# Patient Record
Sex: Male | Born: 1950
Health system: Southern US, Community
[De-identification: ages and names within clinical notes are randomized; demographics above are authoritative.]

## PROBLEM LIST (undated history)

## (undated) DIAGNOSIS — I10 Essential (primary) hypertension: Secondary | ICD-10-CM

## (undated) DIAGNOSIS — R0981 Nasal congestion: Secondary | ICD-10-CM

## (undated) DIAGNOSIS — R112 Nausea with vomiting, unspecified: Secondary | ICD-10-CM

## (undated) DIAGNOSIS — C787 Secondary malignant neoplasm of liver and intrahepatic bile duct: Principal | ICD-10-CM

## (undated) DIAGNOSIS — Z9889 Other specified postprocedural states: Secondary | ICD-10-CM

## (undated) DIAGNOSIS — Z7189 Other specified counseling: Secondary | ICD-10-CM

## (undated) DIAGNOSIS — Z9221 Personal history of antineoplastic chemotherapy: Secondary | ICD-10-CM

## (undated) DIAGNOSIS — C189 Malignant neoplasm of colon, unspecified: Principal | ICD-10-CM

## (undated) HISTORY — DX: Other specified counseling: Z71.89

## (undated) HISTORY — PX: OTHER SURGICAL HISTORY: SHX169

## (undated) HISTORY — DX: Secondary malignant neoplasm of liver and intrahepatic bile duct: C78.7

## (undated) HISTORY — DX: Malignant neoplasm of colon, unspecified: C18.9

---

## 1998-02-09 ENCOUNTER — Ambulatory Visit (HOSPITAL_BASED_OUTPATIENT_CLINIC_OR_DEPARTMENT_OTHER): Admission: RE | Admit: 1998-02-09 | Discharge: 1998-02-09 | Payer: Self-pay | Admitting: Surgery

## 2001-06-04 ENCOUNTER — Emergency Department (HOSPITAL_COMMUNITY): Admission: EM | Admit: 2001-06-04 | Discharge: 2001-06-04 | Payer: Self-pay | Admitting: Emergency Medicine

## 2001-06-04 ENCOUNTER — Encounter: Payer: Self-pay | Admitting: Emergency Medicine

## 2013-05-18 ENCOUNTER — Ambulatory Visit: Payer: BC Managed Care – PPO

## 2013-05-18 ENCOUNTER — Other Ambulatory Visit (HOSPITAL_BASED_OUTPATIENT_CLINIC_OR_DEPARTMENT_OTHER): Payer: BC Managed Care – PPO | Admitting: Lab

## 2013-05-18 ENCOUNTER — Ambulatory Visit (HOSPITAL_BASED_OUTPATIENT_CLINIC_OR_DEPARTMENT_OTHER): Payer: BC Managed Care – PPO | Admitting: Hematology & Oncology

## 2013-05-18 VITALS — BP 136/84 | HR 86 | Temp 98.5°F | Resp 18 | Ht 65.0 in | Wt 157.0 lb

## 2013-05-18 DIAGNOSIS — C787 Secondary malignant neoplasm of liver and intrahepatic bile duct: Secondary | ICD-10-CM

## 2013-05-18 DIAGNOSIS — C189 Malignant neoplasm of colon, unspecified: Secondary | ICD-10-CM

## 2013-05-18 DIAGNOSIS — C801 Malignant (primary) neoplasm, unspecified: Secondary | ICD-10-CM

## 2013-05-18 LAB — CBC WITH DIFFERENTIAL (CANCER CENTER ONLY)
Eosinophils Absolute: 0.1 10*3/uL (ref 0.0–0.5)
HCT: 42.8 % (ref 38.7–49.9)
HGB: 14.5 g/dL (ref 13.0–17.1)
LYMPH#: 0.9 10*3/uL (ref 0.9–3.3)
LYMPH%: 9.6 % — ABNORMAL LOW (ref 14.0–48.0)
MCV: 89 fL (ref 82–98)
MONO#: 0.8 10*3/uL (ref 0.1–0.9)
NEUT%: 80.8 % — ABNORMAL HIGH (ref 40.0–80.0)
RBC: 4.82 10*6/uL (ref 4.20–5.70)
RDW: 12.4 % (ref 11.1–15.7)
WBC: 9.6 10*3/uL (ref 4.0–10.0)

## 2013-05-18 LAB — CMP (CANCER CENTER ONLY)
ALT(SGPT): 26 U/L (ref 10–47)
AST: 49 U/L — ABNORMAL HIGH (ref 11–38)
Albumin: 3.5 g/dL (ref 3.3–5.5)
Alkaline Phosphatase: 258 U/L — ABNORMAL HIGH (ref 26–84)
BUN, Bld: 14 mg/dL (ref 7–22)
Calcium: 9.4 mg/dL (ref 8.0–10.3)
Chloride: 98 mEq/L (ref 98–108)
Glucose, Bld: 104 mg/dL (ref 73–118)
Potassium: 4.3 mEq/L (ref 3.3–4.7)
Sodium: 139 mEq/L (ref 128–145)
Total Protein: 8.3 g/dL — ABNORMAL HIGH (ref 6.4–8.1)

## 2013-05-18 MED ORDER — METOCLOPRAMIDE HCL 10 MG PO TABS: 10.0000 mg | ORAL_TABLET | Freq: Three times a day (TID) | ORAL | Status: DC

## 2013-05-18 NOTE — Progress Notes (Signed)
This office note has been dictated.

## 2013-05-19 LAB — CEA: CEA: 3455.2 ng/mL — ABNORMAL HIGH (ref 0.0–5.0)

## 2013-05-19 LAB — LACTATE DEHYDROGENASE: LDH: 690 U/L — ABNORMAL HIGH (ref 94–250)

## 2013-05-19 NOTE — Progress Notes (Signed)
CC:   Ryan Maynard, M.D.  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  HISTORY OF PRESENT ILLNESS:  Ryan Maynard is a very nice 62 year old white gentleman.  He is followed by Dr. Jacky Kindle.  Ryan Maynard has been very healthy in his whole life.  He really has had no medical issues.  He is working.  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  He has been exercising.  By about 3 months ago, he had a bout of shingles.  This was in the right T4-5 dermatome.  It is very painful.  He was put on some Valtrex.  He had a lot of postherpetic neuralgia.  This is slowly getting better on Neurontin now.  He then began to have a slight decrease in appetite.  He became more bloated.  He gets full very easily.  He was losing a little bit of weight.  He was not noticing any change in his bowels or bladder.  Again, he was not going to have bowel movements as he normally had.  He subsequently went to Dr. Jacky Kindle.  Dr. Jacky Kindle obviously knew something was wrong when he examined him as his liver was incredibly enlarged.  Dr. Jacky Kindle then sent Ryan Maynard over for a CT scan.  This was done locally at [facility].  Unfortunately, the CT scan showed extensive hepatic metastasis.  He had hepatomegaly.  He has paracaval lymph nodes. He has some pericardiac lymph nodes.  The pancreas looked okay. Gallbladder looked okay.  There was stool in the colon without any obvious mass.  Dr. Jacky Kindle kindly referred Ryan Maynard to the [facility].  Ryan Maynard still looks pretty good.  He is just shocked as to what is going on with him.  His wife is with him.  She actually has hepatitis C, but this is pretty quiet domain.  Ryan Maynard has had maybe some slight fevers.  He may have had some sweats.  Again, he has had no cough.  He has had no shortness of breath. He has had no leg swelling.  He has not noted any rashes.  Overall, his performance status is ECOG 1.  PAST MEDICAL HISTORY:   Remarkable only for hypertension.  ALLERGIES:  None.  MEDICATIONS: 1. Hydrochlorothiazide 12.5 mg p.o. daily. 2. Lotrel (5/20) 1 p.o. daily.  SOCIAL HISTORY:  Negative for tobacco use.  There is rare alcohol use. Again, he is working without any difficulties.  He has no obvious exposures.  FAMILY HISTORY:  Unremarkable.  He does not have any siblings.  REVIEW OF SYSTEMS:  As stated in history of present illness.  No additional findings are noted on a 12-system review.  PHYSICAL EXAMINATION:  General:  This is a fairly well-developed and well-nourished white gentleman, in no obvious distress.  He is alert and oriented x3.  Vital Signs:  Temperature of 98.5, pulse 86, respiratory rate 18, blood pressure 136/84.  Weight is 157 pounds.  Head and Neck: Normocephalic, atraumatic skull.  There are no ocular or oral lesions. There are no palpable, cervical, or supraclavicular lymph nodes.  Lungs: His lungs are clear bilaterally.  There are no rales, wheezes, or rhonchi.  Cardiac:  Regular rate and rhythm with a normal S1, S2.  There are no murmurs, rubs, or bruits.  Abdomen:  Soft.  Liver is incredibly distended.  His liver extends probably about 7 cm below the right costal margin.  The liver extends all the way across the midline to the  left costal margin.  Liver is firm.  There is no ascites.  There is no guarding or rebound tenderness.  Back:  No tenderness over the spine, ribs, or hips.  Extremities:  No clubbing, cyanosis, or edema.  He has good range motion of his joints.  He has good strength.  Skin:  No rashes, ecchymosis, or petechiae.  Neurological:  No focal neurological deficits.  LABORATORY STUDIES:  White cell count is 9.6, hemoglobin 13.5, hematocrit 43.8, platelet count 350.  His calcium is 9.4 with an albumin of 3.5.  His total protein is 8.3.  Alkaline phosphatase is 258. Bilirubin is 0.8.  IMPRESSION:  Ryan Maynard is a nice 62 year old gentleman.  He has extensive  hepatomegaly with metastasis.  The primary site at this point of time is unknown.  I would feel that metastatic colon cancer would be a high likelihood. Unfortunately, he has never had a colonoscopy.  He definitely needs one now.  One would think that if he had colon cancer, that he would be anemic. He certainly is not anemic.  I have already spoke with  Dr. Molly Maduro Bruschini.  Dr. Charlton Amor will get him in this week and we will plan for colonoscopy and possible upper endoscopy.  If his endoscopic procedures are negative, then we are going to have to do a biopsy of one of the liver mets to see exactly what this might be.  Then, I think it would be unusual if this would be a malignancy for another part of the GI tract.  Lung cancer will be a possibility.  He has not smoked for 40 years. Nothing was seen on the bases of the lungs on his recent CT scan.  He has no pulmonary type symptoms.  A hematologic malignancy would be highly unusual.  Lymphoma and Hodgkin disease, however, is certainly be possible.  Small cell or neuroendocrine tumor, I guess would be considered.  We will see what his CEA level is.  Ultimately, this is going to come down to a biopsy to confirm the histology.  Apparently, I talked to Ryan Maynard and his wife.  He is in good shape. If this is a metastatic colon cancer, he certainly would be able to tolerate chemotherapy.  I think that he would have a good chance of responding to chemotherapy.  If he did respond to chemotherapy, then he certainly would have a much better quality and quantity of life.  I told [name] that if he did not wish to have therapy or if the therapy did not work, then he likely was not looking at more than 3 or 4 months.  Of course, this is predicted on the basis of him having colon cancer.  I think any other cancer, if he did not respond to therapy, would be even worse.  I told Ryan Maynard and his wife that if he did  respond to treatment that he might be able to go a year and half to possibly 2 years.  I think the key with Ryan Maynard is his extensive tumor burden.  His liver is having a tough time.  His liver tests has not looked all that bad get, but Ryan Maynard is symptomatic.  I think a lot of his symptoms are referable to his hepatomegaly.  I am going to put him on some Reglan and we will see if this helps with some of the digestive issues.  I told him to take over-the-counter Pepcid at 2 pills a  day to see if this has not helped with stomach acid build up.  I spent a hour and half with Ryan Maynard and his wife.  I went over the CAT scans with him about the discovery from the [facility].  I did saw how bad his liver was and it will problem that we have with this malignancy.  Again, we need to get a biopsy.  Ryan Maynard, at first, did not want to do anything.  However, I told that because of his good performance status, he would have a good chance of prolong his life if he responded to chemotherapy.  After talking to him about this, he has decided to try a colonoscopy.  Again, if the colonoscopy is negative, then we will need to biopsy one of these liver lesions.  We will see what his CEA level is.  I will plan to get Ryan Maynard back depending on what we find with our endoscopy and biopsies.    ______________________________ Josph Macho, M.D. PRE/MEDQ  D:  05/18/2013  T:  05/19/2013  Job:  [id number]

## 2013-05-21 ENCOUNTER — Other Ambulatory Visit: Payer: Self-pay | Admitting: *Deleted

## 2013-05-21 DIAGNOSIS — C189 Malignant neoplasm of colon, unspecified: Secondary | ICD-10-CM

## 2013-05-21 MED ORDER — METOCLOPRAMIDE HCL 10 MG PO TABS: 10.0000 mg | ORAL_TABLET | Freq: Three times a day (TID) | ORAL | Status: DC

## 2013-05-24 ENCOUNTER — Telehealth: Payer: Self-pay | Admitting: Hematology & Oncology

## 2013-05-24 NOTE — Telephone Encounter (Signed)
I left a message for Ryan Maynard that I have the results back from the colon bx.  He does have colon ca.  The CEA is [id number].    Whatever he decides to do, I will help him out!  If he does NOT want any therapy, we will get Hospice.  If he does want therapy, we can arrange for this.  I told him to give me a call to let me know how I can help him and his girlfriend.  I am praying for him!!  Hewitt Shorts

## 2013-05-31 ENCOUNTER — Encounter (HOSPITAL_COMMUNITY): Payer: Self-pay

## 2013-06-07 ENCOUNTER — Ambulatory Visit (HOSPITAL_BASED_OUTPATIENT_CLINIC_OR_DEPARTMENT_OTHER): Payer: BC Managed Care – PPO | Admitting: Hematology & Oncology

## 2013-06-07 VITALS — BP 125/82 | HR 79 | Temp 98.4°F | Resp 18 | Ht 65.0 in | Wt 157.0 lb

## 2013-06-07 DIAGNOSIS — C787 Secondary malignant neoplasm of liver and intrahepatic bile duct: Secondary | ICD-10-CM

## 2013-06-07 DIAGNOSIS — C187 Malignant neoplasm of sigmoid colon: Secondary | ICD-10-CM

## 2013-06-07 DIAGNOSIS — C189 Malignant neoplasm of colon, unspecified: Secondary | ICD-10-CM

## 2013-06-07 NOTE — Patient Instructions (Signed)
Bevacizumab injection What is this medicine? BEVACIZUMAB (be va SIZ yoo mab) is a chemotherapy drug. It targets a protein found in many cancer cell types, and halts cancer growth. This drug treats many cancers including non-small cell lung cancer, and colon or rectal cancer. It is usually given with other chemotherapy drugs. This medicine may be used for other purposes; ask your health care provider or pharmacist if you have questions. COMMON BRAND NAME(S): Avastin What should I tell my health care provider before I take this medicine? They need to know if you have any of these conditions: -blood clots -heart disease, including heart failure, heart attack, or chest pain (angina) -high blood pressure -infection (especially a virus infection such as chickenpox, cold sores, or herpes) -kidney disease -lung disease -prior chemotherapy with doxorubicin, daunorubicin, epirubicin, or other anthracycline type chemotherapy agents -recent or ongoing radiation therapy -recent surgery -stroke -an unusual or allergic reaction to bevacizumab, hamster proteins, mouse proteins, other medicines, foods, dyes, or preservatives -pregnant or trying to get pregnant -breast-feeding How should I use this medicine? This medicine is for infusion into a vein. It is given by a health care professional in a hospital or clinic setting. Talk to your pediatrician regarding the use of this medicine in children. Special care may be needed. Overdosage: If you think you have taken too much of this medicine contact a poison control center or emergency room at once. NOTE: This medicine is only for you. Do not share this medicine with others. What if I miss a dose? It is important not to miss your dose. Call your doctor or health care professional if you are unable to keep an appointment. What may interact with this medicine? Interactions are not expected. This list may not describe all possible interactions. Give your health  care provider a list of all the medicines, herbs, non-prescription drugs, or dietary supplements you use. Also tell them if you smoke, drink alcohol, or use illegal drugs. Some items may interact with your medicine. What should I watch for while using this medicine? Your condition will be monitored carefully while you are receiving this medicine. You will need important blood work and urine testing done while you are taking this medicine. During your treatment, let your health care professional know if you have any unusual symptoms, such as difficulty breathing. This medicine may rarely cause 'gastrointestinal perforation' (holes in the stomach, intestines or colon), a serious side effect requiring surgery to repair. This medicine should be started at least 28 days following major surgery and the site of the surgery should be totally healed. Check with your doctor before scheduling dental work or surgery while you are receiving this treatment. Talk to your doctor if you have recently had surgery or if you have a wound that has not healed. Do not become pregnant while taking this medicine. Women should inform their doctor if they wish to become pregnant or think they might be pregnant. There is a potential for serious side effects to an unborn child. Talk to your health care professional or pharmacist for more information. Do not breast-feed an infant while taking this medicine. This medicine has caused ovarian failure in some women. This medicine may interfere with the ability to have a child. You should talk to your doctor or health care professional if you are concerned about your fertility. What side effects may I notice from receiving this medicine? Side effects that you should report to your doctor or health care professional as soon as possible: -  allergic reactions like skin rash, itching or hives, swelling of the face, lips, or tongue -signs of infection - fever or chills, cough, sore throat, pain  or trouble passing urine -signs of decreased platelets or bleeding - bruising, pinpoint red spots on the skin, black, tarry stools, nosebleeds, blood in the urine -breathing problems -changes in vision -chest pain -confusion -jaw pain, especially after dental work -mouth sores -seizures -severe abdominal pain -severe headache -sudden numbness or weakness of the face, arm or leg -swelling of legs or ankles -symptoms of a stroke: change in mental awareness, inability to talk or move one side of the body (especially in patients with lung cancer) -trouble passing urine or change in the amount of urine -trouble speaking or understanding -trouble walking, dizziness, loss of balance or coordination Side effects that usually do not require medical attention (report to your doctor or health care professional if they continue or are bothersome): -constipation -diarrhea -dry skin -headache -loss of appetite -nausea, vomiting This list may not describe all possible side effects. Call your doctor for medical advice about side effects. You may report side effects to FDA at 1-800-FDA-1088. Where should I keep my medicine? This drug is given in a hospital or clinic and will not be stored at home. NOTE: This sheet is a summary. It may not cover all possible information. If you have questions about this medicine, talk to your doctor, pharmacist, or health care provider.  2014, Elsevier/Gold Standard. (2010-04-20 16:25:37)   Leucovorin injection What is this medicine? LEUCOVORIN (loo koe VOR in) is used to prevent or treat the harmful effects of some medicines. This medicine is used to treat anemia caused by a low amount of folic acid in the body. It is also used with 5-fluorouracil (5-FU) to treat colon cancer. This medicine may be used for other purposes; ask your health care provider or pharmacist if you have questions. What should I tell my health care provider before I take this medicine? They  need to know if you have any of these conditions: -anemia from low levels of vitamin B-12 in the blood -an unusual or allergic reaction to leucovorin, folic acid, other medicines, foods, dyes, or preservatives -pregnant or trying to get pregnant -breast-feeding How should I use this medicine? This medicine is for injection into a muscle or into a vein. It is given by a health care professional in a hospital or clinic setting. Talk to your pediatrician regarding the use of this medicine in children. Special care may be needed. Overdosage: If you think you have taken too much of this medicine contact a poison control center or emergency room at once. NOTE: This medicine is only for you. Do not share this medicine with others. What if I miss a dose? This does not apply. What may interact with this medicine? -capecitabine -fluorouracil -phenobarbital -phenytoin -primidone -trimethoprim-sulfamethoxazole This list may not describe all possible interactions. Give your health care provider a list of all the medicines, herbs, non-prescription drugs, or dietary supplements you use. Also tell them if you smoke, drink alcohol, or use illegal drugs. Some items may interact with your medicine. What should I watch for while using this medicine? Your condition will be monitored carefully while you are receiving this medicine. This medicine may increase the side effects of 5-fluorouracil, 5-FU. Tell your doctor or health care professional if you have diarrhea or mouth sores that do not get better or that get worse. What side effects may I notice from receiving this medicine?  Side effects that you should report to your doctor or health care professional as soon as possible: -allergic reactions like skin rash, itching or hives, swelling of the face, lips, or tongue -breathing problems -fever, infection -mouth sores -unusual bleeding or bruising -unusually weak or tired Side effects that usually do not  require medical attention (report to your doctor or health care professional if they continue or are bothersome): -constipation or diarrhea -loss of appetite -nausea, vomiting This list may not describe all possible side effects. Call your doctor for medical advice about side effects. You may report side effects to FDA at 1-800-FDA-1088. Where should I keep my medicine? This drug is given in a hospital or clinic and will not be stored at home. NOTE: This sheet is a summary. It may not cover all possible information. If you have questions about this medicine, talk to your doctor, pharmacist, or health care provider.  2014, Elsevier/Gold Standard. (2007-11-24 16:50:29)   Oxaliplatin Injection What is this medicine? OXALIPLATIN (ox AL i PLA tin) is a chemotherapy drug. It targets fast dividing cells, like cancer cells, and causes these cells to die. This medicine is used to treat cancers of the colon and rectum, and many other cancers. This medicine may be used for other purposes; ask your health care provider or pharmacist if you have questions. COMMON BRAND NAME(S): Eloxatin What should I tell my health care provider before I take this medicine? They need to know if you have any of these conditions: -kidney disease -an unusual or allergic reaction to oxaliplatin, other chemotherapy, other medicines, foods, dyes, or preservatives -pregnant or trying to get pregnant -breast-feeding How should I use this medicine? This drug is given as an infusion into a vein. It is administered in a hospital or clinic by a specially trained health care professional. Talk to your pediatrician regarding the use of this medicine in children. Special care may be needed. Overdosage: If you think you have taken too much of this medicine contact a poison control center or emergency room at once. NOTE: This medicine is only for you. Do not share this medicine with others. What if I miss a dose? It is important not  to miss a dose. Call your doctor or health care professional if you are unable to keep an appointment. What may interact with this medicine? -medicines to increase blood counts like filgrastim, pegfilgrastim, sargramostim -probenecid -some antibiotics like amikacin, gentamicin, neomycin, polymyxin B, streptomycin, tobramycin -zalcitabine Talk to your doctor or health care professional before taking any of these medicines: -acetaminophen -aspirin -ibuprofen -ketoprofen -naproxen This list may not describe all possible interactions. Give your health care provider a list of all the medicines, herbs, non-prescription drugs, or dietary supplements you use. Also tell them if you smoke, drink alcohol, or use illegal drugs. Some items may interact with your medicine. What should I watch for while using this medicine? Your condition will be monitored carefully while you are receiving this medicine. You will need important blood work done while you are taking this medicine. This medicine can make you more sensitive to cold. Do not drink cold drinks or use ice. Cover exposed skin before coming in contact with cold temperatures or cold objects. When out in cold weather wear warm clothing and cover your mouth and nose to warm the air that goes into your lungs. Tell your doctor if you get sensitive to the cold. This drug may make you feel generally unwell. This is not uncommon, as chemotherapy can affect  healthy cells as well as cancer cells. Report any side effects. Continue your course of treatment even though you feel ill unless your doctor tells you to stop. In some cases, you may be given additional medicines to help with side effects. Follow all directions for their use. Call your doctor or health care professional for advice if you get a fever, chills or sore throat, or other symptoms of a cold or flu. Do not treat yourself. This drug decreases your body's ability to fight infections. Try to avoid being  around people who are sick. This medicine may increase your risk to bruise or bleed. Call your doctor or health care professional if you notice any unusual bleeding. Be careful brushing and flossing your teeth or using a toothpick because you may get an infection or bleed more easily. If you have any dental work done, tell your dentist you are receiving this medicine. Avoid taking products that contain aspirin, acetaminophen, ibuprofen, naproxen, or ketoprofen unless instructed by your doctor. These medicines may hide a fever. Do not become pregnant while taking this medicine. Women should inform their doctor if they wish to become pregnant or think they might be pregnant. There is a potential for serious side effects to an unborn child. Talk to your health care professional or pharmacist for more information. Do not breast-feed an infant while taking this medicine. Call your doctor or health care professional if you get diarrhea. Do not treat yourself. What side effects may I notice from receiving this medicine? Side effects that you should report to your doctor or health care professional as soon as possible: -allergic reactions like skin rash, itching or hives, swelling of the face, lips, or tongue -low blood counts - This drug may decrease the number of white blood cells, red blood cells and platelets. You may be at increased risk for infections and bleeding. -signs of infection - fever or chills, cough, sore throat, pain or difficulty passing urine -signs of decreased platelets or bleeding - bruising, pinpoint red spots on the skin, black, tarry stools, nosebleeds -signs of decreased red blood cells - unusually weak or tired, fainting spells, lightheadedness -breathing problems -chest pain, pressure -cough -diarrhea -jaw tightness -mouth sores -nausea and vomiting -pain, swelling, redness or irritation at the injection site -pain, tingling, numbness in the hands or feet -problems with  balance, talking, walking -redness, blistering, peeling or loosening of the skin, including inside the mouth -trouble passing urine or change in the amount of urine Side effects that usually do not require medical attention (report to your doctor or health care professional if they continue or are bothersome): -changes in vision -constipation -hair loss -loss of appetite -metallic taste in the mouth or changes in taste -stomach pain This list may not describe all possible side effects. Call your doctor for medical advice about side effects. You may report side effects to FDA at 1-800-FDA-1088. Where should I keep my medicine? This drug is given in a hospital or clinic and will not be stored at home. NOTE: This sheet is a summary. It may not cover all possible information. If you have questions about this medicine, talk to your doctor, pharmacist, or health care provider.  2014, Elsevier/Gold Standard. (2007-12-15 17:22:47)

## 2013-06-07 NOTE — Progress Notes (Signed)
This office note has been dictated.

## 2013-06-08 NOTE — Progress Notes (Signed)
CC:   Ryan Bunting, MD Ryan Maynard, M.D.  DIAGNOSIS:  Metastatic colon cancer-extensive liver metastasis.  CURRENT THERAPY:  Observation.  INTERIM HISTORY:  Ryan Maynard comes in for a second office visit.  I first saw him back on December 16th.  At that point in time, we did not have a diagnosis.  We did some studies on him.  His CEA was 3455.  His LDH was 690.  Pre-albumin was 11.2.  His alkaline phosphatase was 258.  He underwent a colonoscopy by Dr. Cristina Gong.  Dr. Cristina Gong found a mass in the sigmoid colon.  This was biopsied and found to be adenocarcinoma.  We did do a KRAS analysis.  The KRAS was wild type.  He and his girlfriend are now getting married.  They have been dating for 31 years.  They will be married this Saturday.  He still feels okay.  He is still having problems with bloating.  I did give him some Reglan.  He did not take the Reglan because he read about the side effects.  I reassured him about the side effects would have been in less than 5% of patients.  He has had no cough.  He has had no shortness of breath.  There has been no bleeding.  Overall, his performance status is ECOG 1.  PHYSICAL EXAMINATION:  General:  This is a fairly well developed, well- nourished white gentleman in no obvious distress.  Vital Signs:  Show a temperature of 98.4, pulse of 79, respiratory rate 18, blood pressure 125/82.  Weight is 157 pounds.  Head and Neck:  Shows a normocephalic, atraumatic skull.  There are no ocular or oral lesions.  There are no palpable cervical or supraclavicular lymph nodes.  Lungs:  Clear bilaterally.  Cardiac:  Regular rate and rhythm with a normal S1, S2. There are no murmurs, rubs, or bruits.  Abdomen:  Soft.  He has hepatomegaly.  His liver extends down about 5 cm and extends across the midline to the left costal margin.  The liver edge is smooth.  Again, there is no ascites.  I cannot palpate a spleen.  Back:  No tenderness over the  spine, ribs, or hips.  Extremities:  Show no clubbing, cyanosis, or edema.  Neurological:  Shows no focal neurological deficits.  LABORATORY STUDIES:  Were not done in this visit.  IMPRESSION:  Ryan Maynard is a nice 63 year old gentleman.  He has metastatic colorectal cancer.  He never had a colonoscopy before.  I spend a good hour with Ryan Maynard and his bride to be.  I explained to him his situation.  I told that he was certainly strong enough that he could tolerate chemotherapy.  I told him that in his situation, the chance of chemotherapy working should be over 50%.  I did give him information sheets for 5-FU, oxaliplatin, and Avastin.  I explained to him side effects of chemotherapy.  Ryan Maynard clearly is making his quality of life a priority.  I certainly agree with this.  I told him that if chemotherapy worked, then his survival could certainly be an year and a half to 2 years.  I have seen some recent studies that are incorporating intrahepatic therapy with radioisotope beads in addition to systemic chemotherapy. This, I find to be quite interesting.  I told Ryan Maynard that without treatment, I thought his prognosis would be no more than 4 months.  I explained to him and his fiancee how I felt he would progress.  I told him that a liver failure would be his ultimate event.  Again, I talked to Ryan Maynard and his wife for a good hour or so.  I tried to layout what I felt would happen to him in the future if he did not take treatment.  I was very honest with him and his fiancee.  He very much appreciated this.  I think that he needs hospice regardless.  We will go ahead and make that referral.  He agrees.  I did not discuss code status with him.  We certainly will do this at the appropriate time when we see him back.  I will not make the followup appointment for Ryan Maynard as of yet.  He will call me, he says when he and his wife decide what to do.  He gets  married this Saturday.  He certainly does not want to do anything after he gets his wedding and honeymoon taken care of.    ______________________________ Volanda Napoleon, M.D. PRE/MEDQ  D:  06/07/2013  T:  06/08/2013  Job:  [id number]

## 2013-06-08 NOTE — Telephone Encounter (Signed)
Re-issued Reglan rx as it was sent to the wrong pharmacy in [place].

## 2013-06-14 ENCOUNTER — Other Ambulatory Visit: Payer: Self-pay | Admitting: Nurse Practitioner

## 2013-06-14 ENCOUNTER — Other Ambulatory Visit: Payer: Self-pay | Admitting: Hematology & Oncology

## 2013-06-14 DIAGNOSIS — C189 Malignant neoplasm of colon, unspecified: Secondary | ICD-10-CM

## 2013-06-14 DIAGNOSIS — C787 Secondary malignant neoplasm of liver and intrahepatic bile duct: Principal | ICD-10-CM

## 2013-06-14 MED ORDER — PEG 3350-KCL-NABCB-NACL-NASULF 236 G PO SOLR: 4000.0000 mL | Freq: Once | ORAL | Status: DC

## 2013-06-15 ENCOUNTER — Other Ambulatory Visit: Payer: Self-pay | Admitting: Hematology & Oncology

## 2013-06-15 ENCOUNTER — Encounter: Payer: Self-pay | Admitting: Hematology & Oncology

## 2013-06-15 DIAGNOSIS — C189 Malignant neoplasm of colon, unspecified: Secondary | ICD-10-CM | POA: Insufficient documentation

## 2013-06-15 DIAGNOSIS — C787 Secondary malignant neoplasm of liver and intrahepatic bile duct: Principal | ICD-10-CM

## 2013-06-15 HISTORY — DX: Malignant neoplasm of colon, unspecified: C18.9

## 2013-06-16 ENCOUNTER — Telehealth: Payer: Self-pay | Admitting: Hematology & Oncology

## 2013-06-16 NOTE — Telephone Encounter (Signed)
Pt aware of 1-19 MD, 1-20 Port, 1-21 edu. He is suppose to call me to schedule which day he wants to start chemo

## 2013-06-17 ENCOUNTER — Other Ambulatory Visit: Payer: Self-pay | Admitting: Radiology

## 2013-06-18 ENCOUNTER — Other Ambulatory Visit: Payer: Self-pay | Admitting: Radiology

## 2013-06-21 ENCOUNTER — Other Ambulatory Visit (HOSPITAL_BASED_OUTPATIENT_CLINIC_OR_DEPARTMENT_OTHER): Payer: BC Managed Care – PPO | Admitting: Lab

## 2013-06-21 ENCOUNTER — Ambulatory Visit (HOSPITAL_BASED_OUTPATIENT_CLINIC_OR_DEPARTMENT_OTHER): Payer: BC Managed Care – PPO | Admitting: Hematology & Oncology

## 2013-06-21 ENCOUNTER — Encounter: Payer: Self-pay | Admitting: Hematology & Oncology

## 2013-06-21 VITALS — BP 120/69 | HR 89 | Temp 98.7°F | Resp 18 | Ht 65.0 in | Wt 155.0 lb

## 2013-06-21 DIAGNOSIS — C189 Malignant neoplasm of colon, unspecified: Secondary | ICD-10-CM

## 2013-06-21 DIAGNOSIS — C787 Secondary malignant neoplasm of liver and intrahepatic bile duct: Secondary | ICD-10-CM

## 2013-06-21 DIAGNOSIS — K5900 Constipation, unspecified: Secondary | ICD-10-CM

## 2013-06-21 DIAGNOSIS — C187 Malignant neoplasm of sigmoid colon: Secondary | ICD-10-CM

## 2013-06-21 LAB — CBC WITH DIFFERENTIAL (CANCER CENTER ONLY)
BASO#: 0.1 10*3/uL (ref 0.0–0.2)
BASO%: 0.8 % (ref 0.0–2.0)
EOS ABS: 0.1 10*3/uL (ref 0.0–0.5)
EOS%: 0.5 % (ref 0.0–7.0)
HCT: 42.6 % (ref 38.7–49.9)
HGB: 14.1 g/dL (ref 13.0–17.1)
LYMPH#: 1.2 10*3/uL (ref 0.9–3.3)
LYMPH%: 10.4 % — ABNORMAL LOW (ref 14.0–48.0)
MCH: 29.6 pg (ref 28.0–33.4)
MCHC: 33.1 g/dL (ref 32.0–35.9)
MCV: 89 fL (ref 82–98)
MONO#: 1.3 10*3/uL — ABNORMAL HIGH (ref 0.1–0.9)
MONO%: 10.5 % (ref 0.0–13.0)
NEUT#: 9.3 10*3/uL — ABNORMAL HIGH (ref 1.5–6.5)
NEUT%: 77.8 % (ref 40.0–80.0)
PLATELETS: 338 10*3/uL (ref 145–400)
RBC: 4.77 10*6/uL (ref 4.20–5.70)
RDW: 12.8 % (ref 11.1–15.7)
WBC: 11.9 10*3/uL — ABNORMAL HIGH (ref 4.0–10.0)

## 2013-06-21 LAB — CMP (CANCER CENTER ONLY)
ALT(SGPT): 27 U/L (ref 10–47)
AST: 51 U/L — ABNORMAL HIGH (ref 11–38)
Albumin: 3.3 g/dL (ref 3.3–5.5)
Alkaline Phosphatase: 267 U/L — ABNORMAL HIGH (ref 26–84)
BILIRUBIN TOTAL: 0.9 mg/dL (ref 0.20–1.60)
BUN, Bld: 15 mg/dL (ref 7–22)
CO2: 28 mEq/L (ref 18–33)
Calcium: 9.4 mg/dL (ref 8.0–10.3)
Chloride: 100 mEq/L (ref 98–108)
Creat: 0.7 mg/dl (ref 0.6–1.2)
GLUCOSE: 91 mg/dL (ref 73–118)
Potassium: 4 mEq/L (ref 3.3–4.7)
SODIUM: 136 meq/L (ref 128–145)
Total Protein: 8.1 g/dL (ref 6.4–8.1)

## 2013-06-21 MED ORDER — ONDANSETRON HCL 8 MG PO TABS: 8.0000 mg | ORAL_TABLET | Freq: Two times a day (BID) | ORAL | Status: DC

## 2013-06-21 MED ORDER — LACTULOSE 20 GM/30ML PO SOLN: 20.0000 g | Freq: Four times a day (QID) | ORAL | Status: DC | PRN

## 2013-06-21 MED ORDER — LORAZEPAM 0.5 MG PO TABS: 0.5000 mg | ORAL_TABLET | Freq: Four times a day (QID) | ORAL | Status: DC | PRN

## 2013-06-21 MED ORDER — DEXAMETHASONE 4 MG PO TABS: 8.0000 mg | ORAL_TABLET | Freq: Two times a day (BID) | ORAL | Status: DC

## 2013-06-21 MED ORDER — PROCHLORPERAZINE MALEATE 10 MG PO TABS: 10.0000 mg | ORAL_TABLET | Freq: Four times a day (QID) | ORAL | Status: DC | PRN

## 2013-06-21 NOTE — Progress Notes (Signed)
This office note has been dictated.

## 2013-06-22 ENCOUNTER — Other Ambulatory Visit: Payer: Self-pay | Admitting: Hematology & Oncology

## 2013-06-22 ENCOUNTER — Ambulatory Visit (HOSPITAL_COMMUNITY)
Admission: RE | Admit: 2013-06-22 | Discharge: 2013-06-22 | Disposition: A | Discharge status: Home / Self Care | Payer: BC Managed Care – PPO | Source: Ambulatory Visit | Attending: Hematology & Oncology | Admitting: Hematology & Oncology

## 2013-06-22 ENCOUNTER — Encounter (HOSPITAL_COMMUNITY): Payer: Self-pay

## 2013-06-22 DIAGNOSIS — C787 Secondary malignant neoplasm of liver and intrahepatic bile duct: Secondary | ICD-10-CM | POA: Insufficient documentation

## 2013-06-22 DIAGNOSIS — C189 Malignant neoplasm of colon, unspecified: Secondary | ICD-10-CM | POA: Insufficient documentation

## 2013-06-22 LAB — CBC
HEMATOCRIT: 42.2 % (ref 39.0–52.0)
Hemoglobin: 14.1 g/dL (ref 13.0–17.0)
MCH: 29.6 pg (ref 26.0–34.0)
MCHC: 33.4 g/dL (ref 30.0–36.0)
MCV: 88.5 fL (ref 78.0–100.0)
Platelets: 391 10*3/uL (ref 150–400)
RBC: 4.77 MIL/uL (ref 4.22–5.81)
RDW: 12.6 % (ref 11.5–15.5)
WBC: 11.9 10*3/uL — AB (ref 4.0–10.5)

## 2013-06-22 LAB — BASIC METABOLIC PANEL
BUN: 15 mg/dL (ref 6–23)
CHLORIDE: 97 meq/L (ref 96–112)
CO2: 25 mEq/L (ref 19–32)
Calcium: 9.5 mg/dL (ref 8.4–10.5)
Creatinine, Ser: 0.68 mg/dL (ref 0.50–1.35)
GFR calc Af Amer: >90 mL/min (ref >90)
GFR calc non Af Amer: >90 mL/min (ref >90)
GLUCOSE: 89 mg/dL (ref 70–99)
Potassium: 4.4 mEq/L (ref 3.7–5.3)
Sodium: 136 mEq/L — ABNORMAL LOW (ref 137–147)

## 2013-06-22 LAB — PROTIME-INR
INR: 1.08 (ref 0.00–1.49)
Prothrombin Time: 13.8 seconds (ref 11.6–15.2)

## 2013-06-22 LAB — APTT: aPTT: 37 seconds (ref 24–37)

## 2013-06-22 LAB — CEA: CEA: 4602.9 ng/mL — ABNORMAL HIGH (ref 0.0–5.0)

## 2013-06-22 MED ORDER — MIDAZOLAM HCL 2 MG/2ML IJ SOLN
INTRAMUSCULAR | Status: AC
  Filled 2013-06-22: qty 6

## 2013-06-22 MED ORDER — LIDOCAINE HCL 1 % IJ SOLN
INTRAMUSCULAR | Status: AC
  Filled 2013-06-22: qty 20

## 2013-06-22 MED ORDER — HEPARIN SOD (PORK) LOCK FLUSH 100 UNIT/ML IV SOLN
INTRAVENOUS | Status: AC
  Filled 2013-06-22: qty 5

## 2013-06-22 MED ORDER — CEFAZOLIN SODIUM-DEXTROSE 2-3 GM-% IV SOLR
2.0000 g | Freq: Once | INTRAVENOUS | Status: AC
  Administered 2013-06-22: 2 g via INTRAVENOUS
  Filled 2013-06-22: qty 50

## 2013-06-22 MED ORDER — FENTANYL CITRATE 0.05 MG/ML IJ SOLN
INTRAMUSCULAR | Status: AC
  Filled 2013-06-22: qty 6

## 2013-06-22 MED ORDER — SODIUM CHLORIDE 0.9 % IV SOLN
Freq: Once | INTRAVENOUS | Status: AC
  Administered 2013-06-22: 20 mL/h via INTRAVENOUS

## 2013-06-22 MED ORDER — FENTANYL CITRATE 0.05 MG/ML IJ SOLN
INTRAMUSCULAR | Status: AC | PRN
  Administered 2013-06-22 (×2): 50 ug via INTRAVENOUS

## 2013-06-22 MED ORDER — MIDAZOLAM HCL 2 MG/2ML IJ SOLN
INTRAMUSCULAR | Status: AC | PRN
  Administered 2013-06-22: 1 mg via INTRAVENOUS
  Administered 2013-06-22: 0.5 mg via INTRAVENOUS
  Administered 2013-06-22: 1 mg via INTRAVENOUS
  Administered 2013-06-22: 0.5 mg via INTRAVENOUS
  Administered 2013-06-22: 1 mg via INTRAVENOUS

## 2013-06-22 MED ORDER — HEPARIN SOD (PORK) LOCK FLUSH 100 UNIT/ML IV SOLN
INTRAVENOUS | Status: AC | PRN
  Administered 2013-06-22: 500 [IU]

## 2013-06-22 NOTE — Discharge Instructions (Signed)
Implanted Port Home Guide °An implanted port is a type of central line that is placed under the skin. Central lines are used to provide IV access when treatment or nutrition needs to be given through a person's veins. Implanted ports are used for long-term IV access. An implanted port may be placed because:  °· You need IV medicine that would be irritating to the small veins in your hands or arms.   °· You need long-term IV medicines, such as antibiotics.   °· You need IV nutrition for a long period.   °· You need frequent blood draws for lab tests.   °· You need dialysis.   °Implanted ports are usually placed in the chest area, but they can also be placed in the upper arm, the abdomen, or the leg. An implanted port has two main parts:  °· Reservoir. The reservoir is round and will appear as a small, raised area under your skin. The reservoir is the part where a needle is inserted to give medicines or draw blood.   °· Catheter. The catheter is a thin, flexible tube that extends from the reservoir. The catheter is placed into a large vein. Medicine that is inserted into the reservoir goes into the catheter and then into the vein.   °HOW WILL I CARE FOR MY INCISION SITE? °Do not get the incision site wet. Bathe or shower as directed by your health care provider.  °HOW IS MY PORT ACCESSED? °Special steps must be taken to access the port:  °· Before the port is accessed, a numbing cream can be placed on the skin. This helps numb the skin over the port site.   °· Your health care provider uses a sterile technique to access the port. °· Your health care provider must put on a mask and sterile gloves. °· The skin over your port is cleaned carefully with an antiseptic and allowed to dry. °· The port is gently pinched between sterile gloves, and a needle is inserted into the port. °· Only "non-coring" port needles should be used to access the port. Once the port is accessed, a blood return should be checked. This helps  ensure that the port is in the vein and is not clogged.   °· If your port needs to remain accessed for a constant infusion, a clear (transparent) bandage will be placed over the needle site. The bandage and needle will need to be changed every week, or as directed by your health care provider.   °· Keep the bandage covering the needle clean and dry. Do not get it wet. Follow your health care provider's instructions on how to take a shower or bath while the port is accessed.   °· If your port does not need to stay accessed, no bandage is needed over the port.   °WHAT IS FLUSHING? °Flushing helps keep the port from getting clogged. Follow your health care provider's instructions on how and when to flush the port. Ports are usually flushed with saline solution or a medicine called heparin. The need for flushing will depend on how the port is used.  °· If the port is used for intermittent medicines or blood draws, the port will need to be flushed:   °· After medicines have been given.   °· After blood has been drawn.   °· As part of routine maintenance.   °· If a constant infusion is running, the port may not need to be flushed.   °HOW LONG WILL MY PORT STAY IMPLANTED? °The port can stay in for as long as your health care   provider thinks it is needed. When it is time for the port to come out, surgery will be done to remove it. The procedure is similar to the one performed when the port was put in.  °WHEN SHOULD I SEEK IMMEDIATE MEDICAL CARE? °When you have an implanted port, you should seek immediate medical care if:  °· You notice a bad smell coming from the incision site.   °· You have swelling, redness, or drainage at the incision site.   °· You have more swelling or pain at the port site or the surrounding area.   °· You have a fever that is not controlled with medicine. °Document Released: 05/20/2005 Document Revised: 03/10/2013 Document Reviewed: 01/25/2013 °ExitCare® Patient Information ©2014 [employer]. °Moderate Sedation, Adult °Moderate sedation is given to help you relax or even sleep through a procedure. You may remain sleepy, be clumsy, or have poor balance for several hours following this procedure. Arrange for a responsible adult, family member, or friend to take you home. A responsible adult should stay with you for at least 24 hours or until the medicines have worn off. °· Do not participate in any activities where you could become injured for the next 24 hours, or until you feel normal again. Do not: °· Drive. °· Swim. °· Ride a bicycle. °· Operate heavy machinery. °· Cook. °· Use power tools. °· Climb ladders. °· Work at heights. °· Do not make important decisions or sign legal documents until you are improved. °· Vomiting may occur if you eat too soon. When you can drink without vomiting, try water, juice, or soup. Try solid foods if you feel little or no nausea. °· Only take over-the-counter or prescription medications for pain, discomfort, or fever as directed by your caregiver.If pain medications have been prescribed for you, ask your caregiver how soon it is safe to take them. °· Make sure you and your family fully understands everything about the medication given to you. Make sure you understand what side effects may occur. °· You should not drink alcohol, take sleeping pills, or medications that cause drowsiness for at least 24 hours. °· If you smoke, do not smoke alone. °· If you are feeling better, you may resume normal activities 24 hours after receiving sedation. °· Keep all appointments as scheduled. Follow all instructions. °· Ask questions if you do not understand. °SEEK MEDICAL CARE IF:  °· Your skin is pale or bluish in color. °· You continue to feel sick to your stomach (nauseous) or throw up (vomit). °· Your pain is getting worse and not helped by medication. °· You have bleeding or swelling. °· You are still sleepy or feeling clumsy after 24 hours. °SEEK IMMEDIATE MEDICAL CARE IF:   °· You develop a rash. °· You have difficulty breathing. °· You develop any type of allergic problem. °· You have a fever. °Document Released: 02/12/2001 Document Revised: 08/12/2011 Document Reviewed: 01/25/2013 °ExitCare® Patient Information ©2014 [employer]. ° °

## 2013-06-22 NOTE — Procedures (Signed)
RIJV PAC Tip SVC RA No comp 

## 2013-06-22 NOTE — Progress Notes (Signed)
DIAGNOSIS:  Metastatic colon cancer.  CURRENT THERAPY:  The patient to start FOLFOX with Avastin next week.  INTERIM HISTORY:  [name] comes in for followup.  He is still having issues with going to the bathroom.  He is still having constipation issues.  Again, I had to think a lot of this is from his underlying malignancy with his hepatomegaly.  We will go ahead and put him on some lactulose to see if this does not help.  He now is married.  He got married a couple of weeks ago.  He and his wife went down to [facility] for a week and had a nice time.  When we first saw [name], his CEA was 3455.  Hopefully, this would be a good way for [place] to assess his disease.  Again, he will get his Port-A-Cath in tomorrow.  His chemotherapy will start next week.  He has had pain issues.  He has had no leg swelling issues.  He has had no fevers or sweats.  PHYSICAL EXAMINATION:  General:  This is a well-developed, well- nourished white gentleman, in no obvious distress.  Vital Signs: Temperature of 98.7, pulse 89, respiratory rate 18, blood pressure 120/69.  Weight is 155 pounds.  Head and Neck:  No ocular or oral lesions.  There is no scleral icterus.  There is no adenopathy in the neck.  Lungs:  Clear.  Cardiac:  Regular rate and rhythm with no murmurs, rubs, or bruits.  Abdomen:  Soft.  He has a hepatomegaly. There is no __________ ascites.  His liver edge is probably about 7 to 8 cm below the right costal margin.  The liver edge extended across to the midline to the left costal margin.  Back:  No tenderness over the spine, ribs, or hips.  Extremities:  No clubbing, cyanosis, or edema.  LABORATORY DATA:  Labs show white cell count 11.9, hemoglobin 14.1, hematocrit 42.6, platelet count 338.  Alkaline phosphatase is 267.  His prealbumin was 11.2.  IMPRESSION:  [name] is a 63 year old gentleman with metastatic colon cancer.  He knows __________ that treatment will hopefully  improve his symptoms.  He knows that he is not going to be cured by treatment.  I would like to think that with treatment, he will respond and that his bowel issues will improve.  Again, I gave him a prescription for lactulose.  We will plan to get him back to see [place] in probably about 3 weeks.  He will be here next week for his first cycle of chemotherapy.    ______________________________ Volanda Napoleon, M.D. PRE/MEDQ  D:  06/21/2013  T:  06/22/2013  Job:  [id number]

## 2013-06-22 NOTE — H&P (Signed)
Chief Complaint: "I'm here for a portacath" Referring Physician:Ennever HPI: Ryan Maynard is an 63 y.o. male with metastatic colon cancer. He is to start chemotherapy soon and is scheduled today for port placement. PMHx and meds reviewed. Denies fevers, chills, recent illness. No CP, SOB, cough, dysuria.   Past Medical History:  Past Medical History  Diagnosis Date  . Colon cancer metastasized to liver 06/15/2013    Past Surgical History: History reviewed. No pertinent past surgical history.  Family History: History reviewed. No pertinent family history.  Social History:  reports that he has never smoked. He has never used smokeless tobacco. His alcohol and drug histories are not on file.  Allergies: No Known Allergies  Medications:   Medication List    ASK your doctor about these medications       amLODipine-benazepril 5-20 MG per capsule  Commonly known as:  LOTREL  Take 1 capsule by mouth daily.     Coenzyme Q10 10 MG capsule  Take 10 mg by mouth daily.     dexamethasone 4 MG tablet  Commonly known as:  DECADRON  Take 2 tablets (8 mg total) by mouth 2 (two) times daily with a meal. Take daily starting the day after chemotherapy for 2 days. Take with food.     gabapentin 100 MG capsule  Commonly known as:  NEURONTIN  Take 100 mg by mouth 3 (three) times daily.     Lactulose 20 GM/30ML Soln  Take 30 mLs (20 g total) by mouth 4 (four) times daily as needed.     LORazepam 0.5 MG tablet  Commonly known as:  ATIVAN  Take 1 tablet (0.5 mg total) by mouth every 6 (six) hours as needed (Nausea or vomiting).     ondansetron 8 MG tablet  Commonly known as:  ZOFRAN  Take 1 tablet (8 mg total) by mouth 2 (two) times daily. Take two times a day starting the day after chemo for 2 days. Then take two times a day as needed for nausea or vomiting.     prochlorperazine 10 MG tablet  Commonly known as:  COMPAZINE  Take 1 tablet (10 mg total) by mouth every 6 (six) hours as needed  (Nausea or vomiting).        Please HPI for pertinent positives, otherwise complete 10 system ROS negative.  Physical Exam: BP 132/77  Pulse 81  Temp(Src) 98.5 F (36.9 C) (Oral)  Resp 18  SpO2 96% There is no weight on file to calculate BMI.   General Appearance:  Alert, cooperative, no distress, appears stated age  Head:  Normocephalic, without obvious abnormality, atraumatic  ENT: Unremarkable  Neck: Supple, symmetrical, trachea midline  Lungs:   Clear to auscultation bilaterally, no w/r/r.  Chest Wall:  No tenderness or deformity  Heart:  Regular rate and rhythm, S1, S2 normal, no murmur, rub or gallop.  Abdomen:   Soft, non-tender, non distended.  Extremities: Extremities normal, atraumatic, no cyanosis or edema  Pulses: 2+ and symmetric  Neurologic: Normal affect, no gross deficits.   Results for orders placed during the hospital encounter of 06/22/13 (from the past 48 hour(s))  PROTIME-INR     Status: None   Collection Time    06/22/13  8:00 AM      Result Value Range   Prothrombin Time 13.8  11.6 - 15.2 seconds   INR 1.08  0.00 - 1.49  APTT     Status: None   Collection Time    06/22/13  8:00 AM      Result Value Range   aPTT 37  24 - 37 seconds   Comment:            IF BASELINE aPTT IS ELEVATED,     SUGGEST PATIENT RISK ASSESSMENT     BE USED TO DETERMINE APPROPRIATE     ANTICOAGULANT THERAPY.   CBC    Component Value Date/Time   WBC 11.9* 06/21/2013 1155   HGB 14.1 06/21/2013 1155   HCT 42.6 06/21/2013 1155   PLT 338 06/21/2013 1155   MCV 89 06/21/2013 1155   MCH 29.6 06/21/2013 1155   MCHC 33.1 06/21/2013 1155   RDW 12.8 06/21/2013 1155   LYMPHSABS 1.2 06/21/2013 1155   EOSABS 0.1 06/21/2013 1155   BASOSABS 0.1 06/21/2013 1155      Assessment/Plan Metastatic colon cancer For Port placement today Explained procedure, risks, complication, use of sedation. Labs reviewed. WBC slightly elevated, but no obvious signs/sxs of infection. Consent signed in  chart  Ascencion Dike PA-C 06/22/2013, 8:30 AM

## 2013-06-23 ENCOUNTER — Encounter: Payer: Self-pay | Admitting: *Deleted

## 2013-06-23 ENCOUNTER — Other Ambulatory Visit: Payer: Self-pay | Admitting: *Deleted

## 2013-06-23 ENCOUNTER — Other Ambulatory Visit: Payer: BC Managed Care – PPO

## 2013-06-23 MED ORDER — LIDOCAINE-PRILOCAINE 2.5-2.5 % EX CREA
1.0000 | TOPICAL_CREAM | CUTANEOUS | Status: DC | PRN
Start: 2013-06-23 — End: 2018-02-03

## 2013-06-23 MED ORDER — LIDOCAINE-PRILOCAINE 2.5-2.5 % EX CREA: 1.0000 "application " | TOPICAL_CREAM | CUTANEOUS | Status: DC | PRN

## 2013-06-28 ENCOUNTER — Encounter: Payer: Self-pay | Admitting: Hematology & Oncology

## 2013-06-28 ENCOUNTER — Ambulatory Visit (HOSPITAL_BASED_OUTPATIENT_CLINIC_OR_DEPARTMENT_OTHER): Payer: BC Managed Care – PPO

## 2013-06-28 VITALS — BP 121/72 | HR 82 | Temp 98.0°F | Resp 18

## 2013-06-28 DIAGNOSIS — Z5111 Encounter for antineoplastic chemotherapy: Secondary | ICD-10-CM

## 2013-06-28 DIAGNOSIS — C189 Malignant neoplasm of colon, unspecified: Secondary | ICD-10-CM

## 2013-06-28 DIAGNOSIS — C787 Secondary malignant neoplasm of liver and intrahepatic bile duct: Secondary | ICD-10-CM

## 2013-06-28 MED ORDER — SODIUM CHLORIDE 0.9 % IJ SOLN
10.0000 mL | INTRAMUSCULAR | Status: DC | PRN
  Filled 2013-06-28: qty 10

## 2013-06-28 MED ORDER — SODIUM CHLORIDE 0.9 % IJ SOLN
3.0000 mL | INTRAMUSCULAR | Status: DC | PRN
  Filled 2013-06-28: qty 10

## 2013-06-28 MED ORDER — DEXAMETHASONE SODIUM PHOSPHATE 10 MG/ML IJ SOLN
INTRAMUSCULAR | Status: AC
  Filled 2013-06-28: qty 1

## 2013-06-28 MED ORDER — FLUOROURACIL CHEMO INJECTION 2.5 GM/50ML
400.0000 mg/m2 | Freq: Once | INTRAVENOUS | Status: AC
  Administered 2013-06-28: 700 mg via INTRAVENOUS
  Filled 2013-06-28: qty 14

## 2013-06-28 MED ORDER — ONDANSETRON 8 MG/50ML IVPB (CHCC)
8.0000 mg | Freq: Once | INTRAVENOUS | Status: AC
  Administered 2013-06-28: 8 mg via INTRAVENOUS

## 2013-06-28 MED ORDER — HEPARIN SOD (PORK) LOCK FLUSH 100 UNIT/ML IV SOLN
250.0000 [IU] | Freq: Once | INTRAVENOUS | Status: DC | PRN
  Filled 2013-06-28: qty 5

## 2013-06-28 MED ORDER — DEXAMETHASONE SODIUM PHOSPHATE 10 MG/ML IJ SOLN
10.0000 mg | Freq: Once | INTRAMUSCULAR | Status: AC
  Administered 2013-06-28: 10 mg via INTRAVENOUS

## 2013-06-28 MED ORDER — LEUCOVORIN CALCIUM INJECTION 350 MG
398.0000 mg/m2 | Freq: Once | INTRAVENOUS | Status: AC
  Administered 2013-06-28: 720 mg via INTRAVENOUS
  Filled 2013-06-28: qty 36

## 2013-06-28 MED ORDER — SODIUM CHLORIDE 0.9 % IV SOLN
2400.0000 mg/m2 | INTRAVENOUS | Status: DC
  Administered 2013-06-28: 4350 mg via INTRAVENOUS
  Filled 2013-06-28: qty 87

## 2013-06-28 MED ORDER — HEPARIN SOD (PORK) LOCK FLUSH 100 UNIT/ML IV SOLN
500.0000 [IU] | Freq: Once | INTRAVENOUS | Status: DC | PRN
  Filled 2013-06-28: qty 5

## 2013-06-28 MED ORDER — ALTEPLASE 2 MG IJ SOLR
2.0000 mg | Freq: Once | INTRAMUSCULAR | Status: DC | PRN
  Filled 2013-06-28: qty 2

## 2013-06-28 MED ORDER — DEXTROSE 5 % IV SOLN
Freq: Once | INTRAVENOUS | Status: AC
  Administered 2013-06-28: 09:00:00 via INTRAVENOUS

## 2013-06-28 MED ORDER — OXALIPLATIN CHEMO INJECTION 100 MG/20ML
85.0000 mg/m2 | Freq: Once | INTRAVENOUS | Status: AC
  Administered 2013-06-28: 155 mg via INTRAVENOUS
  Filled 2013-06-28: qty 31

## 2013-06-28 NOTE — Patient Instructions (Addendum)
[facility] Discharge Instructions for Patients Receiving Chemotherapy  Today you received the following chemotherapy agents 5FU, and oxaliplatin  To help prevent nausea and vomiting after your treatment, we encourage you to take your nausea medication as prescribed:    1)Zofran 8 mg  Take 1 tablet (8 mg total) by mouth 2 (two) times daily. Take two times a day starting the day after chemo for 2 days. Then take two times a day as needed for nausea or vomiting.  2) Decadron 4 mg  Take 2 tablets (8 mg total) by mouth 2 (two) times daily with a meal. Take daily starting the day after chemotherapy for 2 days. Take with food.   3) Ativan Take 1 tablet (0.5 mg total) by mouth every 6 (six) hours as needed (Nausea or vomiting).  4) Compazine 10 mg.  Take 1 tablet by mouth every 6 hours as needed for nausea and vomiting      If you develop nausea and vomiting that is not controlled by your nausea medication, call the clinic. If it is after clinic hours your family physician or the after hours number for the clinic or go to the Emergency Department.   BELOW ARE SYMPTOMS THAT SHOULD BE REPORTED IMMEDIATELY:  *FEVER GREATER THAN 100.5 F  *CHILLS WITH OR WITHOUT FEVER  NAUSEA AND VOMITING THAT IS NOT CONTROLLED WITH YOUR NAUSEA MEDICATION  *UNUSUAL SHORTNESS OF BREATH  *UNUSUAL BRUISING OR BLEEDING  TENDERNESS IN MOUTH AND THROAT WITH OR WITHOUT PRESENCE OF ULCERS  *URINARY PROBLEMS  *BOWEL PROBLEMS  UNUSUAL RASH Items with * indicate a potential emergency and should be followed up as soon as possible.  One of the nurses will contact you 24 hours after your treatment. Please let the nurse know about any problems that you may have experienced. Feel free to call the clinic you have any questions or concerns. The clinic phone number is [phone].   I have been informed and understand all the instructions given to me. I know to contact the clinic, my physician, or go to  the Emergency Department if any problems should occur. I do not have any questions at this time, but understand that I may call the clinic during office hours   should I have any questions or need assistance in obtaining follow up care.    __________________________________________  _____________  __________ Signature of Patient or Authorized Representative            Date                   Time    __________________________________________ Nurse's Signature

## 2013-06-30 ENCOUNTER — Ambulatory Visit (HOSPITAL_BASED_OUTPATIENT_CLINIC_OR_DEPARTMENT_OTHER): Payer: BC Managed Care – PPO

## 2013-06-30 VITALS — BP 119/68 | Temp 97.7°F | Resp 20

## 2013-06-30 DIAGNOSIS — C187 Malignant neoplasm of sigmoid colon: Secondary | ICD-10-CM

## 2013-06-30 DIAGNOSIS — C189 Malignant neoplasm of colon, unspecified: Secondary | ICD-10-CM

## 2013-06-30 DIAGNOSIS — C787 Secondary malignant neoplasm of liver and intrahepatic bile duct: Secondary | ICD-10-CM

## 2013-06-30 MED ORDER — SODIUM CHLORIDE 0.9 % IJ SOLN
10.0000 mL | INTRAMUSCULAR | Status: DC | PRN
  Administered 2013-06-30: 10 mL
  Filled 2013-06-30: qty 10

## 2013-06-30 MED ORDER — HEPARIN SOD (PORK) LOCK FLUSH 100 UNIT/ML IV SOLN
500.0000 [IU] | Freq: Once | INTRAVENOUS | Status: AC | PRN
  Administered 2013-06-30: 500 [IU]
  Filled 2013-06-30: qty 5

## 2013-06-30 NOTE — Patient Instructions (Signed)

## 2013-07-12 ENCOUNTER — Ambulatory Visit (HOSPITAL_BASED_OUTPATIENT_CLINIC_OR_DEPARTMENT_OTHER): Payer: BC Managed Care – PPO | Admitting: Hematology & Oncology

## 2013-07-12 ENCOUNTER — Ambulatory Visit (HOSPITAL_BASED_OUTPATIENT_CLINIC_OR_DEPARTMENT_OTHER): Payer: BC Managed Care – PPO

## 2013-07-12 ENCOUNTER — Other Ambulatory Visit (HOSPITAL_BASED_OUTPATIENT_CLINIC_OR_DEPARTMENT_OTHER): Payer: BC Managed Care – PPO | Admitting: Lab

## 2013-07-12 ENCOUNTER — Encounter: Payer: Self-pay | Admitting: Hematology & Oncology

## 2013-07-12 VITALS — BP 124/70 | HR 72 | Temp 98.1°F | Resp 18 | Ht 65.0 in | Wt 156.0 lb

## 2013-07-12 DIAGNOSIS — Z5112 Encounter for antineoplastic immunotherapy: Secondary | ICD-10-CM

## 2013-07-12 DIAGNOSIS — C787 Secondary malignant neoplasm of liver and intrahepatic bile duct: Secondary | ICD-10-CM

## 2013-07-12 DIAGNOSIS — C189 Malignant neoplasm of colon, unspecified: Secondary | ICD-10-CM

## 2013-07-12 DIAGNOSIS — Z5111 Encounter for antineoplastic chemotherapy: Secondary | ICD-10-CM

## 2013-07-12 DIAGNOSIS — K5900 Constipation, unspecified: Secondary | ICD-10-CM

## 2013-07-12 LAB — CBC WITH DIFFERENTIAL (CANCER CENTER ONLY)
BASO#: 0.1 10*3/uL (ref 0.0–0.2)
BASO%: 1.8 % (ref 0.0–2.0)
EOS ABS: 0.1 10*3/uL (ref 0.0–0.5)
EOS%: 2.1 % (ref 0.0–7.0)
HEMATOCRIT: 41.6 % (ref 38.7–49.9)
HEMOGLOBIN: 13.6 g/dL (ref 13.0–17.1)
LYMPH#: 1.3 10*3/uL (ref 0.9–3.3)
LYMPH%: 19.2 % (ref 14.0–48.0)
MCH: 29 pg (ref 28.0–33.4)
MCHC: 32.7 g/dL (ref 32.0–35.9)
MCV: 89 fL (ref 82–98)
MONO#: 0.7 10*3/uL (ref 0.1–0.9)
MONO%: 10.3 % (ref 0.0–13.0)
NEUT%: 66.6 % (ref 40.0–80.0)
NEUTROS ABS: 4.5 10*3/uL (ref 1.5–6.5)
Platelets: 266 10*3/uL (ref 145–400)
RBC: 4.69 10*6/uL (ref 4.20–5.70)
RDW: 13 % (ref 11.1–15.7)
WBC: 6.8 10*3/uL (ref 4.0–10.0)

## 2013-07-12 LAB — CMP (CANCER CENTER ONLY)
ALBUMIN: 3 g/dL — AB (ref 3.3–5.5)
ALT(SGPT): 28 U/L (ref 10–47)
AST: 35 U/L (ref 11–38)
Alkaline Phosphatase: 212 U/L — ABNORMAL HIGH (ref 26–84)
BUN, Bld: 13 mg/dL (ref 7–22)
CALCIUM: 9.3 mg/dL (ref 8.0–10.3)
CO2: 31 mEq/L (ref 18–33)
CREATININE: 0.5 mg/dL — AB (ref 0.6–1.2)
Chloride: 104 mEq/L (ref 98–108)
GLUCOSE: 110 mg/dL (ref 73–118)
POTASSIUM: 4.3 meq/L (ref 3.3–4.7)
Sodium: 142 mEq/L (ref 128–145)
Total Bilirubin: 0.5 mg/dl (ref 0.20–1.60)
Total Protein: 7.2 g/dL (ref 6.4–8.1)

## 2013-07-12 LAB — UA PROTEIN, DIPSTICK - CHCC SATELLITE: Protein, Urine: NEGATIVE mg/dL

## 2013-07-12 MED ORDER — ALTEPLASE 2 MG IJ SOLR
2.0000 mg | Freq: Once | INTRAMUSCULAR | Status: DC | PRN
  Filled 2013-07-12: qty 2

## 2013-07-12 MED ORDER — SODIUM CHLORIDE 0.9 % IV SOLN
2400.0000 mg/m2 | INTRAVENOUS | Status: DC
  Administered 2013-07-12: 4350 mg via INTRAVENOUS
  Filled 2013-07-12: qty 87

## 2013-07-12 MED ORDER — HEPARIN SOD (PORK) LOCK FLUSH 100 UNIT/ML IV SOLN
500.0000 [IU] | Freq: Once | INTRAVENOUS | Status: DC | PRN
Start: 2013-07-12 — End: 2013-07-12
  Filled 2013-07-12: qty 5

## 2013-07-12 MED ORDER — SODIUM CHLORIDE 0.9 % IJ SOLN
3.0000 mL | INTRAMUSCULAR | Status: DC | PRN
  Filled 2013-07-12: qty 10

## 2013-07-12 MED ORDER — SODIUM CHLORIDE 0.9 % IV SOLN
5.0000 mg/kg | Freq: Once | INTRAVENOUS | Status: AC
  Administered 2013-07-12: 350 mg via INTRAVENOUS
  Filled 2013-07-12: qty 14

## 2013-07-12 MED ORDER — FLUOROURACIL CHEMO INJECTION 2.5 GM/50ML
400.0000 mg/m2 | Freq: Once | INTRAVENOUS | Status: AC
  Administered 2013-07-12: 700 mg via INTRAVENOUS
  Filled 2013-07-12: qty 14

## 2013-07-12 MED ORDER — HEPARIN SOD (PORK) LOCK FLUSH 100 UNIT/ML IV SOLN
250.0000 [IU] | Freq: Once | INTRAVENOUS | Status: DC | PRN
  Filled 2013-07-12: qty 5

## 2013-07-12 MED ORDER — ONDANSETRON 8 MG/50ML IVPB (CHCC)
8.0000 mg | Freq: Once | INTRAVENOUS | Status: AC
  Administered 2013-07-12: 8 mg via INTRAVENOUS

## 2013-07-12 MED ORDER — OXALIPLATIN CHEMO INJECTION 100 MG/20ML
85.0000 mg/m2 | Freq: Once | INTRAVENOUS | Status: AC
  Administered 2013-07-12: 155 mg via INTRAVENOUS
  Filled 2013-07-12: qty 31

## 2013-07-12 MED ORDER — SODIUM CHLORIDE 0.9 % IJ SOLN
10.0000 mL | INTRAMUSCULAR | Status: DC | PRN
  Filled 2013-07-12: qty 10

## 2013-07-12 MED ORDER — DEXAMETHASONE SODIUM PHOSPHATE 10 MG/ML IJ SOLN
10.0000 mg | Freq: Once | INTRAMUSCULAR | Status: AC
  Administered 2013-07-12: 10 mg via INTRAVENOUS

## 2013-07-12 MED ORDER — DEXAMETHASONE SODIUM PHOSPHATE 10 MG/ML IJ SOLN
INTRAMUSCULAR | Status: AC
  Filled 2013-07-12: qty 1

## 2013-07-12 MED ORDER — LEUCOVORIN CALCIUM INJECTION 350 MG
398.0000 mg/m2 | Freq: Once | INTRAVENOUS | Status: AC
  Administered 2013-07-12: 720 mg via INTRAVENOUS
  Filled 2013-07-12: qty 36

## 2013-07-12 MED ORDER — DEXTROSE 5 % IV SOLN
Freq: Once | INTRAVENOUS | Status: AC
  Administered 2013-07-12: 13:00:00 via INTRAVENOUS

## 2013-07-12 NOTE — Progress Notes (Signed)
Hematology and Oncology Follow Up Visit  Ryan Ryan Maynard [id number] 1950/06/08 63 y.o. 07/12/2013   Principle Diagnosis:   Metastatic colon cancer  Current Therapy:    Status post one cycle of FOLFOX with Avastin     Interim History:  Ryan Ryan Maynard is is back for followup. He actually tolerated his first cycle chemotherapy quite well. He did not have much in the way of nausea vomiting. Constipation continues to be his biggest issue. He is on lactulose. Hopefully, with regression of his tumor, we might be able to improve his constipation.  He is eating quite a bit. He sometimes afraid to eat too much for he does not want to get bound up and then have difficulties go to the bathroom.  He feels that his abdomen is not as full.  He wants to try to exercise more. I told him I do not see problems with this. I does want to be sure that he eats and drinks and off.  He's had no cough or shortness of breath. He's had no bleeding.  Medications: Current outpatient prescriptions:amLODipine-benazepril (LOTREL) 5-20 MG per capsule, Take 1 capsule by mouth daily. , Disp: , Rfl: ;  Coenzyme Q10 10 MG capsule, Take 10 mg by mouth daily., Disp: , Rfl: ;  dexamethasone (DECADRON) 4 MG tablet, Take 8 mg by mouth 2 (two) times daily with a meal. HR DOES NOT TAKE, Disp: , Rfl: ;  gabapentin (NEURONTIN) 100 MG capsule, Take 100 mg by mouth 3 (three) times daily., Disp: , Rfl:  Lactulose 20 GM/30ML SOLN, Take 30 mLs (20 g total) by mouth 4 (four) times daily as needed., Disp: 30 mL, Rfl: 6;  lidocaine-prilocaine (EMLA) cream, Apply 1 application topically as needed. Apply quarter sized amount to portacath site 1-2 hours prior to chemotherapy appt.  Cover with saran wrap., Disp: 30 g, Rfl: 3;  LORazepam (ATIVAN) 0.5 MG tablet, Take 0.5 mg by mouth as needed (Nausea or vomiting)., Disp: , Rfl:  ondansetron (ZOFRAN) 8 MG tablet, Take 8 mg by mouth 2 (two) times daily. Take two times a day starting the day after chemo for 2  days. DOES NOT TAKE, Disp: , Rfl:  No current facility-administered medications for this visit. Facility-Administered Medications Ordered in Other Visits: alteplase (CATHFLO ACTIVASE) injection 2 mg, 2 mg, Intracatheter, Once PRN, Volanda Napoleon, MD;  fluorouracil (ADRUCIL) 4,350 mg in sodium chloride 0.9 % 150 mL chemo infusion, 2,400 mg/m2 (Treatment Plan Actual), Intravenous, 1 day or 1 dose, Volanda Napoleon, MD;  fluorouracil (ADRUCIL) chemo injection 700 mg, 400 mg/m2 (Treatment Plan Actual), Intravenous, Once, Volanda Napoleon, MD heparin lock flush 100 unit/mL, 500 Units, Intracatheter, Once PRN, Volanda Napoleon, MD;  heparin lock flush 100 unit/mL, 250 Units, Intracatheter, Once PRN, Volanda Napoleon, MD;  leucovorin 720 mg in dextrose 5 % 250 mL infusion, 398 mg/m2 (Treatment Plan Actual), Intravenous, Once, Volanda Napoleon, MD;  oxaliplatin (ELOXATIN) 155 mg in dextrose 5 % 500 mL chemo infusion, 85 mg/m2 (Treatment Plan Actual), Intravenous, Once, Volanda Napoleon, MD sodium chloride 0.9 % injection 10 mL, 10 mL, Intracatheter, PRN, Volanda Napoleon, MD;  sodium chloride 0.9 % injection 3 mL, 3 mL, Intravenous, PRN, Volanda Napoleon, MD  Allergies: No Known Allergies  Past Medical History, Surgical history, Social history, and Family History were reviewed and updated.  Review of Systems: As above  Physical Exam:  height is 5\' 5"  (1.651 m) and weight is 156 lb (70.761 kg). His oral  temperature is 98.1 F (36.7 C). His blood pressure is 124/70 and his pulse is 72. His respiration is 18.   Well-developed well-nourished. His head and neck exam shows no ocular or oral lesions. He has no adenopathy in the neck. There is no scleral icterus. Lungs are clear. Cardiac exam regular rate and rhythm with no murmurs rubs or bruits. Abdomen is soft. His liver edge is about 37 is below the right costal margin. Liver does extend across the midline but not as prominent. There is no ascites. There is no  splenomegaly. Extremities shows no clubbing cyanosis or edema. Back exam no tenderness over the spine. Skin exam no rashes.  Lab Results  Component Value Date   WBC 6.8 07/12/2013   HGB 13.6 07/12/2013   HCT 41.6 07/12/2013   MCV 89 07/12/2013   PLT 266 07/12/2013     Chemistry      Component Value Date/Time   NA 142 07/12/2013 1039   NA 136* 06/22/2013 0800   K 4.3 07/12/2013 1039   K 4.4 06/22/2013 0800   CL 104 07/12/2013 1039   CL 97 06/22/2013 0800   CO2 31 07/12/2013 1039   CO2 25 06/22/2013 0800   BUN 13 07/12/2013 1039   BUN 15 06/22/2013 0800   CREATININE 0.5* 07/12/2013 1039   CREATININE 0.68 06/22/2013 0800      Component Value Date/Time   CALCIUM 9.3 07/12/2013 1039   CALCIUM 9.5 06/22/2013 0800   ALKPHOS 212* 07/12/2013 1039   AST 35 07/12/2013 1039   ALT 28 07/12/2013 1039   BILITOT 0.50 07/12/2013 1039         Impression and Plan: Mr.  Ryan Maynard is a 62 liniment with metastatic colon cancer. He's had one cycle chemotherapy. He's on does not look any worse. His liver function tests are a little better. He seems to be doing a little bit better.  We will continue him on chemotherapy therapy.  We will see what his CEA level is after this next cycle. This will give [place] a good idea as to how well we are doing.  Plan to see him back in 2 more weeks.   Volanda Napoleon, MD 2/9/201512:51 PM

## 2013-07-12 NOTE — Patient Instructions (Signed)
[facility] Discharge Instructions for Patients Receiving Chemotherapy  Today you received the following chemotherapy agents 5FU, and oxaliplatin  To help prevent nausea and vomiting after your treatment, we encourage you to take your nausea medication as prescribed:    1)Zofran 8 mg  Take 1 tablet (8 mg total) by mouth 2 (two) times daily. Take two times a day starting the day after chemo for 2 days. Then take two times a day as needed for nausea or vomiting.  2) Decadron 4 mg  Take 2 tablets (8 mg total) by mouth 2 (two) times daily with a meal. Take daily starting the day after chemotherapy for 2 days. Take with food.   3) Ativan Take 1 tablet (0.5 mg total) by mouth every 6 (six) hours as needed (Nausea or vomiting).  4) Compazine 10 mg.  Take 1 tablet by mouth every 6 hours as needed for nausea and vomiting      If you develop nausea and vomiting that is not controlled by your nausea medication, call the clinic. If it is after clinic hours your family physician or the after hours number for the clinic or go to the Emergency Department.   BELOW ARE SYMPTOMS THAT SHOULD BE REPORTED IMMEDIATELY:  *FEVER GREATER THAN 100.5 F  *CHILLS WITH OR WITHOUT FEVER  NAUSEA AND VOMITING THAT IS NOT CONTROLLED WITH YOUR NAUSEA MEDICATION  *UNUSUAL SHORTNESS OF BREATH  *UNUSUAL BRUISING OR BLEEDING  TENDERNESS IN MOUTH AND THROAT WITH OR WITHOUT PRESENCE OF ULCERS  *URINARY PROBLEMS  *BOWEL PROBLEMS  UNUSUAL RASH Items with * indicate a potential emergency and should be followed up as soon as possible.  One of the nurses will contact you 24 hours after your treatment. Please let the nurse know about any problems that you may have experienced. Feel free to call the clinic you have any questions or concerns. The clinic phone number is [phone].   I have been informed and understand all the instructions given to me. I know to contact the clinic, my physician, or go to  the Emergency Department if any problems should occur. I do not have any questions at this time, but understand that I may call the clinic during office hours   should I have any questions or need assistance in obtaining follow up care.    __________________________________________  _____________  __________ Signature of Patient or Authorized Representative            Date                   Time    __________________________________________ Nurse's Signature    

## 2013-07-14 ENCOUNTER — Ambulatory Visit (HOSPITAL_BASED_OUTPATIENT_CLINIC_OR_DEPARTMENT_OTHER): Payer: BC Managed Care – PPO

## 2013-07-14 DIAGNOSIS — Z452 Encounter for adjustment and management of vascular access device: Secondary | ICD-10-CM

## 2013-07-14 DIAGNOSIS — C787 Secondary malignant neoplasm of liver and intrahepatic bile duct: Secondary | ICD-10-CM

## 2013-07-14 DIAGNOSIS — C189 Malignant neoplasm of colon, unspecified: Secondary | ICD-10-CM

## 2013-07-14 MED ORDER — SODIUM CHLORIDE 0.9 % IJ SOLN
10.0000 mL | INTRAMUSCULAR | Status: DC | PRN
  Administered 2013-07-14: 10 mL
  Filled 2013-07-14: qty 10

## 2013-07-14 MED ORDER — HEPARIN SOD (PORK) LOCK FLUSH 100 UNIT/ML IV SOLN
500.0000 [IU] | Freq: Once | INTRAVENOUS | Status: AC | PRN
  Administered 2013-07-14: 500 [IU]
  Filled 2013-07-14: qty 5

## 2013-07-26 ENCOUNTER — Encounter: Payer: Self-pay | Admitting: Hematology & Oncology

## 2013-07-26 ENCOUNTER — Ambulatory Visit (HOSPITAL_BASED_OUTPATIENT_CLINIC_OR_DEPARTMENT_OTHER): Payer: BC Managed Care – PPO | Admitting: Hematology & Oncology

## 2013-07-26 ENCOUNTER — Other Ambulatory Visit (HOSPITAL_BASED_OUTPATIENT_CLINIC_OR_DEPARTMENT_OTHER): Payer: BC Managed Care – PPO | Admitting: Lab

## 2013-07-26 ENCOUNTER — Ambulatory Visit (HOSPITAL_BASED_OUTPATIENT_CLINIC_OR_DEPARTMENT_OTHER): Payer: BC Managed Care – PPO

## 2013-07-26 VITALS — BP 126/78 | HR 80 | Temp 98.1°F | Resp 18 | Ht 65.0 in | Wt 162.0 lb

## 2013-07-26 DIAGNOSIS — C189 Malignant neoplasm of colon, unspecified: Secondary | ICD-10-CM

## 2013-07-26 DIAGNOSIS — C787 Secondary malignant neoplasm of liver and intrahepatic bile duct: Secondary | ICD-10-CM

## 2013-07-26 DIAGNOSIS — Z5111 Encounter for antineoplastic chemotherapy: Secondary | ICD-10-CM

## 2013-07-26 DIAGNOSIS — Z5112 Encounter for antineoplastic immunotherapy: Secondary | ICD-10-CM

## 2013-07-26 LAB — CMP (CANCER CENTER ONLY)
ALT(SGPT): 30 U/L (ref 10–47)
AST: 32 U/L (ref 11–38)
Albumin: 3.2 g/dL — ABNORMAL LOW (ref 3.3–5.5)
Alkaline Phosphatase: 130 U/L — ABNORMAL HIGH (ref 26–84)
BILIRUBIN TOTAL: 0.5 mg/dL (ref 0.20–1.60)
BUN, Bld: 16 mg/dL (ref 7–22)
CALCIUM: 9.1 mg/dL (ref 8.0–10.3)
CHLORIDE: 104 meq/L (ref 98–108)
CO2: 28 mEq/L (ref 18–33)
CREATININE: 0.5 mg/dL — AB (ref 0.6–1.2)
Glucose, Bld: 93 mg/dL (ref 73–118)
Potassium: 4.4 mEq/L (ref 3.3–4.7)
Sodium: 141 mEq/L (ref 128–145)
Total Protein: 7.3 g/dL (ref 6.4–8.1)

## 2013-07-26 LAB — CBC WITH DIFFERENTIAL (CANCER CENTER ONLY)
BASO#: 0.2 10*3/uL (ref 0.0–0.2)
BASO%: 3.3 % — ABNORMAL HIGH (ref 0.0–2.0)
EOS%: 1.6 % (ref 0.0–7.0)
Eosinophils Absolute: 0.1 10*3/uL (ref 0.0–0.5)
HCT: 43.3 % (ref 38.7–49.9)
HGB: 14.2 g/dL (ref 13.0–17.1)
LYMPH#: 1.3 10*3/uL (ref 0.9–3.3)
LYMPH%: 23.4 % (ref 14.0–48.0)
MCH: 29 pg (ref 28.0–33.4)
MCHC: 32.8 g/dL (ref 32.0–35.9)
MCV: 89 fL (ref 82–98)
MONO#: 0.8 10*3/uL (ref 0.1–0.9)
MONO%: 14.4 % — ABNORMAL HIGH (ref 0.0–13.0)
NEUT%: 57.3 % (ref 40.0–80.0)
NEUTROS ABS: 3.1 10*3/uL (ref 1.5–6.5)
PLATELETS: 190 10*3/uL (ref 145–400)
RBC: 4.89 10*6/uL (ref 4.20–5.70)
RDW: 14.4 % (ref 11.1–15.7)
WBC: 5.5 10*3/uL (ref 4.0–10.0)

## 2013-07-26 LAB — CEA: CEA: 1337.8 ng/mL — ABNORMAL HIGH (ref 0.0–5.0)

## 2013-07-26 LAB — LACTATE DEHYDROGENASE: LDH: 168 U/L (ref 94–250)

## 2013-07-26 MED ORDER — SODIUM CHLORIDE 0.9 % IV SOLN
2400.0000 mg/m2 | INTRAVENOUS | Status: DC
  Administered 2013-07-26: 4350 mg via INTRAVENOUS
  Filled 2013-07-26: qty 87

## 2013-07-26 MED ORDER — SODIUM CHLORIDE 0.9 % IV SOLN: Freq: Once | INTRAVENOUS | Status: DC

## 2013-07-26 MED ORDER — DEXTROSE 5 % IV SOLN
85.0000 mg/m2 | Freq: Once | INTRAVENOUS | Status: AC
  Administered 2013-07-26: 155 mg via INTRAVENOUS
  Filled 2013-07-26: qty 31

## 2013-07-26 MED ORDER — SODIUM CHLORIDE 0.9 % IV SOLN
5.0000 mg/kg | Freq: Once | INTRAVENOUS | Status: AC
  Administered 2013-07-26: 350 mg via INTRAVENOUS
  Filled 2013-07-26: qty 14

## 2013-07-26 MED ORDER — DEXTROSE 5 % IV SOLN
Freq: Once | INTRAVENOUS | Status: AC
  Administered 2013-07-26: 09:00:00 via INTRAVENOUS

## 2013-07-26 MED ORDER — ONDANSETRON 8 MG/50ML IVPB (CHCC)
8.0000 mg | Freq: Once | INTRAVENOUS | Status: AC
  Administered 2013-07-26: 8 mg via INTRAVENOUS

## 2013-07-26 MED ORDER — LEUCOVORIN CALCIUM INJECTION 350 MG
400.0000 mg/m2 | Freq: Once | INTRAMUSCULAR | Status: AC
  Administered 2013-07-26: 724 mg via INTRAVENOUS
  Filled 2013-07-26: qty 36.2

## 2013-07-26 MED ORDER — FLUOROURACIL CHEMO INJECTION 2.5 GM/50ML
400.0000 mg/m2 | Freq: Once | INTRAVENOUS | Status: AC
  Administered 2013-07-26: 700 mg via INTRAVENOUS
  Filled 2013-07-26: qty 14

## 2013-07-26 MED ORDER — DEXAMETHASONE SODIUM PHOSPHATE 10 MG/ML IJ SOLN
10.0000 mg | Freq: Once | INTRAMUSCULAR | Status: AC
  Administered 2013-07-26: 10 mg via INTRAVENOUS

## 2013-07-26 MED ORDER — DEXAMETHASONE SODIUM PHOSPHATE 10 MG/ML IJ SOLN
INTRAMUSCULAR | Status: AC
  Filled 2013-07-26: qty 1

## 2013-07-26 NOTE — Patient Instructions (Signed)
[facility] Discharge Instructions for Patients Receiving Chemotherapy  Today you received the following chemotherapy agents 5FU, and oxaliplatin  To help prevent nausea and vomiting after your treatment, we encourage you to take your nausea medication as prescribed:    1)Zofran 8 mg  Take 1 tablet (8 mg total) by mouth 2 (two) times daily. Take two times a day starting the day after chemo for 2 days. Then take two times a day as needed for nausea or vomiting.  2) Decadron 4 mg  Take 2 tablets (8 mg total) by mouth 2 (two) times daily with a meal. Take daily starting the day after chemotherapy for 2 days. Take with food.   3) Ativan Take 1 tablet (0.5 mg total) by mouth every 6 (six) hours as needed (Nausea or vomiting).  4) Compazine 10 mg.  Take 1 tablet by mouth every 6 hours as needed for nausea and vomiting      If you develop nausea and vomiting that is not controlled by your nausea medication, call the clinic. If it is after clinic hours your family physician or the after hours number for the clinic or go to the Emergency Department.   BELOW ARE SYMPTOMS THAT SHOULD BE REPORTED IMMEDIATELY:  *FEVER GREATER THAN 100.5 F  *CHILLS WITH OR WITHOUT FEVER  NAUSEA AND VOMITING THAT IS NOT CONTROLLED WITH YOUR NAUSEA MEDICATION  *UNUSUAL SHORTNESS OF BREATH  *UNUSUAL BRUISING OR BLEEDING  TENDERNESS IN MOUTH AND THROAT WITH OR WITHOUT PRESENCE OF ULCERS  *URINARY PROBLEMS  *BOWEL PROBLEMS  UNUSUAL RASH Items with * indicate a potential emergency and should be followed up as soon as possible.  One of the nurses will contact you 24 hours after your treatment. Please let the nurse know about any problems that you may have experienced. Feel free to call the clinic you have any questions or concerns. The clinic phone number is [phone].   I have been informed and understand all the instructions given to me. I know to contact the clinic, my physician, or go to  the Emergency Department if any problems should occur. I do not have any questions at this time, but understand that I may call the clinic during office hours   should I have any questions or need assistance in obtaining follow up care.    __________________________________________  _____________  __________ Signature of Patient or Authorized Representative            Date                   Time    __________________________________________ Nurse's Signature    

## 2013-07-26 NOTE — Progress Notes (Signed)
Hematology and Oncology Follow Up Visit  Ryan Maynard [id number] 1951/01/10 63 y.o. 07/12/2013   Principle Diagnosis:   Metastatic colon cancer  Current Therapy:    Status post 2 cycle of FOLFOX with Avastin     Interim History:  [name] is is back for followup. He actually tolerated chemotherapy quite well. He did not have much in the way of nausea vomiting. Constipation is much better. Is not having nearly as much difficulty. He is eating better. He is eating quite a bit.  He feels that his abdomen is not as full.  He wants to try to exercise more. I told him I do not see problems with this.    He has had no fever. There is no swallowing difficulties. He's had no cough or shortness of breath. He's had no bleeding.  His overall performance status is ECoG 1.  Medications: Current outpatient prescriptions:amLODipine-benazepril (LOTREL) 5-20 MG per capsule, Take 1 capsule by mouth daily. , Disp: , Rfl: ;  Coenzyme Q10 10 MG capsule, Take 10 mg by mouth daily., Disp: , Rfl: ;  dexamethasone (DECADRON) 4 MG tablet, Take 8 mg by mouth 2 (two) times daily with a meal. HR DOES NOT TAKE, Disp: , Rfl: ;  gabapentin (NEURONTIN) 100 MG capsule, Take 100 mg by mouth 3 (three) times daily., Disp: , Rfl:  Lactulose 20 GM/30ML SOLN, Take 30 mLs (20 g total) by mouth 4 (four) times daily as needed., Disp: 30 mL, Rfl: 6;  lidocaine-prilocaine (EMLA) cream, Apply 1 application topically as needed. Apply quarter sized amount to portacath site 1-2 hours prior to chemotherapy appt.  Cover with saran wrap., Disp: 30 g, Rfl: 3;  LORazepam (ATIVAN) 0.5 MG tablet, Take 0.5 mg by mouth as needed (Nausea or vomiting)., Disp: , Rfl:  ondansetron (ZOFRAN) 8 MG tablet, Take 8 mg by mouth 2 (two) times daily. Take two times a day starting the day after chemo for 2 days. DOES NOT TAKE, Disp: , Rfl:  No current facility-administered medications for this visit. Facility-Administered Medications Ordered in Other Visits:  alteplase (CATHFLO ACTIVASE) injection 2 mg, 2 mg, Intracatheter, Once PRN, Volanda Napoleon, MD;  fluorouracil (ADRUCIL) 4,350 mg in sodium chloride 0.9 % 150 mL chemo infusion, 2,400 mg/m2 (Treatment Plan Actual), Intravenous, 1 day or 1 dose, Volanda Napoleon, MD;  fluorouracil (ADRUCIL) chemo injection 700 mg, 400 mg/m2 (Treatment Plan Actual), Intravenous, Once, Volanda Napoleon, MD heparin lock flush 100 unit/mL, 500 Units, Intracatheter, Once PRN, Volanda Napoleon, MD;  heparin lock flush 100 unit/mL, 250 Units, Intracatheter, Once PRN, Volanda Napoleon, MD;  leucovorin 720 mg in dextrose 5 % 250 mL infusion, 398 mg/m2 (Treatment Plan Actual), Intravenous, Once, Volanda Napoleon, MD;  oxaliplatin (ELOXATIN) 155 mg in dextrose 5 % 500 mL chemo infusion, 85 mg/m2 (Treatment Plan Actual), Intravenous, Once, Volanda Napoleon, MD sodium chloride 0.9 % injection 10 mL, 10 mL, Intracatheter, PRN, Volanda Napoleon, MD;  sodium chloride 0.9 % injection 3 mL, 3 mL, Intravenous, PRN, Volanda Napoleon, MD  Allergies: No Known Allergies  Past Medical History, Surgical history, Social history, and Family History were reviewed and updated.  Review of Systems: As above  Physical Exam:  height is 5\' 5"  (1.651 m) and weight is 162 lb (70.761 kg). His oral temperature is 98.1 F (36.7 C). His blood pressure is 126/78 and his pulse is 80 His respiration is 18.   Well-developed well-nourished. His head and neck exam shows no  ocular or oral lesions. He has no adenopathy in the neck. There is no scleral icterus. Lungs are clear. Cardiac exam regular rate and rhythm with no murmurs rubs or bruits. Abdomen is soft. His liver edge is just belowthe right costal margin. Liver does not extend across the midline There is no ascites. There is no splenomegaly. Extremities shows no clubbing cyanosis or edema. Back exam no tenderness over the spine. Skin exam no rashes.  Lab Results  Component Value Date   WBC 6.8 07/12/2013   HGB  13.6 07/12/2013   HCT 41.6 07/12/2013   MCV 89 07/12/2013   PLT 266 07/12/2013     Chemistry      Component Value Date/Time   NA 142 07/12/2013 1039   NA 136* 06/22/2013 0800   K 4.3 07/12/2013 1039   K 4.4 06/22/2013 0800   CL 104 07/12/2013 1039   CL 97 06/22/2013 0800   CO2 31 07/12/2013 1039   CO2 25 06/22/2013 0800   BUN 13 07/12/2013 1039   BUN 15 06/22/2013 0800   CREATININE 0.5* 07/12/2013 1039   CREATININE 0.68 06/22/2013 0800      Component Value Date/Time   CALCIUM 9.3 07/12/2013 1039   CALCIUM 9.5 06/22/2013 0800   ALKPHOS 212* 07/12/2013 1039   AST 35 07/12/2013 1039   ALT 28 07/12/2013 1039   BILITOT 0.50 07/12/2013 1039         Impression and Plan: Mr.  Ryan Maynard is a 14 liniment with metastatic colon cancer. He's had 2 cycle chemotherapy. He is tolerating chemotherapy very nicely. Chemotherapy is working. His liver is shrinking. He's going to the bathroom better. His liver function tests continue to improve.  We will we will proceed with cycle 3 of chemotherapy.  We will go ahead and plan for another cycle and then rescan him.  So happy that he is doing well. He is responding. His quality of life is better.

## 2013-07-28 ENCOUNTER — Ambulatory Visit (HOSPITAL_BASED_OUTPATIENT_CLINIC_OR_DEPARTMENT_OTHER): Payer: BC Managed Care – PPO

## 2013-07-28 DIAGNOSIS — C189 Malignant neoplasm of colon, unspecified: Secondary | ICD-10-CM

## 2013-07-28 DIAGNOSIS — C787 Secondary malignant neoplasm of liver and intrahepatic bile duct: Secondary | ICD-10-CM

## 2013-07-28 MED ORDER — SODIUM CHLORIDE 0.9 % IJ SOLN
10.0000 mL | INTRAMUSCULAR | Status: DC | PRN
  Administered 2013-07-28: 10 mL
  Filled 2013-07-28: qty 10

## 2013-07-28 MED ORDER — HEPARIN SOD (PORK) LOCK FLUSH 100 UNIT/ML IV SOLN
500.0000 [IU] | Freq: Once | INTRAVENOUS | Status: AC | PRN
  Administered 2013-07-28: 500 [IU]
  Filled 2013-07-28: qty 5

## 2013-07-28 NOTE — Patient Instructions (Signed)
Fluorouracil, 5-FU injection What is this medicine? FLUOROURACIL, 5-FU (flure oh YOOR a sil) is a chemotherapy drug. It slows the growth of cancer cells. This medicine is used to treat many types of cancer like breast cancer, colon or rectal cancer, pancreatic cancer, and stomach cancer. This medicine may be used for other purposes; ask your health care provider or pharmacist if you have questions. COMMON BRAND NAME(S): Adrucil What should I tell my health care provider before I take this medicine? They need to know if you have any of these conditions: -blood disorders -dihydropyrimidine dehydrogenase (DPD) deficiency -infection (especially a virus infection such as chickenpox, cold sores, or herpes) -kidney disease -liver disease -malnourished, poor nutrition -recent or ongoing radiation therapy -an unusual or allergic reaction to fluorouracil, other chemotherapy, other medicines, foods, dyes, or preservatives -pregnant or trying to get pregnant -breast-feeding How should I use this medicine? This drug is given as an infusion or injection into a vein. It is administered in a hospital or clinic by a specially trained health care professional. Talk to your pediatrician regarding the use of this medicine in children. Special care may be needed. Overdosage: If you think you have taken too much of this medicine contact a poison control center or emergency room at once. NOTE: This medicine is only for you. Do not share this medicine with others. What if I miss a dose? It is important not to miss your dose. Call your doctor or health care professional if you are unable to keep an appointment. What may interact with this medicine? -allopurinol -cimetidine -dapsone -digoxin -hydroxyurea -leucovorin -levamisole -medicines for seizures like ethotoin, fosphenytoin, phenytoin -medicines to increase blood counts like filgrastim, pegfilgrastim, sargramostim -medicines that treat or prevent blood  clots like warfarin, enoxaparin, and dalteparin -methotrexate -metronidazole -pyrimethamine -some other chemotherapy drugs like busulfan, cisplatin, estramustine, vinblastine -trimethoprim -trimetrexate -vaccines Talk to your doctor or health care professional before taking any of these medicines: -acetaminophen -aspirin -ibuprofen -ketoprofen -naproxen This list may not describe all possible interactions. Give your health care provider a list of all the medicines, herbs, non-prescription drugs, or dietary supplements you use. Also tell them if you smoke, drink alcohol, or use illegal drugs. Some items may interact with your medicine. What should I watch for while using this medicine? Visit your doctor for checks on your progress. This drug may make you feel generally unwell. This is not uncommon, as chemotherapy can affect healthy cells as well as cancer cells. Report any side effects. Continue your course of treatment even though you feel ill unless your doctor tells you to stop. In some cases, you may be given additional medicines to help with side effects. Follow all directions for their use. Call your doctor or health care professional for advice if you get a fever, chills or sore throat, or other symptoms of a cold or flu. Do not treat yourself. This drug decreases your body's ability to fight infections. Try to avoid being around people who are sick. This medicine may increase your risk to bruise or bleed. Call your doctor or health care professional if you notice any unusual bleeding. Be careful brushing and flossing your teeth or using a toothpick because you may get an infection or bleed more easily. If you have any dental work done, tell your dentist you are receiving this medicine. Avoid taking products that contain aspirin, acetaminophen, ibuprofen, naproxen, or ketoprofen unless instructed by your doctor. These medicines may hide a fever. Do not become pregnant while taking this  medicine. Women should inform their doctor if they wish to become pregnant or think they might be pregnant. There is a potential for serious side effects to an unborn child. Talk to your health care professional or pharmacist for more information. Do not breast-feed an infant while taking this medicine. Men should inform their doctor if they wish to father a child. This medicine may lower sperm counts. Do not treat diarrhea with over the counter products. Contact your doctor if you have diarrhea that lasts more than 2 days or if it is severe and watery. This medicine can make you more sensitive to the sun. Keep out of the sun. If you cannot avoid being in the sun, wear protective clothing and use sunscreen. Do not use sun lamps or tanning beds/booths. What side effects may I notice from receiving this medicine? Side effects that you should report to your doctor or health care professional as soon as possible: -allergic reactions like skin rash, itching or hives, swelling of the face, lips, or tongue -low blood counts - this medicine may decrease the number of white blood cells, red blood cells and platelets. You may be at increased risk for infections and bleeding. -signs of infection - fever or chills, cough, sore throat, pain or difficulty passing urine -signs of decreased platelets or bleeding - bruising, pinpoint red spots on the skin, black, tarry stools, blood in the urine -signs of decreased red blood cells - unusually weak or tired, fainting spells, lightheadedness -breathing problems -changes in vision -chest pain -mouth sores -nausea and vomiting -pain, swelling, redness at site where injected -pain, tingling, numbness in the hands or feet -redness, swelling, or sores on hands or feet -stomach pain -unusual bleeding Side effects that usually do not require medical attention (report to your doctor or health care professional if they continue or are bothersome): -changes in finger or  toe nails -diarrhea -dry or itchy skin -hair loss -headache -loss of appetite -sensitivity of eyes to the light -stomach upset -unusually teary eyes This list may not describe all possible side effects. Call your doctor for medical advice about side effects. You may report side effects to FDA at 1-800-FDA-1088. Where should I keep my medicine? This drug is given in a hospital or clinic and will not be stored at home. NOTE: This sheet is a summary. It may not cover all possible information. If you have questions about this medicine, talk to your doctor, pharmacist, or health care provider.  2014, [employer]. (2007-09-23 13:53:16)  

## 2013-08-02 ENCOUNTER — Ambulatory Visit: Payer: BC Managed Care – PPO

## 2013-08-02 ENCOUNTER — Other Ambulatory Visit: Payer: BC Managed Care – PPO | Admitting: Lab

## 2013-08-02 ENCOUNTER — Ambulatory Visit: Payer: BC Managed Care – PPO | Admitting: Hematology & Oncology

## 2013-08-09 ENCOUNTER — Telehealth: Payer: Self-pay | Admitting: Hematology & Oncology

## 2013-08-09 ENCOUNTER — Ambulatory Visit (HOSPITAL_BASED_OUTPATIENT_CLINIC_OR_DEPARTMENT_OTHER): Payer: BC Managed Care – PPO

## 2013-08-09 ENCOUNTER — Encounter: Payer: Self-pay | Admitting: Hematology & Oncology

## 2013-08-09 ENCOUNTER — Other Ambulatory Visit (HOSPITAL_BASED_OUTPATIENT_CLINIC_OR_DEPARTMENT_OTHER): Payer: BC Managed Care – PPO | Admitting: Lab

## 2013-08-09 ENCOUNTER — Ambulatory Visit (HOSPITAL_BASED_OUTPATIENT_CLINIC_OR_DEPARTMENT_OTHER): Payer: BC Managed Care – PPO | Admitting: Hematology & Oncology

## 2013-08-09 VITALS — BP 140/81 | HR 79 | Temp 97.8°F | Resp 18 | Ht 64.0 in | Wt 170.0 lb

## 2013-08-09 DIAGNOSIS — C187 Malignant neoplasm of sigmoid colon: Secondary | ICD-10-CM

## 2013-08-09 DIAGNOSIS — C189 Malignant neoplasm of colon, unspecified: Secondary | ICD-10-CM

## 2013-08-09 DIAGNOSIS — C787 Secondary malignant neoplasm of liver and intrahepatic bile duct: Secondary | ICD-10-CM

## 2013-08-09 DIAGNOSIS — Z5111 Encounter for antineoplastic chemotherapy: Secondary | ICD-10-CM

## 2013-08-09 DIAGNOSIS — Z5112 Encounter for antineoplastic immunotherapy: Secondary | ICD-10-CM

## 2013-08-09 LAB — CMP (CANCER CENTER ONLY)
ALBUMIN: 3.4 g/dL (ref 3.3–5.5)
ALT(SGPT): 31 U/L (ref 10–47)
AST: 28 U/L (ref 11–38)
Alkaline Phosphatase: 96 U/L — ABNORMAL HIGH (ref 26–84)
BUN, Bld: 14 mg/dL (ref 7–22)
CO2: 30 mEq/L (ref 18–33)
Calcium: 9.2 mg/dL (ref 8.0–10.3)
Chloride: 105 mEq/L (ref 98–108)
Creat: 0.8 mg/dl (ref 0.6–1.2)
GLUCOSE: 94 mg/dL (ref 73–118)
POTASSIUM: 4 meq/L (ref 3.3–4.7)
SODIUM: 139 meq/L (ref 128–145)
TOTAL PROTEIN: 7.1 g/dL (ref 6.4–8.1)
Total Bilirubin: 0.6 mg/dl (ref 0.20–1.60)

## 2013-08-09 LAB — CBC WITH DIFFERENTIAL (CANCER CENTER ONLY)
BASO#: 0.1 10*3/uL (ref 0.0–0.2)
BASO%: 2.3 % — AB (ref 0.0–2.0)
EOS%: 2.1 % (ref 0.0–7.0)
Eosinophils Absolute: 0.1 10*3/uL (ref 0.0–0.5)
HCT: 43.3 % (ref 38.7–49.9)
HGB: 14.4 g/dL (ref 13.0–17.1)
LYMPH#: 1.3 10*3/uL (ref 0.9–3.3)
LYMPH%: 25.3 % (ref 14.0–48.0)
MCH: 29.4 pg (ref 28.0–33.4)
MCHC: 33.3 g/dL (ref 32.0–35.9)
MCV: 88 fL (ref 82–98)
MONO#: 0.8 10*3/uL (ref 0.1–0.9)
MONO%: 16.1 % — ABNORMAL HIGH (ref 0.0–13.0)
NEUT#: 2.8 10*3/uL (ref 1.5–6.5)
NEUT%: 54.2 % (ref 40.0–80.0)
Platelets: 160 10*3/uL (ref 145–400)
RBC: 4.9 10*6/uL (ref 4.20–5.70)
RDW: 15.7 % (ref 11.1–15.7)
WBC: 5.2 10*3/uL (ref 4.0–10.0)

## 2013-08-09 LAB — LACTATE DEHYDROGENASE: LDH: 181 U/L (ref 94–250)

## 2013-08-09 LAB — UA PROTEIN, DIPSTICK - CHCC SATELLITE: Protein, Urine: NEGATIVE mg/dL

## 2013-08-09 LAB — CEA: CEA: 662.9 ng/mL — ABNORMAL HIGH (ref 0.0–5.0)

## 2013-08-09 MED ORDER — LEUCOVORIN CALCIUM INJECTION 350 MG
398.0000 mg/m2 | Freq: Once | INTRAVENOUS | Status: AC
  Administered 2013-08-09: 720 mg via INTRAVENOUS
  Filled 2013-08-09: qty 36

## 2013-08-09 MED ORDER — OXALIPLATIN CHEMO INJECTION 100 MG/20ML
85.0000 mg/m2 | Freq: Once | INTRAVENOUS | Status: AC
  Administered 2013-08-09: 155 mg via INTRAVENOUS
  Filled 2013-08-09: qty 31

## 2013-08-09 MED ORDER — SODIUM CHLORIDE 0.9 % IV SOLN: 5.0000 mg/kg | Freq: Once | INTRAVENOUS | Status: DC

## 2013-08-09 MED ORDER — SODIUM CHLORIDE 0.9 % IV SOLN
2400.0000 mg/m2 | INTRAVENOUS | Status: DC
  Administered 2013-08-09: 4350 mg via INTRAVENOUS
  Filled 2013-08-09: qty 87

## 2013-08-09 MED ORDER — DEXTROSE 5 % IV SOLN
Freq: Once | INTRAVENOUS | Status: AC
  Administered 2013-08-09: 10:00:00 via INTRAVENOUS

## 2013-08-09 MED ORDER — SODIUM CHLORIDE 0.9 % IV SOLN
5.0000 mg/kg | Freq: Once | INTRAVENOUS | Status: AC
  Administered 2013-08-09: 400 mg via INTRAVENOUS
  Filled 2013-08-09: qty 16

## 2013-08-09 MED ORDER — DEXAMETHASONE SODIUM PHOSPHATE 10 MG/ML IJ SOLN
10.0000 mg | Freq: Once | INTRAMUSCULAR | Status: AC
  Administered 2013-08-09: 10 mg via INTRAVENOUS

## 2013-08-09 MED ORDER — SODIUM CHLORIDE 0.9 % IV SOLN
Freq: Once | INTRAVENOUS | Status: AC
  Administered 2013-08-09: 09:00:00 via INTRAVENOUS

## 2013-08-09 MED ORDER — HEPARIN SOD (PORK) LOCK FLUSH 100 UNIT/ML IV SOLN
500.0000 [IU] | Freq: Once | INTRAVENOUS | Status: DC | PRN
  Filled 2013-08-09: qty 5

## 2013-08-09 MED ORDER — FLUOROURACIL CHEMO INJECTION 2.5 GM/50ML
400.0000 mg/m2 | Freq: Once | INTRAVENOUS | Status: AC
  Administered 2013-08-09: 700 mg via INTRAVENOUS
  Filled 2013-08-09: qty 14

## 2013-08-09 MED ORDER — SODIUM CHLORIDE 0.9 % IJ SOLN
10.0000 mL | INTRAMUSCULAR | Status: DC | PRN
  Filled 2013-08-09: qty 10

## 2013-08-09 MED ORDER — ONDANSETRON 8 MG/50ML IVPB (CHCC)
8.0000 mg | Freq: Once | INTRAVENOUS | Status: AC
  Administered 2013-08-09: 8 mg via INTRAVENOUS

## 2013-08-09 MED ORDER — DEXAMETHASONE SODIUM PHOSPHATE 10 MG/ML IJ SOLN
INTRAMUSCULAR | Status: AC
  Filled 2013-08-09: qty 1

## 2013-08-09 NOTE — Progress Notes (Signed)
Hematology and Oncology Follow Up Visit  Ryan Maynard [id number] 07/10/1950 63 y.o. 08/09/2013   Principle Diagnosis:   Metastatic colon cancer  Current Therapy:    Status post 3 cycles of FOLFOX/Avastin     Interim History:  Mr.  Ryan Maynard is back for followup. Is doing fairly well. Chemotherapy clearly is working now. I cannot feel his liver on exam. He's going to the bathroom without any problems now. He's not constipated. He's eating better. He's working.  Most importantly is that his CEA level is down to 1300. We first saw him, he was over 4000.  He's had no cough. There's been no shortness of breath. He's had no mouth sores. He's had no leg swelling.  Medications: Current outpatient prescriptions:amLODipine-benazepril (LOTREL) 5-20 MG per capsule, Take 1 capsule by mouth daily. , Disp: , Rfl: ;  Coenzyme Q10 10 MG capsule, Take 10 mg by mouth daily., Disp: , Rfl: ;  gabapentin (NEURONTIN) 100 MG capsule, Take 100 mg by mouth 3 (three) times daily., Disp: , Rfl: ;  Lactulose 20 GM/30ML SOLN, Take 30 mLs (20 g total) by mouth 4 (four) times daily as needed., Disp: 30 mL, Rfl: 6 lidocaine-prilocaine (EMLA) cream, Apply 1 application topically as needed. Apply quarter sized amount to portacath site 1-2 hours prior to chemotherapy appt.  Cover with saran wrap., Disp: 30 g, Rfl: 3;  LORazepam (ATIVAN) 0.5 MG tablet, Take 0.5 mg by mouth as needed (Nausea or vomiting)., Disp: , Rfl: ;  dexamethasone (DECADRON) 4 MG tablet, Take 8 mg by mouth 2 (two) times daily with a meal. HR DOES NOT TAKE, Disp: , Rfl:  ondansetron (ZOFRAN) 8 MG tablet, Take 8 mg by mouth 2 (two) times daily. Take two times a day starting the day after chemo for 2 days. DOES NOT TAKE, Disp: , Rfl:   Allergies: No Known Allergies  Past Medical History, Surgical history, Social history, and Family History were reviewed and updated.  Review of Systems: As above  Physical Exam:  height is 5\' 4"  (1.626 m) and weight is 170 lb  (77.111 kg). His oral temperature is 97.8 F (36.6 C). His blood pressure is 140/81 and his pulse is 79. His respiration is 18.   1 but will first. There is no adenopathy in his neck. He is no mucus [place]. There is no ocular lesions. Lungs are clear. Cardiac exam regular in rhythm with no murmurs rubs or bruits. Abdomen is soft. Has good bowel sounds. His liver edge is now at the right costal margin. I cannot feel a cross the midline now. Has good bowel sounds. Extremities shows no clubbing cyanosis or edema. Skin exam no rashes. Neurological exam no focal neurological deficits.  Lab Results  Component Value Date   WBC 5.2 08/09/2013   HGB 14.4 08/09/2013   HCT 43.3 08/09/2013   MCV 88 08/09/2013   PLT 160 08/09/2013     Chemistry      Component Value Date/Time   NA 139 08/09/2013 0822   NA 136* 06/22/2013 0800   K 4.0 08/09/2013 0822   K 4.4 06/22/2013 0800   CL 105 08/09/2013 0822   CL 97 06/22/2013 0800   CO2 30 08/09/2013 0822   CO2 25 06/22/2013 0800   BUN 14 08/09/2013 0822   BUN 15 06/22/2013 0800   CREATININE 0.8 08/09/2013 0822   CREATININE 0.68 06/22/2013 0800      Component Value Date/Time   CALCIUM 9.2 08/09/2013 0822   CALCIUM 9.5 06/22/2013  0800   ALKPHOS 96* 08/09/2013 0822   AST 28 08/09/2013 0822   ALT 31 08/09/2013 0822   BILITOT 0.60 08/09/2013 1610         Impression and Plan: [name] is a 63 year old gentleman. He presented with metastatic colon cancer. He's responded nicely. We'll would expect his CEA level now to be less than 1000.  We will go ahead and plan for scans after this for cycle of chemotherapy.  A long talk with him about the long-term progress and plans. I told him that we ultimately would like him on some maintenance therapy. I told him we would need to continue full dose chemotherapy for now. We generally do full dose for about 4-6 months and then go to maintenance therapy. Once we get him to maintenance therapy, we can consider Xeloda with Avastin.  We'll plan for  another followup in 2 weeks.    Volanda Napoleon, MD 3/9/20159:35 AM

## 2013-08-09 NOTE — Patient Instructions (Signed)
[facility] Discharge Instructions for Patients Receiving Chemotherapy  Today you received the following chemotherapy agents Avastin, Oxaliplatin, 5FU   To help prevent nausea and vomiting after your treatment, we encourage you to take your nausea medication Zofran 8mg  daily Days 2,3,4 post chemo   If you develop nausea and vomiting that is not controlled by your nausea medication, call the clinic.   BELOW ARE SYMPTOMS THAT SHOULD BE REPORTED IMMEDIATELY:  *FEVER GREATER THAN 100.5 F  *CHILLS WITH OR WITHOUT FEVER  NAUSEA AND VOMITING THAT IS NOT CONTROLLED WITH YOUR NAUSEA MEDICATION  *UNUSUAL SHORTNESS OF BREATH  *UNUSUAL BRUISING OR BLEEDING  TENDERNESS IN MOUTH AND THROAT WITH OR WITHOUT PRESENCE OF ULCERS  *URINARY PROBLEMS  *BOWEL PROBLEMS  UNUSUAL RASH Items with * indicate a potential emergency and should be followed up as soon as possible.  Feel free to call the clinic you have any questions or concerns. The clinic phone number is [phone].

## 2013-08-09 NOTE — Telephone Encounter (Signed)
Pt aware of 3-19 9am CT at [facility] to be NPO 4 hrs and drink contrast at 7 and 8 am

## 2013-08-11 ENCOUNTER — Ambulatory Visit (HOSPITAL_BASED_OUTPATIENT_CLINIC_OR_DEPARTMENT_OTHER): Payer: BC Managed Care – PPO

## 2013-08-11 VITALS — BP 130/83 | HR 74 | Temp 97.4°F | Resp 20

## 2013-08-11 DIAGNOSIS — C787 Secondary malignant neoplasm of liver and intrahepatic bile duct: Principal | ICD-10-CM

## 2013-08-11 DIAGNOSIS — Z452 Encounter for adjustment and management of vascular access device: Secondary | ICD-10-CM

## 2013-08-11 DIAGNOSIS — C189 Malignant neoplasm of colon, unspecified: Secondary | ICD-10-CM

## 2013-08-11 MED ORDER — HEPARIN SOD (PORK) LOCK FLUSH 100 UNIT/ML IV SOLN
500.0000 [IU] | Freq: Once | INTRAVENOUS | Status: AC | PRN
  Administered 2013-08-11: 500 [IU]
  Filled 2013-08-11: qty 5

## 2013-08-11 MED ORDER — SODIUM CHLORIDE 0.9 % IJ SOLN
10.0000 mL | INTRAMUSCULAR | Status: DC | PRN
  Administered 2013-08-11: 10 mL
  Filled 2013-08-11: qty 10

## 2013-08-11 MED ORDER — COLD PACK MISC ONCOLOGY
1.0000 | Freq: Once | Status: DC | PRN
Start: 2013-08-11 — End: 2013-08-11
  Filled 2013-08-11: qty 1

## 2013-08-11 MED ORDER — SODIUM CHLORIDE 0.9 % IJ SOLN
3.0000 mL | INTRAMUSCULAR | Status: DC | PRN
  Filled 2013-08-11: qty 10

## 2013-08-11 MED ORDER — HEPARIN SOD (PORK) LOCK FLUSH 100 UNIT/ML IV SOLN
250.0000 [IU] | Freq: Once | INTRAVENOUS | Status: DC | PRN
  Filled 2013-08-11: qty 5

## 2013-08-11 MED ORDER — ALTEPLASE 2 MG IJ SOLR
2.0000 mg | Freq: Once | INTRAMUSCULAR | Status: DC | PRN
  Filled 2013-08-11: qty 2

## 2013-08-11 NOTE — Patient Instructions (Signed)

## 2013-08-23 ENCOUNTER — Ambulatory Visit (HOSPITAL_BASED_OUTPATIENT_CLINIC_OR_DEPARTMENT_OTHER): Payer: BC Managed Care – PPO

## 2013-08-23 ENCOUNTER — Other Ambulatory Visit (HOSPITAL_BASED_OUTPATIENT_CLINIC_OR_DEPARTMENT_OTHER): Payer: BC Managed Care – PPO | Admitting: Lab

## 2013-08-23 ENCOUNTER — Encounter: Payer: Self-pay | Admitting: Hematology & Oncology

## 2013-08-23 ENCOUNTER — Ambulatory Visit (HOSPITAL_BASED_OUTPATIENT_CLINIC_OR_DEPARTMENT_OTHER): Payer: BC Managed Care – PPO | Admitting: Hematology & Oncology

## 2013-08-23 VITALS — BP 141/83 | HR 74 | Temp 97.6°F | Resp 18 | Ht 64.0 in | Wt 176.0 lb

## 2013-08-23 DIAGNOSIS — C187 Malignant neoplasm of sigmoid colon: Secondary | ICD-10-CM

## 2013-08-23 DIAGNOSIS — G63 Polyneuropathy in diseases classified elsewhere: Principal | ICD-10-CM

## 2013-08-23 DIAGNOSIS — C189 Malignant neoplasm of colon, unspecified: Secondary | ICD-10-CM

## 2013-08-23 DIAGNOSIS — C801 Malignant (primary) neoplasm, unspecified: Secondary | ICD-10-CM

## 2013-08-23 DIAGNOSIS — Z5111 Encounter for antineoplastic chemotherapy: Secondary | ICD-10-CM

## 2013-08-23 DIAGNOSIS — G130 Paraneoplastic neuromyopathy and neuropathy: Secondary | ICD-10-CM

## 2013-08-23 DIAGNOSIS — D849 Immunodeficiency, unspecified: Secondary | ICD-10-CM

## 2013-08-23 DIAGNOSIS — C787 Secondary malignant neoplasm of liver and intrahepatic bile duct: Secondary | ICD-10-CM

## 2013-08-23 DIAGNOSIS — Z5112 Encounter for antineoplastic immunotherapy: Secondary | ICD-10-CM

## 2013-08-23 LAB — CMP (CANCER CENTER ONLY)
ALBUMIN: 3.5 g/dL (ref 3.3–5.5)
ALT: 32 U/L (ref 10–47)
AST: 33 U/L (ref 11–38)
Alkaline Phosphatase: 84 U/L (ref 26–84)
BUN, Bld: 12 mg/dL (ref 7–22)
CHLORIDE: 104 meq/L (ref 98–108)
CO2: 30 mEq/L (ref 18–33)
Calcium: 9.3 mg/dL (ref 8.0–10.3)
Creat: 0.8 mg/dl (ref 0.6–1.2)
Glucose, Bld: 93 mg/dL (ref 73–118)
POTASSIUM: 4.2 meq/L (ref 3.3–4.7)
SODIUM: 141 meq/L (ref 128–145)
Total Bilirubin: 0.7 mg/dl (ref 0.20–1.60)
Total Protein: 7.2 g/dL (ref 6.4–8.1)

## 2013-08-23 LAB — CBC WITH DIFFERENTIAL (CANCER CENTER ONLY)
BASO#: 0.1 10*3/uL (ref 0.0–0.2)
BASO%: 2.4 % — AB (ref 0.0–2.0)
EOS%: 1.6 % (ref 0.0–7.0)
Eosinophils Absolute: 0.1 10*3/uL (ref 0.0–0.5)
HCT: 44 % (ref 38.7–49.9)
HGB: 14.9 g/dL (ref 13.0–17.1)
LYMPH#: 1.1 10*3/uL (ref 0.9–3.3)
LYMPH%: 21.9 % (ref 14.0–48.0)
MCH: 30.2 pg (ref 28.0–33.4)
MCHC: 33.9 g/dL (ref 32.0–35.9)
MCV: 89 fL (ref 82–98)
MONO#: 0.8 10*3/uL (ref 0.1–0.9)
MONO%: 15.7 % — ABNORMAL HIGH (ref 0.0–13.0)
NEUT#: 3 10*3/uL (ref 1.5–6.5)
NEUT%: 58.4 % (ref 40.0–80.0)
PLATELETS: 159 10*3/uL (ref 145–400)
RBC: 4.93 10*6/uL (ref 4.20–5.70)
RDW: 16.6 % — ABNORMAL HIGH (ref 11.1–15.7)
WBC: 5.1 10*3/uL (ref 4.0–10.0)

## 2013-08-23 MED ORDER — DEXAMETHASONE SODIUM PHOSPHATE 10 MG/ML IJ SOLN
10.0000 mg | Freq: Once | INTRAMUSCULAR | Status: AC
  Administered 2013-08-23: 10 mg via INTRAVENOUS

## 2013-08-23 MED ORDER — DEXTROSE 5 % IV SOLN
Freq: Once | INTRAVENOUS | Status: AC
  Administered 2013-08-23: 11:00:00 via INTRAVENOUS

## 2013-08-23 MED ORDER — LEUCOVORIN CALCIUM INJECTION 350 MG
400.0000 mg/m2 | Freq: Once | INTRAMUSCULAR | Status: AC
  Administered 2013-08-23: 724 mg via INTRAVENOUS
  Filled 2013-08-23: qty 36.2

## 2013-08-23 MED ORDER — FLUOROURACIL CHEMO INJECTION 5 GM/100ML
2400.0000 mg/m2 | INTRAVENOUS | Status: DC
  Administered 2013-08-23: 4350 mg via INTRAVENOUS
  Filled 2013-08-23: qty 87

## 2013-08-23 MED ORDER — SODIUM CHLORIDE 0.9 % IJ SOLN
10.0000 mL | INTRAMUSCULAR | Status: DC | PRN
  Filled 2013-08-23: qty 10

## 2013-08-23 MED ORDER — DEXAMETHASONE SODIUM PHOSPHATE 10 MG/ML IJ SOLN
INTRAMUSCULAR | Status: AC
  Filled 2013-08-23: qty 1

## 2013-08-23 MED ORDER — SODIUM CHLORIDE 0.9 % IV SOLN
400.0000 mg | Freq: Once | INTRAVENOUS | Status: AC
  Administered 2013-08-23: 400 mg via INTRAVENOUS
  Filled 2013-08-23: qty 16

## 2013-08-23 MED ORDER — ONDANSETRON 8 MG/50ML IVPB (CHCC)
8.0000 mg | Freq: Once | INTRAVENOUS | Status: AC
  Administered 2013-08-23: 8 mg via INTRAVENOUS

## 2013-08-23 MED ORDER — OXALIPLATIN CHEMO INJECTION 100 MG/20ML
85.0000 mg/m2 | Freq: Once | INTRAVENOUS | Status: AC
  Administered 2013-08-23: 155 mg via INTRAVENOUS
  Filled 2013-08-23: qty 31

## 2013-08-23 MED ORDER — FLUOROURACIL CHEMO INJECTION 2.5 GM/50ML
400.0000 mg/m2 | Freq: Once | INTRAVENOUS | Status: AC
  Administered 2013-08-23: 700 mg via INTRAVENOUS
  Filled 2013-08-23: qty 14

## 2013-08-23 MED ORDER — HEPARIN SOD (PORK) LOCK FLUSH 100 UNIT/ML IV SOLN
500.0000 [IU] | Freq: Once | INTRAVENOUS | Status: DC | PRN
  Filled 2013-08-23: qty 5

## 2013-08-23 NOTE — Patient Instructions (Addendum)
[facility] Discharge Instructions for Patients Receiving Chemotherapy  Today you received the following chemotherapy agents 5FU, and oxaliplatin  To help prevent nausea and vomiting after your treatment, we encourage you to take your nausea medication as prescribed:    1)Zofran 8 mg  Take 1 tablet (8 mg total) by mouth 2 (two) times daily. Take two times a day starting the day after chemo for 2 days. Then take two times a day as needed for nausea or vomiting.  2) Decadron 4 mg  Take 2 tablets (8 mg total) by mouth 2 (two) times daily with a meal. Take daily starting the day after chemotherapy for 2 days. Take with food.   3) Ativan Take 1 tablet (0.5 mg total) by mouth every 6 (six) hours as needed (Nausea or vomiting).  4) Compazine 10 mg.  Take 1 tablet by mouth every 6 hours as needed for nausea and vomiting      If you develop nausea and vomiting that is not controlled by your nausea medication, call the clinic. If it is after clinic hours your family physician or the after hours number for the clinic or go to the Emergency Department.   BELOW ARE SYMPTOMS THAT SHOULD BE REPORTED IMMEDIATELY:  *FEVER GREATER THAN 100.5 F  *CHILLS WITH OR WITHOUT FEVER  NAUSEA AND VOMITING THAT IS NOT CONTROLLED WITH YOUR NAUSEA MEDICATION  *UNUSUAL SHORTNESS OF BREATH  *UNUSUAL BRUISING OR BLEEDING  TENDERNESS IN MOUTH AND THROAT WITH OR WITHOUT PRESENCE OF ULCERS  *URINARY PROBLEMS  *BOWEL PROBLEMS  UNUSUAL RASH Items with * indicate a potential emergency and should be followed up as soon as possible.  One of the nurses will contact you 24 hours after your treatment. Please let the nurse know about any problems that you may have experienced. Feel free to call the clinic you have any questions or concerns. The clinic phone number is [phone].   I have been informed and understand all the instructions given to me. I know to contact the clinic, my physician, or go to  the Emergency Department if any problems should occur. I do not have any questions at this time, but understand that I may call the clinic during office hours   should I have any questions or need assistance in obtaining follow up care.    __________________________________________  _____________  __________ Signature of Patient or Authorized Representative            Date                   Time    __________________________________________ Nurse's Signature

## 2013-08-24 LAB — CEA: CEA: 411.6 ng/mL — ABNORMAL HIGH (ref 0.0–5.0)

## 2013-08-24 LAB — LACTATE DEHYDROGENASE: LDH: 183 U/L (ref 94–250)

## 2013-08-24 MED ORDER — VENLAFAXINE HCL 37.5 MG PO TABS: ORAL_TABLET | ORAL | Status: DC

## 2013-08-24 NOTE — Progress Notes (Signed)
Hematology and Oncology Follow Up Visit  Ryan Maynard [id number] 1951/01/30 63 y.o. 08/24/2013   Principle Diagnosis:   Metastatic colon cancer  Current Therapy:    4 cycles of FOLFOX/Avastin     Interim History:  Ryan Maynard is come back for followup. Not surprisingly, he is doing well. He looks great. He is eating well. He's having no problems with bowels or bladder. He's had no pain. There is no cough or shortness of breath. He's working.  He tolerated chemotherapy very nicely. He's had no bleeding. He's had no leg swelling. 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 He also has some chewing problems. 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  His CEA has come down very nicely. We first started, his CEA was over 4000. Today, is now down to 441.  His performance status has improved nicely. It is now in ECoG 1  Medications: Current outpatient prescriptions:amLODipine-benazepril (LOTREL) 5-20 MG per capsule, Take 1 capsule by mouth daily. , Disp: , Rfl: ;  Coenzyme Q10 10 MG capsule, Take 10 mg by mouth daily., Disp: , Rfl: ;  gabapentin (NEURONTIN) 100 MG capsule, Take 100 mg by mouth 3 (three) times daily., Disp: , Rfl:  lidocaine-prilocaine (EMLA) cream, Apply 1 application topically as needed. Apply quarter sized amount to portacath site 1-2 hours prior to chemotherapy appt.  Cover with saran wrap., Disp: 30 g, Rfl: 3;  LORazepam (ATIVAN) 0.5 MG tablet, Take 0.5 mg by mouth as needed (Nausea or vomiting)., Disp: , Rfl:  ondansetron (ZOFRAN) 8 MG tablet, Take 8 mg by mouth 2 (two) times daily. Take two times a day starting the day after chemo for 2 days. DOES NOT TAKE, Disp: , Rfl: ;  dexamethasone (DECADRON) 4 MG tablet, Take 8 mg by mouth 2 (two) times daily with a meal. HR DOES NOT TAKE, Disp: , Rfl: ;  Lactulose 20 GM/30ML SOLN, Take 30 mLs (20 g total) by mouth 4 (four) times daily as needed., Disp: 30 mL, Rfl:  6 venlafaxine (EFFEXOR) 37.5 MG tablet, Take 1 pill a day for 7 days., Disp: 28 tablet, Rfl: 1  Allergies: No Known Allergies  Past Medical History, Surgical history, Social history, and Family History were reviewed and updated.  Review of Systems: As above  Physical Exam:  height is 5\' 4"  (1.626 m) and weight is 176 lb (79.833 kg). His oral temperature is 97.6 F (36.4 C). His blood pressure is 141/83 and his pulse is 74. His respiration is 18.   Well-developed well-nourished gentleman. There is no scleral icterus. There is no oral lesion. There is no adenopathy in the neck. Lungs are clear. Cardiac exam regular in rhythm with no murmurs rubs or bruits. Abdomen is soft. Has good bowel sounds. There is no fluid wave. I really cannot palpate his liver. There is no spleen tip. Back exam no tenderness over the spine. Extremities no clubbing cyanosis or edema. Skin exam no rashes. Neurological exam shows no deficits.  Lab Results  Component Value Date   WBC 5.1 08/23/2013   HGB 14.9 08/23/2013   HCT 44.0 08/23/2013   MCV 89 08/23/2013   PLT 159 08/23/2013     Chemistry      Component Value Date/Time   NA 141 08/23/2013 0907   NA 136* 06/22/2013 0800   K 4.2 08/23/2013 0907   K 4.4 06/22/2013 0800   CL 104 08/23/2013 0907  CL 97 06/22/2013 0800   CO2 30 08/23/2013 0907   CO2 25 06/22/2013 0800   BUN 12 08/23/2013 0907   BUN 15 06/22/2013 0800   CREATININE 0.8 08/23/2013 0907   CREATININE 0.68 06/22/2013 0800      Component Value Date/Time   CALCIUM 9.3 08/23/2013 0907   CALCIUM 9.5 06/22/2013 0800   ALKPHOS 84 08/23/2013 0907   AST 33 08/23/2013 0907   ALT 32 08/23/2013 0907   BILITOT 0.70 08/23/2013 0907         Impression and Plan: Ryan Maynard is a 63 year old gentleman. He has metastatic colon cancer. He came in with an incredibly extensive hepatic disease. His liver was quite large. He has responded very nicely. His quality of life is better. He's working. He is eating better. He just  looks so much more healthy.  We will continue with chemotherapy. Hopefully, the Effexor will help with the neuropathy.  I will plan for a total of 8 cycles of FOLFOX/Avastin. We then will repeat the CT scans. If he continues to improve, then I will consider him for a maintenance therapy with Xeloda/Avastin.  I  spent over a half hour with he and his wife. I looked at the scans with them. I showed them the differences and the improvement. I went over my recommendations. I answered all her questions. That we will get him back in 2 days.   Volanda Napoleon, MD 3/24/20152:04 PM

## 2013-08-25 ENCOUNTER — Ambulatory Visit (HOSPITAL_BASED_OUTPATIENT_CLINIC_OR_DEPARTMENT_OTHER): Payer: BC Managed Care – PPO

## 2013-08-25 VITALS — BP 131/81 | HR 77 | Temp 98.1°F | Resp 20

## 2013-08-25 DIAGNOSIS — C787 Secondary malignant neoplasm of liver and intrahepatic bile duct: Principal | ICD-10-CM

## 2013-08-25 DIAGNOSIS — C187 Malignant neoplasm of sigmoid colon: Secondary | ICD-10-CM

## 2013-08-25 DIAGNOSIS — C189 Malignant neoplasm of colon, unspecified: Secondary | ICD-10-CM

## 2013-08-25 DIAGNOSIS — Z452 Encounter for adjustment and management of vascular access device: Secondary | ICD-10-CM

## 2013-08-25 MED ORDER — SODIUM CHLORIDE 0.9 % IJ SOLN
10.0000 mL | INTRAMUSCULAR | Status: DC | PRN
  Administered 2013-08-25: 10 mL
  Filled 2013-08-25: qty 10

## 2013-08-25 MED ORDER — HEPARIN SOD (PORK) LOCK FLUSH 100 UNIT/ML IV SOLN
500.0000 [IU] | Freq: Once | INTRAVENOUS | Status: AC | PRN
  Administered 2013-08-25: 500 [IU]
  Filled 2013-08-25: qty 5

## 2013-08-25 NOTE — Patient Instructions (Signed)
Chemotherapy  Many people are apprehensive about chemotherapy due to concerns over uncomfortable side effects. However, managements for side effects have come a long way. Many side effects once associated with chemotherapy can be prevented and/or controlled.  WHAT IS CHEMOTHERAPY?  Chemotherapy is the general term for any treatment involving the use of chemical agents. Chemotherapy can be given through a vein, most commonly through an implanted port* or PICC line.* It can also be delivered by mouth (orally) in the form of a pill. The main goal of chemotherapy is to kill cancer cells and stop them from growing. It can destroy and eliminate cancer cells where the cancer started (primary tumor location) and throughout the body, often far away from the original cancer. It is a treatment that not only targets the original cancer location, but also the entire body (systemic treatment) for full effect and results.  Chemotherapy works by destroying cancer cells. Unfortunately, it cannot tell the difference between a cancer cell and some healthy cells. This results in the death of noncancerous cells, such as hair and blood cells. Harm to healthy cells is what causes side effects. These cells usually repair themselves after chemotherapy.  Because some drugs work better together rather than alone, 2 or more drugs are often given at the same time. This is called combination chemotherapy.  Depending on the type of cancer and how advanced it is, chemotherapy can be used for different goals:   Cure the cancer.   Keep the cancer from spreading.   Slow the cancer's growth.   Kill cancer cells that may have spread to other parts of the body from the original tumor.   Relieve symptoms caused by cancer.  You and your caregiver will decide what drug or combination of drugs you will get. Your caregiver will choose the doses, how the drugs will be given, how often, and how long you will get treatment. All of these decisions will  depend on the type of cancer, where it is, how big it is, and how it is affecting your normal body functions and overall health.  *Implanted port - A device that is implanted under your skin so that medicines may be delivered directly into your blood system.  *PICC line (peripherally inserted central catheter) - A long, slender, flexible tube. This tube is often inserted into a vein, typically in the upper arm. The tip stops in the large central vein that leads to your heart.  Document Released: 03/17/2007 Document Revised: 08/12/2011 Document Reviewed: 09/01/2008  [employer] Patient Information 2014 [employer]

## 2013-08-27 ENCOUNTER — Encounter: Payer: Self-pay | Admitting: Hematology & Oncology

## 2013-08-31 ENCOUNTER — Telehealth: Payer: Self-pay

## 2013-08-31 NOTE — Telephone Encounter (Addendum)
Message copied by Johny Drilling on Tue Aug 31, 2013 11:43 AM ------      Message from: Burney Gauze R      Created: Mon Aug 30, 2013 10:13 PM       Call - CEA is still coming down.!!!!!!  Now is 32!! ------ This message left on personalized home answering machine. dph

## 2013-09-06 ENCOUNTER — Ambulatory Visit (HOSPITAL_BASED_OUTPATIENT_CLINIC_OR_DEPARTMENT_OTHER): Payer: BC Managed Care – PPO | Admitting: Hematology & Oncology

## 2013-09-06 ENCOUNTER — Other Ambulatory Visit (HOSPITAL_BASED_OUTPATIENT_CLINIC_OR_DEPARTMENT_OTHER): Payer: BC Managed Care – PPO | Admitting: Lab

## 2013-09-06 ENCOUNTER — Encounter: Payer: Self-pay | Admitting: Hematology & Oncology

## 2013-09-06 ENCOUNTER — Ambulatory Visit (HOSPITAL_BASED_OUTPATIENT_CLINIC_OR_DEPARTMENT_OTHER): Payer: BC Managed Care – PPO

## 2013-09-06 VITALS — BP 139/88 | HR 75 | Temp 98.0°F | Resp 18 | Ht 64.0 in | Wt 178.0 lb

## 2013-09-06 DIAGNOSIS — C187 Malignant neoplasm of sigmoid colon: Secondary | ICD-10-CM

## 2013-09-06 DIAGNOSIS — Z5111 Encounter for antineoplastic chemotherapy: Secondary | ICD-10-CM

## 2013-09-06 DIAGNOSIS — C787 Secondary malignant neoplasm of liver and intrahepatic bile duct: Secondary | ICD-10-CM

## 2013-09-06 DIAGNOSIS — C189 Malignant neoplasm of colon, unspecified: Secondary | ICD-10-CM

## 2013-09-06 DIAGNOSIS — Z5112 Encounter for antineoplastic immunotherapy: Secondary | ICD-10-CM

## 2013-09-06 LAB — CBC WITH DIFFERENTIAL (CANCER CENTER ONLY)
BASO#: 0.1 10*3/uL (ref 0.0–0.2)
BASO%: 2.5 % — ABNORMAL HIGH (ref 0.0–2.0)
EOS%: 1.9 % (ref 0.0–7.0)
Eosinophils Absolute: 0.1 10*3/uL (ref 0.0–0.5)
HEMATOCRIT: 44.6 % (ref 38.7–49.9)
HEMOGLOBIN: 15.4 g/dL (ref 13.0–17.1)
LYMPH#: 1.1 10*3/uL (ref 0.9–3.3)
LYMPH%: 23.5 % (ref 14.0–48.0)
MCH: 30.7 pg (ref 28.0–33.4)
MCHC: 34.5 g/dL (ref 32.0–35.9)
MCV: 89 fL (ref 82–98)
MONO#: 0.8 10*3/uL (ref 0.1–0.9)
MONO%: 16 % — AB (ref 0.0–13.0)
NEUT#: 2.7 10*3/uL (ref 1.5–6.5)
NEUT%: 56.1 % (ref 40.0–80.0)
Platelets: 169 10*3/uL (ref 145–400)
RBC: 5.01 10*6/uL (ref 4.20–5.70)
RDW: 17.2 % — AB (ref 11.1–15.7)
WBC: 4.8 10*3/uL (ref 4.0–10.0)

## 2013-09-06 LAB — CMP (CANCER CENTER ONLY)
ALT: 30 U/L (ref 10–47)
AST: 37 U/L (ref 11–38)
Albumin: 3.5 g/dL (ref 3.3–5.5)
Alkaline Phosphatase: 97 U/L — ABNORMAL HIGH (ref 26–84)
BUN, Bld: 14 mg/dL (ref 7–22)
CO2: 29 mEq/L (ref 18–33)
CREATININE: 0.9 mg/dL (ref 0.6–1.2)
Calcium: 9.4 mg/dL (ref 8.0–10.3)
Chloride: 103 mEq/L (ref 98–108)
Glucose, Bld: 97 mg/dL (ref 73–118)
Potassium: 4.4 mEq/L (ref 3.3–4.7)
Sodium: 142 mEq/L (ref 128–145)
Total Bilirubin: 0.7 mg/dl (ref 0.20–1.60)
Total Protein: 8 g/dL (ref 6.4–8.1)

## 2013-09-06 LAB — CEA: CEA: 261.5 ng/mL — AB (ref 0.0–5.0)

## 2013-09-06 LAB — UA PROTEIN, DIPSTICK - CHCC SATELLITE: Protein, Urine: NEGATIVE mg/dL

## 2013-09-06 MED ORDER — ONDANSETRON 8 MG/50ML IVPB (CHCC)
8.0000 mg | Freq: Once | INTRAVENOUS | Status: AC
  Administered 2013-09-06: 8 mg via INTRAVENOUS

## 2013-09-06 MED ORDER — DEXTROSE 5 % IV SOLN
Freq: Once | INTRAVENOUS | Status: AC
  Administered 2013-09-06: 11:00:00 via INTRAVENOUS

## 2013-09-06 MED ORDER — DEXAMETHASONE SODIUM PHOSPHATE 10 MG/ML IJ SOLN
INTRAMUSCULAR | Status: AC
  Filled 2013-09-06: qty 1

## 2013-09-06 MED ORDER — FLUOROURACIL CHEMO INJECTION 2.5 GM/50ML
400.0000 mg/m2 | Freq: Once | INTRAVENOUS | Status: AC
  Administered 2013-09-06: 700 mg via INTRAVENOUS
  Filled 2013-09-06: qty 14

## 2013-09-06 MED ORDER — HEPARIN SOD (PORK) LOCK FLUSH 100 UNIT/ML IV SOLN
250.0000 [IU] | Freq: Once | INTRAVENOUS | Status: DC | PRN
  Filled 2013-09-06: qty 5

## 2013-09-06 MED ORDER — SODIUM CHLORIDE 0.9 % IJ SOLN
10.0000 mL | INTRAMUSCULAR | Status: DC | PRN
  Filled 2013-09-06: qty 10

## 2013-09-06 MED ORDER — BEVACIZUMAB CHEMO INJECTION 400 MG/16ML
5.0000 mg/kg | Freq: Once | INTRAVENOUS | Status: AC
  Administered 2013-09-06: 400 mg via INTRAVENOUS
  Filled 2013-09-06: qty 16

## 2013-09-06 MED ORDER — DEXTROSE 5 % IV SOLN
400.0000 mg/m2 | Freq: Once | INTRAVENOUS | Status: AC
  Administered 2013-09-06: 724 mg via INTRAVENOUS
  Filled 2013-09-06: qty 36.2

## 2013-09-06 MED ORDER — OXALIPLATIN CHEMO INJECTION 100 MG/20ML
85.0000 mg/m2 | Freq: Once | INTRAVENOUS | Status: AC
  Administered 2013-09-06: 155 mg via INTRAVENOUS
  Filled 2013-09-06: qty 31

## 2013-09-06 MED ORDER — SODIUM CHLORIDE 0.9 % IV SOLN
2400.0000 mg/m2 | INTRAVENOUS | Status: DC
  Administered 2013-09-06: 4350 mg via INTRAVENOUS
  Filled 2013-09-06: qty 87

## 2013-09-06 MED ORDER — DEXAMETHASONE SODIUM PHOSPHATE 10 MG/ML IJ SOLN
10.0000 mg | Freq: Once | INTRAMUSCULAR | Status: AC
  Administered 2013-09-06: 10 mg via INTRAVENOUS

## 2013-09-06 MED ORDER — HEPARIN SOD (PORK) LOCK FLUSH 100 UNIT/ML IV SOLN
500.0000 [IU] | Freq: Once | INTRAVENOUS | Status: DC | PRN
  Filled 2013-09-06: qty 5

## 2013-09-06 NOTE — Patient Instructions (Signed)
Bevacizumab injection What is this medicine? BEVACIZUMAB (be va SIZ yoo mab) is a chemotherapy drug. It targets a protein found in many cancer cell types, and halts cancer growth. This drug treats many cancers including non-small cell lung cancer, and colon or rectal cancer. It is usually given with other chemotherapy drugs. This medicine may be used for other purposes; ask your health care provider or pharmacist if you have questions. COMMON BRAND NAME(S): Avastin What should I tell my health care provider before I take this medicine? They need to know if you have any of these conditions: -blood clots -heart disease, including heart failure, heart attack, or chest pain (angina) -high blood pressure -infection (especially a virus infection such as chickenpox, cold sores, or herpes) -kidney disease -lung disease -prior chemotherapy with doxorubicin, daunorubicin, epirubicin, or other anthracycline type chemotherapy agents -recent or ongoing radiation therapy -recent surgery -stroke -an unusual or allergic reaction to bevacizumab, hamster proteins, mouse proteins, other medicines, foods, dyes, or preservatives -pregnant or trying to get pregnant -breast-feeding How should I use this medicine? This medicine is for infusion into a vein. It is given by a health care professional in a hospital or clinic setting. Talk to your pediatrician regarding the use of this medicine in children. Special care may be needed. Overdosage: If you think you have taken too much of this medicine contact a poison control center or emergency room at once. NOTE: This medicine is only for you. Do not share this medicine with others. What if I miss a dose? It is important not to miss your dose. Call your doctor or health care professional if you are unable to keep an appointment. What may interact with this medicine? Interactions are not expected. This list may not describe all possible interactions. Give your health  care provider a list of all the medicines, herbs, non-prescription drugs, or dietary supplements you use. Also tell them if you smoke, drink alcohol, or use illegal drugs. Some items may interact with your medicine. What should I watch for while using this medicine? Your condition will be monitored carefully while you are receiving this medicine. You will need important blood work and urine testing done while you are taking this medicine. During your treatment, let your health care professional know if you have any unusual symptoms, such as difficulty breathing. This medicine may rarely cause 'gastrointestinal perforation' (holes in the stomach, intestines or colon), a serious side effect requiring surgery to repair. This medicine should be started at least 28 days following major surgery and the site of the surgery should be totally healed. Check with your doctor before scheduling dental work or surgery while you are receiving this treatment. Talk to your doctor if you have recently had surgery or if you have a wound that has not healed. Do not become pregnant while taking this medicine. Women should inform their doctor if they wish to become pregnant or think they might be pregnant. There is a potential for serious side effects to an unborn child. Talk to your health care professional or pharmacist for more information. Do not breast-feed an infant while taking this medicine. This medicine has caused ovarian failure in some women. This medicine may interfere with the ability to have a child. You should talk to your doctor or health care professional if you are concerned about your fertility. What side effects may I notice from receiving this medicine? Side effects that you should report to your doctor or health care professional as soon as possible: -  allergic reactions like skin rash, itching or hives, swelling of the face, lips, or tongue -signs of infection - fever or chills, cough, sore throat, pain  or trouble passing urine -signs of decreased platelets or bleeding - bruising, pinpoint red spots on the skin, black, tarry stools, nosebleeds, blood in the urine -breathing problems -changes in vision -chest pain -confusion -jaw pain, especially after dental work -mouth sores -seizures -severe abdominal pain -severe headache -sudden numbness or weakness of the face, arm or leg -swelling of legs or ankles -symptoms of a stroke: change in mental awareness, inability to talk or move one side of the body (especially in patients with lung cancer) -trouble passing urine or change in the amount of urine -trouble speaking or understanding -trouble walking, dizziness, loss of balance or coordination Side effects that usually do not require medical attention (report to your doctor or health care professional if they continue or are bothersome): -constipation -diarrhea -dry skin -headache -loss of appetite -nausea, vomiting This list may not describe all possible side effects. Call your doctor for medical advice about side effects. You may report side effects to FDA at 1-800-FDA-1088. Where should I keep my medicine? This drug is given in a hospital or clinic and will not be stored at home. NOTE: This sheet is a summary. It may not cover all possible information. If you have questions about this medicine, talk to your doctor, pharmacist, or health care provider.  2014, [employer]. (2010-04-20 16:25:37) Fluorouracil, 5-FU injection What is this medicine? FLUOROURACIL, 5-FU (flure oh YOOR a sil) is a chemotherapy drug. It slows the growth of cancer cells. This medicine is used to treat many types of cancer like breast cancer, colon or rectal cancer, pancreatic cancer, and stomach cancer. This medicine may be used for other purposes; ask your health care provider or pharmacist if you have questions. COMMON BRAND NAME(S): Adrucil What should I tell my health care provider before I  take this medicine? They need to know if you have any of these conditions: -blood disorders -dihydropyrimidine dehydrogenase (DPD) deficiency -infection (especially a virus infection such as chickenpox, cold sores, or herpes) -kidney disease -liver disease -malnourished, poor nutrition -recent or ongoing radiation therapy -an unusual or allergic reaction to fluorouracil, other chemotherapy, other medicines, foods, dyes, or preservatives -pregnant or trying to get pregnant -breast-feeding How should I use this medicine? This drug is given as an infusion or injection into a vein. It is administered in a hospital or clinic by a specially trained health care professional. Talk to your pediatrician regarding the use of this medicine in children. Special care may be needed. Overdosage: If you think you have taken too much of this medicine contact a poison control center or emergency room at once. NOTE: This medicine is only for you. Do not share this medicine with others. What if I miss a dose? It is important not to miss your dose. Call your doctor or health care professional if you are unable to keep an appointment. What may interact with this medicine? -allopurinol -cimetidine -dapsone -digoxin -hydroxyurea -leucovorin -levamisole -medicines for seizures like ethotoin, fosphenytoin, phenytoin -medicines to increase blood counts like filgrastim, pegfilgrastim, sargramostim -medicines that treat or prevent blood clots like warfarin, enoxaparin, and dalteparin -methotrexate -metronidazole -pyrimethamine -some other chemotherapy drugs like busulfan, cisplatin, estramustine, vinblastine -trimethoprim -trimetrexate -vaccines Talk to your doctor or health care professional before taking any of these medicines: -acetaminophen -aspirin -ibuprofen -ketoprofen -naproxen This list may not describe all possible interactions. Give your  health care provider a list of all the medicines,  herbs, non-prescription drugs, or dietary supplements you use. Also tell them if you smoke, drink alcohol, or use illegal drugs. Some items may interact with your medicine. What should I watch for while using this medicine? Visit your doctor for checks on your progress. This drug may make you feel generally unwell. This is not uncommon, as chemotherapy can affect healthy cells as well as cancer cells. Report any side effects. Continue your course of treatment even though you feel ill unless your doctor tells you to stop. In some cases, you may be given additional medicines to help with side effects. Follow all directions for their use. Call your doctor or health care professional for advice if you get a fever, chills or sore throat, or other symptoms of a cold or flu. Do not treat yourself. This drug decreases your body's ability to fight infections. Try to avoid being around people who are sick. This medicine may increase your risk to bruise or bleed. Call your doctor or health care professional if you notice any unusual bleeding. Be careful brushing and flossing your teeth or using a toothpick because you may get an infection or bleed more easily. If you have any dental work done, tell your dentist you are receiving this medicine. Avoid taking products that contain aspirin, acetaminophen, ibuprofen, naproxen, or ketoprofen unless instructed by your doctor. These medicines may hide a fever. Do not become pregnant while taking this medicine. Women should inform their doctor if they wish to become pregnant or think they might be pregnant. There is a potential for serious side effects to an unborn child. Talk to your health care professional or pharmacist for more information. Do not breast-feed an infant while taking this medicine. Men should inform their doctor if they wish to father a child. This medicine may lower sperm counts. Do not treat diarrhea with over the counter products. Contact your doctor if  you have diarrhea that lasts more than 2 days or if it is severe and watery. This medicine can make you more sensitive to the sun. Keep out of the sun. If you cannot avoid being in the sun, wear protective clothing and use sunscreen. Do not use sun lamps or tanning beds/booths. What side effects may I notice from receiving this medicine? Side effects that you should report to your doctor or health care professional as soon as possible: -allergic reactions like skin rash, itching or hives, swelling of the face, lips, or tongue -low blood counts - this medicine may decrease the number of white blood cells, red blood cells and platelets. You may be at increased risk for infections and bleeding. -signs of infection - fever or chills, cough, sore throat, pain or difficulty passing urine -signs of decreased platelets or bleeding - bruising, pinpoint red spots on the skin, black, tarry stools, blood in the urine -signs of decreased red blood cells - unusually weak or tired, fainting spells, lightheadedness -breathing problems -changes in vision -chest pain -mouth sores -nausea and vomiting -pain, swelling, redness at site where injected -pain, tingling, numbness in the hands or feet -redness, swelling, or sores on hands or feet -stomach pain -unusual bleeding Side effects that usually do not require medical attention (report to your doctor or health care professional if they continue or are bothersome): -changes in finger or toe nails -diarrhea -dry or itchy skin -hair loss -headache -loss of appetite -sensitivity of eyes to the light -stomach upset -unusually teary eyes  This list may not describe all possible side effects. Call your doctor for medical advice about side effects. You may report side effects to FDA at 1-800-FDA-1088. Where should I keep my medicine? This drug is given in a hospital or clinic and will not be stored at home. NOTE: This sheet is a summary. It may not cover all  possible information. If you have questions about this medicine, talk to your doctor, pharmacist, or health care provider.  2014, [employer]. (2007-09-23 13:53:16) Oxaliplatin Injection What is this medicine? OXALIPLATIN (ox AL i PLA tin) is a chemotherapy drug. It targets fast dividing cells, like cancer cells, and causes these cells to die. This medicine is used to treat cancers of the colon and rectum, and many other cancers. This medicine may be used for other purposes; ask your health care provider or pharmacist if you have questions. COMMON BRAND NAME(S): Eloxatin What should I tell my health care provider before I take this medicine? They need to know if you have any of these conditions: -kidney disease -an unusual or allergic reaction to oxaliplatin, other chemotherapy, other medicines, foods, dyes, or preservatives -pregnant or trying to get pregnant -breast-feeding How should I use this medicine? This drug is given as an infusion into a vein. It is administered in a hospital or clinic by a specially trained health care professional. Talk to your pediatrician regarding the use of this medicine in children. Special care may be needed. Overdosage: If you think you have taken too much of this medicine contact a poison control center or emergency room at once. NOTE: This medicine is only for you. Do not share this medicine with others. What if I miss a dose? It is important not to miss a dose. Call your doctor or health care professional if you are unable to keep an appointment. What may interact with this medicine? -medicines to increase blood counts like filgrastim, pegfilgrastim, sargramostim -probenecid -some antibiotics like amikacin, gentamicin, neomycin, polymyxin B, streptomycin, tobramycin -zalcitabine Talk to your doctor or health care professional before taking any of these medicines: -acetaminophen -aspirin -ibuprofen -ketoprofen -naproxen This list may  not describe all possible interactions. Give your health care provider a list of all the medicines, herbs, non-prescription drugs, or dietary supplements you use. Also tell them if you smoke, drink alcohol, or use illegal drugs. Some items may interact with your medicine. What should I watch for while using this medicine? Your condition will be monitored carefully while you are receiving this medicine. You will need important blood work done while you are taking this medicine. This medicine can make you more sensitive to cold. Do not drink cold drinks or use ice. Cover exposed skin before coming in contact with cold temperatures or cold objects. When out in cold weather wear warm clothing and cover your mouth and nose to warm the air that goes into your lungs. Tell your doctor if you get sensitive to the cold. This drug may make you feel generally unwell. This is not uncommon, as chemotherapy can affect healthy cells as well as cancer cells. Report any side effects. Continue your course of treatment even though you feel ill unless your doctor tells you to stop. In some cases, you may be given additional medicines to help with side effects. Follow all directions for their use. Call your doctor or health care professional for advice if you get a fever, chills or sore throat, or other symptoms of a cold or flu. Do not treat yourself. This  drug decreases your body's ability to fight infections. Try to avoid being around people who are sick. This medicine may increase your risk to bruise or bleed. Call your doctor or health care professional if you notice any unusual bleeding. Be careful brushing and flossing your teeth or using a toothpick because you may get an infection or bleed more easily. If you have any dental work done, tell your dentist you are receiving this medicine. Avoid taking products that contain aspirin, acetaminophen, ibuprofen, naproxen, or ketoprofen unless instructed by your doctor. These  medicines may hide a fever. Do not become pregnant while taking this medicine. Women should inform their doctor if they wish to become pregnant or think they might be pregnant. There is a potential for serious side effects to an unborn child. Talk to your health care professional or pharmacist for more information. Do not breast-feed an infant while taking this medicine. Call your doctor or health care professional if you get diarrhea. Do not treat yourself. What side effects may I notice from receiving this medicine? Side effects that you should report to your doctor or health care professional as soon as possible: -allergic reactions like skin rash, itching or hives, swelling of the face, lips, or tongue -low blood counts - This drug may decrease the number of white blood cells, red blood cells and platelets. You may be at increased risk for infections and bleeding. -signs of infection - fever or chills, cough, sore throat, pain or difficulty passing urine -signs of decreased platelets or bleeding - bruising, pinpoint red spots on the skin, black, tarry stools, nosebleeds -signs of decreased red blood cells - unusually weak or tired, fainting spells, lightheadedness -breathing problems -chest pain, pressure -cough -diarrhea -jaw tightness -mouth sores -nausea and vomiting -pain, swelling, redness or irritation at the injection site -pain, tingling, numbness in the hands or feet -problems with balance, talking, walking -redness, blistering, peeling or loosening of the skin, including inside the mouth -trouble passing urine or change in the amount of urine Side effects that usually do not require medical attention (report to your doctor or health care professional if they continue or are bothersome): -changes in vision -constipation -hair loss -loss of appetite -metallic taste in the mouth or changes in taste -stomach pain This list may not describe all possible side effects. Call your  doctor for medical advice about side effects. You may report side effects to FDA at 1-800-FDA-1088. Where should I keep my medicine? This drug is given in a hospital or clinic and will not be stored at home. NOTE: This sheet is a summary. It may not cover all possible information. If you have questions about this medicine, talk to your doctor, pharmacist, or health care provider.  2014, [employer]. (2007-12-15 17:22:47) Leucovorin injection What is this medicine? LEUCOVORIN (loo koe VOR in) is used to prevent or treat the harmful effects of some medicines. This medicine is used to treat anemia caused by a low amount of folic acid in the body. It is also used with 5-fluorouracil (5-FU) to treat colon cancer. This medicine may be used for other purposes; ask your health care provider or pharmacist if you have questions. What should I tell my health care provider before I take this medicine? They need to know if you have any of these conditions: -anemia from low levels of vitamin B-12 in the blood -an unusual or allergic reaction to leucovorin, folic acid, other medicines, foods, dyes, or preservatives -pregnant or trying to get  pregnant -breast-feeding How should I use this medicine? This medicine is for injection into a muscle or into a vein. It is given by a health care professional in a hospital or clinic setting. Talk to your pediatrician regarding the use of this medicine in children. Special care may be needed. Overdosage: If you think you have taken too much of this medicine contact a poison control center or emergency room at once. NOTE: This medicine is only for you. Do not share this medicine with others. What if I miss a dose? This does not apply. What may interact with this medicine? -capecitabine -fluorouracil -phenobarbital -phenytoin -primidone -trimethoprim-sulfamethoxazole This list may not describe all possible interactions. Give your health care provider a  list of all the medicines, herbs, non-prescription drugs, or dietary supplements you use. Also tell them if you smoke, drink alcohol, or use illegal drugs. Some items may interact with your medicine. What should I watch for while using this medicine? Your condition will be monitored carefully while you are receiving this medicine. This medicine may increase the side effects of 5-fluorouracil, 5-FU. Tell your doctor or health care professional if you have diarrhea or mouth sores that do not get better or that get worse. What side effects may I notice from receiving this medicine? Side effects that you should report to your doctor or health care professional as soon as possible: -allergic reactions like skin rash, itching or hives, swelling of the face, lips, or tongue -breathing problems -fever, infection -mouth sores -unusual bleeding or bruising -unusually weak or tired Side effects that usually do not require medical attention (report to your doctor or health care professional if they continue or are bothersome): -constipation or diarrhea -loss of appetite -nausea, vomiting This list may not describe all possible side effects. Call your doctor for medical advice about side effects. You may report side effects to FDA at 1-800-FDA-1088. Where should I keep my medicine? This drug is given in a hospital or clinic and will not be stored at home. NOTE: This sheet is a summary. It may not cover all possible information. If you have questions about this medicine, talk to your doctor, pharmacist, or health care provider.  2014, [employer]. (2007-11-24 16:50:29)

## 2013-09-06 NOTE — Progress Notes (Signed)
Hematology and Oncology Follow Up Visit  Ryan Maynard [id number] April 27, 1951 63 y.o. 09/06/2013   Principle Diagnosis:  Metastatic colon cancer  Current Therapy:    Status post 5 cycles of FOLFOX/Avastin     Interim History:  [name] is back for followup. He's doing well. 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 This seems to be maybe a little better. I did order some Effexor for him but he never got this. I Mr. as to why. I see that he is on Neurontin. I will have to talk to him about the Neurontin.  His CEA continues to improve. His last CEA was down to 411.  He's eating well. He's had no problems with bowels or bladder. He's had no cough. He's had no rashes. There's been no fever. He's had no mouth sores.  Overall, his performance status is ECOG 1.  Medications: Current outpatient prescriptions:amLODipine-benazepril (LOTREL) 5-20 MG per capsule, Take 1 capsule by mouth daily. , Disp: , Rfl: ;  Coenzyme Q10 10 MG capsule, Take 10 mg by mouth daily., Disp: , Rfl: ;  gabapentin (NEURONTIN) 100 MG capsule, Take 100 mg by mouth 3 (three) times daily., Disp: , Rfl:  lidocaine-prilocaine (EMLA) cream, Apply 1 application topically as needed. Apply quarter sized amount to portacath site 1-2 hours prior to chemotherapy appt.  Cover with saran wrap., Disp: 30 g, Rfl: 3;  dexamethasone (DECADRON) 4 MG tablet, Take 8 mg by mouth 2 (two) times daily with a meal. HR DOES NOT TAKE, Disp: , Rfl: ;  LORazepam (ATIVAN) 0.5 MG tablet, Take 0.5 mg by mouth as needed (Nausea or vomiting)., Disp: , Rfl:  ondansetron (ZOFRAN) 8 MG tablet, Take 8 mg by mouth 2 (two) times daily. Take two times a day starting the day after chemo for 2 days. DOES NOT TAKE, Disp: , Rfl:   Allergies: No Known Allergies  Past Medical History, Surgical history, Social history, and Family History were reviewed and updated.  Review of Systems: As above  Physical Exam:  height is 5\' 4"  (1.626 m) and weight is 178  lb (80.74 kg). His oral temperature is 98 F (36.7 C). His blood pressure is 139/88 and his pulse is 75. His respiration is 18.   Well-developed and well-nourished. Head and neck exam shows no sclera icterus. There is no oral mucositis. He has no adenopathy in the neck. Lungs are clear. Cardiac exam regular in rhythm with no murmurs rubs or bruits. Abdomen is soft. Good bowel sounds. There is no fluid wave. There is no palpable abdominal mass. There is no palpable splenomegaly. His liver edge is palpable at the right costal margin. Extremities shows no clubbing cyanosis or edema. His good range of motion of his joints. Skin exam no rashes. Neurological exam no focal neurological deficits.  Lab Results  Component Value Date   WBC 4.8 09/06/2013   HGB 15.4 09/06/2013   HCT 44.6 09/06/2013   MCV 89 09/06/2013   PLT 169 09/06/2013     Chemistry      Component Value Date/Time   NA 142 09/06/2013 0857   NA 136* 06/22/2013 0800   K 4.4 09/06/2013 0857   K 4.4 06/22/2013 0800   CL 103 09/06/2013 0857   CL 97 06/22/2013 0800   CO2 29 09/06/2013 0857   CO2 25 06/22/2013 0800   BUN 14 09/06/2013 0857   BUN 15 06/22/2013 0800   CREATININE 0.9 09/06/2013 0857   CREATININE 0.68 06/22/2013  0800      Component Value Date/Time   CALCIUM 9.4 09/06/2013 0857   CALCIUM 9.5 06/22/2013 0800   ALKPHOS 97* 09/06/2013 0857   AST 37 09/06/2013 0857   ALT 30 09/06/2013 0857   BILITOT 0.70 09/06/2013 0857         Impression and Plan: Ryan Maynard is 63 year old gentleman with metastatic colon cancer. Again, his response has been very gratifying. His quality of life is much better. He really has come a long way. His CEA has come down to 90%.  We will continue on the FOLFOX. I will plan for 8 cycles and then we will try to get him onto maintenance therapy.  Again I'll see if he is actually taking the Neurontin. If he  is, then we might want to increase that dose.  We will get him back in 2 more weeks.   Volanda Napoleon,  MD 4/6/201510:18 AM

## 2013-09-08 ENCOUNTER — Ambulatory Visit (HOSPITAL_BASED_OUTPATIENT_CLINIC_OR_DEPARTMENT_OTHER): Payer: BC Managed Care – PPO

## 2013-09-08 VITALS — BP 134/84 | HR 83 | Temp 97.3°F | Resp 16

## 2013-09-08 DIAGNOSIS — C787 Secondary malignant neoplasm of liver and intrahepatic bile duct: Secondary | ICD-10-CM

## 2013-09-08 DIAGNOSIS — C187 Malignant neoplasm of sigmoid colon: Secondary | ICD-10-CM

## 2013-09-08 DIAGNOSIS — Z452 Encounter for adjustment and management of vascular access device: Secondary | ICD-10-CM

## 2013-09-08 DIAGNOSIS — C189 Malignant neoplasm of colon, unspecified: Secondary | ICD-10-CM

## 2013-09-08 MED ORDER — HEPARIN SOD (PORK) LOCK FLUSH 100 UNIT/ML IV SOLN
500.0000 [IU] | Freq: Once | INTRAVENOUS | Status: AC | PRN
  Administered 2013-09-08: 500 [IU]
  Filled 2013-09-08: qty 5

## 2013-09-08 MED ORDER — SODIUM CHLORIDE 0.9 % IJ SOLN
10.0000 mL | INTRAMUSCULAR | Status: DC | PRN
  Administered 2013-09-08: 10 mL
  Filled 2013-09-08: qty 10

## 2013-09-20 ENCOUNTER — Ambulatory Visit (HOSPITAL_BASED_OUTPATIENT_CLINIC_OR_DEPARTMENT_OTHER): Payer: BC Managed Care – PPO | Admitting: Hematology & Oncology

## 2013-09-20 ENCOUNTER — Other Ambulatory Visit (HOSPITAL_BASED_OUTPATIENT_CLINIC_OR_DEPARTMENT_OTHER): Payer: BC Managed Care – PPO | Admitting: Lab

## 2013-09-20 ENCOUNTER — Encounter: Payer: Self-pay | Admitting: Hematology & Oncology

## 2013-09-20 ENCOUNTER — Ambulatory Visit (HOSPITAL_BASED_OUTPATIENT_CLINIC_OR_DEPARTMENT_OTHER): Payer: BC Managed Care – PPO

## 2013-09-20 VITALS — BP 145/86 | HR 83 | Temp 98.0°F | Resp 16 | Ht 64.0 in | Wt 182.0 lb

## 2013-09-20 DIAGNOSIS — C187 Malignant neoplasm of sigmoid colon: Secondary | ICD-10-CM

## 2013-09-20 DIAGNOSIS — T451X5A Adverse effect of antineoplastic and immunosuppressive drugs, initial encounter: Secondary | ICD-10-CM

## 2013-09-20 DIAGNOSIS — C787 Secondary malignant neoplasm of liver and intrahepatic bile duct: Principal | ICD-10-CM

## 2013-09-20 DIAGNOSIS — C189 Malignant neoplasm of colon, unspecified: Secondary | ICD-10-CM

## 2013-09-20 DIAGNOSIS — Z5111 Encounter for antineoplastic chemotherapy: Secondary | ICD-10-CM

## 2013-09-20 DIAGNOSIS — G620 Drug-induced polyneuropathy: Secondary | ICD-10-CM

## 2013-09-20 DIAGNOSIS — Z5112 Encounter for antineoplastic immunotherapy: Secondary | ICD-10-CM

## 2013-09-20 LAB — CBC WITH DIFFERENTIAL (CANCER CENTER ONLY)
BASO#: 0.1 10*3/uL (ref 0.0–0.2)
BASO%: 2.3 % — AB (ref 0.0–2.0)
EOS%: 2.5 % (ref 0.0–7.0)
Eosinophils Absolute: 0.1 10*3/uL (ref 0.0–0.5)
HEMATOCRIT: 44.1 % (ref 38.7–49.9)
HGB: 15.4 g/dL (ref 13.0–17.1)
LYMPH#: 1.1 10*3/uL (ref 0.9–3.3)
LYMPH%: 23.9 % (ref 14.0–48.0)
MCH: 31.2 pg (ref 28.0–33.4)
MCHC: 34.9 g/dL (ref 32.0–35.9)
MCV: 90 fL (ref 82–98)
MONO#: 0.7 10*3/uL (ref 0.1–0.9)
MONO%: 16.7 % — AB (ref 0.0–13.0)
NEUT#: 2.4 10*3/uL (ref 1.5–6.5)
NEUT%: 54.6 % (ref 40.0–80.0)
Platelets: 140 10*3/uL — ABNORMAL LOW (ref 145–400)
RBC: 4.93 10*6/uL (ref 4.20–5.70)
RDW: 17.5 % — AB (ref 11.1–15.7)
WBC: 4.4 10*3/uL (ref 4.0–10.0)

## 2013-09-20 LAB — CMP (CANCER CENTER ONLY)
ALK PHOS: 92 U/L — AB (ref 26–84)
ALT(SGPT): 27 U/L (ref 10–47)
AST: 36 U/L (ref 11–38)
Albumin: 3.5 g/dL (ref 3.3–5.5)
BILIRUBIN TOTAL: 0.8 mg/dL (ref 0.20–1.60)
BUN, Bld: 16 mg/dL (ref 7–22)
CO2: 28 mEq/L (ref 18–33)
CREATININE: 0.8 mg/dL (ref 0.6–1.2)
Calcium: 9.4 mg/dL (ref 8.0–10.3)
Chloride: 103 mEq/L (ref 98–108)
GLUCOSE: 111 mg/dL (ref 73–118)
Potassium: 4 mEq/L (ref 3.3–4.7)
Sodium: 139 mEq/L (ref 128–145)
Total Protein: 7.4 g/dL (ref 6.4–8.1)

## 2013-09-20 LAB — CEA: CEA: 158.9 ng/mL — ABNORMAL HIGH (ref 0.0–5.0)

## 2013-09-20 LAB — LACTATE DEHYDROGENASE: LDH: 196 U/L (ref 94–250)

## 2013-09-20 MED ORDER — SODIUM CHLORIDE 0.9 % IV SOLN
2400.0000 mg/m2 | INTRAVENOUS | Status: DC
  Administered 2013-09-20: 4350 mg via INTRAVENOUS
  Filled 2013-09-20: qty 87

## 2013-09-20 MED ORDER — OXALIPLATIN CHEMO INJECTION 100 MG/20ML
85.0000 mg/m2 | Freq: Once | INTRAVENOUS | Status: AC
  Administered 2013-09-20: 155 mg via INTRAVENOUS
  Filled 2013-09-20: qty 31

## 2013-09-20 MED ORDER — ONDANSETRON 8 MG/50ML IVPB (CHCC)
8.0000 mg | Freq: Once | INTRAVENOUS | Status: AC
  Administered 2013-09-20: 8 mg via INTRAVENOUS

## 2013-09-20 MED ORDER — DEXAMETHASONE SODIUM PHOSPHATE 10 MG/ML IJ SOLN
10.0000 mg | Freq: Once | INTRAMUSCULAR | Status: AC
  Administered 2013-09-20: 10 mg via INTRAVENOUS

## 2013-09-20 MED ORDER — FLUOROURACIL CHEMO INJECTION 2.5 GM/50ML
405.0000 mg/m2 | Freq: Once | INTRAVENOUS | Status: AC
  Administered 2013-09-20: 750 mg via INTRAVENOUS
  Filled 2013-09-20: qty 15

## 2013-09-20 MED ORDER — DEXTROSE 5 % IV SOLN
Freq: Once | INTRAVENOUS | Status: AC
  Administered 2013-09-20: 10:00:00 via INTRAVENOUS

## 2013-09-20 MED ORDER — SODIUM CHLORIDE 0.9 % IV SOLN
400.0000 mg | Freq: Once | INTRAVENOUS | Status: AC
  Administered 2013-09-20: 400 mg via INTRAVENOUS
  Filled 2013-09-20: qty 16

## 2013-09-20 MED ORDER — DEXAMETHASONE SODIUM PHOSPHATE 10 MG/ML IJ SOLN
INTRAMUSCULAR | Status: AC
  Filled 2013-09-20: qty 1

## 2013-09-20 MED ORDER — DEXTROSE 5 % IV SOLN
398.0000 mg/m2 | Freq: Once | INTRAVENOUS | Status: AC
  Administered 2013-09-20: 720 mg via INTRAVENOUS
  Filled 2013-09-20: qty 36

## 2013-09-20 NOTE — Progress Notes (Signed)
Hematology and Oncology Follow Up Visit  Ryan Maynard [id number] 1951/01/12 63 y.o. 09/20/2013   Principle Diagnosis:   Metastatic colon cancer  Current Therapy:    Status post 6 cycles of FOLFOX/Avastin     Interim History:  Ryan Maynard is back for followup. He continues to do currently well. Of note, his last CEA was now down to 260. This continues to improve. We first saw him, his CEA was over 4000.  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 He is on some Neurontin. This does seem to help him a little bit.  He's had no nausea vomiting. He's had no change in bowel or bladder habits. He's had no cough.  Overall, his performance status is ECOG 0  Medications: Current outpatient prescriptions:amLODipine-benazepril (LOTREL) 5-20 MG per capsule, Take 1 capsule by mouth daily. , Disp: , Rfl: ;  Coenzyme Q10 10 MG capsule, Take 10 mg by mouth daily., Disp: , Rfl: ;  dexamethasone (DECADRON) 4 MG tablet, Take 8 mg by mouth 2 (two) times daily with a meal. HR DOES NOT TAKE, Disp: , Rfl: ;  gabapentin (NEURONTIN) 100 MG capsule, Take 100 mg by mouth 3 (three) times daily., Disp: , Rfl:  lidocaine-prilocaine (EMLA) cream, Apply 1 application topically as needed. Apply quarter sized amount to portacath site 1-2 hours prior to chemotherapy appt.  Cover with saran wrap., Disp: 30 g, Rfl: 3;  LORazepam (ATIVAN) 0.5 MG tablet, Take 0.5 mg by mouth as needed (Nausea or vomiting)., Disp: , Rfl:  ondansetron (ZOFRAN) 8 MG tablet, Take 8 mg by mouth 2 (two) times daily. Take two times a day starting the day after chemo for 2 days. DOES NOT TAKE, Disp: , Rfl:   Allergies: No Known Allergies  Past Medical History, Surgical history, Social history, and Family History were reviewed and updated.  Review of Systems: As above  Physical Exam:  height is 5\' 4"  (1.626 m) and weight is 182 lb (82.555 kg). His oral temperature is 98 F (36.7 C). His blood pressure is 145/86 and his pulse is 83. His  respiration is 16.   Well-developed and well-nourished gentleman. There is no ocular or oral lesions. There is no adenopathy in the neck. Lungs are clear. Cardiac exam regular rate and rhythm with no murmurs rubs or bruits. Abdomen soft. Has good bowel sounds. His liver edge is palpable only with deep inspiration. Back exam no tenderness over the spine ribs or hips. Extremities shows no clubbing cyanosis or edema. Skin exam no rashes. Neurological exam shows no focal neurological deficits.  Lab Results  Component Value Date   WBC 4.4 09/20/2013   HGB 15.4 09/20/2013   HCT 44.1 09/20/2013   MCV 90 09/20/2013   PLT 140* 09/20/2013     Chemistry      Component Value Date/Time   NA 139 09/20/2013 0816   NA 136* 06/22/2013 0800   K 4.0 09/20/2013 0816   K 4.4 06/22/2013 0800   CL 103 09/20/2013 0816   CL 97 06/22/2013 0800   CO2 28 09/20/2013 0816   CO2 25 06/22/2013 0800   BUN 16 09/20/2013 0816   BUN 15 06/22/2013 0800   CREATININE 0.8 09/20/2013 0816   CREATININE 0.68 06/22/2013 0800      Component Value Date/Time   CALCIUM 9.4 09/20/2013 0816   CALCIUM 9.5 06/22/2013 0800   ALKPHOS 92* 09/20/2013 0816   AST 36 09/20/2013 0816   ALT 27 09/20/2013 0816   BILITOT 0.80 09/20/2013  0816         Impression and Plan: Ryan Maynard is 63 year old gentleman with a metastatic colon cancer. Again, has responded incredibly well. The CEA I think is a highly indicative of this.  We will go ahead and plan for his seventh cycle of treatment.  After his eighth cycle, we'll go ahead and rescan him. We will then decide about maintenance therapy.  I did talk about the possibility of a surgical resection if we do get a very good response.   Volanda Napoleon, MD 4/20/20159:16 AM

## 2013-09-20 NOTE — Patient Instructions (Addendum)
[facility] Discharge Instructions for Patients Receiving Chemotherapy  Today you received the following chemotherapy agents 5FU, Oxaliplatin, and Leucovorin  To help prevent nausea and vomiting after your treatment, we encourage you to take your nausea medication as directed   If you develop nausea and vomiting that is not controlled by your nausea medication, call the clinic.   BELOW ARE SYMPTOMS THAT SHOULD BE REPORTED IMMEDIATELY:  *FEVER GREATER THAN 100.5 F  *CHILLS WITH OR WITHOUT FEVER  NAUSEA AND VOMITING THAT IS NOT CONTROLLED WITH YOUR NAUSEA MEDICATION  *UNUSUAL SHORTNESS OF BREATH  *UNUSUAL BRUISING OR BLEEDING  TENDERNESS IN MOUTH AND THROAT WITH OR WITHOUT PRESENCE OF ULCERS  *URINARY PROBLEMS  *BOWEL PROBLEMS  UNUSUAL RASH Items with * indicate a potential emergency and should be followed up as soon as possible.  Feel free to call the clinic you have any questions or concerns. The clinic phone number is [phone].

## 2013-09-22 ENCOUNTER — Ambulatory Visit (HOSPITAL_BASED_OUTPATIENT_CLINIC_OR_DEPARTMENT_OTHER): Payer: BC Managed Care – PPO

## 2013-09-22 VITALS — BP 125/70 | HR 81 | Temp 97.2°F | Resp 18

## 2013-09-22 DIAGNOSIS — Z452 Encounter for adjustment and management of vascular access device: Secondary | ICD-10-CM

## 2013-09-22 DIAGNOSIS — C187 Malignant neoplasm of sigmoid colon: Secondary | ICD-10-CM

## 2013-09-22 DIAGNOSIS — C189 Malignant neoplasm of colon, unspecified: Secondary | ICD-10-CM

## 2013-09-22 DIAGNOSIS — C787 Secondary malignant neoplasm of liver and intrahepatic bile duct: Principal | ICD-10-CM

## 2013-09-22 MED ORDER — HEPARIN SOD (PORK) LOCK FLUSH 100 UNIT/ML IV SOLN
500.0000 [IU] | Freq: Once | INTRAVENOUS | Status: AC | PRN
  Administered 2013-09-22: 500 [IU]
  Filled 2013-09-22: qty 5

## 2013-09-22 MED ORDER — ALTEPLASE 2 MG IJ SOLR
2.0000 mg | Freq: Once | INTRAMUSCULAR | Status: DC | PRN
  Filled 2013-09-22: qty 2

## 2013-09-22 MED ORDER — SODIUM CHLORIDE 0.9 % IJ SOLN
3.0000 mL | INTRAMUSCULAR | Status: DC | PRN
  Filled 2013-09-22: qty 10

## 2013-09-22 MED ORDER — SODIUM CHLORIDE 0.9 % IJ SOLN
10.0000 mL | INTRAMUSCULAR | Status: DC | PRN
  Administered 2013-09-22: 10 mL
  Filled 2013-09-22: qty 10

## 2013-09-22 MED ORDER — HEPARIN SOD (PORK) LOCK FLUSH 100 UNIT/ML IV SOLN
250.0000 [IU] | Freq: Once | INTRAVENOUS | Status: DC | PRN
  Filled 2013-09-22: qty 5

## 2013-09-22 MED ORDER — COLD PACK MISC ONCOLOGY
1.0000 | Freq: Once | Status: DC | PRN
  Filled 2013-09-22: qty 1

## 2013-09-22 NOTE — Patient Instructions (Signed)

## 2013-10-04 ENCOUNTER — Telehealth: Payer: Self-pay | Admitting: Hematology & Oncology

## 2013-10-04 ENCOUNTER — Encounter: Payer: Self-pay | Admitting: Hematology & Oncology

## 2013-10-04 ENCOUNTER — Other Ambulatory Visit (HOSPITAL_BASED_OUTPATIENT_CLINIC_OR_DEPARTMENT_OTHER): Payer: BC Managed Care – PPO | Admitting: Lab

## 2013-10-04 ENCOUNTER — Ambulatory Visit (HOSPITAL_BASED_OUTPATIENT_CLINIC_OR_DEPARTMENT_OTHER): Payer: BC Managed Care – PPO | Admitting: Hematology & Oncology

## 2013-10-04 ENCOUNTER — Ambulatory Visit (HOSPITAL_BASED_OUTPATIENT_CLINIC_OR_DEPARTMENT_OTHER): Payer: BC Managed Care – PPO

## 2013-10-04 ENCOUNTER — Other Ambulatory Visit: Payer: Self-pay | Admitting: Oncology

## 2013-10-04 VITALS — BP 151/92 | HR 79 | Temp 98.4°F | Resp 16 | Ht 65.0 in | Wt 182.0 lb

## 2013-10-04 DIAGNOSIS — C187 Malignant neoplasm of sigmoid colon: Secondary | ICD-10-CM

## 2013-10-04 DIAGNOSIS — C189 Malignant neoplasm of colon, unspecified: Secondary | ICD-10-CM

## 2013-10-04 DIAGNOSIS — C787 Secondary malignant neoplasm of liver and intrahepatic bile duct: Secondary | ICD-10-CM

## 2013-10-04 DIAGNOSIS — Z5112 Encounter for antineoplastic immunotherapy: Secondary | ICD-10-CM

## 2013-10-04 DIAGNOSIS — Z5111 Encounter for antineoplastic chemotherapy: Secondary | ICD-10-CM

## 2013-10-04 LAB — CBC WITH DIFFERENTIAL (CANCER CENTER ONLY)
BASO#: 0.1 10*3/uL (ref 0.0–0.2)
BASO%: 1.2 % (ref 0.0–2.0)
EOS ABS: 0.1 10*3/uL (ref 0.0–0.5)
EOS%: 0.9 % (ref 0.0–7.0)
HCT: 44 % (ref 38.7–49.9)
HGB: 15.4 g/dL (ref 13.0–17.1)
LYMPH#: 1.2 10*3/uL (ref 0.9–3.3)
LYMPH%: 21.3 % (ref 14.0–48.0)
MCH: 32.2 pg (ref 28.0–33.4)
MCHC: 35 g/dL (ref 32.0–35.9)
MCV: 92 fL (ref 82–98)
MONO#: 1.4 10*3/uL — AB (ref 0.1–0.9)
MONO%: 24.4 % — ABNORMAL HIGH (ref 0.0–13.0)
NEUT#: 3 10*3/uL (ref 1.5–6.5)
NEUT%: 52.2 % (ref 40.0–80.0)
Platelets: 119 10*3/uL — ABNORMAL LOW (ref 145–400)
RBC: 4.78 10*6/uL (ref 4.20–5.70)
RDW: 17.3 % — AB (ref 11.1–15.7)
WBC: 5.8 10*3/uL (ref 4.0–10.0)

## 2013-10-04 LAB — CMP (CANCER CENTER ONLY)
ALBUMIN: 3.4 g/dL (ref 3.3–5.5)
ALT: 22 U/L (ref 10–47)
AST: 28 U/L (ref 11–38)
Alkaline Phosphatase: 97 U/L — ABNORMAL HIGH (ref 26–84)
BUN, Bld: 15 mg/dL (ref 7–22)
CALCIUM: 9.4 mg/dL (ref 8.0–10.3)
CHLORIDE: 103 meq/L (ref 98–108)
CO2: 30 meq/L (ref 18–33)
Creat: 0.9 mg/dl (ref 0.6–1.2)
GLUCOSE: 110 mg/dL (ref 73–118)
Potassium: 4 mEq/L (ref 3.3–4.7)
SODIUM: 142 meq/L (ref 128–145)
TOTAL PROTEIN: 7.6 g/dL (ref 6.4–8.1)
Total Bilirubin: 1.1 mg/dl (ref 0.20–1.60)

## 2013-10-04 LAB — UA PROTEIN, DIPSTICK - CHCC SATELLITE: Protein, Urine: <30 mg/dL

## 2013-10-04 LAB — CEA: CEA: 116.6 ng/mL — AB (ref 0.0–5.0)

## 2013-10-04 MED ORDER — ONDANSETRON 8 MG/50ML IVPB (CHCC)
8.0000 mg | Freq: Once | INTRAVENOUS | Status: AC
  Administered 2013-10-04: 8 mg via INTRAVENOUS

## 2013-10-04 MED ORDER — DEXAMETHASONE SODIUM PHOSPHATE 10 MG/ML IJ SOLN
10.0000 mg | Freq: Once | INTRAMUSCULAR | Status: AC
  Administered 2013-10-04: 10 mg via INTRAVENOUS

## 2013-10-04 MED ORDER — LEUCOVORIN CALCIUM INJECTION 350 MG
400.0000 mg/m2 | Freq: Once | INTRAVENOUS | Status: AC
  Administered 2013-10-04: 724 mg via INTRAVENOUS
  Filled 2013-10-04: qty 36.2

## 2013-10-04 MED ORDER — HEPARIN SOD (PORK) LOCK FLUSH 100 UNIT/ML IV SOLN
500.0000 [IU] | Freq: Once | INTRAVENOUS | Status: DC | PRN
Start: 2013-10-04 — End: 2013-10-04
  Filled 2013-10-04: qty 5

## 2013-10-04 MED ORDER — DEXTROSE 5 % IV SOLN
Freq: Once | INTRAVENOUS | Status: AC
  Administered 2013-10-04: 11:00:00 via INTRAVENOUS

## 2013-10-04 MED ORDER — OXALIPLATIN CHEMO INJECTION 100 MG/20ML
85.0000 mg/m2 | Freq: Once | INTRAVENOUS | Status: AC
  Administered 2013-10-04: 155 mg via INTRAVENOUS
  Filled 2013-10-04: qty 31

## 2013-10-04 MED ORDER — SODIUM CHLORIDE 0.9 % IJ SOLN
10.0000 mL | INTRAMUSCULAR | Status: DC | PRN
  Filled 2013-10-04: qty 10

## 2013-10-04 MED ORDER — SODIUM CHLORIDE 0.9 % IV SOLN
2400.0000 mg/m2 | INTRAVENOUS | Status: DC
  Administered 2013-10-04: 4350 mg via INTRAVENOUS
  Filled 2013-10-04: qty 87

## 2013-10-04 MED ORDER — FLUOROURACIL CHEMO INJECTION 2.5 GM/50ML
750.0000 mg | Freq: Once | INTRAVENOUS | Status: AC
Start: 2013-10-04 — End: 2013-10-04
  Administered 2013-10-04: 750 mg via INTRAVENOUS
  Filled 2013-10-04: qty 15

## 2013-10-04 MED ORDER — DEXAMETHASONE SODIUM PHOSPHATE 10 MG/ML IJ SOLN
INTRAMUSCULAR | Status: AC
  Filled 2013-10-04: qty 1

## 2013-10-04 MED ORDER — BEVACIZUMAB CHEMO INJECTION 400 MG/16ML
400.0000 mg | Freq: Once | INTRAVENOUS | Status: AC
  Administered 2013-10-04: 400 mg via INTRAVENOUS
  Filled 2013-10-04: qty 16

## 2013-10-04 MED ORDER — SODIUM CHLORIDE 0.9 % IV SOLN
Freq: Once | INTRAVENOUS | Status: AC
  Administered 2013-10-04: 11:00:00 via INTRAVENOUS

## 2013-10-04 NOTE — Patient Instructions (Signed)
Bevacizumab injection What is this medicine? BEVACIZUMAB (be va SIZ yoo mab) is a chemotherapy drug. It targets a protein found in many cancer cell types, and halts cancer growth. This drug treats many cancers including non-small cell lung cancer, and colon or rectal cancer. It is usually given with other chemotherapy drugs. This medicine may be used for other purposes; ask your health care provider or pharmacist if you have questions. COMMON BRAND NAME(S): Avastin What should I tell my health care provider before I take this medicine? They need to know if you have any of these conditions: -blood clots -heart disease, including heart failure, heart attack, or chest pain (angina) -high blood pressure -infection (especially a virus infection such as chickenpox, cold sores, or herpes) -kidney disease -lung disease -prior chemotherapy with doxorubicin, daunorubicin, epirubicin, or other anthracycline type chemotherapy agents -recent or ongoing radiation therapy -recent surgery -stroke -an unusual or allergic reaction to bevacizumab, hamster proteins, mouse proteins, other medicines, foods, dyes, or preservatives -pregnant or trying to get pregnant -breast-feeding How should I use this medicine? This medicine is for infusion into a vein. It is given by a health care professional in a hospital or clinic setting. Talk to your pediatrician regarding the use of this medicine in children. Special care may be needed. Overdosage: If you think you have taken too much of this medicine contact a poison control center or emergency room at once. NOTE: This medicine is only for you. Do not share this medicine with others. What if I miss a dose? It is important not to miss your dose. Call your doctor or health care professional if you are unable to keep an appointment. What may interact with this medicine? Interactions are not expected. This list may not describe all possible interactions. Give your health  care provider a list of all the medicines, herbs, non-prescription drugs, or dietary supplements you use. Also tell them if you smoke, drink alcohol, or use illegal drugs. Some items may interact with your medicine. What should I watch for while using this medicine? Your condition will be monitored carefully while you are receiving this medicine. You will need important blood work and urine testing done while you are taking this medicine. During your treatment, let your health care professional know if you have any unusual symptoms, such as difficulty breathing. This medicine may rarely cause 'gastrointestinal perforation' (holes in the stomach, intestines or colon), a serious side effect requiring surgery to repair. This medicine should be started at least 28 days following major surgery and the site of the surgery should be totally healed. Check with your doctor before scheduling dental work or surgery while you are receiving this treatment. Talk to your doctor if you have recently had surgery or if you have a wound that has not healed. Do not become pregnant while taking this medicine. Women should inform their doctor if they wish to become pregnant or think they might be pregnant. There is a potential for serious side effects to an unborn child. Talk to your health care professional or pharmacist for more information. Do not breast-feed an infant while taking this medicine. This medicine has caused ovarian failure in some women. This medicine may interfere with the ability to have a child. You should talk to your doctor or health care professional if you are concerned about your fertility. What side effects may I notice from receiving this medicine? Side effects that you should report to your doctor or health care professional as soon as possible: -  allergic reactions like skin rash, itching or hives, swelling of the face, lips, or tongue -signs of infection - fever or chills, cough, sore throat, pain  or trouble passing urine -signs of decreased platelets or bleeding - bruising, pinpoint red spots on the skin, black, tarry stools, nosebleeds, blood in the urine -breathing problems -changes in vision -chest pain -confusion -jaw pain, especially after dental work -mouth sores -seizures -severe abdominal pain -severe headache -sudden numbness or weakness of the face, arm or leg -swelling of legs or ankles -symptoms of a stroke: change in mental awareness, inability to talk or move one side of the body (especially in patients with lung cancer) -trouble passing urine or change in the amount of urine -trouble speaking or understanding -trouble walking, dizziness, loss of balance or coordination Side effects that usually do not require medical attention (report to your doctor or health care professional if they continue or are bothersome): -constipation -diarrhea -dry skin -headache -loss of appetite -nausea, vomiting This list may not describe all possible side effects. Call your doctor for medical advice about side effects. You may report side effects to FDA at 1-800-FDA-1088. Where should I keep my medicine? This drug is given in a hospital or clinic and will not be stored at home. NOTE: This sheet is a summary. It may not cover all possible information. If you have questions about this medicine, talk to your doctor, pharmacist, or health care provider.  2014, [employer]. (2010-04-20 16:25:37) Fluorouracil, 5-FU injection What is this medicine? FLUOROURACIL, 5-FU (flure oh YOOR a sil) is a chemotherapy drug. It slows the growth of cancer cells. This medicine is used to treat many types of cancer like breast cancer, colon or rectal cancer, pancreatic cancer, and stomach cancer. This medicine may be used for other purposes; ask your health care provider or pharmacist if you have questions. COMMON BRAND NAME(S): Adrucil What should I tell my health care provider before I  take this medicine? They need to know if you have any of these conditions: -blood disorders -dihydropyrimidine dehydrogenase (DPD) deficiency -infection (especially a virus infection such as chickenpox, cold sores, or herpes) -kidney disease -liver disease -malnourished, poor nutrition -recent or ongoing radiation therapy -an unusual or allergic reaction to fluorouracil, other chemotherapy, other medicines, foods, dyes, or preservatives -pregnant or trying to get pregnant -breast-feeding How should I use this medicine? This drug is given as an infusion or injection into a vein. It is administered in a hospital or clinic by a specially trained health care professional. Talk to your pediatrician regarding the use of this medicine in children. Special care may be needed. Overdosage: If you think you have taken too much of this medicine contact a poison control center or emergency room at once. NOTE: This medicine is only for you. Do not share this medicine with others. What if I miss a dose? It is important not to miss your dose. Call your doctor or health care professional if you are unable to keep an appointment. What may interact with this medicine? -allopurinol -cimetidine -dapsone -digoxin -hydroxyurea -leucovorin -levamisole -medicines for seizures like ethotoin, fosphenytoin, phenytoin -medicines to increase blood counts like filgrastim, pegfilgrastim, sargramostim -medicines that treat or prevent blood clots like warfarin, enoxaparin, and dalteparin -methotrexate -metronidazole -pyrimethamine -some other chemotherapy drugs like busulfan, cisplatin, estramustine, vinblastine -trimethoprim -trimetrexate -vaccines Talk to your doctor or health care professional before taking any of these medicines: -acetaminophen -aspirin -ibuprofen -ketoprofen -naproxen This list may not describe all possible interactions. Give your  health care provider a list of all the medicines,  herbs, non-prescription drugs, or dietary supplements you use. Also tell them if you smoke, drink alcohol, or use illegal drugs. Some items may interact with your medicine. What should I watch for while using this medicine? Visit your doctor for checks on your progress. This drug may make you feel generally unwell. This is not uncommon, as chemotherapy can affect healthy cells as well as cancer cells. Report any side effects. Continue your course of treatment even though you feel ill unless your doctor tells you to stop. In some cases, you may be given additional medicines to help with side effects. Follow all directions for their use. Call your doctor or health care professional for advice if you get a fever, chills or sore throat, or other symptoms of a cold or flu. Do not treat yourself. This drug decreases your body's ability to fight infections. Try to avoid being around people who are sick. This medicine may increase your risk to bruise or bleed. Call your doctor or health care professional if you notice any unusual bleeding. Be careful brushing and flossing your teeth or using a toothpick because you may get an infection or bleed more easily. If you have any dental work done, tell your dentist you are receiving this medicine. Avoid taking products that contain aspirin, acetaminophen, ibuprofen, naproxen, or ketoprofen unless instructed by your doctor. These medicines may hide a fever. Do not become pregnant while taking this medicine. Women should inform their doctor if they wish to become pregnant or think they might be pregnant. There is a potential for serious side effects to an unborn child. Talk to your health care professional or pharmacist for more information. Do not breast-feed an infant while taking this medicine. Men should inform their doctor if they wish to father a child. This medicine may lower sperm counts. Do not treat diarrhea with over the counter products. Contact your doctor if  you have diarrhea that lasts more than 2 days or if it is severe and watery. This medicine can make you more sensitive to the sun. Keep out of the sun. If you cannot avoid being in the sun, wear protective clothing and use sunscreen. Do not use sun lamps or tanning beds/booths. What side effects may I notice from receiving this medicine? Side effects that you should report to your doctor or health care professional as soon as possible: -allergic reactions like skin rash, itching or hives, swelling of the face, lips, or tongue -low blood counts - this medicine may decrease the number of white blood cells, red blood cells and platelets. You may be at increased risk for infections and bleeding. -signs of infection - fever or chills, cough, sore throat, pain or difficulty passing urine -signs of decreased platelets or bleeding - bruising, pinpoint red spots on the skin, black, tarry stools, blood in the urine -signs of decreased red blood cells - unusually weak or tired, fainting spells, lightheadedness -breathing problems -changes in vision -chest pain -mouth sores -nausea and vomiting -pain, swelling, redness at site where injected -pain, tingling, numbness in the hands or feet -redness, swelling, or sores on hands or feet -stomach pain -unusual bleeding Side effects that usually do not require medical attention (report to your doctor or health care professional if they continue or are bothersome): -changes in finger or toe nails -diarrhea -dry or itchy skin -hair loss -headache -loss of appetite -sensitivity of eyes to the light -stomach upset -unusually teary eyes  This list may not describe all possible side effects. Call your doctor for medical advice about side effects. You may report side effects to FDA at 1-800-FDA-1088. Where should I keep my medicine? This drug is given in a hospital or clinic and will not be stored at home. NOTE: This sheet is a summary. It may not cover all  possible information. If you have questions about this medicine, talk to your doctor, pharmacist, or health care provider.  2014, [employer]. (2007-09-23 13:53:16) Oxaliplatin Injection What is this medicine? OXALIPLATIN (ox AL i PLA tin) is a chemotherapy drug. It targets fast dividing cells, like cancer cells, and causes these cells to die. This medicine is used to treat cancers of the colon and rectum, and many other cancers. This medicine may be used for other purposes; ask your health care provider or pharmacist if you have questions. COMMON BRAND NAME(S): Eloxatin What should I tell my health care provider before I take this medicine? They need to know if you have any of these conditions: -kidney disease -an unusual or allergic reaction to oxaliplatin, other chemotherapy, other medicines, foods, dyes, or preservatives -pregnant or trying to get pregnant -breast-feeding How should I use this medicine? This drug is given as an infusion into a vein. It is administered in a hospital or clinic by a specially trained health care professional. Talk to your pediatrician regarding the use of this medicine in children. Special care may be needed. Overdosage: If you think you have taken too much of this medicine contact a poison control center or emergency room at once. NOTE: This medicine is only for you. Do not share this medicine with others. What if I miss a dose? It is important not to miss a dose. Call your doctor or health care professional if you are unable to keep an appointment. What may interact with this medicine? -medicines to increase blood counts like filgrastim, pegfilgrastim, sargramostim -probenecid -some antibiotics like amikacin, gentamicin, neomycin, polymyxin B, streptomycin, tobramycin -zalcitabine Talk to your doctor or health care professional before taking any of these medicines: -acetaminophen -aspirin -ibuprofen -ketoprofen -naproxen This list may  not describe all possible interactions. Give your health care provider a list of all the medicines, herbs, non-prescription drugs, or dietary supplements you use. Also tell them if you smoke, drink alcohol, or use illegal drugs. Some items may interact with your medicine. What should I watch for while using this medicine? Your condition will be monitored carefully while you are receiving this medicine. You will need important blood work done while you are taking this medicine. This medicine can make you more sensitive to cold. Do not drink cold drinks or use ice. Cover exposed skin before coming in contact with cold temperatures or cold objects. When out in cold weather wear warm clothing and cover your mouth and nose to warm the air that goes into your lungs. Tell your doctor if you get sensitive to the cold. This drug may make you feel generally unwell. This is not uncommon, as chemotherapy can affect healthy cells as well as cancer cells. Report any side effects. Continue your course of treatment even though you feel ill unless your doctor tells you to stop. In some cases, you may be given additional medicines to help with side effects. Follow all directions for their use. Call your doctor or health care professional for advice if you get a fever, chills or sore throat, or other symptoms of a cold or flu. Do not treat yourself. This  drug decreases your body's ability to fight infections. Try to avoid being around people who are sick. This medicine may increase your risk to bruise or bleed. Call your doctor or health care professional if you notice any unusual bleeding. Be careful brushing and flossing your teeth or using a toothpick because you may get an infection or bleed more easily. If you have any dental work done, tell your dentist you are receiving this medicine. Avoid taking products that contain aspirin, acetaminophen, ibuprofen, naproxen, or ketoprofen unless instructed by your doctor. These  medicines may hide a fever. Do not become pregnant while taking this medicine. Women should inform their doctor if they wish to become pregnant or think they might be pregnant. There is a potential for serious side effects to an unborn child. Talk to your health care professional or pharmacist for more information. Do not breast-feed an infant while taking this medicine. Call your doctor or health care professional if you get diarrhea. Do not treat yourself. What side effects may I notice from receiving this medicine? Side effects that you should report to your doctor or health care professional as soon as possible: -allergic reactions like skin rash, itching or hives, swelling of the face, lips, or tongue -low blood counts - This drug may decrease the number of white blood cells, red blood cells and platelets. You may be at increased risk for infections and bleeding. -signs of infection - fever or chills, cough, sore throat, pain or difficulty passing urine -signs of decreased platelets or bleeding - bruising, pinpoint red spots on the skin, black, tarry stools, nosebleeds -signs of decreased red blood cells - unusually weak or tired, fainting spells, lightheadedness -breathing problems -chest pain, pressure -cough -diarrhea -jaw tightness -mouth sores -nausea and vomiting -pain, swelling, redness or irritation at the injection site -pain, tingling, numbness in the hands or feet -problems with balance, talking, walking -redness, blistering, peeling or loosening of the skin, including inside the mouth -trouble passing urine or change in the amount of urine Side effects that usually do not require medical attention (report to your doctor or health care professional if they continue or are bothersome): -changes in vision -constipation -hair loss -loss of appetite -metallic taste in the mouth or changes in taste -stomach pain This list may not describe all possible side effects. Call your  doctor for medical advice about side effects. You may report side effects to FDA at 1-800-FDA-1088. Where should I keep my medicine? This drug is given in a hospital or clinic and will not be stored at home. NOTE: This sheet is a summary. It may not cover all possible information. If you have questions about this medicine, talk to your doctor, pharmacist, or health care provider.  2014, [employer]. (2007-12-15 17:22:47) Leucovorin injection What is this medicine? LEUCOVORIN (loo koe VOR in) is used to prevent or treat the harmful effects of some medicines. This medicine is used to treat anemia caused by a low amount of folic acid in the body. It is also used with 5-fluorouracil (5-FU) to treat colon cancer. This medicine may be used for other purposes; ask your health care provider or pharmacist if you have questions. What should I tell my health care provider before I take this medicine? They need to know if you have any of these conditions: -anemia from low levels of vitamin B-12 in the blood -an unusual or allergic reaction to leucovorin, folic acid, other medicines, foods, dyes, or preservatives -pregnant or trying to get  pregnant -breast-feeding How should I use this medicine? This medicine is for injection into a muscle or into a vein. It is given by a health care professional in a hospital or clinic setting. Talk to your pediatrician regarding the use of this medicine in children. Special care may be needed. Overdosage: If you think you have taken too much of this medicine contact a poison control center or emergency room at once. NOTE: This medicine is only for you. Do not share this medicine with others. What if I miss a dose? This does not apply. What may interact with this medicine? -capecitabine -fluorouracil -phenobarbital -phenytoin -primidone -trimethoprim-sulfamethoxazole This list may not describe all possible interactions. Give your health care provider a  list of all the medicines, herbs, non-prescription drugs, or dietary supplements you use. Also tell them if you smoke, drink alcohol, or use illegal drugs. Some items may interact with your medicine. What should I watch for while using this medicine? Your condition will be monitored carefully while you are receiving this medicine. This medicine may increase the side effects of 5-fluorouracil, 5-FU. Tell your doctor or health care professional if you have diarrhea or mouth sores that do not get better or that get worse. What side effects may I notice from receiving this medicine? Side effects that you should report to your doctor or health care professional as soon as possible: -allergic reactions like skin rash, itching or hives, swelling of the face, lips, or tongue -breathing problems -fever, infection -mouth sores -unusual bleeding or bruising -unusually weak or tired Side effects that usually do not require medical attention (report to your doctor or health care professional if they continue or are bothersome): -constipation or diarrhea -loss of appetite -nausea, vomiting This list may not describe all possible side effects. Call your doctor for medical advice about side effects. You may report side effects to FDA at 1-800-FDA-1088. Where should I keep my medicine? This drug is given in a hospital or clinic and will not be stored at home. NOTE: This sheet is a summary. It may not cover all possible information. If you have questions about this medicine, talk to your doctor, pharmacist, or health care provider.  2014, [employer]. (2007-11-24 16:50:29)

## 2013-10-04 NOTE — Telephone Encounter (Signed)
Made 5-18 CT at [facility]. Pt aware to be NPO at 530 am and to be there at 8 am to drink contrast for 930 am scan

## 2013-10-04 NOTE — Progress Notes (Signed)
Hematology and Oncology Follow Up Visit  Ryan Maynard [id number] March 02, 1951 63 y.o. 10/04/2013   Principle Diagnosis:   Metastatic colon cancer  Current Therapy:    s/p 7 cycles of FOLFOX/Avastin     Interim History:  [name] is back for followup. He continues to do well. 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 It does seem to bother him the first week of treatment. He is on Neurontin.  His CEA has come down very nicely. After his sixth cycle of treatment, the CEA was done 158.  He really isn't bothered by the 5-FU pump. He would like to get rid of the pump.. Now that it I think we can probably get him onto Xeloda.  He is due for another set of scans. This will be his eighth cycle of treatment. Prevacid eating well. He's had no problems with weight loss. He's had no diarrhea. He's had no mouth sores. Is no cough. He's had no rashes. is becoming warmer, he wants to be more active.  Medications: Current outpatient prescriptions:amLODipine-benazepril (LOTREL) 5-20 MG per capsule, Take 1 capsule by mouth daily. , Disp: , Rfl: ;  Coenzyme Q10 10 MG capsule, Take 10 mg by mouth daily., Disp: , Rfl: ;  dexamethasone (DECADRON) 4 MG tablet, Take 8 mg by mouth 2 (two) times daily with a meal. HR DOES NOT TAKE, Disp: , Rfl: ;  gabapentin (NEURONTIN) 100 MG capsule, Take 100 mg by mouth 3 (three) times daily., Disp: , Rfl:  lidocaine-prilocaine (EMLA) cream, Apply 1 application topically as needed. Apply quarter sized amount to portacath site 1-2 hours prior to chemotherapy appt.  Cover with saran wrap., Disp: 30 g, Rfl: 3;  LORazepam (ATIVAN) 0.5 MG tablet, Take 0.5 mg by mouth as needed (Nausea or vomiting)., Disp: , Rfl:  ondansetron (ZOFRAN) 8 MG tablet, Take 8 mg by mouth 2 (two) times daily. Take two times a day starting the day after chemo for 2 days. DOES NOT TAKE, Disp: , Rfl:  No current facility-administered medications for this visit. Facility-Administered  Medications Ordered in Other Visits: bevacizumab (AVASTIN) 350 mg in sodium chloride 0.9 % 100 mL chemo infusion, 5 mg/kg (Treatment Plan Actual), Intravenous, Once, Volanda Napoleon, MD;  fluorouracil (ADRUCIL) 4,350 mg in sodium chloride 0.9 % 150 mL chemo infusion, 2,400 mg/m2 (Treatment Plan Actual), Intravenous, 1 day or 1 dose, Volanda Napoleon, MD fluorouracil (ADRUCIL) chemo injection 750 mg, 750 mg, Intravenous, Once, Volanda Napoleon, MD;  heparin lock flush 100 unit/mL, 500 Units, Intracatheter, Once PRN, Volanda Napoleon, MD;  leucovorin 724 mg in dextrose 5 % 250 mL infusion, 400 mg/m2 (Treatment Plan Actual), Intravenous, Once, Volanda Napoleon, MD;  ondansetron (ZOFRAN) IVPB 8 mg, 8 mg, Intravenous, Once, Volanda Napoleon, MD, 8 mg at 10/04/13 1042 oxaliplatin (ELOXATIN) 155 mg in dextrose 5 % 500 mL chemo infusion, 85 mg/m2 (Treatment Plan Actual), Intravenous, Once, Volanda Napoleon, MD;  sodium chloride 0.9 % injection 10 mL, 10 mL, Intracatheter, PRN, Volanda Napoleon, MD  Allergies: No Known Allergies  Past Medical History, Surgical history, Social history, and Family History were reviewed and updated.  Review of Systems: As above  Physical Exam:  height is 5\' 5"  (1.651 m) and weight is 182 lb (82.555 kg). His oral temperature is 98.4 F (36.9 C). His blood pressure is 151/92 and his pulse is 79. His respiration is 16.   Well-developed and well-nourished Caucasian gentleman. His head and  exam shows no ocular or oral lesions. He has no palpable cervical or supraclavicular lymph nodes. Lungs are clear. Cardiac exam regular in rhythm. Abdomen soft. Has good bowel sounds. There is no palpable abdominal mass. There is no fluid wave. He is no palpable liver or spleen tip. Extremities shows no clubbing cyanosis or edema. Skin exam no rashes. Neurological exam no focal deficits. Back exam no tenderness over the spine ribs or hips.  Lab Results  Component Value Date   WBC 5.8 10/04/2013   HGB  15.4 10/04/2013   HCT 44.0 10/04/2013   MCV 92 10/04/2013   PLT 119* 10/04/2013     Chemistry      Component Value Date/Time   NA 142 10/04/2013 0850   NA 136* 06/22/2013 0800   K 4.0 10/04/2013 0850   K 4.4 06/22/2013 0800   CL 103 10/04/2013 0850   CL 97 06/22/2013 0800   CO2 30 10/04/2013 0850   CO2 25 06/22/2013 0800   BUN 15 10/04/2013 0850   BUN 15 06/22/2013 0800   CREATININE 0.9 10/04/2013 0850   CREATININE 0.68 06/22/2013 0800      Component Value Date/Time   CALCIUM 9.4 10/04/2013 0850   CALCIUM 9.5 06/22/2013 0800   ALKPHOS 97* 10/04/2013 0850   AST 28 10/04/2013 0850   ALT 22 10/04/2013 0850   BILITOT 1.10 10/04/2013 0850         Impression and Plan: Ryan Maynard is 63 year old with metastatic colon cancer. He had an incredible amount of disease when we first saw him. He is responded incredibly well. His CEA has come down from over 4000 down to 158.  We will go ahead and plan for his eighth cycle of chemotherapy. We will then plan for a followup CT scan in about 2 weeks.  If we see a continue to response, then I'll put him on CAPOX which should be very reasonable.  I think as long as we are seeing a good response, I think we have to continue the oxaliplatin. I still think that if we could get him to surgery for resection of his metastatic disease, this would clearly be advantageous.  I spent a good hour talking with him  about the future plans that we have. He understands all of this. He wants to be proactive which he certainly has been.   Volanda Napoleon, MD 5/4/201510:43 AM

## 2013-10-04 NOTE — Progress Notes (Signed)
Urine protein-Less than 30.

## 2013-10-05 ENCOUNTER — Telehealth: Payer: Self-pay | Admitting: *Deleted

## 2013-10-05 NOTE — Telephone Encounter (Addendum)
Message copied by Lenn Sink on Tue Oct 05, 2013 11:58 AM ------      Message from: Burney Gauze R      Created: Mon Oct 04, 2013  6:46 PM       Please call him and let him know that his CEA is now down to 115. It keeps coming down nicely. Thanks.Marland Kitchen Pete ------Left voicemail informing pt that CEA is down to 115.

## 2013-10-06 ENCOUNTER — Ambulatory Visit (HOSPITAL_BASED_OUTPATIENT_CLINIC_OR_DEPARTMENT_OTHER): Payer: BC Managed Care – PPO

## 2013-10-06 VITALS — BP 141/78 | HR 81 | Temp 98.3°F | Resp 16

## 2013-10-06 DIAGNOSIS — C787 Secondary malignant neoplasm of liver and intrahepatic bile duct: Secondary | ICD-10-CM

## 2013-10-06 DIAGNOSIS — C189 Malignant neoplasm of colon, unspecified: Secondary | ICD-10-CM

## 2013-10-06 DIAGNOSIS — C187 Malignant neoplasm of sigmoid colon: Secondary | ICD-10-CM

## 2013-10-06 MED ORDER — SODIUM CHLORIDE 0.9 % IJ SOLN
10.0000 mL | INTRAMUSCULAR | Status: DC | PRN
  Administered 2013-10-06: 10 mL
  Filled 2013-10-06: qty 10

## 2013-10-06 MED ORDER — HEPARIN SOD (PORK) LOCK FLUSH 100 UNIT/ML IV SOLN
500.0000 [IU] | Freq: Once | INTRAVENOUS | Status: AC | PRN
  Administered 2013-10-06: 500 [IU]
  Filled 2013-10-06: qty 5

## 2013-10-18 ENCOUNTER — Other Ambulatory Visit: Payer: BC Managed Care – PPO

## 2013-10-27 ENCOUNTER — Ambulatory Visit (HOSPITAL_BASED_OUTPATIENT_CLINIC_OR_DEPARTMENT_OTHER): Payer: BC Managed Care – PPO | Admitting: Hematology & Oncology

## 2013-10-27 ENCOUNTER — Ambulatory Visit (HOSPITAL_BASED_OUTPATIENT_CLINIC_OR_DEPARTMENT_OTHER): Payer: BC Managed Care – PPO

## 2013-10-27 ENCOUNTER — Encounter: Payer: Self-pay | Admitting: Hematology & Oncology

## 2013-10-27 ENCOUNTER — Other Ambulatory Visit (HOSPITAL_BASED_OUTPATIENT_CLINIC_OR_DEPARTMENT_OTHER): Payer: BC Managed Care – PPO | Admitting: Lab

## 2013-10-27 VITALS — BP 143/85 | HR 77 | Temp 98.2°F | Resp 16 | Ht 65.0 in | Wt 189.0 lb

## 2013-10-27 DIAGNOSIS — C187 Malignant neoplasm of sigmoid colon: Secondary | ICD-10-CM

## 2013-10-27 DIAGNOSIS — C189 Malignant neoplasm of colon, unspecified: Secondary | ICD-10-CM

## 2013-10-27 DIAGNOSIS — C787 Secondary malignant neoplasm of liver and intrahepatic bile duct: Secondary | ICD-10-CM

## 2013-10-27 DIAGNOSIS — Z5112 Encounter for antineoplastic immunotherapy: Secondary | ICD-10-CM

## 2013-10-27 DIAGNOSIS — Z5111 Encounter for antineoplastic chemotherapy: Secondary | ICD-10-CM

## 2013-10-27 LAB — CBC WITH DIFFERENTIAL (CANCER CENTER ONLY)
BASO#: 0.1 10*3/uL (ref 0.0–0.2)
BASO%: 3.3 % — ABNORMAL HIGH (ref 0.0–2.0)
EOS ABS: 0.1 10*3/uL (ref 0.0–0.5)
EOS%: 2.5 % (ref 0.0–7.0)
HCT: 43.9 % (ref 38.7–49.9)
HEMOGLOBIN: 15.2 g/dL (ref 13.0–17.1)
LYMPH#: 1 10*3/uL (ref 0.9–3.3)
LYMPH%: 26.4 % (ref 14.0–48.0)
MCH: 32.6 pg (ref 28.0–33.4)
MCHC: 34.6 g/dL (ref 32.0–35.9)
MCV: 94 fL (ref 82–98)
MONO#: 0.8 10*3/uL (ref 0.1–0.9)
MONO%: 22 % — AB (ref 0.0–13.0)
NEUT#: 1.7 10*3/uL (ref 1.5–6.5)
NEUT%: 45.8 % (ref 40.0–80.0)
Platelets: 149 10*3/uL (ref 145–400)
RBC: 4.66 10*6/uL (ref 4.20–5.70)
RDW: 15.5 % (ref 11.1–15.7)
WBC: 3.6 10*3/uL — ABNORMAL LOW (ref 4.0–10.0)

## 2013-10-27 LAB — CMP (CANCER CENTER ONLY)
ALT(SGPT): 41 U/L (ref 10–47)
AST: 50 U/L — ABNORMAL HIGH (ref 11–38)
Albumin: 3.3 g/dL (ref 3.3–5.5)
Alkaline Phosphatase: 102 U/L — ABNORMAL HIGH (ref 26–84)
BUN: 13 mg/dL (ref 7–22)
CHLORIDE: 101 meq/L (ref 98–108)
CO2: 28 mEq/L (ref 18–33)
CREATININE: 0.8 mg/dL (ref 0.6–1.2)
Calcium: 9 mg/dL (ref 8.0–10.3)
GLUCOSE: 98 mg/dL (ref 73–118)
Potassium: 4.1 mEq/L (ref 3.3–4.7)
Sodium: 140 mEq/L (ref 128–145)
Total Bilirubin: 0.7 mg/dl (ref 0.20–1.60)
Total Protein: 7.1 g/dL (ref 6.4–8.1)

## 2013-10-27 LAB — LACTATE DEHYDROGENASE: LDH: 210 U/L (ref 94–250)

## 2013-10-27 LAB — CEA: CEA: 71 ng/mL — ABNORMAL HIGH (ref 0.0–5.0)

## 2013-10-27 MED ORDER — SODIUM CHLORIDE 0.9 % IJ SOLN
10.0000 mL | INTRAMUSCULAR | Status: DC | PRN
  Filled 2013-10-27: qty 10

## 2013-10-27 MED ORDER — LEUCOVORIN CALCIUM INJECTION 350 MG
400.0000 mg/m2 | Freq: Once | INTRAVENOUS | Status: AC
  Administered 2013-10-27: 724 mg via INTRAVENOUS
  Filled 2013-10-27: qty 36.2

## 2013-10-27 MED ORDER — DEXAMETHASONE SODIUM PHOSPHATE 10 MG/ML IJ SOLN
10.0000 mg | Freq: Once | INTRAMUSCULAR | Status: AC
  Administered 2013-10-27: 10 mg via INTRAVENOUS

## 2013-10-27 MED ORDER — ONDANSETRON 8 MG/50ML IVPB (CHCC)
8.0000 mg | Freq: Once | INTRAVENOUS | Status: AC
  Administered 2013-10-27: 8 mg via INTRAVENOUS

## 2013-10-27 MED ORDER — DEXTROSE 5 % IV SOLN
Freq: Once | INTRAVENOUS | Status: AC
  Administered 2013-10-27: 11:00:00 via INTRAVENOUS

## 2013-10-27 MED ORDER — SODIUM CHLORIDE 0.9 % IV SOLN
400.0000 mg | Freq: Once | INTRAVENOUS | Status: AC
  Administered 2013-10-27: 400 mg via INTRAVENOUS
  Filled 2013-10-27: qty 16

## 2013-10-27 MED ORDER — FLUOROURACIL CHEMO INJECTION 2.5 GM/50ML
750.0000 mg | Freq: Once | INTRAVENOUS | Status: AC
  Administered 2013-10-27: 750 mg via INTRAVENOUS
  Filled 2013-10-27: qty 15

## 2013-10-27 MED ORDER — SODIUM CHLORIDE 0.9 % IV SOLN
Freq: Once | INTRAVENOUS | Status: AC
  Administered 2013-10-27: 11:00:00 via INTRAVENOUS

## 2013-10-27 MED ORDER — DEXAMETHASONE SODIUM PHOSPHATE 10 MG/ML IJ SOLN
INTRAMUSCULAR | Status: AC
  Filled 2013-10-27: qty 1

## 2013-10-27 MED ORDER — SODIUM CHLORIDE 0.9 % IV SOLN
2400.0000 mg/m2 | INTRAVENOUS | Status: DC
  Administered 2013-10-27: 4350 mg via INTRAVENOUS
  Filled 2013-10-27: qty 87

## 2013-10-27 MED ORDER — HEPARIN SOD (PORK) LOCK FLUSH 100 UNIT/ML IV SOLN
500.0000 [IU] | Freq: Once | INTRAVENOUS | Status: DC | PRN
  Filled 2013-10-27: qty 5

## 2013-10-27 MED ORDER — OXALIPLATIN CHEMO INJECTION 100 MG/20ML
85.0000 mg/m2 | Freq: Once | INTRAVENOUS | Status: AC
  Administered 2013-10-27: 155 mg via INTRAVENOUS
  Filled 2013-10-27: qty 31

## 2013-10-27 NOTE — Patient Instructions (Signed)
[facility] Discharge Instructions for Patients Receiving Chemotherapy  Today you received the following chemotherapy agents Oxaliplatin, 5FU, Leucovorin To help prevent nausea and vomiting after your treatment, we encourage you to take your nausea medication   1) Zofran  (Ondansetron) 8 mg by mouth one in am and one in pm beginning 3 days after chemotherapy.   Take this for 3 days.    2)Compazine  (Prochlorperazine)10 mg by mouth every 6 hours as needed for nausea or vomiting,  3) Decadron  (Dexamethasone 4 mg)  Take 2 tablets by mouth 2 times daily with a meal.  Begin taking it the day after chemotherapy for 3 days.    4) Ativan (Lorazepam)- Take .5 mg tablet by mouth or under tongue every 6 hours for nausea, or for anxiety.     If you develop nausea and vomiting that is not controlled by your nausea medication, call the clinic [phone] If it is after clinic hours your family physician or the after hours number for the clinic or go to the Emergency Department.   BELOW ARE SYMPTOMS THAT SHOULD BE REPORTED IMMEDIATELY:  *FEVER GREATER THAN 100.5 F  *CHILLS WITH OR WITHOUT FEVER  NAUSEA AND VOMITING THAT IS NOT CONTROLLED WITH YOUR NAUSEA MEDICATION  *UNUSUAL SHORTNESS OF BREATH  *UNUSUAL BRUISING OR BLEEDING  TENDERNESS IN MOUTH AND THROAT WITH OR WITHOUT PRESENCE OF ULCERS  *URINARY PROBLEMS  *BOWEL PROBLEMS  UNUSUAL RASH Items with * indicate a potential emergency and should be followed up as soon as possible.  One of the nurses will contact you 24 hours after your treatment. Please let the nurse know about any problems that you may have experienced. Feel free to call the clinic you have any questions or concerns. The clinic phone number is [phone]   I have been informed and understand all the instructions given to me. I know to contact the clinic, my physician, or go to the Emergency Department if any problems should occur. I do not have any questions  at this time, but understand that I may call the clinic during office hours   should I have any questions or need assistance in obtaining follow up care.    __________________________________________  _____________  __________ Signature of Patient or Authorized Representative            Date                   Time    __________________________________________ Nurse's Signature    

## 2013-10-28 ENCOUNTER — Telehealth: Payer: Self-pay | Admitting: Hematology & Oncology

## 2013-10-28 MED ORDER — CAPECITABINE 500 MG PO TABS: ORAL_TABLET | ORAL | Status: DC

## 2013-10-28 NOTE — Telephone Encounter (Signed)
Left message with 6-10 appointment

## 2013-10-28 NOTE — Progress Notes (Signed)
Hematology and Oncology Follow Up Visit  Ayven Pheasant [id number] 1951-04-26 63 y.o. 10/28/2013   Principle Diagnosis:   Metastatic colon cancer    Current Therapy:    S/p 8 cycles of FOLFOX/Avastin     Interim History:  Mr.  Nettleton is back for followup. He is doing fairly well. Again, he is tolerating treatment nice and. He's not had any problems with nausea vomiting. He's eating well. He did have one episode of abdominal pain which lasted one day.  He's not had constipation. He's had no rashes. He's had no leg swelling. He's had no cough. Everyday go ahead and repeat his CT scans. He continues to respond. His disease, which is in his liver, continues to improve. Has calcifications with his tumors that to me indicate necrosis.  His CEA continues to come down. His CEA today I think is down to 71.  He really is getting tired of the 5-FU pump. We'll have to see about Xeloda for him.  Medications: Current outpatient prescriptions:amLODipine-benazepril (LOTREL) 5-20 MG per capsule, Take 1 capsule by mouth daily. , Disp: , Rfl: ;  Coenzyme Q10 10 MG capsule, Take 10 mg by mouth daily., Disp: , Rfl: ;  gabapentin (NEURONTIN) 100 MG capsule, Take 100 mg by mouth 3 (three) times daily. PT ONLY TAKES ONCE DAILY, Disp: , Rfl:  lidocaine-prilocaine (EMLA) cream, Apply 1 application topically as needed. Apply quarter sized amount to portacath site 1-2 hours prior to chemotherapy appt.  Cover with saran wrap., Disp: 30 g, Rfl: 3;  dexamethasone (DECADRON) 4 MG tablet, Take 8 mg by mouth 2 (two) times daily with a meal. HR DOES NOT TAKE, Disp: , Rfl: ;  LORazepam (ATIVAN) 0.5 MG tablet, Take 0.5 mg by mouth as needed (Nausea or vomiting)., Disp: , Rfl:  ondansetron (ZOFRAN) 8 MG tablet, Take 8 mg by mouth 2 (two) times daily. Take two times a day starting the day after chemo for 2 days. DOES NOT TAKE, Disp: , Rfl:   Allergies: No Known Allergies  Past Medical History, Surgical history, Social history,  and Family History were reviewed and updated.  Review of Systems: As above  Physical Exam:  height is 5\' 5"  (1.651 m) and weight is 189 lb (85.73 kg). His oral temperature is 98.2 F (36.8 C). His blood pressure is 143/85 and his pulse is 77. His respiration is 16.   Well-developed and well-nourished white gentleman. Lungs are clear. Cardiac exam regular rate and rhythm. Abdomen is soft. Has good bowel sounds. There is no fluid wave. There is no palpable liver or spleen tip. Back exam no tenderness over the spine ribs or hips. Extremities shows no clubbing cyanosis or edema. Skin exam no rashes. Neurological exam is nonfocal.  Lab Results  Component Value Date   WBC 3.6* 10/27/2013   HGB 15.2 10/27/2013   HCT 43.9 10/27/2013   MCV 94 10/27/2013   PLT 149 10/27/2013     Chemistry      Component Value Date/Time   NA 140 10/27/2013 0844   NA 136* 06/22/2013 0800   K 4.1 10/27/2013 0844   K 4.4 06/22/2013 0800   CL 101 10/27/2013 0844   CL 97 06/22/2013 0800   CO2 28 10/27/2013 0844   CO2 25 06/22/2013 0800   BUN 13 10/27/2013 0844   BUN 15 06/22/2013 0800   CREATININE 0.8 10/27/2013 0844   CREATININE 0.68 06/22/2013 0800      Component Value Date/Time   CALCIUM 9.0 10/27/2013 0844  CALCIUM 9.5 06/22/2013 0800   ALKPHOS 102* 10/27/2013 0844   AST 50* 10/27/2013 0844   ALT 41 10/27/2013 0844   BILITOT 0.70 10/27/2013 0844         Impression and Plan: Mr. Bednarczyk is 63 year old with metastatic colon cancer. He had an incredible amount tumor volume Wolfer saw him. He is in great shape so. When ahead and treat him. He's responded as expected.  I still want to keep him on aggressive therapy. I know there has been some neuropathy issues with the oxaliplatin. However, I do think this is a very helpful part of the protocol.  I will see about Xeloda and how much it will cost him.  We will go ahead with the chemotherapy today with 5-FU.  I went over the scans with he and his wife. I showed him  the improvement that he is made. He continues to be very much encouraged.  We will have him come back in 2 weeks. Hopefully, we will be able to switch him over to Xeloda  Volanda Napoleon, MD 5/28/20156:48 AM

## 2013-10-29 ENCOUNTER — Ambulatory Visit: Payer: BC Managed Care – PPO

## 2013-10-29 VITALS — BP 137/79 | HR 86 | Temp 98.1°F | Resp 16

## 2013-10-29 DIAGNOSIS — C787 Secondary malignant neoplasm of liver and intrahepatic bile duct: Principal | ICD-10-CM

## 2013-10-29 DIAGNOSIS — C189 Malignant neoplasm of colon, unspecified: Secondary | ICD-10-CM

## 2013-10-29 NOTE — Patient Instructions (Signed)
[facility] Discharge Instructions for Patients Receiving Chemotherapy  Today you received the following chemotherapy agents 5FU  To help prevent nausea and vomiting after your treatment, we encourage you to take your nausea medication as prescribed   If you develop nausea and vomiting that is not controlled by your nausea medication, call the clinic.   BELOW ARE SYMPTOMS THAT SHOULD BE REPORTED IMMEDIATELY:  *FEVER GREATER THAN 100.5 F  *CHILLS WITH OR WITHOUT FEVER  NAUSEA AND VOMITING THAT IS NOT CONTROLLED WITH YOUR NAUSEA MEDICATION  *UNUSUAL SHORTNESS OF BREATH  *UNUSUAL BRUISING OR BLEEDING  TENDERNESS IN MOUTH AND THROAT WITH OR WITHOUT PRESENCE OF ULCERS  *URINARY PROBLEMS  *BOWEL PROBLEMS  UNUSUAL RASH Items with * indicate a potential emergency and should be followed up as soon as possible.  Feel free to call the clinic you have any questions or concerns. The clinic phone number is [phone].    

## 2013-11-10 ENCOUNTER — Other Ambulatory Visit: Payer: Self-pay | Admitting: *Deleted

## 2013-11-10 ENCOUNTER — Encounter: Payer: Self-pay | Admitting: Hematology & Oncology

## 2013-11-10 ENCOUNTER — Other Ambulatory Visit (HOSPITAL_BASED_OUTPATIENT_CLINIC_OR_DEPARTMENT_OTHER): Payer: BC Managed Care – PPO | Admitting: Lab

## 2013-11-10 ENCOUNTER — Telehealth: Payer: Self-pay | Admitting: Hematology & Oncology

## 2013-11-10 ENCOUNTER — Ambulatory Visit (HOSPITAL_BASED_OUTPATIENT_CLINIC_OR_DEPARTMENT_OTHER): Payer: BC Managed Care – PPO | Admitting: Hematology & Oncology

## 2013-11-10 ENCOUNTER — Ambulatory Visit (HOSPITAL_BASED_OUTPATIENT_CLINIC_OR_DEPARTMENT_OTHER): Payer: BC Managed Care – PPO

## 2013-11-10 VITALS — BP 151/86 | HR 85 | Temp 98.4°F | Resp 18 | Ht 65.0 in | Wt 188.0 lb

## 2013-11-10 DIAGNOSIS — C787 Secondary malignant neoplasm of liver and intrahepatic bile duct: Principal | ICD-10-CM

## 2013-11-10 DIAGNOSIS — C187 Malignant neoplasm of sigmoid colon: Secondary | ICD-10-CM

## 2013-11-10 DIAGNOSIS — C189 Malignant neoplasm of colon, unspecified: Secondary | ICD-10-CM

## 2013-11-10 DIAGNOSIS — Z5112 Encounter for antineoplastic immunotherapy: Secondary | ICD-10-CM

## 2013-11-10 DIAGNOSIS — Z5111 Encounter for antineoplastic chemotherapy: Secondary | ICD-10-CM

## 2013-11-10 LAB — CBC WITH DIFFERENTIAL (CANCER CENTER ONLY)
BASO#: 0.1 10*3/uL (ref 0.0–0.2)
BASO%: 1.1 % (ref 0.0–2.0)
EOS%: 0.9 % (ref 0.0–7.0)
Eosinophils Absolute: 0.1 10*3/uL (ref 0.0–0.5)
HCT: 43.5 % (ref 38.7–49.9)
HGB: 15 g/dL (ref 13.0–17.1)
LYMPH#: 1 10*3/uL (ref 0.9–3.3)
LYMPH%: 15 % (ref 14.0–48.0)
MCH: 32.6 pg (ref 28.0–33.4)
MCHC: 34.5 g/dL (ref 32.0–35.9)
MCV: 95 fL (ref 82–98)
MONO#: 1.3 10*3/uL — AB (ref 0.1–0.9)
MONO%: 20.5 % — ABNORMAL HIGH (ref 0.0–13.0)
NEUT%: 62.5 % (ref 40.0–80.0)
NEUTROS ABS: 4 10*3/uL (ref 1.5–6.5)
Platelets: 116 10*3/uL — ABNORMAL LOW (ref 145–400)
RBC: 4.6 10*6/uL (ref 4.20–5.70)
RDW: 13.9 % (ref 11.1–15.7)
WBC: 6.4 10*3/uL (ref 4.0–10.0)

## 2013-11-10 LAB — CMP (CANCER CENTER ONLY)
ALBUMIN: 3.4 g/dL (ref 3.3–5.5)
ALK PHOS: 101 U/L — AB (ref 26–84)
ALT: 23 U/L (ref 10–47)
AST: 27 U/L (ref 11–38)
BUN, Bld: 9 mg/dL (ref 7–22)
CHLORIDE: 101 meq/L (ref 98–108)
CO2: 28 mEq/L (ref 18–33)
Calcium: 9.1 mg/dL (ref 8.0–10.3)
Creat: 0.8 mg/dl (ref 0.6–1.2)
Glucose, Bld: 105 mg/dL (ref 73–118)
POTASSIUM: 4.2 meq/L (ref 3.3–4.7)
Sodium: 136 mEq/L (ref 128–145)
Total Bilirubin: 0.9 mg/dl (ref 0.20–1.60)
Total Protein: 7.5 g/dL (ref 6.4–8.1)

## 2013-11-10 LAB — CEA: CEA: 61.6 ng/mL — ABNORMAL HIGH (ref 0.0–5.0)

## 2013-11-10 MED ORDER — DEXAMETHASONE SODIUM PHOSPHATE 10 MG/ML IJ SOLN
INTRAMUSCULAR | Status: AC
  Filled 2013-11-10: qty 1

## 2013-11-10 MED ORDER — DEXTROSE 5 % IV SOLN
85.0000 mg/m2 | Freq: Once | INTRAVENOUS | Status: AC
  Administered 2013-11-10: 155 mg via INTRAVENOUS
  Filled 2013-11-10: qty 31

## 2013-11-10 MED ORDER — HEPARIN SOD (PORK) LOCK FLUSH 100 UNIT/ML IV SOLN
500.0000 [IU] | Freq: Once | INTRAVENOUS | Status: AC | PRN
  Administered 2013-11-10: 500 [IU]
  Filled 2013-11-10: qty 5

## 2013-11-10 MED ORDER — DEXAMETHASONE SODIUM PHOSPHATE 10 MG/ML IJ SOLN
10.0000 mg | Freq: Once | INTRAMUSCULAR | Status: AC
  Administered 2013-11-10: 10 mg via INTRAVENOUS

## 2013-11-10 MED ORDER — DEXTROSE 5 % IV SOLN
Freq: Once | INTRAVENOUS | Status: AC
Start: 2013-11-10 — End: 2013-11-10
  Administered 2013-11-10: 10:00:00 via INTRAVENOUS

## 2013-11-10 MED ORDER — SODIUM CHLORIDE 0.9 % IV SOLN
400.0000 mg | Freq: Once | INTRAVENOUS | Status: AC
  Administered 2013-11-10: 400 mg via INTRAVENOUS
  Filled 2013-11-10: qty 16

## 2013-11-10 MED ORDER — GABAPENTIN 100 MG PO CAPS: 100.0000 mg | ORAL_CAPSULE | Freq: Three times a day (TID) | ORAL | Status: DC

## 2013-11-10 MED ORDER — ONDANSETRON 8 MG/50ML IVPB (CHCC)
8.0000 mg | Freq: Once | INTRAVENOUS | Status: AC
  Administered 2013-11-10: 8 mg via INTRAVENOUS

## 2013-11-10 MED ORDER — SODIUM CHLORIDE 0.9 % IJ SOLN
10.0000 mL | INTRAMUSCULAR | Status: DC | PRN
  Administered 2013-11-10: 10 mL
  Filled 2013-11-10: qty 10

## 2013-11-10 MED ORDER — SODIUM CHLORIDE 0.9 % IV SOLN: Freq: Once | INTRAVENOUS | Status: DC

## 2013-11-10 NOTE — Telephone Encounter (Signed)
[employer] [place] - NPR  J1100 PR DEXAMETHASONE SODIUM PHOS J2405 PR ONDANSETRON HCL INJECTION  J9263 PR OXALIPLATIN  J0640 PR LEUCOVORIN CALCIUM INJECTION  J9190 PR FLUOROURACIL INJECTION J9035 PR BEVACIZUMAB INJECTION J7042 PR 5% DEXTROSE/NORMAL SALINE J7050 PR NORMAL SALINE SOLUTION INFUS A4216 PR STERILE WATER/SALINE, 10 ML J1642 PR INJ HEPARIN SODIUM PER 10 U  Colon cancer metastasized to liver - Primary 153.9, 197.7

## 2013-11-10 NOTE — Progress Notes (Signed)
Hematology and Oncology Follow Up Visit  Ryan Maynard [id number] May 24, 1951 63 y.o. 11/10/2013   Principle Diagnosis:   Metastatic colon cancer  Current Therapy:    CAPOX w/ Avastin     Interim History:  Mr.  Ryan Maynard is back for followup. He is doing well. We are going to switch him over to Xeloda. As we much more manageable for him during the summertime. He had no problems with 5- FU. He just did not like the pump.  His CEA has come down quite nicely. His last CEA was down to 71.  He's had no problems with cough. He's had no nausea vomiting. He's had some pain in the left upper quadrant. This is where he had a surgical scar from surgery back in 1969. 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 This does seem to help.  He still working. He is still very active at. His performance status is ECOG 0  Medications: Current outpatient prescriptions:amLODipine-benazepril (LOTREL) 5-20 MG per capsule, Take 1 capsule by mouth daily. , Disp: , Rfl: ;  capecitabine (XELODA) 500 MG tablet, Take 4 pills 2 times a day for 14 days only., Disp: 96 tablet, Rfl: 6;  Coenzyme Q10 10 MG capsule, Take 10 mg by mouth daily., Disp: , Rfl: ;  gabapentin (NEURONTIN) 100 MG capsule, Take 100 mg by mouth 3 (three) times daily. PT ONLY TAKES ONCE DAILY, Disp: , Rfl:  lidocaine-prilocaine (EMLA) cream, Apply 1 application topically as needed. Apply quarter sized amount to portacath site 1-2 hours prior to chemotherapy appt.  Cover with saran wrap., Disp: 30 g, Rfl: 3;  dexamethasone (DECADRON) 4 MG tablet, Take 8 mg by mouth 2 (two) times daily with a meal. HR DOES NOT TAKE, Disp: , Rfl: ;  LORazepam (ATIVAN) 0.5 MG tablet, Take 0.5 mg by mouth as needed (Nausea or vomiting)., Disp: , Rfl:  ondansetron (ZOFRAN) 8 MG tablet, Take 8 mg by mouth 2 (two) times daily. Take two times a day starting the day after chemo for 2 days. DOES NOT TAKE, Disp: , Rfl:  No current facility-administered  medications for this visit. Facility-Administered Medications Ordered in Other Visits: dexamethasone (DECADRON) injection 10 mg, 10 mg, Intravenous, Once, Volanda Napoleon, MD;  dextrose 5 % solution, , Intravenous, Once, Volanda Napoleon, MD;  heparin lock flush 100 unit/mL, 500 Units, Intracatheter, Once PRN, Volanda Napoleon, MD;  ondansetron (ZOFRAN) IVPB 8 mg, 8 mg, Intravenous, Once, Volanda Napoleon, MD oxaliplatin (ELOXATIN) 155 mg in dextrose 5 % 500 mL chemo infusion, 85 mg/m2 (Treatment Plan Actual), Intravenous, Once, Volanda Napoleon, MD;  sodium chloride 0.9 % injection 10 mL, 10 mL, Intracatheter, PRN, Volanda Napoleon, MD  Allergies: No Known Allergies  Past Medical History, Surgical history, Social history, and Family History were reviewed and updated.  Review of Systems: As above  Physical Exam:  height is 5\' 5"  (1.651 m) and weight is 188 lb (85.276 kg). His oral temperature is 98.4 F (36.9 C). His blood pressure is 151/86 and his pulse is 85. His respiration is 18.   Well-developed and well-nourished white,. Lungs are clear. Oral exam shows no leukocytes. Cardiac exam regular rhythm. Abdomen is soft. Has good bowel sounds. There is no fluid wave. There is no palpable hepatomegaly. I cannot palpate his spleen. Back exam no tenderness over the spine. Extremities no clubbing cyanosis or edema. Skin exam no rashes. Neurological exam is nonfocal.  Lab Results  Component Value  Date   WBC 6.4 11/10/2013   HGB 15.0 11/10/2013   HCT 43.5 11/10/2013   MCV 95 11/10/2013   PLT 116* 11/10/2013     Chemistry      Component Value Date/Time   NA 136 11/10/2013 0824   NA 136* 06/22/2013 0800   K 4.2 11/10/2013 0824   K 4.4 06/22/2013 0800   CL 101 11/10/2013 0824   CL 97 06/22/2013 0800   CO2 28 11/10/2013 0824   CO2 25 06/22/2013 0800   BUN 9 11/10/2013 0824   BUN 15 06/22/2013 0800   CREATININE 0.8 11/10/2013 0824   CREATININE 0.68 06/22/2013 0800      Component Value Date/Time   CALCIUM 9.1  11/10/2013 0824   CALCIUM 9.5 06/22/2013 0800   ALKPHOS 101* 11/10/2013 0824   AST 27 11/10/2013 0824   ALT 23 11/10/2013 0824   BILITOT 0.90 11/10/2013 0824         Impression and Plan: [name] is 63 year old coming with metastatic colon cancer. He's done incredibly well. His responses been incredibly dramatic. His initial CEA was over 4000.  We will switch him over to Xeloda now. We will continue the oxaliplatinum and Avastin.  He will be going on vacation in one month. He really wants to take a break through medication. I have no problems with this. We will just move his next appointment back a couple weeks.  I still am thinking about getting surgery involved for him to see if they can resect what is in his liver. This might be tough but I think for [name] is might be the best way to try to give him long survival.  We will plan to see him back after he is back from vacation.   Volanda Napoleon, MD 6/10/20159:44 AM

## 2013-11-10 NOTE — Patient Instructions (Signed)
[facility] Discharge Instructions for Patients Receiving Chemotherapy  Today you received the following chemotherapy agents Oxaliplatin, 5FU, Leucovorin To help prevent nausea and vomiting after your treatment, we encourage you to take your nausea medication   1) Zofran  (Ondansetron) 8 mg by mouth one in am and one in pm beginning 3 days after chemotherapy.   Take this for 3 days.    2)Compazine  (Prochlorperazine)10 mg by mouth every 6 hours as needed for nausea or vomiting,  3) Decadron  (Dexamethasone 4 mg)  Take 2 tablets by mouth 2 times daily with a meal.  Begin taking it the day after chemotherapy for 3 days.    4) Ativan (Lorazepam)- Take .5 mg tablet by mouth or under tongue every 6 hours for nausea, or for anxiety.     If you develop nausea and vomiting that is not controlled by your nausea medication, call the clinic [phone] If it is after clinic hours your family physician or the after hours number for the clinic or go to the Emergency Department.   BELOW ARE SYMPTOMS THAT SHOULD BE REPORTED IMMEDIATELY:  *FEVER GREATER THAN 100.5 F  *CHILLS WITH OR WITHOUT FEVER  NAUSEA AND VOMITING THAT IS NOT CONTROLLED WITH YOUR NAUSEA MEDICATION  *UNUSUAL SHORTNESS OF BREATH  *UNUSUAL BRUISING OR BLEEDING  TENDERNESS IN MOUTH AND THROAT WITH OR WITHOUT PRESENCE OF ULCERS  *URINARY PROBLEMS  *BOWEL PROBLEMS  UNUSUAL RASH Items with * indicate a potential emergency and should be followed up as soon as possible.  One of the nurses will contact you 24 hours after your treatment. Please let the nurse know about any problems that you may have experienced. Feel free to call the clinic you have any questions or concerns. The clinic phone number is [phone]   I have been informed and understand all the instructions given to me. I know to contact the clinic, my physician, or go to the Emergency Department if any problems should occur. I do not have any questions  at this time, but understand that I may call the clinic during office hours   should I have any questions or need assistance in obtaining follow up care.    __________________________________________  _____________  __________ Signature of Patient or Authorized Representative            Date                   Time    __________________________________________ Nurse's Signature

## 2013-11-11 ENCOUNTER — Telehealth: Payer: Self-pay | Admitting: *Deleted

## 2013-11-11 NOTE — Telephone Encounter (Addendum)
Message copied by Lenn Sink on Thu Nov 11, 2013 10:30 AM ------      Message from: Burney Gauze R      Created: Thu Nov 11, 2013  6:49 AM       Call - CEA down to 61!!  Great job!!  Film/video editor ------Informed pt that CEA is down to 61!

## 2013-12-22 ENCOUNTER — Ambulatory Visit (HOSPITAL_BASED_OUTPATIENT_CLINIC_OR_DEPARTMENT_OTHER): Payer: BC Managed Care – PPO

## 2013-12-22 ENCOUNTER — Encounter: Payer: Self-pay | Admitting: Hematology & Oncology

## 2013-12-22 ENCOUNTER — Other Ambulatory Visit (HOSPITAL_BASED_OUTPATIENT_CLINIC_OR_DEPARTMENT_OTHER): Payer: BC Managed Care – PPO | Admitting: Lab

## 2013-12-22 ENCOUNTER — Other Ambulatory Visit: Payer: Self-pay | Admitting: *Deleted

## 2013-12-22 ENCOUNTER — Ambulatory Visit (HOSPITAL_BASED_OUTPATIENT_CLINIC_OR_DEPARTMENT_OTHER): Payer: BC Managed Care – PPO | Admitting: Hematology & Oncology

## 2013-12-22 VITALS — BP 146/90 | HR 80 | Temp 98.2°F | Resp 18 | Ht 65.0 in | Wt 195.0 lb

## 2013-12-22 DIAGNOSIS — C787 Secondary malignant neoplasm of liver and intrahepatic bile duct: Secondary | ICD-10-CM

## 2013-12-22 DIAGNOSIS — C187 Malignant neoplasm of sigmoid colon: Secondary | ICD-10-CM

## 2013-12-22 DIAGNOSIS — Z5111 Encounter for antineoplastic chemotherapy: Secondary | ICD-10-CM

## 2013-12-22 DIAGNOSIS — C189 Malignant neoplasm of colon, unspecified: Secondary | ICD-10-CM

## 2013-12-22 DIAGNOSIS — Z5112 Encounter for antineoplastic immunotherapy: Secondary | ICD-10-CM

## 2013-12-22 LAB — CBC WITH DIFFERENTIAL (CANCER CENTER ONLY)
BASO#: 0.1 10*3/uL (ref 0.0–0.2)
BASO%: 2.3 % — ABNORMAL HIGH (ref 0.0–2.0)
EOS%: 2.1 % (ref 0.0–7.0)
Eosinophils Absolute: 0.1 10*3/uL (ref 0.0–0.5)
HCT: 45 % (ref 38.7–49.9)
HGB: 15.4 g/dL (ref 13.0–17.1)
LYMPH#: 1 10*3/uL (ref 0.9–3.3)
LYMPH%: 21.7 % (ref 14.0–48.0)
MCH: 32.6 pg (ref 28.0–33.4)
MCHC: 34.2 g/dL (ref 32.0–35.9)
MCV: 95 fL (ref 82–98)
MONO#: 0.5 10*3/uL (ref 0.1–0.9)
MONO%: 11.6 % (ref 0.0–13.0)
NEUT#: 2.7 10*3/uL (ref 1.5–6.5)
NEUT%: 62.3 % (ref 40.0–80.0)
PLATELETS: 134 10*3/uL — AB (ref 145–400)
RBC: 4.73 10*6/uL (ref 4.20–5.70)
RDW: 13.4 % (ref 11.1–15.7)
WBC: 4.4 10*3/uL (ref 4.0–10.0)

## 2013-12-22 LAB — CMP (CANCER CENTER ONLY)
ALT(SGPT): 39 U/L (ref 10–47)
AST: 42 U/L — ABNORMAL HIGH (ref 11–38)
Albumin: 3.3 g/dL (ref 3.3–5.5)
Alkaline Phosphatase: 95 U/L — ABNORMAL HIGH (ref 26–84)
BUN: 14 mg/dL (ref 7–22)
CALCIUM: 9.3 mg/dL (ref 8.0–10.3)
CHLORIDE: 104 meq/L (ref 98–108)
CO2: 28 mEq/L (ref 18–33)
Creat: 0.9 mg/dl (ref 0.6–1.2)
Glucose, Bld: 119 mg/dL — ABNORMAL HIGH (ref 73–118)
Potassium: 3.6 mEq/L (ref 3.3–4.7)
Sodium: 140 mEq/L (ref 128–145)
Total Bilirubin: 0.7 mg/dl (ref 0.20–1.60)
Total Protein: 7.2 g/dL (ref 6.4–8.1)

## 2013-12-22 LAB — CEA: CEA: 28.8 ng/mL — AB (ref 0.0–5.0)

## 2013-12-22 LAB — UA PROTEIN, DIPSTICK - CHCC SATELLITE: Protein, Urine: NEGATIVE mg/dL

## 2013-12-22 LAB — LACTATE DEHYDROGENASE: LDH: 185 U/L (ref 94–250)

## 2013-12-22 MED ORDER — SODIUM CHLORIDE 0.9 % IV SOLN
400.0000 mg | Freq: Once | INTRAVENOUS | Status: AC
  Administered 2013-12-22: 400 mg via INTRAVENOUS
  Filled 2013-12-22: qty 16

## 2013-12-22 MED ORDER — SODIUM CHLORIDE 0.9 % IV SOLN
Freq: Once | INTRAVENOUS | Status: AC
  Administered 2013-12-22: 09:00:00 via INTRAVENOUS

## 2013-12-22 MED ORDER — AMLODIPINE BESY-BENAZEPRIL HCL 5-20 MG PO CAPS: 1.0000 | ORAL_CAPSULE | Freq: Every day | ORAL | Status: DC

## 2013-12-22 MED ORDER — ONDANSETRON 8 MG/50ML IVPB (CHCC)
8.0000 mg | Freq: Once | INTRAVENOUS | Status: AC
  Administered 2013-12-22: 8 mg via INTRAVENOUS

## 2013-12-22 MED ORDER — DEXTROSE 5 % IV SOLN
85.0000 mg/m2 | Freq: Once | INTRAVENOUS | Status: AC
  Administered 2013-12-22: 155 mg via INTRAVENOUS
  Filled 2013-12-22: qty 31

## 2013-12-22 MED ORDER — DEXTROSE 5 % IV SOLN: Freq: Once | INTRAVENOUS | Status: DC

## 2013-12-22 MED ORDER — HEPARIN SOD (PORK) LOCK FLUSH 100 UNIT/ML IV SOLN
500.0000 [IU] | Freq: Once | INTRAVENOUS | Status: AC | PRN
  Administered 2013-12-22: 500 [IU]
  Filled 2013-12-22: qty 5

## 2013-12-22 MED ORDER — CAPECITABINE 500 MG PO TABS: 1500.0000 mg | ORAL_TABLET | Freq: Two times a day (BID) | ORAL | Status: DC

## 2013-12-22 MED ORDER — SODIUM CHLORIDE 0.9 % IJ SOLN
10.0000 mL | INTRAMUSCULAR | Status: DC | PRN
  Administered 2013-12-22: 10 mL
  Filled 2013-12-22: qty 10

## 2013-12-22 MED ORDER — VITAMIN B-6 250 MG PO TABS: 250.0000 mg | ORAL_TABLET | Freq: Every day | ORAL | Status: DC

## 2013-12-22 MED ORDER — DEXAMETHASONE SODIUM PHOSPHATE 10 MG/ML IJ SOLN
10.0000 mg | Freq: Once | INTRAMUSCULAR | Status: AC
  Administered 2013-12-22: 10 mg via INTRAVENOUS

## 2013-12-22 MED ORDER — VITAMIN B-6 100 MG PO TABS: 100.0000 mg | ORAL_TABLET | Freq: Every day | ORAL | Status: DC

## 2013-12-22 MED ORDER — DEXAMETHASONE SODIUM PHOSPHATE 10 MG/ML IJ SOLN
INTRAMUSCULAR | Status: AC
  Filled 2013-12-22: qty 1

## 2013-12-22 NOTE — Progress Notes (Signed)
3Hematology and Oncology Follow Up Visit  Ryan Maynard [id number] Jun 01, 1951 63 y.o. 12/22/2013   Principle Diagnosis:  Metastatic colon cancer  Current Therapy:   CAPOX w/ Avastin     Interim History:  [name] is back for followup. We've held his chemotherapy for a couple weeks because he has been on vacation. Again, he is doing so well that I felt we could give him a couple weeks off.  He really had a hard time with the Xeloda. She was on 2000 mg twice a day. He said after 8 days she cannot take any longer. This may have feel really bad. He was not vomiting. Had no diarrhea. He just felt very tired and fatigued. He does have no energy.  He is feeling better now. [place]  His last CEA was down to 61. There is no abdominal pain. He's had no bleeding. He's had no melena. He's had no leg swelling. He's had no rashes. He's had no mouth sores.  Currently, his performance status is ECOG 1.  Medications: Current outpatient prescriptions:Coenzyme Q10 10 MG capsule, Take 10 mg by mouth daily., Disp: , Rfl: ;  dexamethasone (DECADRON) 4 MG tablet, Take 8 mg by mouth 2 (two) times daily with a meal. HR DOES NOT TAKE, Disp: , Rfl: ;  gabapentin (NEURONTIN) 100 MG capsule, Take 1 capsule (100 mg total) by mouth 3 (three) times daily. Pt okay to take only once a day, Disp: 30 capsule, Rfl: 3 lidocaine-prilocaine (EMLA) cream, Apply 1 application topically as needed. Apply quarter sized amount to portacath site 1-2 hours prior to chemotherapy appt.  Cover with saran wrap., Disp: 30 g, Rfl: 3;  LORazepam (ATIVAN) 0.5 MG tablet, Take 0.5 mg by mouth as needed (Nausea or vomiting)., Disp: , Rfl:  ondansetron (ZOFRAN) 8 MG tablet, Take 8 mg by mouth 2 (two) times daily. Take two times a day starting the day after chemo for 2 days. DOES NOT TAKE, Disp: , Rfl: ;  amLODipine-benazepril (LOTREL) 5-20 MG per capsule, Take 1 capsule by mouth daily., Disp: 90 capsule, Rfl: 2;   capecitabine (XELODA) 500 MG tablet, Take 3 tablets (1,500 mg total) by mouth 2 (two) times daily after a meal. Take 3 pills twice a day for 14 days., Disp: 84 tablet, Rfl: 4 pyridOXINE (VITAMIN B-6) 100 MG tablet, Take 1 tablet (100 mg total) by mouth daily. *Take 2 tablets by mouth daily!, Disp: 30 tablet, Rfl: 6 No current facility-administered medications for this visit. Facility-Administered Medications Ordered in Other Visits: dextrose 5 % solution, , Intravenous, Once, Volanda Napoleon, MD;  sodium chloride 0.9 % injection 10 mL, 10 mL, Intracatheter, PRN, Volanda Napoleon, MD, 10 mL at 12/22/13 1159  Allergies: No Known Allergies  Past Medical History, Surgical history, Social history, and Family History were reviewed and updated.  Review of Systems: As above  Physical Exam:  height is 5\' 5"  (1.651 m) and weight is 195 lb (88.451 kg). His oral temperature is 98.2 F (36.8 C). His blood pressure is 146/90 and his pulse is 80. His respiration is 18.   Well-developed and well-nourished white woman in no obvious distress. Head and neck exam shows no scleral icterus. He has no mucositis. There is no adenopathy on the neck. Lungs are clear. Cardiac exam regular in rhythm with a normal S1 and S2. There are no murmurs rubs or bruits. Abdomen is soft. Has good bowel sounds. There is no fluid  wave. There is no palpable liver or spleen tip. Exam no tenderness over the spine ribs or hips. Fairly shows no clubbing cyanosis or edema. Skin exam no rashes.  Lab Results  Component Value Date   WBC 4.4 12/22/2013   HGB 15.4 12/22/2013   HCT 45.0 12/22/2013   MCV 95 12/22/2013   PLT 134* 12/22/2013     Chemistry      Component Value Date/Time   NA 140 12/22/2013 0752   NA 136* 06/22/2013 0800   K 3.6 12/22/2013 0752   K 4.4 06/22/2013 0800   CL 104 12/22/2013 0752   CL 97 06/22/2013 0800   CO2 28 12/22/2013 0752   CO2 25 06/22/2013 0800   BUN 14 12/22/2013 0752   BUN 15 06/22/2013 0800   CREATININE 0.9  12/22/2013 0752   CREATININE 0.68 06/22/2013 0800      Component Value Date/Time   CALCIUM 9.3 12/22/2013 0752   CALCIUM 9.5 06/22/2013 0800   ALKPHOS 95* 12/22/2013 0752   AST 42* 12/22/2013 0752   ALT 39 12/22/2013 0752   BILITOT 0.70 12/22/2013 0752         Impression and Plan: Ryan Maynard is 63 year old gentleman with metastatic colon cancer. He has done very well. We initially had him on FOLFOX with Avastin. He really wanted to stop the IV pump. As such, I switched him over to Xeloda.  I don't think we need to give up on the Xeloda. Ryan Maynard wanted to go back with the infusional pump. I told him that we could adjust his dose of Xeloda and see how he does.  We will try him on 1500 mg twice a day for 14 days. I think this would be reasonable.  I still think that we might be able to consider surgery. Again, I would like to continue to see a response.  We will plan to get him back in 3 weeks.  I spent a good half hour with him today.   Volanda Napoleon, MD 7/22/20152:59 PM

## 2013-12-22 NOTE — Patient Instructions (Signed)
[facility] Discharge Instructions for Patients Receiving Chemotherapy  Today you received the following chemotherapy agents Avastin and Oxaliplatin.  To help prevent nausea and vomiting after your treatment, we encourage you to take your nausea medications.   If you develop nausea and vomiting that is not controlled by your nausea medication, call the clinic.   BELOW ARE SYMPTOMS THAT SHOULD BE REPORTED IMMEDIATELY:  *FEVER GREATER THAN 100.5 F  *CHILLS WITH OR WITHOUT FEVER  NAUSEA AND VOMITING THAT IS NOT CONTROLLED WITH YOUR NAUSEA MEDICATION  *UNUSUAL SHORTNESS OF BREATH  *UNUSUAL BRUISING OR BLEEDING  TENDERNESS IN MOUTH AND THROAT WITH OR WITHOUT PRESENCE OF ULCERS  *URINARY PROBLEMS  *BOWEL PROBLEMS  UNUSUAL RASH Items with * indicate a potential emergency and should be followed up as soon as possible.  Feel free to call the clinic you have any questions or concerns. The clinic phone number is [phone].

## 2014-01-12 ENCOUNTER — Ambulatory Visit (HOSPITAL_BASED_OUTPATIENT_CLINIC_OR_DEPARTMENT_OTHER): Payer: BC Managed Care – PPO | Admitting: Hematology & Oncology

## 2014-01-12 ENCOUNTER — Encounter: Payer: Self-pay | Admitting: Hematology & Oncology

## 2014-01-12 ENCOUNTER — Other Ambulatory Visit (HOSPITAL_BASED_OUTPATIENT_CLINIC_OR_DEPARTMENT_OTHER): Payer: BC Managed Care – PPO | Admitting: Lab

## 2014-01-12 ENCOUNTER — Ambulatory Visit (HOSPITAL_BASED_OUTPATIENT_CLINIC_OR_DEPARTMENT_OTHER): Payer: BC Managed Care – PPO

## 2014-01-12 VITALS — BP 155/88 | HR 71 | Temp 98.2°F | Resp 18 | Ht 65.0 in | Wt 194.0 lb

## 2014-01-12 DIAGNOSIS — C787 Secondary malignant neoplasm of liver and intrahepatic bile duct: Secondary | ICD-10-CM

## 2014-01-12 DIAGNOSIS — Z5111 Encounter for antineoplastic chemotherapy: Secondary | ICD-10-CM

## 2014-01-12 DIAGNOSIS — Z5112 Encounter for antineoplastic immunotherapy: Secondary | ICD-10-CM

## 2014-01-12 DIAGNOSIS — C189 Malignant neoplasm of colon, unspecified: Secondary | ICD-10-CM

## 2014-01-12 DIAGNOSIS — C187 Malignant neoplasm of sigmoid colon: Secondary | ICD-10-CM

## 2014-01-12 LAB — CBC WITH DIFFERENTIAL (CANCER CENTER ONLY)
BASO#: 0.1 10*3/uL (ref 0.0–0.2)
BASO%: 2.4 % — AB (ref 0.0–2.0)
EOS%: 3.2 % (ref 0.0–7.0)
Eosinophils Absolute: 0.1 10*3/uL (ref 0.0–0.5)
HCT: 44.6 % (ref 38.7–49.9)
HEMOGLOBIN: 15.9 g/dL (ref 13.0–17.1)
LYMPH#: 1 10*3/uL (ref 0.9–3.3)
LYMPH%: 27.2 % (ref 14.0–48.0)
MCH: 33.2 pg (ref 28.0–33.4)
MCHC: 35.7 g/dL (ref 32.0–35.9)
MCV: 93 fL (ref 82–98)
MONO#: 0.5 10*3/uL (ref 0.1–0.9)
MONO%: 14.3 % — AB (ref 0.0–13.0)
NEUT%: 52.9 % (ref 40.0–80.0)
NEUTROS ABS: 2 10*3/uL (ref 1.5–6.5)
PLATELETS: 132 10*3/uL — AB (ref 145–400)
RBC: 4.79 10*6/uL (ref 4.20–5.70)
RDW: 14.9 % (ref 11.1–15.7)
WBC: 3.8 10*3/uL — ABNORMAL LOW (ref 4.0–10.0)

## 2014-01-12 LAB — CMP (CANCER CENTER ONLY)
ALT(SGPT): 34 U/L (ref 10–47)
AST: 40 U/L — ABNORMAL HIGH (ref 11–38)
Albumin: 3.6 g/dL (ref 3.3–5.5)
Alkaline Phosphatase: 86 U/L — ABNORMAL HIGH (ref 26–84)
BUN: 11 mg/dL (ref 7–22)
CALCIUM: 9 mg/dL (ref 8.0–10.3)
CHLORIDE: 100 meq/L (ref 98–108)
CO2: 28 meq/L (ref 18–33)
Creat: 0.7 mg/dl (ref 0.6–1.2)
Glucose, Bld: 99 mg/dL (ref 73–118)
POTASSIUM: 3.9 meq/L (ref 3.3–4.7)
Sodium: 142 mEq/L (ref 128–145)
Total Bilirubin: 0.8 mg/dl (ref 0.20–1.60)
Total Protein: 7.7 g/dL (ref 6.4–8.1)

## 2014-01-12 LAB — CEA: CEA: 24.5 ng/mL — ABNORMAL HIGH (ref 0.0–5.0)

## 2014-01-12 MED ORDER — DEXAMETHASONE SODIUM PHOSPHATE 10 MG/ML IJ SOLN
10.0000 mg | Freq: Once | INTRAMUSCULAR | Status: AC
  Administered 2014-01-12: 10 mg via INTRAVENOUS

## 2014-01-12 MED ORDER — SODIUM CHLORIDE 0.9 % IV SOLN
400.0000 mg | Freq: Once | INTRAVENOUS | Status: AC
  Administered 2014-01-12: 400 mg via INTRAVENOUS
  Filled 2014-01-12: qty 16

## 2014-01-12 MED ORDER — SODIUM CHLORIDE 0.9 % IV SOLN
Freq: Once | INTRAVENOUS | Status: AC
  Administered 2014-01-12: 10:00:00 via INTRAVENOUS

## 2014-01-12 MED ORDER — HEPARIN SOD (PORK) LOCK FLUSH 100 UNIT/ML IV SOLN
500.0000 [IU] | Freq: Once | INTRAVENOUS | Status: AC | PRN
  Administered 2014-01-12: 500 [IU]
  Filled 2014-01-12: qty 5

## 2014-01-12 MED ORDER — DEXAMETHASONE SODIUM PHOSPHATE 10 MG/ML IJ SOLN
INTRAMUSCULAR | Status: AC
  Filled 2014-01-12: qty 1

## 2014-01-12 MED ORDER — SODIUM CHLORIDE 0.9 % IJ SOLN
10.0000 mL | INTRAMUSCULAR | Status: DC | PRN
  Administered 2014-01-12: 10 mL
  Filled 2014-01-12: qty 10

## 2014-01-12 MED ORDER — ONDANSETRON 8 MG/50ML IVPB (CHCC)
8.0000 mg | Freq: Once | INTRAVENOUS | Status: AC
  Administered 2014-01-12: 8 mg via INTRAVENOUS

## 2014-01-12 MED ORDER — DEXTROSE 5 % IV SOLN
Freq: Once | INTRAVENOUS | Status: AC
  Administered 2014-01-12: 11:00:00 via INTRAVENOUS

## 2014-01-12 MED ORDER — OXALIPLATIN CHEMO INJECTION 100 MG/20ML
85.0000 mg/m2 | Freq: Once | INTRAVENOUS | Status: AC
  Administered 2014-01-12: 155 mg via INTRAVENOUS
  Filled 2014-01-12: qty 31

## 2014-01-12 NOTE — Patient Instructions (Signed)
[facility] Discharge Instructions for Patients Receiving Chemotherapy  Today you received the following chemotherapy agents Avastin and Oxaliplatin.  To help prevent nausea and vomiting after your treatment, we encourage you to take your nausea medications.   If you develop nausea and vomiting that is not controlled by your nausea medication, call the clinic.   BELOW ARE SYMPTOMS THAT SHOULD BE REPORTED IMMEDIATELY:  *FEVER GREATER THAN 100.5 F  *CHILLS WITH OR WITHOUT FEVER  NAUSEA AND VOMITING THAT IS NOT CONTROLLED WITH YOUR NAUSEA MEDICATION  *UNUSUAL SHORTNESS OF BREATH  *UNUSUAL BRUISING OR BLEEDING  TENDERNESS IN MOUTH AND THROAT WITH OR WITHOUT PRESENCE OF ULCERS  *URINARY PROBLEMS  *BOWEL PROBLEMS  UNUSUAL RASH Items with * indicate a potential emergency and should be followed up as soon as possible.  Feel free to call the clinic you have any questions or concerns. The clinic phone number is [phone].

## 2014-01-12 NOTE — Progress Notes (Signed)
Hematology and Oncology Follow Up Visit  Ryan Maynard [id number] November 17, 1950 63 y.o. 01/12/2014   Principle Diagnosis:  Metastatic colon cancer  Current Therapy:   CAPOX w/ Avastin s/p 2 cycles     Interim History:  [name] is back for followup. He is looking quite good. We've adjusted his dose of Xeloda back a little bit. He really without hard time with the 2000 mg twice a day dose. He now is on 1500 mg twice a day. This worked out better for him.  His last CEA that we checked on him was down to 29. Prior to that, it was 35.  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 He is still a to do things that he likes to do. Patient says that he developed a rash from vitamin B6. Have not heard of a rash from vitamin B6. He said the rash was on the inside of his thighs. There was no rales.  He's had no diarrhea. That has been good. He wants to try to lose a little weight. I think this would be okay.  He's had no cough. There is no bleeding. He's had no visual problems. He's had no headache.  Overall, his performance status is ECOG 1.  Medications: Current outpatient prescriptions:amLODipine-benazepril (LOTREL) 5-20 MG per capsule, Take 1 capsule by mouth daily., Disp: 90 capsule, Rfl: 2;  capecitabine (XELODA) 500 MG tablet, Take 3 tablets (1,500 mg total) by mouth 2 (two) times daily after a meal. Take 3 pills twice a day for 14 days., Disp: 84 tablet, Rfl: 4;  Coenzyme Q10 10 MG capsule, Take 10 mg by mouth daily., Disp: , Rfl:  dexamethasone (DECADRON) 4 MG tablet, Take 8 mg by mouth 2 (two) times daily with a meal. HR DOES NOT TAKE, Disp: , Rfl: ;  gabapentin (NEURONTIN) 100 MG capsule, Take 1 capsule (100 mg total) by mouth 3 (three) times daily. Pt okay to take only once a day, Disp: 30 capsule, Rfl: 3 lidocaine-prilocaine (EMLA) cream, Apply 1 application topically as needed. Apply quarter sized amount to portacath site 1-2 hours prior to chemotherapy appt.  Cover with saran  wrap., Disp: 30 g, Rfl: 3;  LORazepam (ATIVAN) 0.5 MG tablet, Take 0.5 mg by mouth as needed (Nausea or vomiting)., Disp: , Rfl:  ondansetron (ZOFRAN) 8 MG tablet, Take 8 mg by mouth 2 (two) times daily. Take two times a day starting the day after chemo for 2 days. DOES NOT TAKE, Disp: , Rfl: ;  pyridOXINE (VITAMIN B-6) 100 MG tablet, Take 100 mg by mouth daily. STOP TAKING AFTER 2 DAYS D/T RASH ON BOTH LEGS., Disp: , Rfl:   Allergies: No Known Allergies  Past Medical History, Surgical history, Social history, and Family History were reviewed and updated.  Review of Systems: As above  Physical Exam:  height is 5\' 5"  (1.651 m) and weight is 194 lb (87.998 kg). His oral temperature is 98.2 F (36.8 C). His blood pressure is 155/88 and his pulse is 71. His respiration is 18.   Well-developed and well-nourished white gentleman. Head and neck exam shows no ocular or oral lesions. He has no palpable cervical or supraclavicular lymph nodes. Lungs are clear. Cardiac exam regular in rhythm with no murmurs rubs or bruits. Abdomen is soft. Has good bowel sounds. There is no fluid wave. There is no palpable liver or spleen tip. Neck exam shows no tenderness over the spine ribs or hips. Extremities shows no clubbing cyanosis or  edema. His steroid dose was twice. Has good muscle strength in his upper and lower extremities. Skin exam no rashes, ecchymosis or petechia. Neurological exam is nonfocal.  Lab Results  Component Value Date   WBC 3.8* 01/12/2014   HGB 15.9 01/12/2014   HCT 44.6 01/12/2014   MCV 93 01/12/2014   PLT 132* 01/12/2014     Chemistry      Component Value Date/Time   NA 142 01/12/2014 0812   NA 136* 06/22/2013 0800   K 3.9 01/12/2014 0812   K 4.4 06/22/2013 0800   CL 100 01/12/2014 0812   CL 97 06/22/2013 0800   CO2 28 01/12/2014 0812   CO2 25 06/22/2013 0800   BUN 11 01/12/2014 0812   BUN 15 06/22/2013 0800   CREATININE 0.7 01/12/2014 0812   CREATININE 0.68 06/22/2013 0800      Component  Value Date/Time   CALCIUM 9.0 01/12/2014 0812   CALCIUM 9.5 06/22/2013 0800   ALKPHOS 86* 01/12/2014 0812   AST 40* 01/12/2014 0812   ALT 34 01/12/2014 0812   BILITOT 0.80 01/12/2014 0812      CEA is 24.   Impression and Plan: Ryan Maynard is a 63 year old gentleman with metastatic colorectal cancer. He with incredibly extensive disease. He has responded very nicely. His CEA is down over 95%. His tumor burden has decreased nicely.  It still would be nice to try to resect out his disease. He is in great shape. He certainly could handle a fairly extensive surgery if that would be worthwhile to pursue.  For now, we will continue him on therapy. The chemotherapy is doing well. We are adjusting the doses for his quality of life.  We will plan to get back to see [place] in 3 weeks.  4 weeks of CAPOX/Avastin, then I will repeat his scans.   Volanda Napoleon, MD 8/12/20157:01 PM

## 2014-01-17 ENCOUNTER — Telehealth: Payer: Self-pay

## 2014-01-17 NOTE — Telephone Encounter (Addendum)
Message copied by Johny Drilling on Mon Jan 17, 2014 12:56 PM ------      Message from: Volanda Napoleon      Created: Sun Jan 16, 2014  8:57 PM       Call - CEA is still coming down.  Now is down to 24!!!  pete ------  Above information left on personalized VM. dph

## 2014-02-02 ENCOUNTER — Ambulatory Visit (HOSPITAL_BASED_OUTPATIENT_CLINIC_OR_DEPARTMENT_OTHER): Payer: BC Managed Care – PPO | Admitting: Hematology & Oncology

## 2014-02-02 ENCOUNTER — Encounter: Payer: Self-pay | Admitting: Hematology & Oncology

## 2014-02-02 ENCOUNTER — Ambulatory Visit (HOSPITAL_BASED_OUTPATIENT_CLINIC_OR_DEPARTMENT_OTHER): Payer: BC Managed Care – PPO

## 2014-02-02 ENCOUNTER — Other Ambulatory Visit (HOSPITAL_BASED_OUTPATIENT_CLINIC_OR_DEPARTMENT_OTHER): Payer: BC Managed Care – PPO | Admitting: Lab

## 2014-02-02 VITALS — BP 142/88 | HR 74 | Temp 97.7°F | Resp 18 | Ht 65.0 in | Wt 197.0 lb

## 2014-02-02 DIAGNOSIS — Z5111 Encounter for antineoplastic chemotherapy: Secondary | ICD-10-CM

## 2014-02-02 DIAGNOSIS — J208 Acute bronchitis due to other specified organisms: Secondary | ICD-10-CM

## 2014-02-02 DIAGNOSIS — C187 Malignant neoplasm of sigmoid colon: Secondary | ICD-10-CM

## 2014-02-02 DIAGNOSIS — C189 Malignant neoplasm of colon, unspecified: Secondary | ICD-10-CM

## 2014-02-02 DIAGNOSIS — C787 Secondary malignant neoplasm of liver and intrahepatic bile duct: Secondary | ICD-10-CM

## 2014-02-02 DIAGNOSIS — R05 Cough: Secondary | ICD-10-CM

## 2014-02-02 DIAGNOSIS — B9689 Other specified bacterial agents as the cause of diseases classified elsewhere: Secondary | ICD-10-CM

## 2014-02-02 DIAGNOSIS — Z5112 Encounter for antineoplastic immunotherapy: Secondary | ICD-10-CM

## 2014-02-02 DIAGNOSIS — R059 Cough, unspecified: Secondary | ICD-10-CM

## 2014-02-02 LAB — CBC WITH DIFFERENTIAL (CANCER CENTER ONLY)
BASO#: 0.1 10*3/uL (ref 0.0–0.2)
BASO%: 2.1 % — ABNORMAL HIGH (ref 0.0–2.0)
EOS%: 3.2 % (ref 0.0–7.0)
Eosinophils Absolute: 0.2 10*3/uL (ref 0.0–0.5)
HCT: 43.5 % (ref 38.7–49.9)
HGB: 15.4 g/dL (ref 13.0–17.1)
LYMPH#: 1.3 10*3/uL (ref 0.9–3.3)
LYMPH%: 26.5 % (ref 14.0–48.0)
MCH: 33.3 pg (ref 28.0–33.4)
MCHC: 35.4 g/dL (ref 32.0–35.9)
MCV: 94 fL (ref 82–98)
MONO#: 0.8 10*3/uL (ref 0.1–0.9)
MONO%: 17.2 % — ABNORMAL HIGH (ref 0.0–13.0)
NEUT#: 2.4 10*3/uL (ref 1.5–6.5)
NEUT%: 51 % (ref 40.0–80.0)
PLATELETS: 147 10*3/uL (ref 145–400)
RBC: 4.62 10*6/uL (ref 4.20–5.70)
RDW: 16.2 % — AB (ref 11.1–15.7)
WBC: 4.8 10*3/uL (ref 4.0–10.0)

## 2014-02-02 LAB — CEA: CEA: 19.5 ng/mL — AB (ref 0.0–5.0)

## 2014-02-02 LAB — CMP (CANCER CENTER ONLY)
ALT(SGPT): 28 U/L (ref 10–47)
AST: 34 U/L (ref 11–38)
Albumin: 3.6 g/dL (ref 3.3–5.5)
Alkaline Phosphatase: 77 U/L (ref 26–84)
BILIRUBIN TOTAL: 1.1 mg/dL (ref 0.20–1.60)
BUN, Bld: 15 mg/dL (ref 7–22)
CO2: 27 mEq/L (ref 18–33)
CREATININE: 0.8 mg/dL (ref 0.6–1.2)
Calcium: 8.8 mg/dL (ref 8.0–10.3)
Chloride: 103 mEq/L (ref 98–108)
GLUCOSE: 92 mg/dL (ref 73–118)
Potassium: 3.7 mEq/L (ref 3.3–4.7)
Sodium: 140 mEq/L (ref 128–145)
TOTAL PROTEIN: 7.6 g/dL (ref 6.4–8.1)

## 2014-02-02 LAB — LACTATE DEHYDROGENASE: LDH: 192 U/L (ref 94–250)

## 2014-02-02 LAB — PREALBUMIN: Prealbumin: 25.5 mg/dL (ref 17.0–34.0)

## 2014-02-02 MED ORDER — SODIUM CHLORIDE 0.9 % IJ SOLN
3.0000 mL | INTRAMUSCULAR | Status: DC | PRN
  Filled 2014-02-02: qty 10

## 2014-02-02 MED ORDER — HEPARIN SOD (PORK) LOCK FLUSH 100 UNIT/ML IV SOLN
500.0000 [IU] | Freq: Once | INTRAVENOUS | Status: AC | PRN
  Administered 2014-02-02: 500 [IU]
  Filled 2014-02-02: qty 5

## 2014-02-02 MED ORDER — DEXTROSE 5 % IV SOLN
Freq: Once | INTRAVENOUS | Status: AC
  Administered 2014-02-02: 10:00:00 via INTRAVENOUS

## 2014-02-02 MED ORDER — DEXAMETHASONE SODIUM PHOSPHATE 20 MG/5ML IJ SOLN
INTRAMUSCULAR | Status: AC
Start: 2014-02-02 — End: 2014-02-02
  Filled 2014-02-02: qty 5

## 2014-02-02 MED ORDER — AZITHROMYCIN 250 MG PO TABS: ORAL_TABLET | ORAL | Status: DC

## 2014-02-02 MED ORDER — SODIUM CHLORIDE 0.9 % IV SOLN
400.0000 mg | Freq: Once | INTRAVENOUS | Status: AC
  Administered 2014-02-02: 400 mg via INTRAVENOUS
  Filled 2014-02-02: qty 16

## 2014-02-02 MED ORDER — SODIUM CHLORIDE 0.9 % IV SOLN
Freq: Once | INTRAVENOUS | Status: AC
  Administered 2014-02-02: 09:00:00 via INTRAVENOUS

## 2014-02-02 MED ORDER — DEXAMETHASONE SODIUM PHOSPHATE 10 MG/ML IJ SOLN
10.0000 mg | Freq: Once | INTRAMUSCULAR | Status: AC
  Administered 2014-02-02: 10 mg via INTRAVENOUS

## 2014-02-02 MED ORDER — HEPARIN SOD (PORK) LOCK FLUSH 100 UNIT/ML IV SOLN
250.0000 [IU] | Freq: Once | INTRAVENOUS | Status: DC | PRN
  Filled 2014-02-02: qty 5

## 2014-02-02 MED ORDER — OXALIPLATIN CHEMO INJECTION 100 MG/20ML
85.0000 mg/m2 | Freq: Once | INTRAVENOUS | Status: AC
  Administered 2014-02-02: 155 mg via INTRAVENOUS
  Filled 2014-02-02: qty 31

## 2014-02-02 MED ORDER — ALTEPLASE 2 MG IJ SOLR
2.0000 mg | Freq: Once | INTRAMUSCULAR | Status: DC | PRN
  Filled 2014-02-02: qty 2

## 2014-02-02 MED ORDER — SODIUM CHLORIDE 0.9 % IJ SOLN
10.0000 mL | INTRAMUSCULAR | Status: DC | PRN
  Administered 2014-02-02: 10 mL
  Filled 2014-02-02: qty 10

## 2014-02-02 MED ORDER — ONDANSETRON 8 MG/50ML IVPB (CHCC)
8.0000 mg | Freq: Once | INTRAVENOUS | Status: AC
  Administered 2014-02-02: 8 mg via INTRAVENOUS

## 2014-02-02 NOTE — Addendum Note (Signed)
Addended by: Volanda Napoleon on: 02/02/2014 06:15 PM   Modules accepted: Orders

## 2014-02-02 NOTE — Progress Notes (Signed)
Hematology and Oncology Follow Up Visit  Ryan Maynard [id number] [birth year] 63 y.o. 02/02/2014   Principle Diagnosis:  Metastatic colon cancer  Current Therapy:   CAPOX w/ Avastin s/p 3 cycles     Interim History:  Ryan Maynard is back for followup. He is doing fairly well. He does have a little bit of a "cold". I think he may need some antibiotics for this. He feels congested. He's coughing up a little bit. He says it he's not coughing up any purulent mucus.  His chemotherapy has worked incredibly well. He's done well with the [facility] treatment. This is a lot easier for him.  His last CEA was 24.5. Again, we first started, his CEA was over 4500.  He's had no abdominal pain. He's had no problems with diarrhea. He's had no leg swelling. He's had no rashes.  Overall, his performance status is ECOG 1 Medications: Current outpatient prescriptions:amLODipine-benazepril (LOTREL) 5-20 MG per capsule, Take 1 capsule by mouth daily., Disp: 90 capsule, Rfl: 2;  capecitabine (XELODA) 500 MG tablet, Take 3 tablets (1,500 mg total) by mouth 2 (two) times daily after a meal. Take 3 pills twice a day for 14 days., Disp: 84 tablet, Rfl: 4;  Coenzyme Q10 10 MG capsule, Take 10 mg by mouth daily., Disp: , Rfl:  dexamethasone (DECADRON) 4 MG tablet, Take 8 mg by mouth 2 (two) times daily with a meal. HR DOES NOT TAKE, Disp: , Rfl: ;  gabapentin (NEURONTIN) 100 MG capsule, Take 1 capsule (100 mg total) by mouth 3 (three) times daily. Pt okay to take only once a day, Disp: 30 capsule, Rfl: 3 lidocaine-prilocaine (EMLA) cream, Apply 1 application topically as needed. Apply quarter sized amount to portacath site 1-2 hours prior to chemotherapy appt.  Cover with saran wrap., Disp: 30 g, Rfl: 3;  LORazepam (ATIVAN) 0.5 MG tablet, Take 0.5 mg by mouth as needed (Nausea or vomiting)., Disp: , Rfl:  ondansetron (ZOFRAN) 8 MG tablet, Take 8 mg by mouth 2 (two) times daily. Take two times a day starting the day after  chemo for 2 days. DOES NOT TAKE, Disp: , Rfl: ;  pyridOXINE (VITAMIN B-6) 100 MG tablet, Take 100 mg by mouth daily. STOP TAKING AFTER 2 DAYS D/T RASH ON BOTH LEGS., Disp: , Rfl:   Allergies: No Known Allergies  Past Medical History, Surgical history, Social history, and Family History were reviewed and updated.  Review of Systems: As above  Physical Exam:  height is 5\' 5"  (1.651 m) and weight is 197 lb (89.359 kg). His oral temperature is 97.7 F (36.5 C). His blood pressure is 142/88 and his pulse is 74. His respiration is 18.   Well-developed and well-nourished white gentleman. Head and neck exam shows no ocular or oral lesions. He has no palpable cervical or supraclavicular lymph nodes. Lungs are clear. Cardiac exam regular in rhythm with no murmurs rubs or bruits. Abdomen is soft. Has good bowel sounds. There is no fluid wave. There is no palpable liver or spleen tip. Neck exam shows no tenderness over the spine ribs or hips. Extremities shows no clubbing, cyanosis or edema. Has good range of motion of his joints. Skin exam shows no rashes, ecchymoses or petechia. He has days garon is a burn on his back.  Lab Results  Component Value Date   WBC 4.8 02/02/2014   HGB 15.4 02/02/2014   HCT 43.5 02/02/2014   MCV 94 02/02/2014   PLT 147 02/02/2014  Chemistry      Component Value Date/Time   NA 140 02/02/2014 0757   NA 136* 06/22/2013 0800   K 3.7 02/02/2014 0757   K 4.4 06/22/2013 0800   CL 103 02/02/2014 0757   CL 97 06/22/2013 0800   CO2 27 02/02/2014 0757   CO2 25 06/22/2013 0800   BUN 15 02/02/2014 0757   BUN 15 06/22/2013 0800   CREATININE 0.8 02/02/2014 0757   CREATININE 0.68 06/22/2013 0800      Component Value Date/Time   CALCIUM 8.8 02/02/2014 0757   CALCIUM 9.5 06/22/2013 0800   ALKPHOS 77 02/02/2014 0757   AST 34 02/02/2014 0757   ALT 28 02/02/2014 0757   BILITOT 1.10 02/02/2014 0757      CEA is at 19.5   Impression and Plan: Ryan Maynard is 63 year old gentleman with metastatic colon  cancer. He has an incredibly well. His responses been dramatic.  We will go ahead and get him set up with another set of scans in about 3 weeks.  We will see how well he is responded.  Think that one possibility for him would be to consider intrahepatic therapy. Studies have shown that intrahepatic therapy with systemic chemotherapy does seem to have a survival advantage versus systemic chemotherapy alone.  Am still trying to consider surgery for him. Again, am not sure if he would be a candidate depending on how much liver lesions he has.  We'll with think that by the dramatic decrease in his CEA, that he might be a surgical candidate.  We will plan to get him back to see [place] in 4 weeks. This will give him time off so he go down to the beach with his wife.   Volanda Napoleon, MD 9/2/20156:06 PM

## 2014-02-02 NOTE — Patient Instructions (Signed)
[facility] Discharge Instructions for Patients Receiving Chemotherapy  Today you received the following chemotherapy agents Avastin and Oxaliplatin.  To help prevent nausea and vomiting after your treatment, we encourage you to take your nausea medications.   If you develop nausea and vomiting that is not controlled by your nausea medication, call the clinic.   BELOW ARE SYMPTOMS THAT SHOULD BE REPORTED IMMEDIATELY:  *FEVER GREATER THAN 100.5 F  *CHILLS WITH OR WITHOUT FEVER  NAUSEA AND VOMITING THAT IS NOT CONTROLLED WITH YOUR NAUSEA MEDICATION  *UNUSUAL SHORTNESS OF BREATH  *UNUSUAL BRUISING OR BLEEDING  TENDERNESS IN MOUTH AND THROAT WITH OR WITHOUT PRESENCE OF ULCERS  *URINARY PROBLEMS  *BOWEL PROBLEMS  UNUSUAL RASH Items with * indicate a potential emergency and should be followed up as soon as possible.  Feel free to call the clinic you have any questions or concerns. The clinic phone number is [phone].

## 2014-02-03 ENCOUNTER — Telehealth: Payer: Self-pay | Admitting: Hematology & Oncology

## 2014-02-03 NOTE — Telephone Encounter (Signed)
Left pt message to call for details of 9-24 and 9-30 appointments. I scheduled CT at [facility] with Sheron for 9-24 pt needs to be NPO 4 hrs and either come in at 9:30 am to drink contrast for 11 am scan or pick up contrast prior. Per MD 9-16 chemo order is not valid

## 2014-02-24 ENCOUNTER — Other Ambulatory Visit: Payer: BC Managed Care – PPO | Admitting: Lab

## 2014-02-24 ENCOUNTER — Ambulatory Visit: Payer: BC Managed Care – PPO

## 2014-02-24 ENCOUNTER — Ambulatory Visit: Payer: BC Managed Care – PPO | Admitting: Hematology & Oncology

## 2014-03-02 ENCOUNTER — Ambulatory Visit (HOSPITAL_BASED_OUTPATIENT_CLINIC_OR_DEPARTMENT_OTHER): Payer: BC Managed Care – PPO | Admitting: Hematology & Oncology

## 2014-03-02 ENCOUNTER — Other Ambulatory Visit (HOSPITAL_BASED_OUTPATIENT_CLINIC_OR_DEPARTMENT_OTHER): Payer: BC Managed Care – PPO | Admitting: Lab

## 2014-03-02 ENCOUNTER — Encounter (INDEPENDENT_AMBULATORY_CARE_PROVIDER_SITE_OTHER): Payer: Self-pay

## 2014-03-02 ENCOUNTER — Encounter: Payer: Self-pay | Admitting: Hematology & Oncology

## 2014-03-02 ENCOUNTER — Ambulatory Visit (HOSPITAL_BASED_OUTPATIENT_CLINIC_OR_DEPARTMENT_OTHER): Payer: BC Managed Care – PPO

## 2014-03-02 VITALS — BP 144/78 | HR 81 | Temp 98.1°F | Resp 18 | Ht 65.0 in | Wt 200.0 lb

## 2014-03-02 DIAGNOSIS — C787 Secondary malignant neoplasm of liver and intrahepatic bile duct: Secondary | ICD-10-CM

## 2014-03-02 DIAGNOSIS — C189 Malignant neoplasm of colon, unspecified: Secondary | ICD-10-CM

## 2014-03-02 DIAGNOSIS — Z23 Encounter for immunization: Secondary | ICD-10-CM

## 2014-03-02 DIAGNOSIS — C187 Malignant neoplasm of sigmoid colon: Secondary | ICD-10-CM

## 2014-03-02 DIAGNOSIS — Z5112 Encounter for antineoplastic immunotherapy: Secondary | ICD-10-CM

## 2014-03-02 DIAGNOSIS — Z5111 Encounter for antineoplastic chemotherapy: Secondary | ICD-10-CM

## 2014-03-02 DIAGNOSIS — J208 Acute bronchitis due to other specified organisms: Secondary | ICD-10-CM

## 2014-03-02 DIAGNOSIS — B9689 Other specified bacterial agents as the cause of diseases classified elsewhere: Secondary | ICD-10-CM

## 2014-03-02 LAB — CMP (CANCER CENTER ONLY)
ALT: 32 U/L (ref 10–47)
AST: 37 U/L (ref 11–38)
Albumin: 3.5 g/dL (ref 3.3–5.5)
Alkaline Phosphatase: 68 U/L (ref 26–84)
BUN: 11 mg/dL (ref 7–22)
CALCIUM: 9.4 mg/dL (ref 8.0–10.3)
CHLORIDE: 105 meq/L (ref 98–108)
CO2: 25 meq/L (ref 18–33)
Creat: 1 mg/dl (ref 0.6–1.2)
GLUCOSE: 101 mg/dL (ref 73–118)
Potassium: 3.7 mEq/L (ref 3.3–4.7)
Sodium: 141 mEq/L (ref 128–145)
Total Bilirubin: 0.9 mg/dl (ref 0.20–1.60)
Total Protein: 7.4 g/dL (ref 6.4–8.1)

## 2014-03-02 LAB — CBC WITH DIFFERENTIAL (CANCER CENTER ONLY)
BASO#: 0.1 10*3/uL (ref 0.0–0.2)
BASO%: 2.6 % — ABNORMAL HIGH (ref 0.0–2.0)
EOS ABS: 0.1 10*3/uL (ref 0.0–0.5)
EOS%: 2.6 % (ref 0.0–7.0)
HEMATOCRIT: 44.7 % (ref 38.7–49.9)
HGB: 15.9 g/dL (ref 13.0–17.1)
LYMPH#: 1.1 10*3/uL (ref 0.9–3.3)
LYMPH%: 25.3 % (ref 14.0–48.0)
MCH: 34 pg — AB (ref 28.0–33.4)
MCHC: 35.6 g/dL (ref 32.0–35.9)
MCV: 96 fL (ref 82–98)
MONO#: 0.7 10*3/uL (ref 0.1–0.9)
MONO%: 15.6 % — ABNORMAL HIGH (ref 0.0–13.0)
NEUT#: 2.3 10*3/uL (ref 1.5–6.5)
NEUT%: 53.9 % (ref 40.0–80.0)
Platelets: 111 10*3/uL — ABNORMAL LOW (ref 145–400)
RBC: 4.68 10*6/uL (ref 4.20–5.70)
RDW: 15.9 % — ABNORMAL HIGH (ref 11.1–15.7)
WBC: 4.2 10*3/uL (ref 4.0–10.0)

## 2014-03-02 LAB — UA PROTEIN, DIPSTICK - CHCC SATELLITE: Protein, Urine: NEGATIVE mg/dL

## 2014-03-02 LAB — LACTATE DEHYDROGENASE: LDH: 162 U/L (ref 94–250)

## 2014-03-02 LAB — CEA: CEA: 14.5 ng/mL — ABNORMAL HIGH (ref 0.0–5.0)

## 2014-03-02 MED ORDER — LORAZEPAM 0.5 MG PO TABS: 0.5000 mg | ORAL_TABLET | ORAL | Status: DC | PRN

## 2014-03-02 MED ORDER — DEXAMETHASONE SODIUM PHOSPHATE 20 MG/5ML IJ SOLN
INTRAMUSCULAR | Status: AC
  Filled 2014-03-02: qty 5

## 2014-03-02 MED ORDER — SODIUM CHLORIDE 0.9 % IJ SOLN
10.0000 mL | INTRAMUSCULAR | Status: DC | PRN
  Administered 2014-03-02: 10 mL
  Filled 2014-03-02: qty 10

## 2014-03-02 MED ORDER — DEXAMETHASONE SODIUM PHOSPHATE 10 MG/ML IJ SOLN
10.0000 mg | Freq: Once | INTRAMUSCULAR | Status: AC
  Administered 2014-03-02: 10 mg via INTRAVENOUS

## 2014-03-02 MED ORDER — GABAPENTIN 100 MG PO CAPS: 100.0000 mg | ORAL_CAPSULE | Freq: Three times a day (TID) | ORAL | Status: DC

## 2014-03-02 MED ORDER — DEXTROSE 5 % IV SOLN
Freq: Once | INTRAVENOUS | Status: AC
  Administered 2014-03-02: 09:00:00 via INTRAVENOUS

## 2014-03-02 MED ORDER — OXALIPLATIN CHEMO INJECTION 100 MG/20ML
76.0000 mg/m2 | Freq: Once | INTRAVENOUS | Status: AC
  Administered 2014-03-02: 155 mg via INTRAVENOUS
  Filled 2014-03-02: qty 31

## 2014-03-02 MED ORDER — ONDANSETRON 8 MG/50ML IVPB (CHCC)
8.0000 mg | Freq: Once | INTRAVENOUS | Status: AC
  Administered 2014-03-02: 8 mg via INTRAVENOUS

## 2014-03-02 MED ORDER — SODIUM CHLORIDE 0.9 % IV SOLN
Freq: Once | INTRAVENOUS | Status: AC
  Administered 2014-03-02: 12:00:00 via INTRAVENOUS

## 2014-03-02 MED ORDER — HEPARIN SOD (PORK) LOCK FLUSH 100 UNIT/ML IV SOLN
500.0000 [IU] | Freq: Once | INTRAVENOUS | Status: AC | PRN
  Administered 2014-03-02: 500 [IU]
  Filled 2014-03-02: qty 5

## 2014-03-02 MED ORDER — SODIUM CHLORIDE 0.9 % IV SOLN
400.0000 mg | Freq: Once | INTRAVENOUS | Status: AC
  Administered 2014-03-02: 400 mg via INTRAVENOUS
  Filled 2014-03-02: qty 16

## 2014-03-02 MED ORDER — INFLUENZA VAC SPLIT QUAD 0.5 ML IM SUSY
0.5000 mL | PREFILLED_SYRINGE | Freq: Once | INTRAMUSCULAR | Status: AC
  Administered 2014-03-02: 0.5 mL via INTRAMUSCULAR
  Filled 2014-03-02: qty 0.5

## 2014-03-02 NOTE — Progress Notes (Signed)
Hematology and Oncology Follow Up Visit  Ryan Maynard [id number] 1950-11-12 63 y.o. 03/02/2014   Principle Diagnosis:  Metastatic colon cancer  Current Therapy:   CAPOX w/ Avastin s/p 4cycles     Interim History:  [name] is back for followup. He continues to respond to treatment. His last CEA was down to 19.5.  He and his wife went to the beach during September. They were there for about a week. Had a great time. They  Ate well. He has had no problems with fatigue.  Ryan Maynard and his [place] next [place] They're looking forward to this.  He did have a CT scan done. This showed a continued response. He has continued decrease in his hepatic metastases. He has increased calcification of the metastasis. There is no disease that was noted outside of the liver. There is no comment on the primary tumor.  He does get tired with the Xeloda..  He's had no problems with diarrhea. He's had no leg swelling. He's had no rashes. he does of some issues with neuropathy.  He is on Neurontin because of shingles post herpetic neuralgia  Overall, his performance status is ECOG 1 Medications: Current outpatient prescriptions:amLODipine-benazepril (LOTREL) 5-20 MG per capsule, Take 1 capsule by mouth daily., Disp: 90 capsule, Rfl: 2;  capecitabine (XELODA) 500 MG tablet, Take 3 tablets (1,500 mg total) by mouth 2 (two) times daily after a meal. Take 3 pills twice a day for 14 days., Disp: 84 tablet, Rfl: 4;  Coenzyme Q10 10 MG capsule, Take 10 mg by mouth daily., Disp: , Rfl:  dexamethasone (DECADRON) 4 MG tablet, Take 8 mg by mouth 2 (two) times daily with a meal. HR DOES NOT TAKE, Disp: , Rfl: ;  gabapentin (NEURONTIN) 100 MG capsule, Take 1 capsule (100 mg total) by mouth 3 (three) times daily. Pt okay to take only once a day, Disp: 90 capsule, Rfl: 3 lidocaine-prilocaine (EMLA) cream, Apply 1 application topically as needed. Apply quarter sized amount to portacath site 1-2 hours prior to  chemotherapy appt.  Cover with saran wrap., Disp: 30 g, Rfl: 3;  LORazepam (ATIVAN) 0.5 MG tablet, Take 1 tablet (0.5 mg total) by mouth as needed (Nausea or vomiting)., Disp: 40 tablet, Rfl: 0 ondansetron (ZOFRAN) 8 MG tablet, Take 8 mg by mouth 2 (two) times daily. Take two times a day starting the day after chemo for 2 days. DOES NOT TAKE, Disp: , Rfl: ;  pyridOXINE (VITAMIN B-6) 100 MG tablet, Take 100 mg by mouth daily. STOP TAKING AFTER 2 DAYS D/T RASH ON BOTH LEGS., Disp: , Rfl:  No current facility-administered medications for this visit. Facility-Administered Medications Ordered in Other Visits: sodium chloride 0.9 % injection 10 mL, 10 mL, Intracatheter, PRN, Volanda Napoleon, MD, 10 mL at 03/02/14 1230  Allergies: No Known Allergies  Past Medical History, Surgical history, Social history, and Family History were reviewed and updated.  Review of Systems: As above  Physical Exam:  height is 5\' 5"  (1.651 m) and weight is 200 lb (90.719 kg). His oral temperature is 98.1 F (36.7 C). His blood pressure is 144/78 and his pulse is 81. His respiration is 18.   Well-developed and well-nourished white gentleman. Head and neck exam shows no ocular or oral lesions. He has no palpable cervical or supraclavicular lymph nodes. Lungs are clear. Cardiac exam regular in rhythm with no murmurs rubs or bruits. Abdomen is soft. Has good bowel sounds. There is no  fluid wave. There is no palpable liver or spleen tip. Neck exam shows no tenderness over the spine ribs or hips. Extremities shows no clubbing, cyanosis or edema. Has good range of motion of his joints. Skin exam shows no rashes, ecchymoses or petechia. He has days garon is a burn on his back.  Lab Results  Component Value Date   WBC 4.2 03/02/2014   HGB 15.9 03/02/2014   HCT 44.7 03/02/2014   MCV 96 03/02/2014   PLT 111* 03/02/2014     Chemistry      Component Value Date/Time   NA 141 03/02/2014 0801   NA 136* 06/22/2013 0800   K 3.7 03/02/2014  0801   K 4.4 06/22/2013 0800   CL 105 03/02/2014 0801   CL 97 06/22/2013 0800   CO2 25 03/02/2014 0801   CO2 25 06/22/2013 0800   BUN 11 03/02/2014 0801   BUN 15 06/22/2013 0800   CREATININE 1.0 03/02/2014 0801   CREATININE 0.68 06/22/2013 0800      Component Value Date/Time   CALCIUM 9.4 03/02/2014 0801   CALCIUM 9.5 06/22/2013 0800   ALKPHOS 68 03/02/2014 0801   AST 37 03/02/2014 0801   ALT 32 03/02/2014 0801   BILITOT 0.90 03/02/2014 0801         Impression and Plan: Ryan Maynard is 63 year old gentleman with metastatic colon cancer. He has an incredibly well. His responses been dramatic.  his CEA is usually very good measure of his response.  His quality of life is fantastic. He's doing pretty much what he wants.  For now, we will continue him on the chemotherapy.  At some point, I will get him set up with a PET scan. I think is it is important for [place] to see exactly what is active and was not active.  I still would like to consider surgery for him if possible to try to resect out any disease.  We will plan to get him back here in 4 weeks.  I spent a good 30 minutes with he and his wife.   Volanda Napoleon, MD 9/30/201512:42 PM

## 2014-03-02 NOTE — Patient Instructions (Signed)
Bevacizumab injection What is this medicine? BEVACIZUMAB (be va SIZ yoo mab) is a chemotherapy drug. It targets a protein found in many cancer cell types, and halts cancer growth. This drug treats many cancers including non-small cell lung cancer, ovarian cancer, cervical cancer, and colon or rectal cancer. It is usually given with other chemotherapy drugs. This medicine may be used for other purposes; ask your health care provider or pharmacist if you have questions. COMMON BRAND NAME(S): Avastin What should I tell my health care provider before I take this medicine? They need to know if you have any of these conditions: -blood clots -heart disease, including heart failure, heart attack, or chest pain (angina) -high blood pressure -infection (especially a virus infection such as chickenpox, cold sores, or herpes) -kidney disease -lung disease -prior chemotherapy with doxorubicin, daunorubicin, epirubicin, or other anthracycline type chemotherapy agents -recent or ongoing radiation therapy -recent surgery -stroke -an unusual or allergic reaction to bevacizumab, hamster proteins, mouse proteins, other medicines, foods, dyes, or preservatives -pregnant or trying to get pregnant -breast-feeding How should I use this medicine? This medicine is for infusion into a vein. It is given by a health care professional in a hospital or clinic setting. Talk to your pediatrician regarding the use of this medicine in children. Special care may be needed. Overdosage: If you think you have taken too much of this medicine contact a poison control center or emergency room at once. NOTE: This medicine is only for you. Do not share this medicine with others. What if I miss a dose? It is important not to miss your dose. Call your doctor or health care professional if you are unable to keep an appointment. What may interact with this medicine? Interactions are not expected. This list may not describe all  possible interactions. Give your health care provider a list of all the medicines, herbs, non-prescription drugs, or dietary supplements you use. Also tell them if you smoke, drink alcohol, or use illegal drugs. Some items may interact with your medicine. What should I watch for while using this medicine? Your condition will be monitored carefully while you are receiving this medicine. You will need important blood work and urine testing done while you are taking this medicine. During your treatment, let your health care professional know if you have any unusual symptoms, such as difficulty breathing. This medicine may rarely cause 'gastrointestinal perforation' (holes in the stomach, intestines or colon), a serious side effect requiring surgery to repair. This medicine should be started at least 28 days following major surgery and the site of the surgery should be totally healed. Check with your doctor before scheduling dental work or surgery while you are receiving this treatment. Talk to your doctor if you have recently had surgery or if you have a wound that has not healed. Do not become pregnant while taking this medicine. Women should inform their doctor if they wish to become pregnant or think they might be pregnant. There is a potential for serious side effects to an unborn child. Talk to your health care professional or pharmacist for more information. Do not breast-feed an infant while taking this medicine. This medicine has caused ovarian failure in some women. This medicine may interfere with the ability to have a child. You should talk to your doctor or health care professional if you are concerned about your fertility. What side effects may I notice from receiving this medicine? Side effects that you should report to your doctor or health care professional  as soon as possible: -allergic reactions like skin rash, itching or hives, swelling of the face, lips, or tongue -signs of infection -  fever or chills, cough, sore throat, pain or trouble passing urine -signs of decreased platelets or bleeding - bruising, pinpoint red spots on the skin, black, tarry stools, nosebleeds, blood in the urine -breathing problems -changes in vision -chest pain -confusion -jaw pain, especially after dental work -mouth sores -seizures -severe abdominal pain -severe headache -sudden numbness or weakness of the face, arm or leg -swelling of legs or ankles -symptoms of a stroke: change in mental awareness, inability to talk or move one side of the body (especially in patients with lung cancer) -trouble passing urine or change in the amount of urine -trouble speaking or understanding -trouble walking, dizziness, loss of balance or coordination Side effects that usually do not require medical attention (report to your doctor or health care professional if they continue or are bothersome): -constipation -diarrhea -dry skin -headache -loss of appetite -nausea, vomiting This list may not describe all possible side effects. Call your doctor for medical advice about side effects. You may report side effects to FDA at 1-800-FDA-1088. Where should I keep my medicine? This drug is given in a hospital or clinic and will not be stored at home. NOTE: This sheet is a summary. It may not cover all possible information. If you have questions about this medicine, talk to your doctor, pharmacist, or health care provider.  2015, Elsevier/Gold Standard. (2013-04-20 11:38:34) Oxaliplatin Injection What is this medicine? OXALIPLATIN (ox AL i PLA tin) is a chemotherapy drug. It targets fast dividing cells, like cancer cells, and causes these cells to die. This medicine is used to treat cancers of the colon and rectum, and many other cancers. This medicine may be used for other purposes; ask your health care provider or pharmacist if you have questions. COMMON BRAND NAME(S): Eloxatin What should I tell my health  care provider before I take this medicine? They need to know if you have any of these conditions: -kidney disease -an unusual or allergic reaction to oxaliplatin, other chemotherapy, other medicines, foods, dyes, or preservatives -pregnant or trying to get pregnant -breast-feeding How should I use this medicine? This drug is given as an infusion into a vein. It is administered in a hospital or clinic by a specially trained health care professional. Talk to your pediatrician regarding the use of this medicine in children. Special care may be needed. Overdosage: If you think you have taken too much of this medicine contact a poison control center or emergency room at once. NOTE: This medicine is only for you. Do not share this medicine with others. What if I miss a dose? It is important not to miss a dose. Call your doctor or health care professional if you are unable to keep an appointment. What may interact with this medicine? -medicines to increase blood counts like filgrastim, pegfilgrastim, sargramostim -probenecid -some antibiotics like amikacin, gentamicin, neomycin, polymyxin B, streptomycin, tobramycin -zalcitabine Talk to your doctor or health care professional before taking any of these medicines: -acetaminophen -aspirin -ibuprofen -ketoprofen -naproxen This list may not describe all possible interactions. Give your health care provider a list of all the medicines, herbs, non-prescription drugs, or dietary supplements you use. Also tell them if you smoke, drink alcohol, or use illegal drugs. Some items may interact with your medicine. What should I watch for while using this medicine? Your condition will be monitored carefully while you are receiving this  medicine. You will need important blood work done while you are taking this medicine. This medicine can make you more sensitive to cold. Do not drink cold drinks or use ice. Cover exposed skin before coming in contact with cold  temperatures or cold objects. When out in cold weather wear warm clothing and cover your mouth and nose to warm the air that goes into your lungs. Tell your doctor if you get sensitive to the cold. This drug may make you feel generally unwell. This is not uncommon, as chemotherapy can affect healthy cells as well as cancer cells. Report any side effects. Continue your course of treatment even though you feel ill unless your doctor tells you to stop. In some cases, you may be given additional medicines to help with side effects. Follow all directions for their use. Call your doctor or health care professional for advice if you get a fever, chills or sore throat, or other symptoms of a cold or flu. Do not treat yourself. This drug decreases your body's ability to fight infections. Try to avoid being around people who are sick. This medicine may increase your risk to bruise or bleed. Call your doctor or health care professional if you notice any unusual bleeding. Be careful brushing and flossing your teeth or using a toothpick because you may get an infection or bleed more easily. If you have any dental work done, tell your dentist you are receiving this medicine. Avoid taking products that contain aspirin, acetaminophen, ibuprofen, naproxen, or ketoprofen unless instructed by your doctor. These medicines may hide a fever. Do not become pregnant while taking this medicine. Women should inform their doctor if they wish to become pregnant or think they might be pregnant. There is a potential for serious side effects to an unborn child. Talk to your health care professional or pharmacist for more information. Do not breast-feed an infant while taking this medicine. Call your doctor or health care professional if you get diarrhea. Do not treat yourself. What side effects may I notice from receiving this medicine? Side effects that you should report to your doctor or health care professional as soon as  possible: -allergic reactions like skin rash, itching or hives, swelling of the face, lips, or tongue -low blood counts - This drug may decrease the number of white blood cells, red blood cells and platelets. You may be at increased risk for infections and bleeding. -signs of infection - fever or chills, cough, sore throat, pain or difficulty passing urine -signs of decreased platelets or bleeding - bruising, pinpoint red spots on the skin, black, tarry stools, nosebleeds -signs of decreased red blood cells - unusually weak or tired, fainting spells, lightheadedness -breathing problems -chest pain, pressure -cough -diarrhea -jaw tightness -mouth sores -nausea and vomiting -pain, swelling, redness or irritation at the injection site -pain, tingling, numbness in the hands or feet -problems with balance, talking, walking -redness, blistering, peeling or loosening of the skin, including inside the mouth -trouble passing urine or change in the amount of urine Side effects that usually do not require medical attention (report to your doctor or health care professional if they continue or are bothersome): -changes in vision -constipation -hair loss -loss of appetite -metallic taste in the mouth or changes in taste -stomach pain This list may not describe all possible side effects. Call your doctor for medical advice about side effects. You may report side effects to FDA at 1-800-FDA-1088. Where should I keep my medicine? This drug is given  in a hospital or clinic and will not be stored at home. NOTE: This sheet is a summary. It may not cover all possible information. If you have questions about this medicine, talk to your doctor, pharmacist, or health care provider.  2015, Elsevier/Gold Standard. (2007-12-15 17:22:47)

## 2014-03-04 ENCOUNTER — Encounter: Payer: Self-pay | Admitting: Hematology & Oncology

## 2014-03-16 ENCOUNTER — Other Ambulatory Visit: Payer: BC Managed Care – PPO | Admitting: Lab

## 2014-03-16 ENCOUNTER — Ambulatory Visit: Payer: BC Managed Care – PPO

## 2014-03-30 ENCOUNTER — Encounter: Payer: Self-pay | Admitting: Hematology & Oncology

## 2014-03-30 ENCOUNTER — Ambulatory Visit (HOSPITAL_BASED_OUTPATIENT_CLINIC_OR_DEPARTMENT_OTHER): Payer: BC Managed Care – PPO

## 2014-03-30 ENCOUNTER — Ambulatory Visit (HOSPITAL_BASED_OUTPATIENT_CLINIC_OR_DEPARTMENT_OTHER): Payer: BC Managed Care – PPO | Admitting: Hematology & Oncology

## 2014-03-30 ENCOUNTER — Other Ambulatory Visit (HOSPITAL_BASED_OUTPATIENT_CLINIC_OR_DEPARTMENT_OTHER): Payer: BC Managed Care – PPO | Admitting: Lab

## 2014-03-30 VITALS — BP 147/92 | HR 82 | Temp 98.4°F | Resp 18 | Ht 65.0 in | Wt 200.0 lb

## 2014-03-30 DIAGNOSIS — C787 Secondary malignant neoplasm of liver and intrahepatic bile duct: Secondary | ICD-10-CM

## 2014-03-30 DIAGNOSIS — C187 Malignant neoplasm of sigmoid colon: Secondary | ICD-10-CM

## 2014-03-30 DIAGNOSIS — C189 Malignant neoplasm of colon, unspecified: Secondary | ICD-10-CM

## 2014-03-30 DIAGNOSIS — Z5112 Encounter for antineoplastic immunotherapy: Secondary | ICD-10-CM

## 2014-03-30 DIAGNOSIS — Z5111 Encounter for antineoplastic chemotherapy: Secondary | ICD-10-CM

## 2014-03-30 LAB — CBC WITH DIFFERENTIAL (CANCER CENTER ONLY)
BASO#: 0.1 10*3/uL (ref 0.0–0.2)
BASO%: 1.6 % (ref 0.0–2.0)
EOS ABS: 0.1 10*3/uL (ref 0.0–0.5)
EOS%: 1.8 % (ref 0.0–7.0)
HCT: 44.9 % (ref 38.7–49.9)
HGB: 16 g/dL (ref 13.0–17.1)
LYMPH#: 1 10*3/uL (ref 0.9–3.3)
LYMPH%: 22.2 % (ref 14.0–48.0)
MCH: 34 pg — ABNORMAL HIGH (ref 28.0–33.4)
MCHC: 35.6 g/dL (ref 32.0–35.9)
MCV: 96 fL (ref 82–98)
MONO#: 0.6 10*3/uL (ref 0.1–0.9)
MONO%: 14.3 % — ABNORMAL HIGH (ref 0.0–13.0)
NEUT%: 60.1 % (ref 40.0–80.0)
NEUTROS ABS: 2.7 10*3/uL (ref 1.5–6.5)
PLATELETS: 114 10*3/uL — AB (ref 145–400)
RBC: 4.7 10*6/uL (ref 4.20–5.70)
RDW: 15.3 % (ref 11.1–15.7)
WBC: 4.4 10*3/uL (ref 4.0–10.0)

## 2014-03-30 LAB — CMP (CANCER CENTER ONLY)
ALT: 35 U/L (ref 10–47)
AST: 39 U/L — ABNORMAL HIGH (ref 11–38)
Albumin: 3.5 g/dL (ref 3.3–5.5)
Alkaline Phosphatase: 82 U/L (ref 26–84)
BILIRUBIN TOTAL: 1 mg/dL (ref 0.20–1.60)
BUN, Bld: 13 mg/dL (ref 7–22)
CALCIUM: 9.3 mg/dL (ref 8.0–10.3)
CHLORIDE: 102 meq/L (ref 98–108)
CO2: 25 meq/L (ref 18–33)
Creat: 0.8 mg/dl (ref 0.6–1.2)
Glucose, Bld: 106 mg/dL (ref 73–118)
Potassium: 3.7 mEq/L (ref 3.3–4.7)
Sodium: 141 mEq/L (ref 128–145)
Total Protein: 7.5 g/dL (ref 6.4–8.1)

## 2014-03-30 MED ORDER — DEXTROSE 5 % IV SOLN
Freq: Once | INTRAVENOUS | Status: AC
  Administered 2014-03-30: 09:00:00 via INTRAVENOUS

## 2014-03-30 MED ORDER — OXALIPLATIN CHEMO INJECTION 100 MG/20ML
85.0000 mg/m2 | Freq: Once | INTRAVENOUS | Status: AC
  Administered 2014-03-30: 175 mg via INTRAVENOUS
  Filled 2014-03-30: qty 35

## 2014-03-30 MED ORDER — DEXAMETHASONE SODIUM PHOSPHATE 10 MG/ML IJ SOLN
INTRAMUSCULAR | Status: AC
  Filled 2014-03-30: qty 1

## 2014-03-30 MED ORDER — HEPARIN SOD (PORK) LOCK FLUSH 100 UNIT/ML IV SOLN
500.0000 [IU] | Freq: Once | INTRAVENOUS | Status: AC | PRN
  Administered 2014-03-30: 500 [IU]
  Filled 2014-03-30: qty 5

## 2014-03-30 MED ORDER — SODIUM CHLORIDE 0.9 % IJ SOLN
10.0000 mL | INTRAMUSCULAR | Status: DC | PRN
  Administered 2014-03-30: 10 mL
  Filled 2014-03-30: qty 10

## 2014-03-30 MED ORDER — DEXAMETHASONE SODIUM PHOSPHATE 10 MG/ML IJ SOLN
10.0000 mg | Freq: Once | INTRAMUSCULAR | Status: AC
  Administered 2014-03-30: 10 mg via INTRAVENOUS

## 2014-03-30 MED ORDER — ONDANSETRON 8 MG/50ML IVPB (CHCC)
8.0000 mg | Freq: Once | INTRAVENOUS | Status: AC
  Administered 2014-03-30: 8 mg via INTRAVENOUS

## 2014-03-30 MED ORDER — SODIUM CHLORIDE 0.9 % IV SOLN
400.0000 mg | Freq: Once | INTRAVENOUS | Status: AC
  Administered 2014-03-30: 400 mg via INTRAVENOUS
  Filled 2014-03-30: qty 16

## 2014-03-30 NOTE — Patient Instructions (Signed)
Bevacizumab injection What is this medicine? BEVACIZUMAB (be va SIZ yoo mab) is a chemotherapy drug. It targets a protein found in many cancer cell types, and halts cancer growth. This drug treats many cancers including non-small cell lung cancer, ovarian cancer, cervical cancer, and colon or rectal cancer. It is usually given with other chemotherapy drugs. This medicine may be used for other purposes; ask your health care provider or pharmacist if you have questions. COMMON BRAND NAME(S): Avastin What should I tell my health care provider before I take this medicine? They need to know if you have any of these conditions: -blood clots -heart disease, including heart failure, heart attack, or chest pain (angina) -high blood pressure -infection (especially a virus infection such as chickenpox, cold sores, or herpes) -kidney disease -lung disease -prior chemotherapy with doxorubicin, daunorubicin, epirubicin, or other anthracycline type chemotherapy agents -recent or ongoing radiation therapy -recent surgery -stroke -an unusual or allergic reaction to bevacizumab, hamster proteins, mouse proteins, other medicines, foods, dyes, or preservatives -pregnant or trying to get pregnant -breast-feeding How should I use this medicine? This medicine is for infusion into a vein. It is given by a health care professional in a hospital or clinic setting. Talk to your pediatrician regarding the use of this medicine in children. Special care may be needed. Overdosage: If you think you have taken too much of this medicine contact a poison control center or emergency room at once. NOTE: This medicine is only for you. Do not share this medicine with others. What if I miss a dose? It is important not to miss your dose. Call your doctor or health care professional if you are unable to keep an appointment. What may interact with this medicine? Interactions are not expected. This list may not describe all  possible interactions. Give your health care provider a list of all the medicines, herbs, non-prescription drugs, or dietary supplements you use. Also tell them if you smoke, drink alcohol, or use illegal drugs. Some items may interact with your medicine. What should I watch for while using this medicine? Your condition will be monitored carefully while you are receiving this medicine. You will need important blood work and urine testing done while you are taking this medicine. During your treatment, let your health care professional know if you have any unusual symptoms, such as difficulty breathing. This medicine may rarely cause 'gastrointestinal perforation' (holes in the stomach, intestines or colon), a serious side effect requiring surgery to repair. This medicine should be started at least 28 days following major surgery and the site of the surgery should be totally healed. Check with your doctor before scheduling dental work or surgery while you are receiving this treatment. Talk to your doctor if you have recently had surgery or if you have a wound that has not healed. Do not become pregnant while taking this medicine. Women should inform their doctor if they wish to become pregnant or think they might be pregnant. There is a potential for serious side effects to an unborn child. Talk to your health care professional or pharmacist for more information. Do not breast-feed an infant while taking this medicine. This medicine has caused ovarian failure in some women. This medicine may interfere with the ability to have a child. You should talk to your doctor or health care professional if you are concerned about your fertility. What side effects may I notice from receiving this medicine? Side effects that you should report to your doctor or health care professional  as soon as possible: -allergic reactions like skin rash, itching or hives, swelling of the face, lips, or tongue -signs of infection -  fever or chills, cough, sore throat, pain or trouble passing urine -signs of decreased platelets or bleeding - bruising, pinpoint red spots on the skin, black, tarry stools, nosebleeds, blood in the urine -breathing problems -changes in vision -chest pain -confusion -jaw pain, especially after dental work -mouth sores -seizures -severe abdominal pain -severe headache -sudden numbness or weakness of the face, arm or leg -swelling of legs or ankles -symptoms of a stroke: change in mental awareness, inability to talk or move one side of the body (especially in patients with lung cancer) -trouble passing urine or change in the amount of urine -trouble speaking or understanding -trouble walking, dizziness, loss of balance or coordination Side effects that usually do not require medical attention (report to your doctor or health care professional if they continue or are bothersome): -constipation -diarrhea -dry skin -headache -loss of appetite -nausea, vomiting This list may not describe all possible side effects. Call your doctor for medical advice about side effects. You may report side effects to FDA at 1-800-FDA-1088. Where should I keep my medicine? This drug is given in a hospital or clinic and will not be stored at home. NOTE: This sheet is a summary. It may not cover all possible information. If you have questions about this medicine, talk to your doctor, pharmacist, or health care provider.  2015, Elsevier/Gold Standard. (2013-04-20 11:38:34) Oxaliplatin Injection What is this medicine? OXALIPLATIN (ox AL i PLA tin) is a chemotherapy drug. It targets fast dividing cells, like cancer cells, and causes these cells to die. This medicine is used to treat cancers of the colon and rectum, and many other cancers. This medicine may be used for other purposes; ask your health care provider or pharmacist if you have questions. COMMON BRAND NAME(S): Eloxatin What should I tell my health  care provider before I take this medicine? They need to know if you have any of these conditions: -kidney disease -an unusual or allergic reaction to oxaliplatin, other chemotherapy, other medicines, foods, dyes, or preservatives -pregnant or trying to get pregnant -breast-feeding How should I use this medicine? This drug is given as an infusion into a vein. It is administered in a hospital or clinic by a specially trained health care professional. Talk to your pediatrician regarding the use of this medicine in children. Special care may be needed. Overdosage: If you think you have taken too much of this medicine contact a poison control center or emergency room at once. NOTE: This medicine is only for you. Do not share this medicine with others. What if I miss a dose? It is important not to miss a dose. Call your doctor or health care professional if you are unable to keep an appointment. What may interact with this medicine? -medicines to increase blood counts like filgrastim, pegfilgrastim, sargramostim -probenecid -some antibiotics like amikacin, gentamicin, neomycin, polymyxin B, streptomycin, tobramycin -zalcitabine Talk to your doctor or health care professional before taking any of these medicines: -acetaminophen -aspirin -ibuprofen -ketoprofen -naproxen This list may not describe all possible interactions. Give your health care provider a list of all the medicines, herbs, non-prescription drugs, or dietary supplements you use. Also tell them if you smoke, drink alcohol, or use illegal drugs. Some items may interact with your medicine. What should I watch for while using this medicine? Your condition will be monitored carefully while you are receiving this  medicine. You will need important blood work done while you are taking this medicine. This medicine can make you more sensitive to cold. Do not drink cold drinks or use ice. Cover exposed skin before coming in contact with cold  temperatures or cold objects. When out in cold weather wear warm clothing and cover your mouth and nose to warm the air that goes into your lungs. Tell your doctor if you get sensitive to the cold. This drug may make you feel generally unwell. This is not uncommon, as chemotherapy can affect healthy cells as well as cancer cells. Report any side effects. Continue your course of treatment even though you feel ill unless your doctor tells you to stop. In some cases, you may be given additional medicines to help with side effects. Follow all directions for their use. Call your doctor or health care professional for advice if you get a fever, chills or sore throat, or other symptoms of a cold or flu. Do not treat yourself. This drug decreases your body's ability to fight infections. Try to avoid being around people who are sick. This medicine may increase your risk to bruise or bleed. Call your doctor or health care professional if you notice any unusual bleeding. Be careful brushing and flossing your teeth or using a toothpick because you may get an infection or bleed more easily. If you have any dental work done, tell your dentist you are receiving this medicine. Avoid taking products that contain aspirin, acetaminophen, ibuprofen, naproxen, or ketoprofen unless instructed by your doctor. These medicines may hide a fever. Do not become pregnant while taking this medicine. Women should inform their doctor if they wish to become pregnant or think they might be pregnant. There is a potential for serious side effects to an unborn child. Talk to your health care professional or pharmacist for more information. Do not breast-feed an infant while taking this medicine. Call your doctor or health care professional if you get diarrhea. Do not treat yourself. What side effects may I notice from receiving this medicine? Side effects that you should report to your doctor or health care professional as soon as  possible: -allergic reactions like skin rash, itching or hives, swelling of the face, lips, or tongue -low blood counts - This drug may decrease the number of white blood cells, red blood cells and platelets. You may be at increased risk for infections and bleeding. -signs of infection - fever or chills, cough, sore throat, pain or difficulty passing urine -signs of decreased platelets or bleeding - bruising, pinpoint red spots on the skin, black, tarry stools, nosebleeds -signs of decreased red blood cells - unusually weak or tired, fainting spells, lightheadedness -breathing problems -chest pain, pressure -cough -diarrhea -jaw tightness -mouth sores -nausea and vomiting -pain, swelling, redness or irritation at the injection site -pain, tingling, numbness in the hands or feet -problems with balance, talking, walking -redness, blistering, peeling or loosening of the skin, including inside the mouth -trouble passing urine or change in the amount of urine Side effects that usually do not require medical attention (report to your doctor or health care professional if they continue or are bothersome): -changes in vision -constipation -hair loss -loss of appetite -metallic taste in the mouth or changes in taste -stomach pain This list may not describe all possible side effects. Call your doctor for medical advice about side effects. You may report side effects to FDA at 1-800-FDA-1088. Where should I keep my medicine? This drug is given  in a hospital or clinic and will not be stored at home. NOTE: This sheet is a summary. It may not cover all possible information. If you have questions about this medicine, talk to your doctor, pharmacist, or health care provider.  2015, Elsevier/Gold Standard. (2007-12-15 17:22:47)
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
## 2014-03-31 LAB — CEA: CEA: 11.8 ng/mL — AB (ref 0.0–5.0)

## 2014-04-01 ENCOUNTER — Encounter: Payer: Self-pay | Admitting: *Deleted

## 2014-04-20 ENCOUNTER — Encounter: Payer: Self-pay | Admitting: Family

## 2014-04-20 ENCOUNTER — Ambulatory Visit (HOSPITAL_BASED_OUTPATIENT_CLINIC_OR_DEPARTMENT_OTHER): Payer: BC Managed Care – PPO | Admitting: Family

## 2014-04-20 ENCOUNTER — Other Ambulatory Visit (HOSPITAL_BASED_OUTPATIENT_CLINIC_OR_DEPARTMENT_OTHER): Payer: BC Managed Care – PPO | Admitting: Lab

## 2014-04-20 ENCOUNTER — Ambulatory Visit (HOSPITAL_BASED_OUTPATIENT_CLINIC_OR_DEPARTMENT_OTHER): Payer: BC Managed Care – PPO

## 2014-04-20 DIAGNOSIS — C189 Malignant neoplasm of colon, unspecified: Secondary | ICD-10-CM

## 2014-04-20 DIAGNOSIS — C787 Secondary malignant neoplasm of liver and intrahepatic bile duct: Secondary | ICD-10-CM

## 2014-04-20 DIAGNOSIS — Z5111 Encounter for antineoplastic chemotherapy: Secondary | ICD-10-CM

## 2014-04-20 DIAGNOSIS — C187 Malignant neoplasm of sigmoid colon: Secondary | ICD-10-CM

## 2014-04-20 DIAGNOSIS — Z5112 Encounter for antineoplastic immunotherapy: Secondary | ICD-10-CM

## 2014-04-20 LAB — CBC WITH DIFFERENTIAL (CANCER CENTER ONLY)
BASO#: 0.1 10*3/uL (ref 0.0–0.2)
BASO%: 1.6 % (ref 0.0–2.0)
EOS ABS: 0.1 10*3/uL (ref 0.0–0.5)
EOS%: 2.4 % (ref 0.0–7.0)
HEMATOCRIT: 41.6 % (ref 38.7–49.9)
HGB: 15.1 g/dL (ref 13.0–17.1)
LYMPH#: 1 10*3/uL (ref 0.9–3.3)
LYMPH%: 27.2 % (ref 14.0–48.0)
MCH: 35.2 pg — ABNORMAL HIGH (ref 28.0–33.4)
MCHC: 36.3 g/dL — ABNORMAL HIGH (ref 32.0–35.9)
MCV: 97 fL (ref 82–98)
MONO#: 0.7 10*3/uL (ref 0.1–0.9)
MONO%: 17.8 % — ABNORMAL HIGH (ref 0.0–13.0)
NEUT#: 1.9 10*3/uL (ref 1.5–6.5)
NEUT%: 51 % (ref 40.0–80.0)
Platelets: 119 10*3/uL — ABNORMAL LOW (ref 145–400)
RBC: 4.29 10*6/uL (ref 4.20–5.70)
RDW: 15.3 % (ref 11.1–15.7)
WBC: 3.7 10*3/uL — ABNORMAL LOW (ref 4.0–10.0)

## 2014-04-20 LAB — CMP (CANCER CENTER ONLY)
ALBUMIN: 3.4 g/dL (ref 3.3–5.5)
ALT(SGPT): 35 U/L (ref 10–47)
AST: 37 U/L (ref 11–38)
Alkaline Phosphatase: 72 U/L (ref 26–84)
BUN: 12 mg/dL (ref 7–22)
CALCIUM: 9.1 mg/dL (ref 8.0–10.3)
CHLORIDE: 100 meq/L (ref 98–108)
CO2: 25 mEq/L (ref 18–33)
Creat: 0.8 mg/dl (ref 0.6–1.2)
GLUCOSE: 105 mg/dL (ref 73–118)
POTASSIUM: 3.9 meq/L (ref 3.3–4.7)
Sodium: 144 mEq/L (ref 128–145)
Total Bilirubin: 1 mg/dl (ref 0.20–1.60)
Total Protein: 7.4 g/dL (ref 6.4–8.1)

## 2014-04-20 LAB — LACTATE DEHYDROGENASE: LDH: 172 U/L (ref 94–250)

## 2014-04-20 LAB — CEA: CEA: 11.5 ng/mL — ABNORMAL HIGH (ref 0.0–5.0)

## 2014-04-20 MED ORDER — DEXTROSE 5 % IV SOLN
Freq: Once | INTRAVENOUS | Status: AC
  Administered 2014-04-20: 09:00:00 via INTRAVENOUS

## 2014-04-20 MED ORDER — SODIUM CHLORIDE 0.9 % IJ SOLN
10.0000 mL | INTRAMUSCULAR | Status: DC | PRN
  Administered 2014-04-20: 10 mL
  Filled 2014-04-20: qty 10

## 2014-04-20 MED ORDER — ONDANSETRON 8 MG/50ML IVPB (CHCC)
8.0000 mg | Freq: Once | INTRAVENOUS | Status: AC
  Administered 2014-04-20: 8 mg via INTRAVENOUS

## 2014-04-20 MED ORDER — SODIUM CHLORIDE 0.9 % IV SOLN
400.0000 mg | Freq: Once | INTRAVENOUS | Status: AC
  Administered 2014-04-20: 400 mg via INTRAVENOUS
  Filled 2014-04-20: qty 16

## 2014-04-20 MED ORDER — DEXTROSE 5 % IV SOLN
85.0000 mg/m2 | Freq: Once | INTRAVENOUS | Status: AC
  Administered 2014-04-20: 175 mg via INTRAVENOUS
  Filled 2014-04-20: qty 35

## 2014-04-20 MED ORDER — DEXAMETHASONE SODIUM PHOSPHATE 10 MG/ML IJ SOLN
INTRAMUSCULAR | Status: AC
  Filled 2014-04-20: qty 1

## 2014-04-20 MED ORDER — SODIUM CHLORIDE 0.9 % IV SOLN
Freq: Once | INTRAVENOUS | Status: AC
  Administered 2014-04-20: 10:00:00 via INTRAVENOUS

## 2014-04-20 MED ORDER — HEPARIN SOD (PORK) LOCK FLUSH 100 UNIT/ML IV SOLN
500.0000 [IU] | Freq: Once | INTRAVENOUS | Status: AC | PRN
  Administered 2014-04-20: 500 [IU]
  Filled 2014-04-20: qty 5

## 2014-04-20 MED ORDER — DEXAMETHASONE SODIUM PHOSPHATE 10 MG/ML IJ SOLN
10.0000 mg | Freq: Once | INTRAMUSCULAR | Status: AC
  Administered 2014-04-20: 10 mg via INTRAVENOUS

## 2014-04-20 NOTE — Progress Notes (Signed)
[facility]  [phone] [phone]) [phone]  ID: Ryan Maynard OB: 11/26/1950 MR#: [id number] GEX#:[id number] Patient Care Team: Geoffery Lyons, MD as PCP - General (Internal Medicine)  DIAGNOSIS: Metastatic colon cancer  INTERVAL HISTORY: Ryan Maynard is here today for a follow-up. He is responding well to treatment. His CEA in October was down to 11.8.  He had a rash that lasted an hour or so before disappearing but is not sure if it is related to his treatment. He denies fever chills, n/v, cough, headache, dizziness, SOB, chest pain, palpitations, abdominal pain, constipation, diarrhea, blood in urine or stool.   His neuropathy in his hands and feet is still the same. He says that it is tolerable.  No swelling or tenderness in his extremities.  His appetite is good and he is staying hydrated. His weight is stable.  He is still working full time and able to do yard work. Overall, he seems to be doing quite well.   CURRENT TREATMENT: CAPOX w/ Avastin   REVIEW OF SYSTEMS: All other 10 point review of systems is negative.   PAST MEDICAL HISTORY: Past Medical History  Diagnosis Date  . Colon cancer metastasized to liver 06/15/2013    PAST SURGICAL HISTORY: History reviewed. No pertinent past surgical history.  FAMILY HISTORY History reviewed. No pertinent family history.  GYNECOLOGIC HISTORY:  No LMP for male patient.   SOCIAL HISTORY: History   Social History  . Marital Status: Single    Spouse Name: N/A    Number of Children: N/A  . Years of Education: N/A   Occupational History  . Not on file.   Social History Main Topics  . Smoking status: Never Smoker   . Smokeless tobacco: Never Used     Comment: never used tobacco  . Alcohol Use: Not on file  . Drug Use: Not on file  . Sexual Activity: Not on file   Other Topics Concern  . Not on file   Social History Narrative    ADVANCED DIRECTIVES:  <no information>  HEALTH  MAINTENANCE: History  Substance Use Topics  . Smoking status: Never Smoker   . Smokeless tobacco: Never Used     Comment: never used tobacco  . Alcohol Use: Not on file   Colonoscopy: PAP: Bone density: Lipid panel:  No Known Allergies  Current Outpatient Prescriptions  Medication Sig Dispense Refill  . amLODipine-benazepril (LOTREL) 5-20 MG per capsule Take 1 capsule by mouth daily. 90 capsule 2  . capecitabine (XELODA) 500 MG tablet Take 3 tablets (1,500 mg total) by mouth 2 (two) times daily after a meal. Take 3 pills twice a day for 14 days. 84 tablet 4  . Coenzyme Q10 10 MG capsule Take 10 mg by mouth daily.    Marland Kitchen dexamethasone (DECADRON) 4 MG tablet Take 8 mg by mouth 2 (two) times daily with a meal. HR DOES NOT TAKE    . gabapentin (NEURONTIN) 100 MG capsule Take 1 capsule (100 mg total) by mouth 3 (three) times daily. Pt okay to take only once a day 90 capsule 3  . lidocaine-prilocaine (EMLA) cream Apply 1 application topically as needed. Apply quarter sized amount to portacath site 1-2 hours prior to chemotherapy appt.  Cover with saran wrap. 30 g 3  . LORazepam (ATIVAN) 0.5 MG tablet Take 1 tablet (0.5 mg total) by mouth as needed (Nausea or vomiting). 40 tablet 0  . ondansetron (ZOFRAN) 8 MG tablet Take 8 mg by mouth 2 (two)  times daily. Take two times a day starting the day after chemo for 2 days. DOES NOT TAKE     No current facility-administered medications for this visit.   Facility-Administered Medications Ordered in Other Visits  Medication Dose Route Frequency Provider Last Rate Last Dose  . bevacizumab (AVASTIN) 400 mg in sodium chloride 0.9 % 100 mL chemo infusion  400 mg Intravenous Once Volanda Napoleon, MD      . dexamethasone (DECADRON) injection 10 mg  10 mg Intravenous Once Volanda Napoleon, MD      . dextrose 5 % solution   Intravenous Once Volanda Napoleon, MD      . heparin lock flush 100 unit/mL  500 Units Intracatheter Once PRN Volanda Napoleon, MD      .  ondansetron (ZOFRAN) IVPB 8 mg  8 mg Intravenous Once Volanda Napoleon, MD      . oxaliplatin (ELOXATIN) 175 mg in dextrose 5 % 500 mL chemo infusion  85 mg/m2 (Treatment Plan Actual) Intravenous Once Volanda Napoleon, MD      . sodium chloride 0.9 % injection 10 mL  10 mL Intracatheter PRN Volanda Napoleon, MD        OBJECTIVE: Filed Vitals:   04/20/14 0827  BP: 147/85  Pulse: 81  Temp: 98.3 F (36.8 C)  Resp: 16    Filed Weights   04/20/14 0827  Weight: 202 lb (91.627 kg)   ECOG FS:0 - Asymptomatic Ocular: Sclerae unicteric, pupils equal, round and reactive to light Ear-nose-throat: Oropharynx clear, dentition fair Lymphatic: No cervical or supraclavicular adenopathy Lungs no rales or rhonchi, good excursion bilaterally Heart regular rate and rhythm, no murmur appreciated Abd soft, nontender, positive bowel sounds MSK no focal spinal tenderness, no joint edema Neuro: non-focal, well-oriented, appropriate affect  LAB RESULTS: CMP     Component Value Date/Time   NA 144 04/20/2014 0757   NA 136* 06/22/2013 0800   K 3.9 04/20/2014 0757   K 4.4 06/22/2013 0800   CL 100 04/20/2014 0757   CL 97 06/22/2013 0800   CO2 25 04/20/2014 0757   CO2 25 06/22/2013 0800   GLUCOSE 105 04/20/2014 0757   GLUCOSE 89 06/22/2013 0800   BUN 12 04/20/2014 0757   BUN 15 06/22/2013 0800   CREATININE 0.8 04/20/2014 0757   CREATININE 0.68 06/22/2013 0800   CALCIUM 9.1 04/20/2014 0757   CALCIUM 9.5 06/22/2013 0800   PROT 7.4 04/20/2014 0757   AST 37 04/20/2014 0757   ALT 35 04/20/2014 0757   ALKPHOS 72 04/20/2014 0757   BILITOT 1.00 04/20/2014 0757   GFRNONAA >90 06/22/2013 0800   GFRAA >90 06/22/2013 0800   INo results found for: SPEP, UPEP Lab Results  Component Value Date   WBC 3.7* 04/20/2014   NEUTROABS 1.9 04/20/2014   HGB 15.1 04/20/2014   HCT 41.6 04/20/2014   MCV 97 04/20/2014   PLT 119* 04/20/2014   No results found for: LABCA2 No components found for: [id number] No results  for input(s): INR in the last 168 hours. Urinalysis No results found for: COLORURINE, APPEARANCEUR, LABSPEC, PHURINE, GLUCOSEU, HGBUR, BILIRUBINUR, KETONESUR, PROTEINUR, UROBILINOGEN, NITRITE, LEUKOCYTESUR STUDIES: No results found.  ASSESSMENT/PLAN: Ryan Maynard is 63 year old gentleman with metastatic colon cancer. He continues to respond well to treatment. He is asymptomatic at this time.  His CBC and CMP today were ok. We will wait and see what his CEA is for today.  We will proceed with treatment today as planned.  He  will set up an appointment for a PET scan during his next visit.  He has his treatment schedule for December and will see Dr. Marin Olp at his next visit.  He knows to call here with any questions or concerns and to go to the ED in the event of an emergency. We can certainly see him sooner if need be.   Eliezer Bottom, NP 04/20/2014 9:15 AM

## 2014-04-20 NOTE — Patient Instructions (Signed)
[facility] Discharge Instructions for Patients Receiving Chemotherapy  Today you received the following chemotherapy agents:  Oxalitplatin & Avastin.  To help prevent nausea and vomiting after your treatment, we encourage you to take your nausea medication.   If you develop nausea and vomiting that is not controlled by your nausea medication, call the clinic.   BELOW ARE SYMPTOMS THAT SHOULD BE REPORTED IMMEDIATELY:  *FEVER GREATER THAN 100.5 F  *CHILLS WITH OR WITHOUT FEVER  NAUSEA AND VOMITING THAT IS NOT CONTROLLED WITH YOUR NAUSEA MEDICATION  *UNUSUAL SHORTNESS OF BREATH  *UNUSUAL BRUISING OR BLEEDING  TENDERNESS IN MOUTH AND THROAT WITH OR WITHOUT PRESENCE OF ULCERS  *URINARY PROBLEMS  *BOWEL PROBLEMS  UNUSUAL RASH Items with * indicate a potential emergency and should be followed up as soon as possible.  Feel free to call the clinic you have any questions or concerns. The clinic phone number is [phone].

## 2014-04-21 ENCOUNTER — Encounter: Payer: Self-pay | Admitting: *Deleted

## 2014-05-11 ENCOUNTER — Other Ambulatory Visit (HOSPITAL_BASED_OUTPATIENT_CLINIC_OR_DEPARTMENT_OTHER): Payer: BC Managed Care – PPO | Admitting: Lab

## 2014-05-11 ENCOUNTER — Encounter: Payer: Self-pay | Admitting: Hematology & Oncology

## 2014-05-11 ENCOUNTER — Telehealth: Payer: Self-pay | Admitting: Hematology & Oncology

## 2014-05-11 ENCOUNTER — Ambulatory Visit (HOSPITAL_BASED_OUTPATIENT_CLINIC_OR_DEPARTMENT_OTHER): Payer: BC Managed Care – PPO | Admitting: Hematology & Oncology

## 2014-05-11 ENCOUNTER — Ambulatory Visit (HOSPITAL_BASED_OUTPATIENT_CLINIC_OR_DEPARTMENT_OTHER): Payer: BC Managed Care – PPO

## 2014-05-11 VITALS — BP 143/91 | HR 86 | Temp 98.0°F | Resp 18 | Ht 65.0 in | Wt 202.0 lb

## 2014-05-11 DIAGNOSIS — Z5112 Encounter for antineoplastic immunotherapy: Secondary | ICD-10-CM

## 2014-05-11 DIAGNOSIS — C187 Malignant neoplasm of sigmoid colon: Secondary | ICD-10-CM

## 2014-05-11 DIAGNOSIS — C787 Secondary malignant neoplasm of liver and intrahepatic bile duct: Secondary | ICD-10-CM

## 2014-05-11 DIAGNOSIS — C189 Malignant neoplasm of colon, unspecified: Secondary | ICD-10-CM

## 2014-05-11 DIAGNOSIS — L989 Disorder of the skin and subcutaneous tissue, unspecified: Secondary | ICD-10-CM

## 2014-05-11 LAB — CBC WITH DIFFERENTIAL (CANCER CENTER ONLY)
BASO#: 0.1 10*3/uL (ref 0.0–0.2)
BASO%: 2.4 % — ABNORMAL HIGH (ref 0.0–2.0)
EOS ABS: 0.1 10*3/uL (ref 0.0–0.5)
EOS%: 1.9 % (ref 0.0–7.0)
HEMATOCRIT: 43.2 % (ref 38.7–49.9)
HEMOGLOBIN: 15.4 g/dL (ref 13.0–17.1)
LYMPH#: 1 10*3/uL (ref 0.9–3.3)
LYMPH%: 25.7 % (ref 14.0–48.0)
MCH: 34.8 pg — AB (ref 28.0–33.4)
MCHC: 35.6 g/dL (ref 32.0–35.9)
MCV: 98 fL (ref 82–98)
MONO#: 0.6 10*3/uL (ref 0.1–0.9)
MONO%: 17.3 % — ABNORMAL HIGH (ref 0.0–13.0)
NEUT#: 2 10*3/uL (ref 1.5–6.5)
NEUT%: 52.7 % (ref 40.0–80.0)
PLATELETS: 114 10*3/uL — AB (ref 145–400)
RBC: 4.43 10*6/uL (ref 4.20–5.70)
RDW: 15.4 % (ref 11.1–15.7)
WBC: 3.7 10*3/uL — AB (ref 4.0–10.0)

## 2014-05-11 LAB — CMP (CANCER CENTER ONLY)
ALT: 44 U/L (ref 10–47)
AST: 44 U/L — ABNORMAL HIGH (ref 11–38)
Albumin: 3.4 g/dL (ref 3.3–5.5)
Alkaline Phosphatase: 77 U/L (ref 26–84)
BUN: 13 mg/dL (ref 7–22)
CALCIUM: 9.2 mg/dL (ref 8.0–10.3)
CO2: 25 meq/L (ref 18–33)
CREATININE: 1 mg/dL (ref 0.6–1.2)
Chloride: 104 mEq/L (ref 98–108)
Glucose, Bld: 117 mg/dL (ref 73–118)
POTASSIUM: 3.7 meq/L (ref 3.3–4.7)
Sodium: 142 mEq/L (ref 128–145)
Total Bilirubin: 1 mg/dl (ref 0.20–1.60)
Total Protein: 7.5 g/dL (ref 6.4–8.1)

## 2014-05-11 LAB — UA PROTEIN, DIPSTICK - CHCC SATELLITE: PROTEIN, URINE: NEGATIVE mg/dL

## 2014-05-11 MED ORDER — HEPARIN SOD (PORK) LOCK FLUSH 100 UNIT/ML IV SOLN
500.0000 [IU] | Freq: Once | INTRAVENOUS | Status: AC | PRN
  Administered 2014-05-11: 500 [IU]
  Filled 2014-05-11: qty 5

## 2014-05-11 MED ORDER — SULFAMETHOXAZOLE-TRIMETHOPRIM 800-160 MG PO TABS: 1.0000 | ORAL_TABLET | Freq: Two times a day (BID) | ORAL | Status: DC

## 2014-05-11 MED ORDER — LORAZEPAM 0.5 MG PO TABS: 0.5000 mg | ORAL_TABLET | ORAL | Status: DC | PRN

## 2014-05-11 MED ORDER — SODIUM CHLORIDE 0.9 % IV SOLN
400.0000 mg | Freq: Once | INTRAVENOUS | Status: AC
  Administered 2014-05-11: 400 mg via INTRAVENOUS
  Filled 2014-05-11: qty 16

## 2014-05-11 MED ORDER — SODIUM CHLORIDE 0.9 % IV SOLN
Freq: Once | INTRAVENOUS | Status: AC
  Administered 2014-05-11: 09:00:00 via INTRAVENOUS

## 2014-05-11 MED ORDER — DEXAMETHASONE SODIUM PHOSPHATE 10 MG/ML IJ SOLN
INTRAMUSCULAR | Status: AC
  Filled 2014-05-11: qty 1

## 2014-05-11 MED ORDER — SODIUM CHLORIDE 0.9 % IJ SOLN
10.0000 mL | INTRAMUSCULAR | Status: DC | PRN
  Administered 2014-05-11: 10 mL
  Filled 2014-05-11: qty 10

## 2014-05-11 NOTE — Patient Instructions (Signed)
Bevacizumab injection  What is this medicine?  BEVACIZUMAB (be va SIZ yoo mab) is a chemotherapy drug. It targets a protein found in many cancer cell types, and halts cancer growth. This drug treats many cancers including non-small cell lung cancer, ovarian cancer, cervical cancer, and colon or rectal cancer. It is usually given with other chemotherapy drugs.  This medicine may be used for other purposes; ask your health care provider or pharmacist if you have questions.  COMMON BRAND NAME(S): Avastin  What should I tell my health care provider before I take this medicine?  They need to know if you have any of these conditions:  -blood clots  -heart disease, including heart failure, heart attack, or chest pain (angina)  -high blood pressure  -infection (especially a virus infection such as chickenpox, cold sores, or herpes)  -kidney disease  -lung disease  -prior chemotherapy with doxorubicin, daunorubicin, epirubicin, or other anthracycline type chemotherapy agents  -recent or ongoing radiation therapy  -recent surgery  -stroke  -an unusual or allergic reaction to bevacizumab, hamster proteins, mouse proteins, other medicines, foods, dyes, or preservatives  -pregnant or trying to get pregnant  -breast-feeding  How should I use this medicine?  This medicine is for infusion into a vein. It is given by a health care professional in a hospital or clinic setting.  Talk to your pediatrician regarding the use of this medicine in children. Special care may be needed.  Overdosage: If you think you have taken too much of this medicine contact a poison control center or emergency room at once.  NOTE: This medicine is only for you. Do not share this medicine with others.  What if I miss a dose?  It is important not to miss your dose. Call your doctor or health care professional if you are unable to keep an appointment.  What may interact with this medicine?  Interactions are not expected.  This list may not describe all  possible interactions. Give your health care provider a list of all the medicines, herbs, non-prescription drugs, or dietary supplements you use. Also tell them if you smoke, drink alcohol, or use illegal drugs. Some items may interact with your medicine.  What should I watch for while using this medicine?  Your condition will be monitored carefully while you are receiving this medicine. You will need important blood work and urine testing done while you are taking this medicine.  During your treatment, let your health care professional know if you have any unusual symptoms, such as difficulty breathing.  This medicine may rarely cause 'gastrointestinal perforation' (holes in the stomach, intestines or colon), a serious side effect requiring surgery to repair.  This medicine should be started at least 28 days following major surgery and the site of the surgery should be totally healed. Check with your doctor before scheduling dental work or surgery while you are receiving this treatment. Talk to your doctor if you have recently had surgery or if you have a wound that has not healed.  Do not become pregnant while taking this medicine. Women should inform their doctor if they wish to become pregnant or think they might be pregnant. There is a potential for serious side effects to an unborn child. Talk to your health care professional or pharmacist for more information. Do not breast-feed an infant while taking this medicine.  This medicine has caused ovarian failure in some women. This medicine may interfere with the ability to have a child. You should talk to   your doctor or health care professional if you are concerned about your fertility.  What side effects may I notice from receiving this medicine?  Side effects that you should report to your doctor or health care professional as soon as possible:  -allergic reactions like skin rash, itching or hives, swelling of the face, lips, or tongue  -signs of infection -  fever or chills, cough, sore throat, pain or trouble passing urine  -signs of decreased platelets or bleeding - bruising, pinpoint red spots on the skin, black, tarry stools, nosebleeds, blood in the urine  -breathing problems  -changes in vision  -chest pain  -confusion  -jaw pain, especially after dental work  -mouth sores  -seizures  -severe abdominal pain  -severe headache  -sudden numbness or weakness of the face, arm or leg  -swelling of legs or ankles  -symptoms of a stroke: change in mental awareness, inability to talk or move one side of the body (especially in patients with lung cancer)  -trouble passing urine or change in the amount of urine  -trouble speaking or understanding  -trouble walking, dizziness, loss of balance or coordination  Side effects that usually do not require medical attention (report to your doctor or health care professional if they continue or are bothersome):  -constipation  -diarrhea  -dry skin  -headache  -loss of appetite  -nausea, vomiting  This list may not describe all possible side effects. Call your doctor for medical advice about side effects. You may report side effects to FDA at 1-800-FDA-1088.  Where should I keep my medicine?  This drug is given in a hospital or clinic and will not be stored at home.  NOTE: This sheet is a summary. It may not cover all possible information. If you have questions about this medicine, talk to your doctor, pharmacist, or health care provider.   2015, [employer]vier/Gold Standard. (2013-04-20 11:38:34)
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
## 2014-05-11 NOTE — Telephone Encounter (Addendum)
J1100 dexamethasone 10 MG/ML Soln NPR BCBS      J2405 ondansetron 8 MG/50ML Soln NPR BCBS      J7050 sodium chloride 0.9 % Soln NPR BCBS      J9035 bevacizumab 400 MG/16ML Soln 16 mL Vial NPR BCBS      J9263 oxaliplatin 100 MG/20ML Soln 20 mL Vial NPR BCBS      J9263 oxaliplatin 100 MG/20ML Soln 20 mL Vial NPR BCBS      J1642 heparin lock flush 100 UNIT/ML Soln NPR BCBS

## 2014-05-12 LAB — LACTATE DEHYDROGENASE: LDH: 188 U/L (ref 94–250)

## 2014-05-12 LAB — CEA: CEA: 9.8 ng/mL — ABNORMAL HIGH (ref 0.0–5.0)

## 2014-06-01 ENCOUNTER — Ambulatory Visit (HOSPITAL_COMMUNITY)
Admission: RE | Admit: 2014-06-01 | Discharge: 2014-06-01 | Disposition: A | Discharge status: Home / Self Care | Payer: BLUE CROSS/BLUE SHIELD | Source: Ambulatory Visit | Attending: Hematology & Oncology | Admitting: Hematology & Oncology

## 2014-06-01 DIAGNOSIS — C189 Malignant neoplasm of colon, unspecified: Secondary | ICD-10-CM | POA: Diagnosis not present

## 2014-06-01 DIAGNOSIS — R911 Solitary pulmonary nodule: Secondary | ICD-10-CM | POA: Insufficient documentation

## 2014-06-01 DIAGNOSIS — C787 Secondary malignant neoplasm of liver and intrahepatic bile duct: Secondary | ICD-10-CM | POA: Diagnosis present

## 2014-06-01 LAB — GLUCOSE, CAPILLARY: GLUCOSE-CAPILLARY: 103 mg/dL — AB (ref 70–99)

## 2014-06-01 MED ORDER — FLUDEOXYGLUCOSE F - 18 (FDG) INJECTION
10.6000 | Freq: Once | INTRAVENOUS | Status: AC | PRN
  Administered 2014-06-01: 10.6 via INTRAVENOUS

## 2014-06-03 DIAGNOSIS — Z9221 Personal history of antineoplastic chemotherapy: Secondary | ICD-10-CM

## 2014-06-03 HISTORY — DX: Personal history of antineoplastic chemotherapy: Z92.21

## 2014-06-06 ENCOUNTER — Telehealth: Payer: Self-pay | Admitting: *Deleted

## 2014-06-06 NOTE — Telephone Encounter (Signed)
Patient said that yesterday he experienced left sided facial swelling including his lips. He stated that he had difficulty speaking, but there was no airway compromise. Today he says the swelling is much better. He reported no new meds or foods. He did have a PET scan last Wednesday. Spoke with Dr Marin Olp and told patient we did not need to see him since the swelling was improving, but that if it worsened again, to call back. He was also told that if at anytime his airway was affected to call 911. He has a pre-scheduled appointment for this Wednesday.

## 2014-06-08 ENCOUNTER — Encounter: Payer: Self-pay | Admitting: Hematology & Oncology

## 2014-06-08 ENCOUNTER — Ambulatory Visit (HOSPITAL_BASED_OUTPATIENT_CLINIC_OR_DEPARTMENT_OTHER): Payer: BLUE CROSS/BLUE SHIELD

## 2014-06-08 ENCOUNTER — Telehealth: Payer: Self-pay | Admitting: Hematology & Oncology

## 2014-06-08 ENCOUNTER — Other Ambulatory Visit (HOSPITAL_BASED_OUTPATIENT_CLINIC_OR_DEPARTMENT_OTHER): Payer: BLUE CROSS/BLUE SHIELD | Admitting: Lab

## 2014-06-08 ENCOUNTER — Ambulatory Visit (HOSPITAL_BASED_OUTPATIENT_CLINIC_OR_DEPARTMENT_OTHER): Payer: BLUE CROSS/BLUE SHIELD | Admitting: Hematology & Oncology

## 2014-06-08 VITALS — BP 147/93 | HR 87 | Temp 97.9°F | Resp 20 | Ht 65.0 in | Wt 202.0 lb

## 2014-06-08 DIAGNOSIS — Z5112 Encounter for antineoplastic immunotherapy: Secondary | ICD-10-CM

## 2014-06-08 DIAGNOSIS — C189 Malignant neoplasm of colon, unspecified: Secondary | ICD-10-CM

## 2014-06-08 DIAGNOSIS — C787 Secondary malignant neoplasm of liver and intrahepatic bile duct: Secondary | ICD-10-CM

## 2014-06-08 DIAGNOSIS — C187 Malignant neoplasm of sigmoid colon: Secondary | ICD-10-CM

## 2014-06-08 LAB — CBC WITH DIFFERENTIAL (CANCER CENTER ONLY)
BASO#: 0 10*3/uL (ref 0.0–0.2)
BASO%: 0.9 % (ref 0.0–2.0)
EOS%: 2.9 % (ref 0.0–7.0)
Eosinophils Absolute: 0.1 10*3/uL (ref 0.0–0.5)
HCT: 46 % (ref 38.7–49.9)
HGB: 16.2 g/dL (ref 13.0–17.1)
LYMPH#: 1 10*3/uL (ref 0.9–3.3)
LYMPH%: 22 % (ref 14.0–48.0)
MCH: 34.3 pg — ABNORMAL HIGH (ref 28.0–33.4)
MCHC: 35.2 g/dL (ref 32.0–35.9)
MCV: 98 fL (ref 82–98)
MONO#: 0.6 10*3/uL (ref 0.1–0.9)
MONO%: 14.1 % — ABNORMAL HIGH (ref 0.0–13.0)
NEUT%: 60.1 % (ref 40.0–80.0)
NEUTROS ABS: 2.7 10*3/uL (ref 1.5–6.5)
Platelets: 127 10*3/uL — ABNORMAL LOW (ref 145–400)
RBC: 4.72 10*6/uL (ref 4.20–5.70)
RDW: 14.8 % (ref 11.1–15.7)
WBC: 4.4 10*3/uL (ref 4.0–10.0)

## 2014-06-08 LAB — CMP (CANCER CENTER ONLY)
ALT: 35 U/L (ref 10–47)
AST: 39 U/L — ABNORMAL HIGH (ref 11–38)
Albumin: 3.7 g/dL (ref 3.3–5.5)
Alkaline Phosphatase: 91 U/L — ABNORMAL HIGH (ref 26–84)
BUN, Bld: 12 mg/dL (ref 7–22)
CO2: 26 mEq/L (ref 18–33)
Calcium: 9.2 mg/dL (ref 8.0–10.3)
Chloride: 103 mEq/L (ref 98–108)
Creat: 0.9 mg/dl (ref 0.6–1.2)
GLUCOSE: 98 mg/dL (ref 73–118)
Potassium: 3.9 mEq/L (ref 3.3–4.7)
SODIUM: 140 meq/L (ref 128–145)
TOTAL PROTEIN: 8 g/dL (ref 6.4–8.1)
Total Bilirubin: 1.1 mg/dl (ref 0.20–1.60)

## 2014-06-08 LAB — LACTATE DEHYDROGENASE: LDH: 175 U/L (ref 94–250)

## 2014-06-08 LAB — CEA: CEA: 9.2 ng/mL — AB (ref 0.0–5.0)

## 2014-06-08 MED ORDER — SODIUM CHLORIDE 0.9 % IV SOLN
400.0000 mg | Freq: Once | INTRAVENOUS | Status: AC
  Administered 2014-06-08: 400 mg via INTRAVENOUS
  Filled 2014-06-08: qty 16

## 2014-06-08 MED ORDER — HEPARIN SOD (PORK) LOCK FLUSH 100 UNIT/ML IV SOLN
500.0000 [IU] | Freq: Once | INTRAVENOUS | Status: AC | PRN
  Administered 2014-06-08: 500 [IU]
  Filled 2014-06-08: qty 5

## 2014-06-08 MED ORDER — SODIUM CHLORIDE 0.9 % IV SOLN
Freq: Once | INTRAVENOUS | Status: AC
  Administered 2014-06-08: 11:00:00 via INTRAVENOUS

## 2014-06-08 MED ORDER — SODIUM CHLORIDE 0.9 % IJ SOLN
10.0000 mL | INTRAMUSCULAR | Status: DC | PRN
  Administered 2014-06-08: 10 mL
  Filled 2014-06-08: qty 10

## 2014-06-08 NOTE — Telephone Encounter (Signed)
O8875 PR OXALIPLATIN   J9190 PR FLUOROURACIL INJECTION   J0640 PR LEUCOVORIN CALCIUM INJECTION   J2405 PR ONDANSETRON HCL INJECTION   J9035 PR BEVACIZUMAB INJECTION   APPROVED w [place]: [id number] VALID: 06/08/2014 - 06/08/2015

## 2014-06-08 NOTE — Patient Instructions (Signed)
Bevacizumab injection  What is this medicine?  BEVACIZUMAB (be va SIZ yoo mab) is a chemotherapy drug. It targets a protein found in many cancer cell types, and halts cancer growth. This drug treats many cancers including non-small cell lung cancer, ovarian cancer, cervical cancer, and colon or rectal cancer. It is usually given with other chemotherapy drugs.  This medicine may be used for other purposes; ask your health care provider or pharmacist if you have questions.  COMMON BRAND NAME(S): Avastin  What should I tell my health care provider before I take this medicine?  They need to know if you have any of these conditions:  -blood clots  -heart disease, including heart failure, heart attack, or chest pain (angina)  -high blood pressure  -infection (especially a virus infection such as chickenpox, cold sores, or herpes)  -kidney disease  -lung disease  -prior chemotherapy with doxorubicin, daunorubicin, epirubicin, or other anthracycline type chemotherapy agents  -recent or ongoing radiation therapy  -recent surgery  -stroke  -an unusual or allergic reaction to bevacizumab, hamster proteins, mouse proteins, other medicines, foods, dyes, or preservatives  -pregnant or trying to get pregnant  -breast-feeding  How should I use this medicine?  This medicine is for infusion into a vein. It is given by a health care professional in a hospital or clinic setting.  Talk to your pediatrician regarding the use of this medicine in children. Special care may be needed.  Overdosage: If you think you have taken too much of this medicine contact a poison control center or emergency room at once.  NOTE: This medicine is only for you. Do not share this medicine with others.  What if I miss a dose?  It is important not to miss your dose. Call your doctor or health care professional if you are unable to keep an appointment.  What may interact with this medicine?  Interactions are not expected.  This list may not describe all  possible interactions. Give your health care provider a list of all the medicines, herbs, non-prescription drugs, or dietary supplements you use. Also tell them if you smoke, drink alcohol, or use illegal drugs. Some items may interact with your medicine.  What should I watch for while using this medicine?  Your condition will be monitored carefully while you are receiving this medicine. You will need important blood work and urine testing done while you are taking this medicine.  During your treatment, let your health care professional know if you have any unusual symptoms, such as difficulty breathing.  This medicine may rarely cause 'gastrointestinal perforation' (holes in the stomach, intestines or colon), a serious side effect requiring surgery to repair.  This medicine should be started at least 28 days following major surgery and the site of the surgery should be totally healed. Check with your doctor before scheduling dental work or surgery while you are receiving this treatment. Talk to your doctor if you have recently had surgery or if you have a wound that has not healed.  Do not become pregnant while taking this medicine. Women should inform their doctor if they wish to become pregnant or think they might be pregnant. There is a potential for serious side effects to an unborn child. Talk to your health care professional or pharmacist for more information. Do not breast-feed an infant while taking this medicine.  This medicine has caused ovarian failure in some women. This medicine may interfere with the ability to have a child. You should talk to   your doctor or health care professional if you are concerned about your fertility.  What side effects may I notice from receiving this medicine?  Side effects that you should report to your doctor or health care professional as soon as possible:  -allergic reactions like skin rash, itching or hives, swelling of the face, lips, or tongue  -signs of infection -  fever or chills, cough, sore throat, pain or trouble passing urine  -signs of decreased platelets or bleeding - bruising, pinpoint red spots on the skin, black, tarry stools, nosebleeds, blood in the urine  -breathing problems  -changes in vision  -chest pain  -confusion  -jaw pain, especially after dental work  -mouth sores  -seizures  -severe abdominal pain  -severe headache  -sudden numbness or weakness of the face, arm or leg  -swelling of legs or ankles  -symptoms of a stroke: change in mental awareness, inability to talk or move one side of the body (especially in patients with lung cancer)  -trouble passing urine or change in the amount of urine  -trouble speaking or understanding  -trouble walking, dizziness, loss of balance or coordination  Side effects that usually do not require medical attention (report to your doctor or health care professional if they continue or are bothersome):  -constipation  -diarrhea  -dry skin  -headache  -loss of appetite  -nausea, vomiting  This list may not describe all possible side effects. Call your doctor for medical advice about side effects. You may report side effects to FDA at 1-800-FDA-1088.  Where should I keep my medicine?  This drug is given in a hospital or clinic and will not be stored at home.  NOTE: This sheet is a summary. It may not cover all possible information. If you have questions about this medicine, talk to your doctor, pharmacist, or health care provider.   2015, [employer]vier/Gold Standard. (2013-04-20 11:38:34)

## 2014-06-09 ENCOUNTER — Encounter: Payer: Self-pay | Admitting: *Deleted
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
## 2014-06-10 ENCOUNTER — Telehealth: Payer: Self-pay | Admitting: Hematology & Oncology

## 2014-06-10 NOTE — Telephone Encounter (Signed)
Left pt message all appointments cx except 2-3. Next appointment day approved by MD

## 2014-06-10 NOTE — Telephone Encounter (Signed)
[employer] has APPROVED the 5-FLUOROURACIL, BEVACIZUMAB, LEUCOVORIN, LEVOLEUCOVORIN, OXALIPLATIN.    AUTH: [id number]      COPY SCANNED

## 2014-06-13 ENCOUNTER — Other Ambulatory Visit: Payer: Self-pay | Admitting: *Deleted

## 2014-06-13 DIAGNOSIS — C189 Malignant neoplasm of colon, unspecified: Secondary | ICD-10-CM

## 2014-06-13 DIAGNOSIS — C787 Secondary malignant neoplasm of liver and intrahepatic bile duct: Principal | ICD-10-CM

## 2014-06-13 MED ORDER — CAPECITABINE 500 MG PO TABS: 1500.0000 mg | ORAL_TABLET | Freq: Two times a day (BID) | ORAL | Status: DC

## 2014-06-22 ENCOUNTER — Other Ambulatory Visit: Payer: BC Managed Care – PPO | Admitting: Lab

## 2014-06-22 ENCOUNTER — Ambulatory Visit: Payer: BC Managed Care – PPO

## 2014-06-22 ENCOUNTER — Ambulatory Visit: Payer: BC Managed Care – PPO | Admitting: Family

## 2014-07-06 ENCOUNTER — Ambulatory Visit: Payer: BC Managed Care – PPO

## 2014-07-06 ENCOUNTER — Encounter: Payer: Self-pay | Admitting: Hematology & Oncology

## 2014-07-06 ENCOUNTER — Ambulatory Visit (HOSPITAL_BASED_OUTPATIENT_CLINIC_OR_DEPARTMENT_OTHER): Payer: BLUE CROSS/BLUE SHIELD | Admitting: Hematology & Oncology

## 2014-07-06 ENCOUNTER — Other Ambulatory Visit (HOSPITAL_BASED_OUTPATIENT_CLINIC_OR_DEPARTMENT_OTHER): Payer: BLUE CROSS/BLUE SHIELD | Admitting: Lab

## 2014-07-06 VITALS — BP 161/90 | HR 80 | Temp 97.9°F | Resp 18 | Ht 65.0 in | Wt 202.0 lb

## 2014-07-06 DIAGNOSIS — C187 Malignant neoplasm of sigmoid colon: Secondary | ICD-10-CM

## 2014-07-06 DIAGNOSIS — C787 Secondary malignant neoplasm of liver and intrahepatic bile duct: Secondary | ICD-10-CM

## 2014-07-06 DIAGNOSIS — C189 Malignant neoplasm of colon, unspecified: Secondary | ICD-10-CM

## 2014-07-06 LAB — CBC WITH DIFFERENTIAL (CANCER CENTER ONLY)
BASO#: 0.1 10*3/uL (ref 0.0–0.2)
BASO%: 1.8 % (ref 0.0–2.0)
EOS%: 2 % (ref 0.0–7.0)
Eosinophils Absolute: 0.1 10*3/uL (ref 0.0–0.5)
HCT: 45.7 % (ref 38.7–49.9)
HEMOGLOBIN: 16 g/dL (ref 13.0–17.1)
LYMPH#: 1 10*3/uL (ref 0.9–3.3)
LYMPH%: 22.6 % (ref 14.0–48.0)
MCH: 33.8 pg — ABNORMAL HIGH (ref 28.0–33.4)
MCHC: 35 g/dL (ref 32.0–35.9)
MCV: 96 fL (ref 82–98)
MONO#: 0.6 10*3/uL (ref 0.1–0.9)
MONO%: 12.9 % (ref 0.0–13.0)
NEUT#: 2.7 10*3/uL (ref 1.5–6.5)
NEUT%: 60.7 % (ref 40.0–80.0)
PLATELETS: 117 10*3/uL — AB (ref 145–400)
RBC: 4.74 10*6/uL (ref 4.20–5.70)
RDW: 15 % (ref 11.1–15.7)
WBC: 4.4 10*3/uL (ref 4.0–10.0)

## 2014-07-06 LAB — CMP (CANCER CENTER ONLY)
ALBUMIN: 3.7 g/dL (ref 3.3–5.5)
ALT(SGPT): 36 U/L (ref 10–47)
AST: 40 U/L — ABNORMAL HIGH (ref 11–38)
Alkaline Phosphatase: 75 U/L (ref 26–84)
BILIRUBIN TOTAL: 1.1 mg/dL (ref 0.20–1.60)
BUN: 12 mg/dL (ref 7–22)
CHLORIDE: 101 meq/L (ref 98–108)
CO2: 27 meq/L (ref 18–33)
Calcium: 9.1 mg/dL (ref 8.0–10.3)
Creat: 0.7 mg/dl (ref 0.6–1.2)
Glucose, Bld: 104 mg/dL (ref 73–118)
Potassium: 4.2 mEq/L (ref 3.3–4.7)
SODIUM: 141 meq/L (ref 128–145)
Total Protein: 7.5 g/dL (ref 6.4–8.1)

## 2014-07-06 LAB — LACTATE DEHYDROGENASE: LDH: 186 U/L (ref 94–250)

## 2014-07-06 LAB — CEA: CEA: 10.2 ng/mL — ABNORMAL HIGH (ref 0.0–5.0)

## 2014-07-07 ENCOUNTER — Telehealth: Payer: Self-pay | Admitting: *Deleted

## 2014-07-07 NOTE — Telephone Encounter (Addendum)
Patient aware from [place]  ----- Message from Volanda Napoleon, MD sent at 07/06/2014  6:19 PM EST ----- Call - CEA is holding stable at 10.2. pete
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
## 2014-07-14 ENCOUNTER — Inpatient Hospital Stay
Admission: RE | Admit: 2014-07-14 | Discharge: 2014-07-14 | Disposition: A | Discharge status: Home / Self Care | Payer: Self-pay | Source: Ambulatory Visit | Attending: Diagnostic Radiology | Admitting: Diagnostic Radiology

## 2014-07-14 ENCOUNTER — Ambulatory Visit
Admission: RE | Admit: 2014-07-14 | Discharge: 2014-07-14 | Disposition: A | Discharge status: Home / Self Care | Payer: BLUE CROSS/BLUE SHIELD | Source: Ambulatory Visit | Attending: Hematology & Oncology | Admitting: Hematology & Oncology

## 2014-07-14 ENCOUNTER — Other Ambulatory Visit (HOSPITAL_COMMUNITY): Payer: Self-pay | Admitting: Diagnostic Radiology

## 2014-07-14 DIAGNOSIS — C189 Malignant neoplasm of colon, unspecified: Secondary | ICD-10-CM

## 2014-07-14 DIAGNOSIS — B009 Herpesviral infection, unspecified: Secondary | ICD-10-CM

## 2014-07-14 DIAGNOSIS — K769 Liver disease, unspecified: Secondary | ICD-10-CM

## 2014-07-14 DIAGNOSIS — C787 Secondary malignant neoplasm of liver and intrahepatic bile duct: Principal | ICD-10-CM

## 2014-07-14 HISTORY — PX: IR GENERIC HISTORICAL: IMG1180011

## 2014-07-14 NOTE — Consult Note (Signed)
Chief Complaint: Chief Complaint  Patient presents with  . Advice Only    Liver metastasis    Referring Physician(s): Ennever,Peter R  History of Present Illness: Ryan Maynard is a 64 y.o. male with known metastatic colon cancer to the liver. The patient has been treated with chemotherapy over the last year. Overall, the patient has responded very well to the chemotherapy. His CEA level has decreased from 3455 to 10.2. He is essentially asymptomatic. He has gained approximately 50 pounds since his cancer diagnosis. The patient had a biopsy of the colon. Patient had a recent PET/CT which demonstrated multiple calcified lesions in the liver which are presumed to be treated lesions. There were only 2 areas within the liver which were suspicious for active or hypermetabolic liver lesions. Interventional radiology has been asked to evaluate the patient for possible liver directed therapy. The patient had Avastin therapy on 06/09/2014 and currently receiving Xeloda therapy.  The patient is still working on a full-time basis. He has no significant lifestyle limitations at this time.   Past Medical History  Diagnosis Date  . Colon cancer metastasized to liver 06/15/2013    No past surgical history on file.  Allergies: Lyrica  Medications: Prior to Admission medications   Medication Sig Start Date End Date Taking? Authorizing Provider  amLODipine-benazepril (LOTREL) 5-20 MG per capsule Take 1 capsule by mouth daily. 12/22/13  Yes Volanda Napoleon, MD  capecitabine (XELODA) 500 MG tablet Take 3 tablets (1,500 mg total) by mouth 2 (two) times daily after a meal. Take for 14 days, off 7 days. 06/13/14  Yes Volanda Napoleon, MD  Coenzyme Q10 10 MG capsule Take 10 mg by mouth daily.   Yes Historical Provider, MD  gabapentin (NEURONTIN) 100 MG capsule Take 1 capsule (100 mg total) by mouth 3 (three) times daily. Pt okay to take only once a day 03/02/14  Yes Volanda Napoleon, MD  lidocaine-prilocaine  (EMLA) cream Apply 1 application topically as needed. Apply quarter sized amount to portacath site 1-2 hours prior to chemotherapy appt.  Cover with saran wrap. 06/23/13  Yes Volanda Napoleon, MD  dexamethasone (DECADRON) 4 MG tablet Take 8 mg by mouth 2 (two) times daily with a meal. HR DOES NOT TAKE 06/21/13   Volanda Napoleon, MD  LORazepam (ATIVAN) 0.5 MG tablet Take 1 tablet (0.5 mg total) by mouth as needed (Nausea or vomiting). Patient not taking: Reported on 07/14/2014 05/11/14   Volanda Napoleon, MD  ondansetron (ZOFRAN) 8 MG tablet Take 8 mg by mouth 2 (two) times daily. Take two times a day starting the day after chemo for 2 days. DOES NOT TAKE 06/21/13   Volanda Napoleon, MD  sulfamethoxazole-trimethoprim (BACTRIM DS,SEPTRA DS) 800-160 MG per tablet Take 1 tablet by mouth 2 (two) times daily. Patient not taking: Reported on 07/14/2014 05/11/14   Volanda Napoleon, MD    No family history on file.  History   Social History  . Marital Status: Single    Spouse Name: N/A  . Number of Children: N/A  . Years of Education: N/A   Social History Main Topics  . Smoking status: Never Smoker   . Smokeless tobacco: Never Used     Comment: never used tobacco  . Alcohol Use: Not on file  . Drug Use: Not on file  . Sexual Activity: Not on file   Other Topics Concern  . Not on file   Social History Narrative  ECOG Status: ECOG 0-1  Review of Systems: A 12 point ROS discussed and pertinent positives are indicated in the HPI above.  All other systems are negative.  Review of Systems  Constitutional: Negative.   HENT:       Chronic sinus problems  Eyes: Negative.   Respiratory: Negative.   Cardiovascular: Negative.   Gastrointestinal: Negative.   Genitourinary: Negative.   Neurological: Negative.     Vital Signs: BP 177/98 mmHg  Pulse 84  Temp(Src) 97.6 F (36.4 C) (Oral)  Resp 14  Ht 5\' 5"  (1.651 m)  Wt 202 lb (91.627 kg)  BMI 33.61 kg/m2  Physical Exam  Constitutional: He  appears well-developed and well-nourished.  Cardiovascular: Normal rate, regular rhythm, normal heart sounds and intact distal pulses.   Pulmonary/Chest: Effort normal and breath sounds normal.  Abdominal: Soft. Bowel sounds are normal.    Mallampati Score:   One  Imaging: Most of the patient's imaging was done outside of the [facility] network and not on [facility]. I personally reviewed a PET/CT from 06/01/2014. Patient has multiple calcified lesions throughout the liver which could represent treated disease. There are 2 hypermetabolic areas in the right hepatic lobe. The most obvious or definitive lesion measures roughly 2 cm on the periphery of the right hepatic lobe. The other area is poorly defined. No evidence for extrahepatic disease.  Labs: _ CBC:  Recent Labs  04/20/14 0757 05/11/14 0755 06/08/14 0920 07/06/14 0939  WBC 3.7* 3.7* 4.4 4.4  HGB 15.1 15.4 16.2 16.0  HCT 41.6 43.2 46.0 45.7  PLT 119* 114* 127* 117*    COAGS: No results for input(s): INR, APTT in the last 8760 hours.  BMP:  Recent Labs  04/20/14 0757 05/11/14 0755 06/08/14 0920 07/06/14 0940  NA 144 142 140 141  K 3.9 3.7 3.9 4.2  CL 100 104 103 101  CO2 25 25 26 27   GLUCOSE 105 117 98 104  BUN 12 13 12 12   CALCIUM 9.1 9.2 9.2 9.1  CREATININE 0.8 1.0 0.9 0.7    LIVER FUNCTION TESTS:  Recent Labs  04/20/14 0757 05/11/14 0755 06/08/14 0920 07/06/14 0940  BILITOT 1.00 1.00 1.10 1.10  AST 37 44* 39* 40*  ALT 35 44 35 36  ALKPHOS 72 77 91* 75  PROT 7.4 7.5 8.0 7.5    TUMOR MARKERS:  Recent Labs  04/20/14 0757 05/11/14 0755 06/08/14 0920 07/06/14 0940  CEA 11.5* 9.8* 9.2* 10.2*    Assessment and Plan:  Metastatic colon cancer to the liver: The patient has responded very well to chemotherapy. Recent PET/CT demonstrates that many of the lesions are calcified and not hypermetabolic. There are at least 2 lesions in the right hepatic lobe which are suspicious for active metastatic  disease. One lesion is very convincing and measures roughly 2 cm on the CT. The other lesion is poorly defined. Patient has no evidence for extrahepatic disease. His CEA level has markedly decreased since therapy.  I discussed regional liver therapies with the patient in depth. We discussed chemoembolization, bland embolization, radioembolization and ablation. We spent most of the time discussing the Y 90 radioembolization procedure. I explained the risks and benefits of this procedure in depth. The patient has a very good understanding of this procedure. The patient understands that his metastatic colon disease is not curable and these liver treatments are palliative.  At this time, the patient's metastatic disease burden in the liver appears to be small. There is concern for possibly 2  lesions in the right hepatic lobe. I would like to further evaluate the patient's liver disease with an MRI. If a liver MRI confirms are only 1 or 2 suspicious lesions, I would consider image guided thermal ablation of these small lesions. If the MRI demonstrates additional foci of disease, I think we should consider Y 90 radioembolization. I have discussed these options with Dr. Jonette Eva.  Plan for a liver MRI with and without contrast. We are also in the process of getting the patient's other imaging for comparison. I will contact the patient and discuss my treatment recommendations following the MRI.  Thank you for this interesting consult.  I greatly enjoyed meeting Ryan Maynard and look forward to participating in their care.  SignedCarylon Perches 07/14/2014, 1:43 PM   I spent a total of 30 minutes in face to face in clinical consultation, greater than 50% of which was counseling/coordinating care for metastatic colon cancer.

## 2014-07-15 ENCOUNTER — Other Ambulatory Visit (HOSPITAL_COMMUNITY): Payer: Self-pay | Admitting: Diagnostic Radiology

## 2014-07-15 ENCOUNTER — Inpatient Hospital Stay
Admission: RE | Admit: 2014-07-15 | Discharge: 2014-07-15 | Disposition: A | Discharge status: Home / Self Care | Payer: Self-pay | Source: Ambulatory Visit | Attending: Diagnostic Radiology | Admitting: Diagnostic Radiology

## 2014-07-15 DIAGNOSIS — C189 Malignant neoplasm of colon, unspecified: Secondary | ICD-10-CM

## 2014-07-15 DIAGNOSIS — K769 Liver disease, unspecified: Secondary | ICD-10-CM

## 2014-07-15 DIAGNOSIS — C787 Secondary malignant neoplasm of liver and intrahepatic bile duct: Principal | ICD-10-CM

## 2014-07-20 ENCOUNTER — Ambulatory Visit (HOSPITAL_COMMUNITY): Payer: BLUE CROSS/BLUE SHIELD

## 2014-07-27 ENCOUNTER — Ambulatory Visit (HOSPITAL_BASED_OUTPATIENT_CLINIC_OR_DEPARTMENT_OTHER): Payer: BLUE CROSS/BLUE SHIELD | Admitting: Hematology & Oncology

## 2014-07-27 ENCOUNTER — Ambulatory Visit: Payer: BLUE CROSS/BLUE SHIELD

## 2014-07-27 ENCOUNTER — Other Ambulatory Visit (HOSPITAL_BASED_OUTPATIENT_CLINIC_OR_DEPARTMENT_OTHER): Payer: BLUE CROSS/BLUE SHIELD | Admitting: Lab

## 2014-07-27 ENCOUNTER — Encounter: Payer: Self-pay | Admitting: Hematology & Oncology

## 2014-07-27 VITALS — BP 132/74 | HR 74 | Temp 98.4°F | Resp 20 | Ht 65.0 in | Wt 199.0 lb

## 2014-07-27 DIAGNOSIS — C189 Malignant neoplasm of colon, unspecified: Secondary | ICD-10-CM

## 2014-07-27 DIAGNOSIS — C787 Secondary malignant neoplasm of liver and intrahepatic bile duct: Secondary | ICD-10-CM

## 2014-07-27 DIAGNOSIS — C9110 Chronic lymphocytic leukemia of B-cell type not having achieved remission: Secondary | ICD-10-CM

## 2014-07-27 LAB — CBC WITH DIFFERENTIAL (CANCER CENTER ONLY)
BASO#: 0.1 10*3/uL (ref 0.0–0.2)
BASO%: 1.5 % (ref 0.0–2.0)
EOS%: 2.5 % (ref 0.0–7.0)
Eosinophils Absolute: 0.1 10*3/uL (ref 0.0–0.5)
HCT: 45.7 % (ref 38.7–49.9)
HEMOGLOBIN: 16.1 g/dL (ref 13.0–17.1)
LYMPH#: 1.1 10*3/uL (ref 0.9–3.3)
LYMPH%: 21.9 % (ref 14.0–48.0)
MCH: 34.2 pg — ABNORMAL HIGH (ref 28.0–33.4)
MCHC: 35.2 g/dL (ref 32.0–35.9)
MCV: 97 fL (ref 82–98)
MONO#: 0.7 10*3/uL (ref 0.1–0.9)
MONO%: 12.7 % (ref 0.0–13.0)
NEUT%: 61.4 % (ref 40.0–80.0)
NEUTROS ABS: 3.2 10*3/uL (ref 1.5–6.5)
Platelets: 127 10*3/uL — ABNORMAL LOW (ref 145–400)
RBC: 4.71 10*6/uL (ref 4.20–5.70)
RDW: 14.8 % (ref 11.1–15.7)
WBC: 5.2 10*3/uL (ref 4.0–10.0)

## 2014-07-27 LAB — CMP (CANCER CENTER ONLY)
ALT: 32 U/L (ref 10–47)
AST: 37 U/L (ref 11–38)
Albumin: 3.9 g/dL (ref 3.3–5.5)
Alkaline Phosphatase: 80 U/L (ref 26–84)
BUN, Bld: 14 mg/dL (ref 7–22)
CHLORIDE: 99 meq/L (ref 98–108)
CO2: 27 mEq/L (ref 18–33)
Calcium: 9.3 mg/dL (ref 8.0–10.3)
Creat: 1.1 mg/dl (ref 0.6–1.2)
Glucose, Bld: 97 mg/dL (ref 73–118)
Potassium: 4.3 mEq/L (ref 3.3–4.7)
Sodium: 141 mEq/L (ref 128–145)
Total Bilirubin: 1.3 mg/dl (ref 0.20–1.60)
Total Protein: 7.9 g/dL (ref 6.4–8.1)

## 2014-07-27 MED ORDER — CAPECITABINE 500 MG PO TABS: 1500.0000 mg | ORAL_TABLET | Freq: Two times a day (BID) | ORAL | Status: DC
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
## 2014-07-28 ENCOUNTER — Ambulatory Visit (HOSPITAL_COMMUNITY)
Admission: RE | Admit: 2014-07-28 | Discharge: 2014-07-28 | Disposition: A | Discharge status: Home / Self Care | Payer: BLUE CROSS/BLUE SHIELD | Source: Ambulatory Visit | Attending: Diagnostic Radiology | Admitting: Diagnostic Radiology

## 2014-07-28 DIAGNOSIS — C787 Secondary malignant neoplasm of liver and intrahepatic bile duct: Secondary | ICD-10-CM | POA: Insufficient documentation

## 2014-07-28 DIAGNOSIS — C189 Malignant neoplasm of colon, unspecified: Secondary | ICD-10-CM | POA: Diagnosis present

## 2014-07-28 LAB — CEA: CEA: 10.2 ng/mL — AB (ref 0.0–5.0)

## 2014-07-28 MED ORDER — GADOBENATE DIMEGLUMINE 529 MG/ML IV SOLN
18.0000 mL | Freq: Once | INTRAVENOUS | Status: AC | PRN
  Administered 2014-07-28: 18 mL via INTRAVENOUS

## 2014-07-29 ENCOUNTER — Encounter: Payer: Self-pay | Admitting: *Deleted

## 2014-07-29 ENCOUNTER — Other Ambulatory Visit (HOSPITAL_COMMUNITY): Payer: Self-pay | Admitting: Diagnostic Radiology

## 2014-07-29 DIAGNOSIS — C787 Secondary malignant neoplasm of liver and intrahepatic bile duct: Secondary | ICD-10-CM

## 2014-08-03 ENCOUNTER — Other Ambulatory Visit: Payer: Self-pay | Admitting: Diagnostic Radiology

## 2014-08-03 DIAGNOSIS — C189 Malignant neoplasm of colon, unspecified: Secondary | ICD-10-CM

## 2014-08-03 DIAGNOSIS — C787 Secondary malignant neoplasm of liver and intrahepatic bile duct: Principal | ICD-10-CM

## 2014-08-11 ENCOUNTER — Ambulatory Visit
Admission: RE | Admit: 2014-08-11 | Discharge: 2014-08-11 | Disposition: A | Discharge status: Home / Self Care | Payer: BLUE CROSS/BLUE SHIELD | Source: Ambulatory Visit | Attending: Diagnostic Radiology | Admitting: Diagnostic Radiology

## 2014-08-11 DIAGNOSIS — C787 Secondary malignant neoplasm of liver and intrahepatic bile duct: Secondary | ICD-10-CM | POA: Insufficient documentation

## 2014-08-11 HISTORY — PX: IR GENERIC HISTORICAL: IMG1180011

## 2014-08-11 NOTE — Consult Note (Signed)
Chief Complaint: Chief Complaint  Patient presents with  . Follow-up    review MR/discuss Rx     Referring Physician(s): Burney Gauze  History of Present Illness: Ryan Maynard is a 64 y.o. male stage IV colon cancer. The patient has been treated by Dr. Marin Olp with chemotherapy. The patient had innumerable liver lesions which were successfully treated and many of these lesions are now calcified. A PET/CT raised concern for at least 1 hypermetabolic lesion in the right hepatic lobe. I saw the patient in consultation for possible liver directed therapy. Based on that initial visit, I felt the patient would benefit from an MRI to see if there was any additional active tumor burden in the liver. The patient presents for discussion of liver directed options. The patient is accompanied with his wife. The patient is very active and still working. However, the patient is suffering from cold and sinus infection. He was taking an antibiotic for the sinus infection but could not tolerate it.Marland Kitchen He says that his sinus infections can last for long periods of time. Patient has no significant weight loss. She denies abdominal pain. No change in bowel habits. No constipation and no GI bleeding. Patient is currently receiving maintenance therapy of Xeloda.   Past Medical History  Diagnosis Date  . Colon cancer metastasized to liver 06/15/2013    No past surgical history on file.  Allergies: Bactrim and Lyrica  Medications: Prior to Admission medications   Medication Sig Start Date End Date Taking? Authorizing Provider  amLODipine-benazepril (LOTREL) 5-20 MG per capsule Take 1 capsule by mouth daily. 12/22/13   Volanda Napoleon, MD  capecitabine (XELODA) 500 MG tablet Take 3 tablets (1,500 mg total) by mouth 2 (two) times daily after a meal. Take for 14 days, off 7 days. 07/27/14   Volanda Napoleon, MD  Coenzyme Q10 10 MG capsule Take 10 mg by mouth daily.    Historical Provider, MD  dexamethasone  (DECADRON) 4 MG tablet Take 8 mg by mouth 2 (two) times daily with a meal. HR DOES NOT TAKE 06/21/13   Volanda Napoleon, MD  gabapentin (NEURONTIN) 100 MG capsule Take 1 capsule (100 mg total) by mouth 3 (three) times daily. Pt okay to take only once a day 03/02/14   Volanda Napoleon, MD  lidocaine-prilocaine (EMLA) cream Apply 1 application topically as needed. Apply quarter sized amount to portacath site 1-2 hours prior to chemotherapy appt.  Cover with saran wrap. 06/23/13   Volanda Napoleon, MD  LORazepam (ATIVAN) 0.5 MG tablet Take 1 tablet (0.5 mg total) by mouth as needed (Nausea or vomiting). Patient not taking: Reported on 07/14/2014 05/11/14   Volanda Napoleon, MD  ondansetron (ZOFRAN) 8 MG tablet Take 8 mg by mouth 2 (two) times daily. Take two times a day starting the day after chemo for 2 days. DOES NOT TAKE 06/21/13   Volanda Napoleon, MD  sulfamethoxazole-trimethoprim (BACTRIM DS,SEPTRA DS) 800-160 MG per tablet Take 1 tablet by mouth 2 (two) times daily. Patient not taking: Reported on 07/14/2014 05/11/14   Volanda Napoleon, MD     No family history on file.  History   Social History  . Marital Status: Single    Spouse Name: N/A  . Number of Children: N/A  . Years of Education: N/A   Social History Main Topics  . Smoking status: Never Smoker   . Smokeless tobacco: Never Used     Comment: never used tobacco  .  Alcohol Use: Not on file  . Drug Use: Not on file  . Sexual Activity: Not on file   Other Topics Concern  . Not on file   Social History Narrative    ECOG Status: 0 - Asymptomatic    Review of Systems  Constitutional: Negative.   HENT: Positive for congestion and sinus pressure.   Respiratory: Negative.   Cardiovascular: Negative.   Gastrointestinal: Negative.   Genitourinary: Negative.     Vital Signs: BP 148/88 mmHg  Pulse 85  Temp(Src) 98.6 F (37 C) (Oral)  Resp 14  SpO2 98%  Physical Exam  Constitutional: He appears well-developed and  well-nourished.  Vitals reviewed.      Imaging: Mr Abdomen W Wo Contrast  07/28/2014   CLINICAL DATA:  Colon cancer with liver metastases, possible Y 90 candidate, evaluate 1.8 cm right hepatic lobe liver lesion noted on PET  EXAM: MRI ABDOMEN WITHOUT AND WITH CONTRAST  TECHNIQUE: Multiplanar multisequence MR imaging of the abdomen was performed both before and after the administration of intravenous contrast.  CONTRAST:  63mL MULTIHANCE GADOBENATE DIMEGLUMINE 529 MG/ML IV SOLN  COMPARISON:  PET-CT dated 06/01/2014  FINDINGS: Lower chest:  Lung bases are clear.  Hepatobiliary: Numerous hypoenhancing lesions throughout the liver, corresponding to calcified metastases on the CT portion of prior PET-CT. While peripheral rim enhancement is possible, almost none of these lesions demonstrate any degree of central enhancement.  However, there is a single 2.1 x 2.1 x 1.5 cm enhancing lesion in the posterior segment right hepatic lobe (series 100/image 44) which restricts diffusion (series 4/image 18) and corresponds to the hypermetabolic lesion on CT, suspicious for viable tumor.  A second 1.6 cm satellite lesion may be present just medially to index lesion (series 100/image 42), although this is equivocal, as there is no corresponding restricted diffusion or lesion on prior PET.  Vague hypermetabolism in the hepatic dome on MRI likely corresponds to a 3.7 x 4.7 cm multilobulated hypoenhancing lesion at the junction of segment 8 and 4A (series 100/ image 31), although without definite central enhancement to suggest viable tumor. Additionally, there is no associated restricted diffusion.  Gallbladder is unremarkable. No intrahepatic or extrahepatic ductal dilatation.  Pancreas: Within normal limits.  Spleen: Within normal limits.  Adrenals/Urinary Tract: Adrenal glands are within normal limits.  2.6 cm anterior left upper pole renal cysts. Right kidney is within normal limits. No enhancing renal lesions. No  hydronephrosis.  Stomach/Bowel: Stomach and visualized bowel are unremarkable.  Vascular/Lymphatic: No evidence of abdominal aortic aneurysm.  No suspicious abdominal lymphadenopathy.  Other: No abdominal ascites.  Musculoskeletal: Degenerative changes with mild lumbar levoscoliosis.  IMPRESSION: 2.1 cm enhancing lesion with restricted diffusion in the posterior segment right hepatic lobe, suspicious for viable tumor, corresponding to the hypermetabolic lesion on CT.  Possible 1.6 cm adjacent satellite lesion in the posterior segment right hepatic lobe, although without corresponding restricted diffusion, indeterminate.  3.7 x 4.7 cm hypoenhancing lesion at the junction of segment 8 and 4A, without definite central enhancement to suggest viable tumor, corresponding to vague hypermetabolism on prior PET. This is favored to reflect treated metastasis, although attention on follow-up is suggested.  Numerous additional hypoenhancing lesions throughout the liver, corresponding to calcified metastases on PET-CT, without definite central enhancement or restricted diffusion. These are favored to reflect treated metastases.   Electronically Signed   By: Julian Hy M.D.   On: 07/28/2014 10:50    Labs:  CBC:  Recent Labs  05/11/14 0755 06/08/14 0920  07/06/14 0939 07/27/14 0945  WBC 3.7* 4.4 4.4 5.2  HGB 15.4 16.2 16.0 16.1  HCT 43.2 46.0 45.7 45.7  PLT 114* 127* 117* 127*    COAGS: No results for input(s): INR, APTT in the last 8760 hours.  BMP:  Recent Labs  05/11/14 0755 06/08/14 0920 07/06/14 0940 07/27/14 0945  NA 142 140 141 141  K 3.7 3.9 4.2 4.3  CL 104 103 101 99  CO2 25 26 27 27   GLUCOSE 117 98 104 97  BUN 13 12 12 14   CALCIUM 9.2 9.2 9.1 9.3  CREATININE 1.0 0.9 0.7 1.1    LIVER FUNCTION TESTS:  Recent Labs  05/11/14 0755 06/08/14 0920 07/06/14 0940 07/27/14 0945  BILITOT 1.00 1.10 1.10 1.30  AST 44* 39* 40* 37  ALT 44 35 36 32  ALKPHOS 77 91* 75 80  PROT 7.5  8.0 7.5 7.9    TUMOR MARKERS:  Recent Labs  05/11/14 0755 06/08/14 0920 07/06/14 0940 07/27/14 0945  CEA 9.8* 9.2* 10.2* 10.2*    Assessment and Plan:    64 year old with metastatic colon cancer to the liver. His CEA level and imaging studies have demonstrated an excellent response to treatment. Based on the PET imaging and MRI, there are 2 suspicious residual lesions in the right hepatic lobe which are adjacent to each other. I reviewed the patient's pertinent imaging with the patient and his wife. We discussed the different liver directed therapies which included Y 90 radioembolization and percutaneous thermal ablation. Based on the location and small size of the 2 lesions in the right hepatic lobe, I feel the patient would be an excellent candidate for percutaneous thermal ablation of the these lesions.  We discussed image guided thermal ablation in depth. The patient understands that he will be put under general anesthesia for the procedure. We explained the risks of the procedure which included bleeding and infection. We will plan to keep the patient overnight after the procedure for observation. We discussed the risks of infection after the procedure and the possibility of pain and nausea. The patient and his wife have a very good understanding of the procedure. They would like to pursue image guided thermal ablation of the residual small liver tumors. The patient has been tentatively put on the schedule at [facility] for 08/26/2014. We will plan to perform the CT-guided ablation at that time.  Patient is currently dealing with a sinus infection. I asked the patient to see his general practitioner or follow-up with Dr. Marin Olp if the infection does not improve within the next week. We would like to have this infection resolved prior to the ablation.  SignedCarylon Perches 08/11/2014, 4:35 PM   I spent a total of   15 Minutes in face to face in clinical consultation, greater  than 50% of which was counseling/coordinating care for metastatic colon cancer.

## 2014-08-22 ENCOUNTER — Other Ambulatory Visit: Payer: Self-pay | Admitting: Radiology

## 2014-08-22 NOTE — Patient Instructions (Addendum)
Diana Armijo  08/22/2014   Your procedure is scheduled on: Friday 08/26/14  Report to [facility] Main  Entrance and follow signs to               Radiology at 06:30 AM.  Call this number if you have problems the morning of surgery [phone]   Remember:  Do not eat food or drink liquids :After Midnight.     Take these medicines the morning of surgery with A SIP OF WATER: Gabapentin, Nasal spray                               You may not have any metal on your body including hair pins and              piercings  Do not wear jewelry, make-up, lotions, powders or perfumes.             Do not wear nail polish.  Do not shave  48 hours prior to surgery.              Men may shave face and neck.  Do not bring valuables to the hospital. [place].  Contacts, dentures or bridgework may not be worn into surgery.  Leave suitcase in the car. After surgery it may be brought to your room.     Patients discharged the day of surgery will not be allowed to drive home.  Name and phone number of your driver: N/A  Special Instructions: N/A _____________________________________________________________________             [facility] - Preparing for Surgery Before surgery, you can play an important role.  Because skin is not sterile, your skin needs to be as free of germs as possible.  You can reduce the number of germs on your skin by washing with CHG (chlorahexidine gluconate) soap before surgery.  CHG is an antiseptic cleaner which kills germs and bonds with the skin to continue killing germs even after washing. Please DO NOT use if you have an allergy to CHG or antibacterial soaps.  If your skin becomes reddened/irritated stop using the CHG and inform your nurse when you arrive at Short Stay. Do not shave (including legs and underarms) for at least 48 hours prior to the first CHG shower.  You may shave your face/neck. Please  follow these instructions carefully:  1.  Shower with CHG Soap the night before surgery and the  morning of Surgery.  2.  If you choose to wash your hair, wash your hair first as usual with your  normal  shampoo.  3.  After you shampoo, rinse your hair and body thoroughly to remove the  shampoo.                            4.  Use CHG as you would any other liquid soap.  You can apply chg directly  to the skin and wash                       Gently with a scrungie or clean washcloth.  5.  Apply the CHG Soap to your body ONLY FROM THE NECK DOWN.  Do not use on face/ open                           Wound or open sores. Avoid contact with eyes, ears mouth and genitals (private parts).                       Wash face,  Genitals (private parts) with your normal soap.             6.  Wash thoroughly, paying special attention to the area where your surgery  will be performed.  7.  Thoroughly rinse your body with warm water from the neck down.  8.  DO NOT shower/wash with your normal soap after using and rinsing off  the CHG Soap.                9.  Pat yourself dry with a clean towel.            10.  Wear clean pajamas.            11.  Place clean sheets on your bed the night of your first shower and do not  sleep with pets. Day of Surgery : Do not apply any lotions/deodorants the morning of surgery.  Please wear clean clothes to the hospital/surgery center.  FAILURE TO FOLLOW THESE INSTRUCTIONS MAY RESULT IN THE CANCELLATION OF YOUR SURGERY PATIENT SIGNATURE_________________________________  NURSE SIGNATURE__________________________________  ________________________________________________________________________  WHAT IS A BLOOD TRANSFUSION? Blood Transfusion Information  A transfusion is the replacement of blood or some of its parts. Blood is made up of multiple cells which provide different functions.  Red blood cells carry oxygen and are used for blood loss replacement.  White blood cells  fight against infection.  Platelets control bleeding.  Plasma helps clot blood.  Other blood products are available for specialized needs, such as hemophilia or other clotting disorders. BEFORE THE TRANSFUSION  Who gives blood for transfusions?   Healthy volunteers who are fully evaluated to make sure their blood is safe. This is blood bank blood. Transfusion therapy is the safest it has ever been in the practice of medicine. Before blood is taken from a donor, a complete history is taken to make sure that person has no history of diseases nor engages in risky social behavior (examples are intravenous drug use or sexual activity with multiple partners). The donor's travel history is screened to minimize risk of transmitting infections, such as malaria. The donated blood is tested for signs of infectious diseases, such as HIV and hepatitis. The blood is then tested to be sure it is compatible with you in order to minimize the chance of a transfusion reaction. If you or a relative donates blood, this is often done in anticipation of surgery and is not appropriate for emergency situations. It takes many days to process the donated blood. RISKS AND COMPLICATIONS Although transfusion therapy is very safe and saves many lives, the main dangers of transfusion include:   Getting an infectious disease.  Developing a transfusion reaction. This is an allergic reaction to something in the blood you were given. Every precaution is taken to prevent this. The decision to have a blood transfusion has been considered carefully by your caregiver before blood is given. Blood is not given unless the benefits outweigh the risks. AFTER THE TRANSFUSION  Right after receiving a blood transfusion, you will usually feel much better and more energetic. This is especially  true if your red blood cells have gotten low (anemic). The transfusion raises the level of the red blood cells which carry oxygen, and this usually  causes an energy increase.  The nurse administering the transfusion will monitor you carefully for complications. HOME CARE INSTRUCTIONS  No special instructions are needed after a transfusion. You may find your energy is better. Speak with your caregiver about any limitations on activity for underlying diseases you may have. SEEK MEDICAL CARE IF:   Your condition is not improving after your transfusion.  You develop redness or irritation at the intravenous (IV) site. SEEK IMMEDIATE MEDICAL CARE IF:  Any of the following symptoms occur over the next 12 hours:  Shaking chills.  You have a temperature by mouth above 102 F (38.9 C), not controlled by medicine.  Chest, back, or muscle pain.  People around you feel you are not acting correctly or are confused.  Shortness of breath or difficulty breathing.  Dizziness and fainting.  You get a rash or develop hives.  You have a decrease in urine output.  Your urine turns a dark color or changes to pink, red, or brown. Any of the following symptoms occur over the next 10 days:  You have a temperature by mouth above 102 F (38.9 C), not controlled by medicine.  Shortness of breath.  Weakness after normal activity.  The white part of the eye turns yellow (jaundice).  You have a decrease in the amount of urine or are urinating less often.  Your urine turns a dark color or changes to pink, red, or brown. Document Released: 05/17/2000 Document Revised: 08/12/2011 Document Reviewed: 01/04/2008 [facility] Patient Information 2014 [place], [place].  _______________________________________________________________________

## 2014-08-22 NOTE — Progress Notes (Signed)
Called for orders procedure 08-26-14 pre op 08-23-14 Thanks

## 2014-08-22 NOTE — Progress Notes (Deleted)
Please put in order for consent for surgery 08/26/14. Pre-op appointment 08/23/14. Thank you.

## 2014-08-23 ENCOUNTER — Encounter (HOSPITAL_COMMUNITY): Payer: Self-pay

## 2014-08-23 ENCOUNTER — Other Ambulatory Visit (HOSPITAL_COMMUNITY): Payer: Self-pay | Admitting: *Deleted

## 2014-08-23 ENCOUNTER — Encounter (HOSPITAL_COMMUNITY)
Admission: RE | Admit: 2014-08-23 | Discharge: 2014-08-23 | Disposition: A | Discharge status: Home / Self Care | Payer: BLUE CROSS/BLUE SHIELD | Source: Ambulatory Visit | Attending: Diagnostic Radiology | Admitting: Diagnostic Radiology

## 2014-08-23 DIAGNOSIS — K769 Liver disease, unspecified: Secondary | ICD-10-CM | POA: Diagnosis present

## 2014-08-23 HISTORY — DX: Other specified postprocedural states: Z98.890

## 2014-08-23 HISTORY — DX: Personal history of antineoplastic chemotherapy: Z92.21

## 2014-08-23 HISTORY — DX: Essential (primary) hypertension: I10

## 2014-08-23 HISTORY — DX: Nausea with vomiting, unspecified: R11.2

## 2014-08-23 LAB — CBC WITH DIFFERENTIAL/PLATELET
Basophils Absolute: 0.1 10*3/uL (ref 0.0–0.1)
Basophils Relative: 1 % (ref 0–1)
Eosinophils Absolute: 0.2 10*3/uL (ref 0.0–0.7)
Eosinophils Relative: 3 % (ref 0–5)
HCT: 45.2 % (ref 39.0–52.0)
Hemoglobin: 15.8 g/dL (ref 13.0–17.0)
Lymphocytes Relative: 22 % (ref 12–46)
Lymphs Abs: 1.3 10*3/uL (ref 0.7–4.0)
MCH: 34.3 pg — AB (ref 26.0–34.0)
MCHC: 35 g/dL (ref 30.0–36.0)
MCV: 98 fL (ref 78.0–100.0)
MONO ABS: 0.5 10*3/uL (ref 0.1–1.0)
MONOS PCT: 8 % (ref 3–12)
NEUTROS PCT: 66 % (ref 43–77)
Neutro Abs: 4.1 10*3/uL (ref 1.7–7.7)
Platelets: 157 10*3/uL (ref 150–400)
RBC: 4.61 MIL/uL (ref 4.22–5.81)
RDW: 14.8 % (ref 11.5–15.5)
WBC: 6.1 10*3/uL (ref 4.0–10.5)

## 2014-08-23 LAB — COMPREHENSIVE METABOLIC PANEL
ALK PHOS: 86 U/L (ref 39–117)
ALT: 29 U/L (ref 0–53)
AST: 34 U/L (ref 0–37)
Albumin: 4.5 g/dL (ref 3.5–5.2)
Anion gap: 9 (ref 5–15)
BUN: 15 mg/dL (ref 6–23)
CHLORIDE: 103 mmol/L (ref 96–112)
CO2: 24 mmol/L (ref 19–32)
Calcium: 9.2 mg/dL (ref 8.4–10.5)
Creatinine, Ser: 0.85 mg/dL (ref 0.50–1.35)
GFR, EST NON AFRICAN AMERICAN: 89 mL/min — AB (ref >90)
Glucose, Bld: 113 mg/dL — ABNORMAL HIGH (ref 70–99)
Potassium: 3.7 mmol/L (ref 3.5–5.1)
SODIUM: 136 mmol/L (ref 135–145)
TOTAL PROTEIN: 8 g/dL (ref 6.0–8.3)
Total Bilirubin: 0.9 mg/dL (ref 0.3–1.2)

## 2014-08-23 LAB — APTT: aPTT: 31 seconds (ref 24–37)

## 2014-08-23 LAB — PROTIME-INR
INR: 0.99 (ref 0.00–1.49)
Prothrombin Time: 13.2 seconds (ref 11.6–15.2)

## 2014-08-25 ENCOUNTER — Other Ambulatory Visit: Payer: Self-pay | Admitting: Radiology

## 2014-08-25 ENCOUNTER — Other Ambulatory Visit: Payer: Self-pay | Admitting: Family

## 2014-08-25 MED ORDER — DEXTROSE 5 % IV SOLN: 2.0000 g | Freq: Once | INTRAVENOUS | Status: AC

## 2014-08-26 ENCOUNTER — Encounter (HOSPITAL_COMMUNITY): Payer: Self-pay | Admitting: *Deleted

## 2014-08-26 ENCOUNTER — Ambulatory Visit (HOSPITAL_COMMUNITY): Payer: BLUE CROSS/BLUE SHIELD | Admitting: Certified Registered Nurse Anesthetist

## 2014-08-26 ENCOUNTER — Ambulatory Visit (HOSPITAL_COMMUNITY)
Admission: RE | Admit: 2014-08-26 | Discharge: 2014-08-26 | Disposition: A | Discharge status: Home / Self Care | Payer: BLUE CROSS/BLUE SHIELD | Source: Ambulatory Visit | Attending: Diagnostic Radiology | Admitting: Diagnostic Radiology

## 2014-08-26 ENCOUNTER — Encounter (HOSPITAL_COMMUNITY): Payer: Self-pay

## 2014-08-26 ENCOUNTER — Observation Stay (HOSPITAL_COMMUNITY)
Admission: RE | Admit: 2014-08-26 | Discharge: 2014-08-27 | Disposition: A | Discharge status: Home / Self Care | Payer: BLUE CROSS/BLUE SHIELD | Source: Ambulatory Visit | Attending: Diagnostic Radiology | Admitting: Diagnostic Radiology

## 2014-08-26 ENCOUNTER — Encounter (HOSPITAL_COMMUNITY)
Admission: RE | Disposition: A | Discharge status: Home / Self Care | Payer: Self-pay | Source: Ambulatory Visit | Attending: Diagnostic Radiology

## 2014-08-26 DIAGNOSIS — K769 Liver disease, unspecified: Secondary | ICD-10-CM | POA: Diagnosis not present

## 2014-08-26 DIAGNOSIS — C189 Malignant neoplasm of colon, unspecified: Secondary | ICD-10-CM | POA: Diagnosis present

## 2014-08-26 DIAGNOSIS — C787 Secondary malignant neoplasm of liver and intrahepatic bile duct: Secondary | ICD-10-CM

## 2014-08-26 LAB — ABO/RH: ABO/RH(D): A POS

## 2014-08-26 LAB — TYPE AND SCREEN
ABO/RH(D): A POS
Antibody Screen: NEGATIVE

## 2014-08-26 SURGERY — RADIO FREQUENCY ABLATION
Anesthesia: General

## 2014-08-26 MED ORDER — PHENYLEPHRINE HCL 10 MG/ML IJ SOLN
INTRAMUSCULAR | Status: AC
  Filled 2014-08-26: qty 1

## 2014-08-26 MED ORDER — NEOSTIGMINE METHYLSULFATE 10 MG/10ML IV SOLN
INTRAVENOUS | Status: DC | PRN
  Administered 2014-08-26: 5 mg via INTRAVENOUS

## 2014-08-26 MED ORDER — PIPERACILLIN-TAZOBACTAM 3.375 G IVPB
3.3750 g | Freq: Once | INTRAVENOUS | Status: AC
  Administered 2014-08-26: 3.375 g via INTRAVENOUS
  Filled 2014-08-26 (×2): qty 50

## 2014-08-26 MED ORDER — ROCURONIUM BROMIDE 100 MG/10ML IV SOLN
INTRAVENOUS | Status: DC | PRN
  Administered 2014-08-26: 10 mg via INTRAVENOUS
  Administered 2014-08-26: 20 mg via INTRAVENOUS
  Administered 2014-08-26: 40 mg via INTRAVENOUS

## 2014-08-26 MED ORDER — PROPOFOL 10 MG/ML IV BOLUS
INTRAVENOUS | Status: DC | PRN
Start: 2014-08-26 — End: 2014-08-26
  Administered 2014-08-26: 50 mg via INTRAVENOUS
  Administered 2014-08-26: 150 mg via INTRAVENOUS
  Administered 2014-08-26 (×2): 50 mg via INTRAVENOUS

## 2014-08-26 MED ORDER — METOCLOPRAMIDE HCL 5 MG/ML IJ SOLN
INTRAMUSCULAR | Status: AC
  Filled 2014-08-26: qty 2

## 2014-08-26 MED ORDER — MIDAZOLAM HCL 2 MG/2ML IJ SOLN
INTRAMUSCULAR | Status: AC
  Filled 2014-08-26: qty 2

## 2014-08-26 MED ORDER — FENTANYL CITRATE 0.05 MG/ML IJ SOLN
INTRAMUSCULAR | Status: AC
  Administered 2014-08-26: 100 ug
  Filled 2014-08-26: qty 2

## 2014-08-26 MED ORDER — MIDAZOLAM HCL 5 MG/5ML IJ SOLN
INTRAMUSCULAR | Status: DC | PRN
  Administered 2014-08-26 (×2): 1 mg via INTRAVENOUS

## 2014-08-26 MED ORDER — OXYCODONE HCL 5 MG PO TABS
5.0000 mg | ORAL_TABLET | ORAL | Status: DC | PRN
  Administered 2014-08-26 (×2): 5 mg via ORAL
  Filled 2014-08-26: qty 1

## 2014-08-26 MED ORDER — ONDANSETRON HCL 4 MG/2ML IJ SOLN
INTRAMUSCULAR | Status: AC
  Filled 2014-08-26: qty 2

## 2014-08-26 MED ORDER — KETAMINE HCL 10 MG/ML IJ SOLN
INTRAMUSCULAR | Status: AC
  Filled 2014-08-26: qty 1

## 2014-08-26 MED ORDER — OXYCODONE HCL 5 MG PO TABS
5.0000 mg | ORAL_TABLET | ORAL | Status: DC | PRN
  Filled 2014-08-26: qty 1

## 2014-08-26 MED ORDER — ROCURONIUM BROMIDE 100 MG/10ML IV SOLN
INTRAVENOUS | Status: AC
  Filled 2014-08-26: qty 1

## 2014-08-26 MED ORDER — PROMETHAZINE HCL 25 MG/ML IJ SOLN: 6.2500 mg | INTRAMUSCULAR | Status: DC | PRN

## 2014-08-26 MED ORDER — GLYCOPYRROLATE 0.2 MG/ML IJ SOLN
INTRAMUSCULAR | Status: DC | PRN
  Administered 2014-08-26: 0.6 mg via INTRAVENOUS

## 2014-08-26 MED ORDER — FENTANYL CITRATE 0.05 MG/ML IJ SOLN
INTRAMUSCULAR | Status: AC
  Filled 2014-08-26: qty 5

## 2014-08-26 MED ORDER — LIDOCAINE HCL (CARDIAC) 20 MG/ML IV SOLN
INTRAVENOUS | Status: DC | PRN
  Administered 2014-08-26: 50 mg via INTRAVENOUS

## 2014-08-26 MED ORDER — DOCUSATE SODIUM 100 MG PO CAPS
100.0000 mg | ORAL_CAPSULE | Freq: Two times a day (BID) | ORAL | Status: DC
  Filled 2014-08-26 (×2): qty 1

## 2014-08-26 MED ORDER — LACTATED RINGERS IV SOLN
INTRAVENOUS | Status: DC
  Administered 2014-08-26 (×3): via INTRAVENOUS

## 2014-08-26 MED ORDER — HYDROMORPHONE HCL 2 MG/ML IJ SOLN
INTRAMUSCULAR | Status: AC
  Filled 2014-08-26: qty 1

## 2014-08-26 MED ORDER — PANTOPRAZOLE SODIUM 40 MG IV SOLR
40.0000 mg | Freq: Two times a day (BID) | INTRAVENOUS | Status: DC
  Administered 2014-08-26: 40 mg via INTRAVENOUS
  Filled 2014-08-26 (×3): qty 40

## 2014-08-26 MED ORDER — PROPOFOL 10 MG/ML IV BOLUS
INTRAVENOUS | Status: AC
  Filled 2014-08-26: qty 20

## 2014-08-26 MED ORDER — FENTANYL CITRATE 0.05 MG/ML IJ SOLN
INTRAMUSCULAR | Status: AC
  Filled 2014-08-26: qty 2

## 2014-08-26 MED ORDER — PHENYLEPHRINE HCL 10 MG/ML IJ SOLN
INTRAMUSCULAR | Status: DC | PRN
  Administered 2014-08-26 (×2): 120 ug via INTRAVENOUS
  Administered 2014-08-26: 80 ug via INTRAVENOUS
  Administered 2014-08-26: 120 ug via INTRAVENOUS
  Administered 2014-08-26: 80 ug via INTRAVENOUS
  Administered 2014-08-26: 40 ug via INTRAVENOUS
  Administered 2014-08-26: 120 ug via INTRAVENOUS
  Administered 2014-08-26: 40 ug via INTRAVENOUS
  Administered 2014-08-26 (×3): 80 ug via INTRAVENOUS

## 2014-08-26 MED ORDER — GLYCOPYRROLATE 0.2 MG/ML IJ SOLN
INTRAMUSCULAR | Status: AC
  Filled 2014-08-26: qty 3

## 2014-08-26 MED ORDER — ONDANSETRON HCL 4 MG/2ML IJ SOLN: 4.0000 mg | Freq: Four times a day (QID) | INTRAMUSCULAR | Status: DC | PRN

## 2014-08-26 MED ORDER — SUCCINYLCHOLINE CHLORIDE 20 MG/ML IJ SOLN
INTRAMUSCULAR | Status: DC | PRN
  Administered 2014-08-26: 100 mg via INTRAVENOUS

## 2014-08-26 MED ORDER — HYDROCODONE-ACETAMINOPHEN 5-325 MG PO TABS: 1.0000 | ORAL_TABLET | ORAL | Status: DC | PRN

## 2014-08-26 MED ORDER — AMLODIPINE BESYLATE 5 MG PO TABS
5.0000 mg | ORAL_TABLET | Freq: Every day | ORAL | Status: DC
  Administered 2014-08-26: 5 mg via ORAL
  Filled 2014-08-26 (×2): qty 1

## 2014-08-26 MED ORDER — ONDANSETRON HCL 4 MG/2ML IJ SOLN
INTRAMUSCULAR | Status: DC | PRN
Start: 2014-08-26 — End: 2014-08-26
  Administered 2014-08-26 (×2): 2 mg via INTRAVENOUS

## 2014-08-26 MED ORDER — PHENYLEPHRINE 40 MCG/ML (10ML) SYRINGE FOR IV PUSH (FOR BLOOD PRESSURE SUPPORT)
PREFILLED_SYRINGE | INTRAVENOUS | Status: AC
Start: 2014-08-26 — End: 2014-08-26
  Filled 2014-08-26: qty 10

## 2014-08-26 MED ORDER — FENTANYL CITRATE 0.05 MG/ML IJ SOLN
25.0000 ug | INTRAMUSCULAR | Status: DC | PRN
  Administered 2014-08-26: 50 ug via INTRAVENOUS

## 2014-08-26 MED ORDER — KETOROLAC TROMETHAMINE 30 MG/ML IJ SOLN
30.0000 mg | Freq: Once | INTRAMUSCULAR | Status: AC
  Administered 2014-08-26: 30 mg via INTRAVENOUS
  Filled 2014-08-26: qty 1

## 2014-08-26 MED ORDER — NEOSTIGMINE METHYLSULFATE 10 MG/10ML IV SOLN
INTRAVENOUS | Status: AC
  Filled 2014-08-26: qty 1

## 2014-08-26 MED ORDER — FENTANYL CITRATE 0.05 MG/ML IJ SOLN
INTRAMUSCULAR | Status: DC | PRN
  Administered 2014-08-26: 100 ug via INTRAVENOUS
  Administered 2014-08-26: 50 ug via INTRAVENOUS

## 2014-08-26 MED ORDER — METOCLOPRAMIDE HCL 5 MG/ML IJ SOLN
INTRAMUSCULAR | Status: DC | PRN
  Administered 2014-08-26: 10 mg via INTRAVENOUS

## 2014-08-26 NOTE — Procedures (Signed)
Post-Procedure Note  Pre-operative Diagnosis: Metastatic colon cancer       Post-operative Diagnosis: Same   Indications: Residual disease after chemotherapy  Procedure Details:   CT-guided microwave ablation of liver lesions with 2 probes.  Treatment time = 10 minutes.   Findings: Hypodense lesions in right hepatic lobe adjacent to calcified treated lesions  Complications: No immediate  EBL:  Minimal     Condition: Stable  Plan:  PACU then overnight observation.

## 2014-08-26 NOTE — H&P (Signed)
History of Present Illness: Ryan Maynard is a 64 y.o. male with stage IV colon cancer with liver metastasis. He has been seen in consult on 08/11/14 with Dr. Anselm Pancoast and scheduled today for CT guided hepatic lesions (2) in the right lobe microwave ablation. He denies any chest pain, shortness of breath or palpitations. He denies any active signs of bleeding or excessive bruising. He denies any recent fever or chills. The patient denies any history of sleep apnea or chronic oxygen use. He has previously tolerated sedation without complications.   Past Medical History  Diagnosis Date  . Hypertension   . Colon cancer metastasized to liver 06/15/2013  . History of chemotherapy jan 2016  . PONV (postoperative nausea and vomiting) age 88    Past Surgical History  Procedure Laterality Date  . Port a cath insertion  14 months ago    right chest  . Skin grafts  age 22    Allergies: Bactrim and Lyrica  Medications: Prior to Admission medications   Medication Sig Start Date End Date Taking? Authorizing Provider  amLODipine-benazepril (LOTREL) 5-20 MG per capsule Take 1 capsule by mouth daily. 12/22/13  Yes Volanda Napoleon, MD  capecitabine (XELODA) 500 MG tablet Take 3 tablets (1,500 mg total) by mouth 2 (two) times daily after a meal. Take for 14 days, off 7 days. 07/27/14  Yes Volanda Napoleon, MD  Coenzyme Q10 10 MG capsule Take 10 mg by mouth daily.   Yes Historical Provider, MD  gabapentin (NEURONTIN) 100 MG capsule Take 1 capsule (100 mg total) by mouth 3 (three) times daily. Pt okay to take only once a day Patient taking differently: Take 100 mg by mouth daily.  03/02/14  Yes Volanda Napoleon, MD  oxymetazoline (AFRIN) 0.05 % nasal spray Place 1 spray into both nostrils 2 (two) times daily as needed for congestion.   Yes Historical Provider, MD  lidocaine-prilocaine (EMLA) cream Apply 1 application topically as needed. Apply quarter sized amount to portacath site 1-2 hours prior to chemotherapy  appt.  Cover with saran wrap. 06/23/13   Volanda Napoleon, MD  LORazepam (ATIVAN) 0.5 MG tablet Take 1 tablet (0.5 mg total) by mouth as needed (Nausea or vomiting). 05/11/14   Volanda Napoleon, MD  ondansetron (ZOFRAN) 8 MG tablet Take 8 mg by mouth 2 (two) times daily. Take two times a day starting the day after chemo for 2 days. DOES NOT TAKE 06/21/13   Volanda Napoleon, MD  sulfamethoxazole-trimethoprim (BACTRIM DS,SEPTRA DS) 800-160 MG per tablet Take 1 tablet by mouth 2 (two) times daily. Patient not taking: Reported on 07/14/2014 05/11/14   Volanda Napoleon, MD     History reviewed. No pertinent family history.  History   Social History  . Marital Status: Single    Spouse Name: N/A  . Number of Children: N/A  . Years of Education: N/A   Social History Main Topics  . Smoking status: Never Smoker   . Smokeless tobacco: Never Used     Comment: never used tobacco  . Alcohol Use: 0.0 oz/week    0 Standard drinks or equivalent per week     Comment: occasional wine  . Drug Use: No  . Sexual Activity: Not on file   Other Topics Concern  . None   Social History Narrative    Review of Systems: A 12 point ROS discussed and pertinent positives are indicated in the HPI above.  All other systems are negative.  Review of Systems  Vital Signs: T: 98.50F, HR: 82 bpm, BP: 154/99 mmHg, o2: 100%RA  Physical Exam  Constitutional: He is oriented to person, place, and time. No distress.  HENT:  Head: Normocephalic and atraumatic.  Neck: No tracheal deviation present.  Cardiovascular: Normal rate and regular rhythm.  Exam reveals no gallop and no friction rub.   No murmur heard. Pulmonary/Chest: Effort normal and breath sounds normal. No respiratory distress. He has no wheezes. He has no rales.  Abdominal: Soft. Bowel sounds are normal. He exhibits no distension. There is no tenderness.  Neurological: He is alert and oriented to person, place, and time.  Skin: He is not diaphoretic.    Psychiatric: He has a normal mood and affect. His behavior is normal. Thought content normal.    Mallampati Score:  MD Evaluation Airway: WNL Heart: WNL Abdomen: WNL Chest/ Lungs: WNL ASA  Classification: 3 Mallampati/Airway Score: Two  Imaging: Mr Abdomen W Wo Contrast  07/28/2014   CLINICAL DATA:  Colon cancer with liver metastases, possible Y 90 candidate, evaluate 1.8 cm right hepatic lobe liver lesion noted on PET  EXAM: MRI ABDOMEN WITHOUT AND WITH CONTRAST  TECHNIQUE: Multiplanar multisequence MR imaging of the abdomen was performed both before and after the administration of intravenous contrast.  CONTRAST:  48mL MULTIHANCE GADOBENATE DIMEGLUMINE 529 MG/ML IV SOLN  COMPARISON:  PET-CT dated 06/01/2014  FINDINGS: Lower chest:  Lung bases are clear.  Hepatobiliary: Numerous hypoenhancing lesions throughout the liver, corresponding to calcified metastases on the CT portion of prior PET-CT. While peripheral rim enhancement is possible, almost none of these lesions demonstrate any degree of central enhancement.  However, there is a single 2.1 x 2.1 x 1.5 cm enhancing lesion in the posterior segment right hepatic lobe (series 100/image 44) which restricts diffusion (series 4/image 18) and corresponds to the hypermetabolic lesion on CT, suspicious for viable tumor.  A second 1.6 cm satellite lesion may be present just medially to index lesion (series 100/image 42), although this is equivocal, as there is no corresponding restricted diffusion or lesion on prior PET.  Vague hypermetabolism in the hepatic dome on MRI likely corresponds to a 3.7 x 4.7 cm multilobulated hypoenhancing lesion at the junction of segment 8 and 4A (series 100/ image 31), although without definite central enhancement to suggest viable tumor. Additionally, there is no associated restricted diffusion.  Gallbladder is unremarkable. No intrahepatic or extrahepatic ductal dilatation.  Pancreas: Within normal limits.  Spleen:  Within normal limits.  Adrenals/Urinary Tract: Adrenal glands are within normal limits.  2.6 cm anterior left upper pole renal cysts. Right kidney is within normal limits. No enhancing renal lesions. No hydronephrosis.  Stomach/Bowel: Stomach and visualized bowel are unremarkable.  Vascular/Lymphatic: No evidence of abdominal aortic aneurysm.  No suspicious abdominal lymphadenopathy.  Other: No abdominal ascites.  Musculoskeletal: Degenerative changes with mild lumbar levoscoliosis.  IMPRESSION: 2.1 cm enhancing lesion with restricted diffusion in the posterior segment right hepatic lobe, suspicious for viable tumor, corresponding to the hypermetabolic lesion on CT.  Possible 1.6 cm adjacent satellite lesion in the posterior segment right hepatic lobe, although without corresponding restricted diffusion, indeterminate.  3.7 x 4.7 cm hypoenhancing lesion at the junction of segment 8 and 4A, without definite central enhancement to suggest viable tumor, corresponding to vague hypermetabolism on prior PET. This is favored to reflect treated metastasis, although attention on follow-up is suggested.  Numerous additional hypoenhancing lesions throughout the liver, corresponding to calcified metastases on PET-CT, without definite central enhancement or  restricted diffusion. These are favored to reflect treated metastases.   Electronically Signed   By: Julian Hy M.D.   On: 07/28/2014 10:50    Labs:  CBC:  Recent Labs  06/08/14 0920 07/06/14 0939 07/27/14 0945 08/23/14 1420  WBC 4.4 4.4 5.2 6.1  HGB 16.2 16.0 16.1 15.8  HCT 46.0 45.7 45.7 45.2  PLT 127* 117* 127* 157    COAGS:  Recent Labs  08/23/14 1420  INR 0.99  APTT 31    BMP:  Recent Labs  06/08/14 0920 07/06/14 0940 07/27/14 0945 08/23/14 1420  NA 140 141 141 136  K 3.9 4.2 4.3 3.7  CL 103 101 99 103  CO2 26 27 27 24   GLUCOSE 98 104 97 113*  BUN 12 12 14 15   CALCIUM 9.2 9.1 9.3 9.2  CREATININE 0.9 0.7 1.1 0.85   GFRNONAA  --   --   --  89*  GFRAA  --   --   --  >90    LIVER FUNCTION TESTS:  Recent Labs  06/08/14 0920 07/06/14 0940 07/27/14 0945 08/23/14 1420  BILITOT 1.10 1.10 1.30 0.9  AST 39* 40* 37 34  ALT 35 36 32 29  ALKPHOS 91* 75 80 86  PROT 8.0 7.5 7.9 8.0  ALBUMIN  --   --   --  4.5    TUMOR MARKERS:  Recent Labs  05/11/14 0755 06/08/14 0920 07/06/14 0940 07/27/14 0945  CEA 9.8* 9.2* 10.2* 10.2*    Assessment and Plan: Metastatic colon cancer with liver metastasis  Seen in consult on 08/11/14 with Dr. Anselm Pancoast Scheduled today for CT guided 2 small right hepatic lesion microwave ablation The patient has been NPO, no blood thinners taken, labs and vitals have been reviewed. Risks and Benefits discussed with the patient including, but not limited to bleeding, infection, liver failure, bile duct injury, pneumothorax or damage to adjacent structures. All of the patient's questions were answered, patient is agreeable to proceed. Consent signed and in chart.   SignedHedy Jacob 08/26/2014, 8:20 AM

## 2014-08-26 NOTE — Transfer of Care (Signed)
Immediate Anesthesia Transfer of Care Note  Patient: Ryan Maynard  Procedure(s) Performed: Procedure(s): MICROWAVE THERMAL ABLATION (N/A)  Patient Location: PACU  Anesthesia Type:General  Level of Consciousness: Patient easily awoken, sedated, comfortable, cooperative, following commands, responds to stimulation.   Airway & Oxygen Therapy: Patient spontaneously breathing, ventilating well, oxygen via simple oxygen mask.  Post-op Assessment: Report given to PACU RN, vital signs reviewed and stable, moving all extremities.   Post vital signs: Reviewed and stable.  Complications: No apparent anesthesia complications

## 2014-08-26 NOTE — Anesthesia Postprocedure Evaluation (Signed)
  Anesthesia Post-op Note  Patient: Ryan Maynard  Procedure(s) Performed: Procedure(s): MICROWAVE THERMAL ABLATION (N/A)  Patient Location: PACU  Anesthesia Type:General  Level of Consciousness: awake and alert   Airway and Oxygen Therapy: Patient Spontanous Breathing  Post-op Pain: none  Post-op Assessment: Post-op Vital signs reviewed  Post-op Vital Signs: Reviewed  Last Vitals:  Filed Vitals:   08/26/14 1223  BP: 175/91  Pulse: 85  Temp: 36.9 C  Resp: 18    Complications: No apparent anesthesia complications

## 2014-08-26 NOTE — Progress Notes (Signed)
Pt has a PAC but states that"the last time it was used it was hard to get blood out of it". Peripheral IV was started without problem left wrist #18 ga x 1.6 inch and lab work obtained peripherally.

## 2014-08-26 NOTE — Anesthesia Procedure Notes (Signed)
Procedure Name: Intubation Date/Time: 08/26/2014 8:51 AM Performed by: Deliah Boston Pre-anesthesia Checklist: Patient identified, Emergency Drugs available, Suction available and Patient being monitored Patient Re-evaluated:Patient Re-evaluated prior to inductionOxygen Delivery Method: Circle System Utilized Preoxygenation: Pre-oxygenation with 100% oxygen Intubation Type: IV induction Ventilation: Mask ventilation without difficulty Laryngoscope Size: Mac and 4 Grade View: Grade II Tube type: Oral Tube size: 7.5 mm Number of attempts: 1 Airway Equipment and Method: Oral airway Placement Confirmation: ETT inserted through vocal cords under direct vision,  positive ETCO2 and breath sounds checked- equal and bilateral Secured at: 21 cm Tube secured with: Tape Dental Injury: Teeth and Oropharynx as per pre-operative assessment

## 2014-08-26 NOTE — Progress Notes (Signed)
Subjective: Patient states his RUQ is 5/10, he denies any N/V. He has had his foley removed and has urinated on his own without any hematuria.   Allergies: Bactrim and Lyrica  Medications: Prior to Admission medications   Medication Sig Start Date End Date Taking? Authorizing Provider  amLODipine-benazepril (LOTREL) 5-20 MG per capsule Take 1 capsule by mouth daily. 12/22/13  Yes Volanda Napoleon, MD  capecitabine (XELODA) 500 MG tablet Take 3 tablets (1,500 mg total) by mouth 2 (two) times daily after a meal. Take for 14 days, off 7 days. 07/27/14  Yes Volanda Napoleon, MD  Coenzyme Q10 10 MG capsule Take 10 mg by mouth daily.   Yes Historical Provider, MD  gabapentin (NEURONTIN) 100 MG capsule Take 1 capsule (100 mg total) by mouth 3 (three) times daily. Pt okay to take only once a day Patient taking differently: Take 100 mg by mouth daily.  03/02/14  Yes Volanda Napoleon, MD  oxymetazoline (AFRIN) 0.05 % nasal spray Place 1 spray into both nostrils 2 (two) times daily as needed for congestion.   Yes Historical Provider, MD  lidocaine-prilocaine (EMLA) cream Apply 1 application topically as needed. Apply quarter sized amount to portacath site 1-2 hours prior to chemotherapy appt.  Cover with saran wrap. 06/23/13   Volanda Napoleon, MD  LORazepam (ATIVAN) 0.5 MG tablet Take 1 tablet (0.5 mg total) by mouth as needed (Nausea or vomiting). 05/11/14   Volanda Napoleon, MD  ondansetron (ZOFRAN) 8 MG tablet Take 8 mg by mouth 2 (two) times daily. Take two times a day starting the day after chemo for 2 days. DOES NOT TAKE 06/21/13   Volanda Napoleon, MD  sulfamethoxazole-trimethoprim (BACTRIM DS,SEPTRA DS) 800-160 MG per tablet Take 1 tablet by mouth 2 (two) times daily. Patient not taking: Reported on 07/14/2014 05/11/14   Volanda Napoleon, MD     Vital Signs: BP 175/91 mmHg  Pulse 85  Temp(Src) 98.4 F (36.9 C) (Oral)  Resp 18  Ht 5\' 5"  (1.651 m)  Wt 203 lb (92.08 kg)  BMI 33.78 kg/m2  SpO2  93%  Physical Exam General: A&Ox3, NAD Abd: Soft, NT, ND, (+) BS, RUQ procedure site band-aid intact without tenderness, bleeding or hematoma  Imaging: Ct Guide Tissue Ablation  08/26/2014   ADDENDUM REPORT: 08/26/2014 15:00  ADDENDUM: Correction:  Zosyn 3.375 g was given NOT Ancef.   Electronically Signed   By: Markus Daft M.D.   On: 08/26/2014 15:00   08/26/2014   CLINICAL DATA:  64 year old with metastatic colon cancer. The patient responded very well to chemotherapy but there are small residual liver lesions. Patient scheduled for CT-guided ablation of right hepatic lesions.  EXAM: CT-GUIDED THERMAL ABLATION OF LIVER LESIONS  Physician: Stephan Minister. Anselm Pancoast, MD  FLUOROSCOPY TIME:  No fluoroscopy  MEDICATIONS: Ancef 2 g.  ANESTHESIA/SEDATION: General anesthesia.  PROCEDURE: The procedure was explained to the patient. The risks and benefits of the procedure were discussed and the patient's questions were addressed. Informed consent was obtained from the patient. Patient was placed under general anesthesia.  CT images of the abdomen were obtained. The subtle hypodense lesions in the right hepatic lobe were targeted. Right side of the abdomen was prepped and draped in sterile fashion. Maximal barrier sterile technique was utilized including caps, mask, sterile gowns, sterile gloves, sterile drape, hand hygiene and skin antiseptic.  Spinal needles were directed towards the lesions with CT guidance. Subsequently, two PR NeuWave probes were directed into  the liver with CT guidance. One probe was directed more caudal to treat the inferior aspect of the dominant lesion. The second probe was angled along the superior aspect of the larger lesion and the tip was placed near the expected area of the second smaller lesion. The microwave treatment was performed for 10 minutes. The tracts were cauterized as the probes were removed. CT images were obtained during and after the treatment. Bandages were placed at the puncture  sites.  FINDINGS: Innumerable calcified lesions throughout the liver consistent with known treated lesions. There is a poorly defined hypoechoic area in the posterior right hepatic lobe, measuring roughly the at 2 cm, and inferior to calcified lesions. This hypodense lesion corresponds with the known enhancing and hypermetabolic lesion on previous imaging. The patient also had another lesion slightly medial to the larger lesion. The microwave probes were directed through the dominant right hepatic lesion and the more cephalad probe was directed towards the smaller lesion as well. Small amount of expected gas at the ablation sites during and after the treatment.  Estimated blood loss: Minimal  COMPLICATIONS: None  IMPRESSION: CT-guided microwave ablation of right hepatic lesions.  Electronically Signed: By: Markus Daft M.D. On: 08/26/2014 13:54    Labs:  CBC:  Recent Labs  06/08/14 0920 07/06/14 0939 07/27/14 0945 08/23/14 1420  WBC 4.4 4.4 5.2 6.1  HGB 16.2 16.0 16.1 15.8  HCT 46.0 45.7 45.7 45.2  PLT 127* 117* 127* 157    COAGS:  Recent Labs  08/23/14 1420  INR 0.99  APTT 31    BMP:  Recent Labs  06/08/14 0920 07/06/14 0940 07/27/14 0945 08/23/14 1420  NA 140 141 141 136  K 3.9 4.2 4.3 3.7  CL 103 101 99 103  CO2 26 27 27 24   GLUCOSE 98 104 97 113*  BUN 12 12 14 15   CALCIUM 9.2 9.1 9.3 9.2  CREATININE 0.9 0.7 1.1 0.85  GFRNONAA  --   --   --  89*  GFRAA  --   --   --  >90    LIVER FUNCTION TESTS:  Recent Labs  06/08/14 0920 07/06/14 0940 07/27/14 0945 08/23/14 1420  BILITOT 1.10 1.10 1.30 0.9  AST 39* 40* 37 34  ALT 35 36 32 29  ALKPHOS 91* 75 80 86  PROT 8.0 7.5 7.9 8.0  ALBUMIN  --   --   --  4.5    Assessment and Plan: Metastatic colon cancer with liver metastasis  Seen in consult on 08/11/14 with Dr. Anselm Pancoast S/p successful CT guided microwave ablation of 2 right hepatic lesions- No complications Admit overnight for observation and pain control D/c  in am if stable and F/u in clinic with Dr. Anselm Pancoast in 2-4 weeks with F/U MRI in 2 months   Signed: Hedy Jacob 08/26/2014, 3:15 PM

## 2014-08-26 NOTE — Anesthesia Preprocedure Evaluation (Addendum)
Anesthesia Evaluation  Patient identified by MRN, date of birth, ID band Patient awake    Reviewed: Allergy & Precautions, NPO status , Patient's Chart, lab work & pertinent test results  History of Anesthesia Complications (+) PONV  Airway Mallampati: III  TM Distance: >3 FB Neck ROM: Full    Dental  (+) Teeth Intact, Dental Advisory Given   Pulmonary neg pulmonary ROS,  breath sounds clear to auscultation        Cardiovascular hypertension, Pt. on medications Rhythm:Regular Rate:Normal     Neuro/Psych negative neurological ROS     GI/Hepatic LFT's normal. Metastatic colon CA to liver Colon CA with mets to liver   Endo/Other  negative endocrine ROS  Renal/GU negative Renal ROS     Musculoskeletal negative musculoskeletal ROS (+)   Abdominal   Peds  Hematology negative hematology ROS (+)   Anesthesia Other Findings   Reproductive/Obstetrics                           Anesthesia Physical Anesthesia Plan  ASA: II  Anesthesia Plan: General   Post-op Pain Management:    Induction: Intravenous  Airway Management Planned: Oral ETT  Additional Equipment:   Intra-op Plan:   Post-operative Plan: Extubation in OR  Informed Consent: I have reviewed the patients History and Physical, chart, labs and discussed the procedure including the risks, benefits and alternatives for the proposed anesthesia with the patient or authorized representative who has indicated his/her understanding and acceptance.   Dental advisory given  Plan Discussed with: CRNA  Anesthesia Plan Comments:        Anesthesia Quick Evaluation

## 2014-08-27 DIAGNOSIS — K769 Liver disease, unspecified: Secondary | ICD-10-CM | POA: Diagnosis not present

## 2014-08-27 LAB — COMPREHENSIVE METABOLIC PANEL
ALBUMIN: 3.9 g/dL (ref 3.5–5.2)
ALT: 232 U/L — ABNORMAL HIGH (ref 0–53)
AST: 313 U/L — ABNORMAL HIGH (ref 0–37)
Alkaline Phosphatase: 81 U/L (ref 39–117)
Anion gap: 9 (ref 5–15)
BUN: 12 mg/dL (ref 6–23)
CO2: 26 mmol/L (ref 19–32)
CREATININE: 0.86 mg/dL (ref 0.50–1.35)
Calcium: 8.6 mg/dL (ref 8.4–10.5)
Chloride: 102 mmol/L (ref 96–112)
GFR, EST NON AFRICAN AMERICAN: 89 mL/min — AB (ref >90)
Glucose, Bld: 111 mg/dL — ABNORMAL HIGH (ref 70–99)
POTASSIUM: 3.6 mmol/L (ref 3.5–5.1)
Sodium: 137 mmol/L (ref 135–145)
Total Bilirubin: 1.8 mg/dL — ABNORMAL HIGH (ref 0.3–1.2)
Total Protein: 7.1 g/dL (ref 6.0–8.3)

## 2014-08-27 LAB — CBC
HCT: 42.7 % (ref 39.0–52.0)
Hemoglobin: 14.9 g/dL (ref 13.0–17.0)
MCH: 34.6 pg — ABNORMAL HIGH (ref 26.0–34.0)
MCHC: 34.9 g/dL (ref 30.0–36.0)
MCV: 99.1 fL (ref 78.0–100.0)
Platelets: 121 10*3/uL — ABNORMAL LOW (ref 150–400)
RBC: 4.31 MIL/uL (ref 4.22–5.81)
RDW: 14.4 % (ref 11.5–15.5)
WBC: 6.7 10*3/uL (ref 4.0–10.5)

## 2014-08-27 NOTE — Discharge Summary (Signed)
Patient ID: Ryan Maynard MRN: [id number] DOB/AGE: 1951/01/11 64 y.o.  Admit date: 08/26/2014 Discharge date: 08/27/2014  Admission Diagnoses: Metastatic colon cancer to liver  Discharge Diagnoses:  Treatment of liver lesion Active Problems:   Metastatic colon cancer to liver   Discharged Condition: stable; improved  Hospital Course: pt with Hx colon ca; known metastatic liver lesions. Consulted with Dr Anselm Pancoast regarding liver lesion microwave ablation. Procedure performed 08/26/14 in IR with Dr Anselm Pancoast 2 small liver lesions treated during procedure. No complication. Pt has done well overnight. Eating well; No N/V No pain. Urinating well on own. Ambulating without assistance. I have seen and examined pt; discussed with Dr Anselm Pancoast. Discharge now to home.  Consults: None  Significant Diagnostic Studies: Abdominal CT  Treatments: Liver lesion x 2 microwave ablation  Discharge Exam: Blood pressure 142/90, pulse 69, temperature 98.7 F (37.1 C), temperature source Oral, resp. rate 18, height 5\' 5"  (1.651 m), weight 92.08 kg (203 lb), SpO2 97 %.  PE:  A/O Pleasant Appropriate Heart: RRR Lungs: CTA Abd: soft; +BS; NT Site of ablation: clean and dry; NT No bleeding; no hematoma Ext: FROM; ambulating well UOP good: yellow  Results for orders placed or performed during the hospital encounter of 08/26/14  CBC  Result Value Ref Range   WBC 6.7 4.0 - 10.5 K/uL   RBC 4.31 4.22 - 5.81 MIL/uL   Hemoglobin 14.9 13.0 - 17.0 g/dL   HCT 42.7 39.0 - 52.0 %   MCV 99.1 78.0 - 100.0 fL   MCH 34.6 (H) 26.0 - 34.0 pg   MCHC 34.9 30.0 - 36.0 g/dL   RDW 14.4 11.5 - 15.5 %   Platelets 121 (L) 150 - 400 K/uL  Comprehensive metabolic panel  Result Value Ref Range   Sodium 137 135 - 145 mmol/L   Potassium 3.6 3.5 - 5.1 mmol/L   Chloride 102 96 - 112 mmol/L   CO2 26 19 - 32 mmol/L   Glucose, Bld 111 (H) 70 - 99 mg/dL   BUN 12 6 - 23 mg/dL   Creatinine, Ser 0.86 0.50 - 1.35 mg/dL   Calcium  8.6 8.4 - 10.5 mg/dL   Total Protein 7.1 6.0 - 8.3 g/dL   Albumin 3.9 3.5 - 5.2 g/dL   AST 313 (H) 0 - 37 U/L   ALT 232 (H) 0 - 53 U/L   Alkaline Phosphatase 81 39 - 117 U/L   Total Bilirubin 1.8 (H) 0.3 - 1.2 mg/dL   GFR calc non Af Amer 89 (L) >90 mL/min   GFR calc Af Amer >90 >90 mL/min   Anion gap 9 5 - 15  Type and screen  Result Value Ref Range   ABO/RH(D) A POS    Antibody Screen NEG    Sample Expiration 08/29/2014   ABO/Rh  Result Value Ref Range   ABO/RH(D) A POS     Disposition: Metastatic colon cancer to liver Microwave ablation 2 right lobe liver lesions performed in IR with Dr Anselm Pancoast 08/26/14 Pt has tolerated procedure well Overnight stay without issue Plan for dc to home now. Continue all home meds. Rx: Norco 3/325 mg #30; Zofran 4 mg #10 To follow up with Dr Anselm Pancoast in clinic 2-4 weeks; pt will hear from scheduler Pt has good understanding of plan  Discharge Instructions    Call MD for:  difficulty breathing, headache or visual disturbances    Complete by:  As directed      Call MD for:  extreme  fatigue    Complete by:  As directed      Call MD for:  hives    Complete by:  As directed      Call MD for:  persistant dizziness or light-headedness    Complete by:  As directed      Call MD for:  persistant nausea and vomiting    Complete by:  As directed      Call MD for:  redness, tenderness, or signs of infection (pain, swelling, redness, odor or green/yellow discharge around incision site)    Complete by:  As directed      Call MD for:  severe uncontrolled pain    Complete by:  As directed      Call MD for:  temperature >100.4    Complete by:  As directed      Diet - low sodium heart healthy    Complete by:  As directed      Discharge instructions    Complete by:  As directed   Resume all home meds; Rx Norco 5/325 mg #30; Zofran 4 mg #10; to follow up with Dr Anselm Pancoast in 2-4 weeks; call [phone] if any questions or concerns; he will hear from scheduler for appt  time and date     Discharge wound care:    Complete by:  As directed   May shower today; keep clean band aid on site daily x 1 week     Driving Restrictions    Complete by:  As directed   No driving x 1 week     Increase activity slowly    Complete by:  As directed      Lifting restrictions    Complete by:  As directed   No lifting over 10 lbs x 1 week            Medication List    STOP taking these medications        ondansetron 8 MG tablet  Commonly known as:  ZOFRAN     sulfamethoxazole-trimethoprim 800-160 MG per tablet  Commonly known as:  BACTRIM DS,SEPTRA DS      TAKE these medications        amLODipine-benazepril 5-20 MG per capsule  Commonly known as:  LOTREL  Take 1 capsule by mouth daily.     capecitabine 500 MG tablet  Commonly known as:  XELODA  Take 3 tablets (1,500 mg total) by mouth 2 (two) times daily after a meal. Take for 14 days, off 7 days.     Coenzyme Q10 10 MG capsule  Take 10 mg by mouth daily.     gabapentin 100 MG capsule  Commonly known as:  NEURONTIN  Take 1 capsule (100 mg total) by mouth 3 (three) times daily. Pt okay to take only once a day     lidocaine-prilocaine cream  Commonly known as:  EMLA  Apply 1 application topically as needed. Apply quarter sized amount to portacath site 1-2 hours prior to chemotherapy appt.  Cover with saran wrap.     LORazepam 0.5 MG tablet  Commonly known as:  ATIVAN  Take 1 tablet (0.5 mg total) by mouth as needed (Nausea or vomiting).     oxymetazoline 0.05 % nasal spray  Commonly known as:  AFRIN  Place 1 spray into both nostrils 2 (two) times daily as needed for congestion.         I have spent (less than 30 minutes coordinating discharge for [name].  Signed: [facility] 08/27/2014, 8:45 AM

## 2014-08-29 ENCOUNTER — Other Ambulatory Visit (HOSPITAL_COMMUNITY): Payer: Self-pay | Admitting: Radiology

## 2014-08-29 ENCOUNTER — Ambulatory Visit (HOSPITAL_COMMUNITY)
Admission: RE | Admit: 2014-08-29 | Discharge: 2014-08-29 | Disposition: A | Discharge status: Home / Self Care | Payer: BLUE CROSS/BLUE SHIELD | Source: Ambulatory Visit | Attending: Diagnostic Radiology | Admitting: Diagnostic Radiology

## 2014-08-29 DIAGNOSIS — C787 Secondary malignant neoplasm of liver and intrahepatic bile duct: Secondary | ICD-10-CM | POA: Insufficient documentation

## 2014-08-29 DIAGNOSIS — C189 Malignant neoplasm of colon, unspecified: Secondary | ICD-10-CM | POA: Diagnosis present

## 2014-08-29 DIAGNOSIS — Z79899 Other long term (current) drug therapy: Secondary | ICD-10-CM | POA: Insufficient documentation

## 2014-08-29 DIAGNOSIS — I10 Essential (primary) hypertension: Secondary | ICD-10-CM | POA: Insufficient documentation

## 2014-08-29 DIAGNOSIS — Z9221 Personal history of antineoplastic chemotherapy: Secondary | ICD-10-CM | POA: Insufficient documentation

## 2014-08-29 MED ORDER — KETOROLAC TROMETHAMINE 30 MG/ML IJ SOLN
30.0000 mg | Freq: Once | INTRAMUSCULAR | Status: AC
  Administered 2014-08-29: 30 mg via INTRAMUSCULAR

## 2014-08-29 MED ORDER — KETOROLAC TROMETHAMINE 30 MG/ML IJ SOLN
INTRAMUSCULAR | Status: AC
  Filled 2014-08-29: qty 1

## 2014-08-29 NOTE — Progress Notes (Signed)
Mr. Viana underwent CT guided microwave ablation of right hepatic lobe lesion on 3/25. Was discharged without issue on 3/26 but since then has had significant discomfort in the right flank region, worse when taking deep breaths,. He denies SOB or CP, fevers. Denies N/V, though appetite has been down some. We asked that he come in today for assessment. He's been taking Norco 5/325 with limited relief.  There were no vitals taken for this visit. Lungs: CTA bilaterally Heart: regular Abd: soft, mildly tender in RUQ with deep palpation Skin puncture site clean, no evidence of thermal burn, erythema or hematoma.  He likely has some capsular inflammation so we will make some changes to his meds. He got relief with Toradol while in the hospital so we'll give him a one time IM injection of 30mg  Toradol today Then he'll take a 5 day tapering course of prednisone and he'll take Oxy IR 5mg  for prn pain instead of the Norco.  Pt seen by Dr. Anselm Pancoast for this visit as well.  Ascencion Dike PA-C Interventional Radiology 08/29/2014 10:41 AM

## 2014-08-31 ENCOUNTER — Other Ambulatory Visit (HOSPITAL_BASED_OUTPATIENT_CLINIC_OR_DEPARTMENT_OTHER): Payer: BLUE CROSS/BLUE SHIELD

## 2014-08-31 ENCOUNTER — Other Ambulatory Visit: Payer: Self-pay | Admitting: *Deleted

## 2014-08-31 ENCOUNTER — Ambulatory Visit (HOSPITAL_BASED_OUTPATIENT_CLINIC_OR_DEPARTMENT_OTHER): Payer: BLUE CROSS/BLUE SHIELD | Admitting: Hematology & Oncology

## 2014-08-31 VITALS — BP 155/81 | HR 87 | Temp 98.0°F | Resp 18 | Ht 65.0 in | Wt 197.0 lb

## 2014-08-31 DIAGNOSIS — I10 Essential (primary) hypertension: Secondary | ICD-10-CM

## 2014-08-31 DIAGNOSIS — C189 Malignant neoplasm of colon, unspecified: Secondary | ICD-10-CM | POA: Diagnosis not present

## 2014-08-31 DIAGNOSIS — C787 Secondary malignant neoplasm of liver and intrahepatic bile duct: Principal | ICD-10-CM

## 2014-08-31 DIAGNOSIS — C9110 Chronic lymphocytic leukemia of B-cell type not having achieved remission: Secondary | ICD-10-CM

## 2014-08-31 LAB — CBC WITH DIFFERENTIAL (CANCER CENTER ONLY)
BASO#: 0 10*3/uL (ref 0.0–0.2)
BASO%: 0.4 % (ref 0.0–2.0)
EOS%: 1.4 % (ref 0.0–7.0)
Eosinophils Absolute: 0.1 10*3/uL (ref 0.0–0.5)
HCT: 42.5 % (ref 38.7–49.9)
HEMOGLOBIN: 15.3 g/dL (ref 13.0–17.1)
LYMPH#: 1.4 10*3/uL (ref 0.9–3.3)
LYMPH%: 14.3 % (ref 14.0–48.0)
MCH: 34.2 pg — ABNORMAL HIGH (ref 28.0–33.4)
MCHC: 36 g/dL — ABNORMAL HIGH (ref 32.0–35.9)
MCV: 95 fL (ref 82–98)
MONO#: 1 10*3/uL — ABNORMAL HIGH (ref 0.1–0.9)
MONO%: 10.4 % (ref 0.0–13.0)
NEUT#: 7.2 10*3/uL — ABNORMAL HIGH (ref 1.5–6.5)
NEUT%: 73.5 % (ref 40.0–80.0)
Platelets: 183 10*3/uL (ref 145–400)
RBC: 4.48 10*6/uL (ref 4.20–5.70)
RDW: 13.8 % (ref 11.1–15.7)
WBC: 9.8 10*3/uL (ref 4.0–10.0)

## 2014-08-31 LAB — CMP (CANCER CENTER ONLY)
ALBUMIN: 3.8 g/dL (ref 3.3–5.5)
ALT(SGPT): 99 U/L — ABNORMAL HIGH (ref 10–47)
AST: 59 U/L — AB (ref 11–38)
Alkaline Phosphatase: 113 U/L — ABNORMAL HIGH (ref 26–84)
BILIRUBIN TOTAL: 1.2 mg/dL (ref 0.20–1.60)
BUN, Bld: 12 mg/dL (ref 7–22)
CO2: 29 mEq/L (ref 18–33)
CREATININE: 0.8 mg/dL (ref 0.6–1.2)
Calcium: 9.7 mg/dL (ref 8.0–10.3)
Chloride: 98 mEq/L (ref 98–108)
Glucose, Bld: 115 mg/dL (ref 73–118)
Potassium: 3.8 mEq/L (ref 3.3–4.7)
SODIUM: 140 meq/L (ref 128–145)
Total Protein: 8.2 g/dL — ABNORMAL HIGH (ref 6.4–8.1)

## 2014-08-31 LAB — CEA: CEA: 7.9 ng/mL — AB (ref 0.0–5.0)

## 2014-08-31 LAB — CHCC SATELLITE - SMEAR

## 2014-08-31 MED ORDER — AMLODIPINE BESY-BENAZEPRIL HCL 5-20 MG PO CAPS: 1.0000 | ORAL_CAPSULE | Freq: Every day | ORAL | Status: DC
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
## 2014-08-31 NOTE — Addendum Note (Signed)
Addended by: Volanda Napoleon on: 08/31/2014 08:37 AM   Modules accepted: Orders

## 2014-09-01 ENCOUNTER — Telehealth: Payer: Self-pay | Admitting: Nurse Practitioner

## 2014-09-01 ENCOUNTER — Encounter: Payer: Self-pay | Admitting: Nurse Practitioner

## 2014-09-01 NOTE — Telephone Encounter (Addendum)
LVM on pt's personal machine and informed him to contact our office with any further questions or concerns.   ----- Message from Volanda Napoleon, MD sent at 08/31/2014  4:11 PM EDT ----- Please call and let him know that his CEA is now 7.9. His liver function test or a little elevated but much better than when he had his procedure. Thanks

## 2014-09-05 ENCOUNTER — Other Ambulatory Visit: Payer: Self-pay | Admitting: *Deleted

## 2014-09-05 DIAGNOSIS — C787 Secondary malignant neoplasm of liver and intrahepatic bile duct: Principal | ICD-10-CM

## 2014-09-05 DIAGNOSIS — I10 Essential (primary) hypertension: Secondary | ICD-10-CM

## 2014-09-05 DIAGNOSIS — C189 Malignant neoplasm of colon, unspecified: Secondary | ICD-10-CM

## 2014-09-05 MED ORDER — AMLODIPINE BESY-BENAZEPRIL HCL 5-20 MG PO CAPS: 1.0000 | ORAL_CAPSULE | Freq: Every day | ORAL | Status: DC

## 2014-09-21 ENCOUNTER — Other Ambulatory Visit (HOSPITAL_BASED_OUTPATIENT_CLINIC_OR_DEPARTMENT_OTHER): Payer: BLUE CROSS/BLUE SHIELD

## 2014-09-21 ENCOUNTER — Ambulatory Visit (HOSPITAL_BASED_OUTPATIENT_CLINIC_OR_DEPARTMENT_OTHER): Payer: BLUE CROSS/BLUE SHIELD | Admitting: Hematology & Oncology

## 2014-09-21 ENCOUNTER — Encounter: Payer: Self-pay | Admitting: Hematology & Oncology

## 2014-09-21 VITALS — BP 147/84 | HR 77 | Temp 98.0°F | Resp 18 | Ht 65.0 in | Wt 200.0 lb

## 2014-09-21 DIAGNOSIS — C189 Malignant neoplasm of colon, unspecified: Secondary | ICD-10-CM | POA: Diagnosis not present

## 2014-09-21 DIAGNOSIS — C787 Secondary malignant neoplasm of liver and intrahepatic bile duct: Secondary | ICD-10-CM

## 2014-09-21 DIAGNOSIS — I10 Essential (primary) hypertension: Secondary | ICD-10-CM

## 2014-09-21 LAB — CMP (CANCER CENTER ONLY)
ALBUMIN: 4 g/dL (ref 3.3–5.5)
ALT(SGPT): 33 U/L (ref 10–47)
AST: 35 U/L (ref 11–38)
Alkaline Phosphatase: 97 U/L — ABNORMAL HIGH (ref 26–84)
BUN, Bld: 16 mg/dL (ref 7–22)
CALCIUM: 9.4 mg/dL (ref 8.0–10.3)
CHLORIDE: 105 meq/L (ref 98–108)
CO2: 28 mEq/L (ref 18–33)
Creat: 1 mg/dl (ref 0.6–1.2)
Glucose, Bld: 104 mg/dL (ref 73–118)
POTASSIUM: 4.3 meq/L (ref 3.3–4.7)
SODIUM: 142 meq/L (ref 128–145)
TOTAL PROTEIN: 8.1 g/dL (ref 6.4–8.1)
Total Bilirubin: 1 mg/dl (ref 0.20–1.60)

## 2014-09-21 LAB — CBC WITH DIFFERENTIAL (CANCER CENTER ONLY)
BASO#: 0.1 10*3/uL (ref 0.0–0.2)
BASO%: 1.3 % (ref 0.0–2.0)
EOS%: 2.2 % (ref 0.0–7.0)
Eosinophils Absolute: 0.1 10*3/uL (ref 0.0–0.5)
HCT: 46.4 % (ref 38.7–49.9)
HEMOGLOBIN: 16.3 g/dL (ref 13.0–17.1)
LYMPH#: 1.2 10*3/uL (ref 0.9–3.3)
LYMPH%: 22.8 % (ref 14.0–48.0)
MCH: 33.5 pg — AB (ref 28.0–33.4)
MCHC: 35.1 g/dL (ref 32.0–35.9)
MCV: 96 fL (ref 82–98)
MONO#: 0.5 10*3/uL (ref 0.1–0.9)
MONO%: 9.4 % (ref 0.0–13.0)
NEUT#: 3.4 10*3/uL (ref 1.5–6.5)
NEUT%: 64.3 % (ref 40.0–80.0)
Platelets: 152 10*3/uL (ref 145–400)
RBC: 4.86 10*6/uL (ref 4.20–5.70)
RDW: 14.3 % (ref 11.1–15.7)
WBC: 5.3 10*3/uL (ref 4.0–10.0)

## 2014-09-21 LAB — LACTATE DEHYDROGENASE: LDH: 143 U/L (ref 94–250)

## 2014-09-21 LAB — CEA: CEA: 12.1 ng/mL — AB (ref 0.0–5.0)
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
## 2014-09-22 ENCOUNTER — Encounter: Payer: Self-pay | Admitting: *Deleted

## 2014-09-28 ENCOUNTER — Ambulatory Visit
Admission: RE | Admit: 2014-09-28 | Discharge: 2014-09-28 | Disposition: A | Discharge status: Home / Self Care | Payer: BLUE CROSS/BLUE SHIELD | Source: Ambulatory Visit | Attending: Radiology | Admitting: Radiology

## 2014-09-28 DIAGNOSIS — C189 Malignant neoplasm of colon, unspecified: Secondary | ICD-10-CM

## 2014-09-28 DIAGNOSIS — C787 Secondary malignant neoplasm of liver and intrahepatic bile duct: Secondary | ICD-10-CM
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
## 2014-10-11 ENCOUNTER — Other Ambulatory Visit (HOSPITAL_COMMUNITY): Payer: Self-pay | Admitting: Diagnostic Radiology

## 2014-10-11 DIAGNOSIS — C787 Secondary malignant neoplasm of liver and intrahepatic bile duct: Secondary | ICD-10-CM

## 2014-10-12 ENCOUNTER — Encounter: Payer: Self-pay | Admitting: Hematology & Oncology

## 2014-10-12 ENCOUNTER — Other Ambulatory Visit (HOSPITAL_BASED_OUTPATIENT_CLINIC_OR_DEPARTMENT_OTHER): Payer: BLUE CROSS/BLUE SHIELD

## 2014-10-12 ENCOUNTER — Ambulatory Visit (HOSPITAL_BASED_OUTPATIENT_CLINIC_OR_DEPARTMENT_OTHER): Payer: BLUE CROSS/BLUE SHIELD | Admitting: Hematology & Oncology

## 2014-10-12 VITALS — BP 122/76 | HR 73 | Temp 98.1°F | Resp 18 | Ht 65.0 in | Wt 199.0 lb

## 2014-10-12 DIAGNOSIS — C787 Secondary malignant neoplasm of liver and intrahepatic bile duct: Secondary | ICD-10-CM | POA: Diagnosis not present

## 2014-10-12 DIAGNOSIS — C189 Malignant neoplasm of colon, unspecified: Secondary | ICD-10-CM

## 2014-10-12 LAB — CBC WITH DIFFERENTIAL (CANCER CENTER ONLY)
BASO#: 0.1 10*3/uL (ref 0.0–0.2)
BASO%: 1.1 % (ref 0.0–2.0)
EOS ABS: 0.1 10*3/uL (ref 0.0–0.5)
EOS%: 1.5 % (ref 0.0–7.0)
HCT: 44.4 % (ref 38.7–49.9)
HEMOGLOBIN: 15.8 g/dL (ref 13.0–17.1)
LYMPH#: 1.2 10*3/uL (ref 0.9–3.3)
LYMPH%: 22.3 % (ref 14.0–48.0)
MCH: 34.1 pg — AB (ref 28.0–33.4)
MCHC: 35.6 g/dL (ref 32.0–35.9)
MCV: 96 fL (ref 82–98)
MONO#: 0.6 10*3/uL (ref 0.1–0.9)
MONO%: 11 % (ref 0.0–13.0)
NEUT%: 64.1 % (ref 40.0–80.0)
NEUTROS ABS: 3.4 10*3/uL (ref 1.5–6.5)
PLATELETS: 182 10*3/uL (ref 145–400)
RBC: 4.63 10*6/uL (ref 4.20–5.70)
RDW: 14.9 % (ref 11.1–15.7)
WBC: 5.4 10*3/uL (ref 4.0–10.0)

## 2014-10-12 LAB — CMP (CANCER CENTER ONLY)
ALBUMIN: 3.8 g/dL (ref 3.3–5.5)
ALT: 32 U/L (ref 10–47)
AST: 34 U/L (ref 11–38)
Alkaline Phosphatase: 80 U/L (ref 26–84)
BILIRUBIN TOTAL: 1.2 mg/dL (ref 0.20–1.60)
BUN, Bld: 15 mg/dL (ref 7–22)
CO2: 27 meq/L (ref 18–33)
Calcium: 9.4 mg/dL (ref 8.0–10.3)
Chloride: 104 mEq/L (ref 98–108)
Creat: 0.9 mg/dl (ref 0.6–1.2)
Glucose, Bld: 103 mg/dL (ref 73–118)
Potassium: 4 mEq/L (ref 3.3–4.7)
Sodium: 141 mEq/L (ref 128–145)
TOTAL PROTEIN: 7.7 g/dL (ref 6.4–8.1)
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
## 2014-10-13 ENCOUNTER — Telehealth: Payer: Self-pay | Admitting: *Deleted

## 2014-10-13 LAB — CEA: CEA: 14.6 ng/mL — ABNORMAL HIGH (ref 0.0–5.0)

## 2014-10-13 NOTE — Telephone Encounter (Signed)
-----   Message from Volanda Napoleon, MD sent at 10/13/2014 11:34 AM EDT ----- Call and let him know that the CEA level is just slightly higher. I still am not concerned about this. Thanks

## 2014-10-17 ENCOUNTER — Encounter: Payer: Self-pay | Admitting: *Deleted

## 2014-11-07 ENCOUNTER — Ambulatory Visit (HOSPITAL_COMMUNITY)
Admission: RE | Admit: 2014-11-07 | Discharge: 2014-11-07 | Disposition: A | Discharge status: Home / Self Care | Payer: BLUE CROSS/BLUE SHIELD | Source: Ambulatory Visit | Attending: Diagnostic Radiology | Admitting: Diagnostic Radiology

## 2014-11-07 DIAGNOSIS — C189 Malignant neoplasm of colon, unspecified: Secondary | ICD-10-CM | POA: Diagnosis not present

## 2014-11-07 DIAGNOSIS — Z9889 Other specified postprocedural states: Secondary | ICD-10-CM | POA: Insufficient documentation

## 2014-11-07 DIAGNOSIS — M899 Disorder of bone, unspecified: Secondary | ICD-10-CM | POA: Diagnosis not present

## 2014-11-07 DIAGNOSIS — C787 Secondary malignant neoplasm of liver and intrahepatic bile duct: Secondary | ICD-10-CM | POA: Diagnosis present

## 2014-11-07 MED ORDER — GADOBENATE DIMEGLUMINE 529 MG/ML IV SOLN
19.0000 mL | Freq: Once | INTRAVENOUS | Status: AC | PRN
  Administered 2014-11-07: 19 mL via INTRAVENOUS

## 2014-11-09 ENCOUNTER — Ambulatory Visit (HOSPITAL_BASED_OUTPATIENT_CLINIC_OR_DEPARTMENT_OTHER): Payer: BLUE CROSS/BLUE SHIELD

## 2014-11-09 ENCOUNTER — Other Ambulatory Visit (HOSPITAL_BASED_OUTPATIENT_CLINIC_OR_DEPARTMENT_OTHER): Payer: BLUE CROSS/BLUE SHIELD

## 2014-11-09 ENCOUNTER — Ambulatory Visit (HOSPITAL_BASED_OUTPATIENT_CLINIC_OR_DEPARTMENT_OTHER): Payer: BLUE CROSS/BLUE SHIELD | Admitting: Hematology & Oncology

## 2014-11-09 VITALS — BP 142/83 | HR 76 | Temp 97.9°F | Resp 20 | Wt 199.0 lb

## 2014-11-09 DIAGNOSIS — Z452 Encounter for adjustment and management of vascular access device: Secondary | ICD-10-CM | POA: Diagnosis not present

## 2014-11-09 DIAGNOSIS — C787 Secondary malignant neoplasm of liver and intrahepatic bile duct: Secondary | ICD-10-CM

## 2014-11-09 DIAGNOSIS — C189 Malignant neoplasm of colon, unspecified: Secondary | ICD-10-CM

## 2014-11-09 LAB — CMP (CANCER CENTER ONLY)
ALBUMIN: 4.2 g/dL (ref 3.3–5.5)
ALK PHOS: 75 U/L (ref 26–84)
ALT(SGPT): 31 U/L (ref 10–47)
AST: 36 U/L (ref 11–38)
BILIRUBIN TOTAL: 1.4 mg/dL (ref 0.20–1.60)
BUN: 12 mg/dL (ref 7–22)
CO2: 27 mEq/L (ref 18–33)
CREATININE: 1.2 mg/dL (ref 0.6–1.2)
Calcium: 9.4 mg/dL (ref 8.0–10.3)
Chloride: 106 mEq/L (ref 98–108)
Glucose, Bld: 104 mg/dL (ref 73–118)
Potassium: 4.2 mEq/L (ref 3.3–4.7)
SODIUM: 140 meq/L (ref 128–145)
Total Protein: 7.6 g/dL (ref 6.4–8.1)

## 2014-11-09 LAB — CBC WITH DIFFERENTIAL (CANCER CENTER ONLY)
BASO#: 0.1 10*3/uL (ref 0.0–0.2)
BASO%: 2.1 % — ABNORMAL HIGH (ref 0.0–2.0)
EOS%: 1.8 % (ref 0.0–7.0)
Eosinophils Absolute: 0.1 10*3/uL (ref 0.0–0.5)
HEMATOCRIT: 44.1 % (ref 38.7–49.9)
HGB: 15.9 g/dL (ref 13.0–17.1)
LYMPH#: 1.2 10*3/uL (ref 0.9–3.3)
LYMPH%: 20.6 % (ref 14.0–48.0)
MCH: 34.9 pg — AB (ref 28.0–33.4)
MCHC: 36.1 g/dL — AB (ref 32.0–35.9)
MCV: 97 fL (ref 82–98)
MONO#: 0.7 10*3/uL (ref 0.1–0.9)
MONO%: 11.4 % (ref 0.0–13.0)
NEUT%: 64.1 % (ref 40.0–80.0)
NEUTROS ABS: 3.7 10*3/uL (ref 1.5–6.5)
Platelets: 173 10*3/uL (ref 145–400)
RBC: 4.55 10*6/uL (ref 4.20–5.70)
RDW: 15.7 % (ref 11.1–15.7)
WBC: 5.7 10*3/uL (ref 4.0–10.0)

## 2014-11-09 LAB — CEA: CEA: 21.6 ng/mL — AB (ref 0.0–5.0)

## 2014-11-09 MED ORDER — SODIUM CHLORIDE 0.9 % IJ SOLN
10.0000 mL | INTRAMUSCULAR | Status: DC | PRN
  Administered 2014-11-09: 10 mL via INTRAVENOUS
  Filled 2014-11-09: qty 10

## 2014-11-09 MED ORDER — HEPARIN SOD (PORK) LOCK FLUSH 100 UNIT/ML IV SOLN
500.0000 [IU] | Freq: Once | INTRAVENOUS | Status: AC
  Administered 2014-11-09: 500 [IU] via INTRAVENOUS
  Filled 2014-11-09: qty 5
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
## 2014-11-09 NOTE — Patient Instructions (Signed)

## 2014-11-16 ENCOUNTER — Ambulatory Visit
Admission: RE | Admit: 2014-11-16 | Discharge: 2014-11-16 | Disposition: A | Discharge status: Home / Self Care | Payer: BLUE CROSS/BLUE SHIELD | Source: Ambulatory Visit | Attending: Diagnostic Radiology | Admitting: Diagnostic Radiology

## 2014-11-16 DIAGNOSIS — C787 Secondary malignant neoplasm of liver and intrahepatic bile duct: Secondary | ICD-10-CM
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
## 2014-11-18 ENCOUNTER — Ambulatory Visit (HOSPITAL_COMMUNITY): Payer: BLUE CROSS/BLUE SHIELD

## 2014-11-23 ENCOUNTER — Encounter (HOSPITAL_COMMUNITY)
Admission: RE | Admit: 2014-11-23 | Discharge: 2014-11-23 | Disposition: A | Discharge status: Home / Self Care | Payer: BLUE CROSS/BLUE SHIELD | Source: Ambulatory Visit | Attending: Hematology & Oncology | Admitting: Hematology & Oncology

## 2014-11-23 DIAGNOSIS — C189 Malignant neoplasm of colon, unspecified: Secondary | ICD-10-CM | POA: Diagnosis present

## 2014-11-23 DIAGNOSIS — C787 Secondary malignant neoplasm of liver and intrahepatic bile duct: Secondary | ICD-10-CM | POA: Insufficient documentation

## 2014-11-23 LAB — GLUCOSE, CAPILLARY: Glucose-Capillary: 102 mg/dL — ABNORMAL HIGH (ref 65–99)

## 2014-11-23 MED ORDER — FLUDEOXYGLUCOSE F - 18 (FDG) INJECTION
9.9700 | Freq: Once | INTRAVENOUS | Status: AC | PRN
  Administered 2014-11-23: 9.97 via INTRAVENOUS

## 2014-11-28 ENCOUNTER — Other Ambulatory Visit: Payer: Self-pay | Admitting: Hematology & Oncology

## 2014-11-28 DIAGNOSIS — C787 Secondary malignant neoplasm of liver and intrahepatic bile duct: Principal | ICD-10-CM

## 2014-11-28 DIAGNOSIS — C189 Malignant neoplasm of colon, unspecified: Secondary | ICD-10-CM

## 2014-11-29 ENCOUNTER — Other Ambulatory Visit (HOSPITAL_COMMUNITY): Payer: Self-pay | Admitting: Diagnostic Radiology

## 2014-11-29 DIAGNOSIS — C189 Malignant neoplasm of colon, unspecified: Secondary | ICD-10-CM

## 2014-11-29 DIAGNOSIS — C787 Secondary malignant neoplasm of liver and intrahepatic bile duct: Secondary | ICD-10-CM

## 2014-12-02 ENCOUNTER — Other Ambulatory Visit (HOSPITAL_BASED_OUTPATIENT_CLINIC_OR_DEPARTMENT_OTHER): Payer: BLUE CROSS/BLUE SHIELD

## 2014-12-02 ENCOUNTER — Ambulatory Visit (HOSPITAL_BASED_OUTPATIENT_CLINIC_OR_DEPARTMENT_OTHER): Payer: BLUE CROSS/BLUE SHIELD

## 2014-12-02 ENCOUNTER — Encounter: Payer: Self-pay | Admitting: Hematology & Oncology

## 2014-12-02 ENCOUNTER — Ambulatory Visit (HOSPITAL_BASED_OUTPATIENT_CLINIC_OR_DEPARTMENT_OTHER): Payer: BLUE CROSS/BLUE SHIELD | Admitting: Hematology & Oncology

## 2014-12-02 VITALS — BP 114/65 | HR 79 | Temp 98.0°F | Resp 18 | Ht 65.0 in | Wt 202.0 lb

## 2014-12-02 DIAGNOSIS — C787 Secondary malignant neoplasm of liver and intrahepatic bile duct: Secondary | ICD-10-CM

## 2014-12-02 DIAGNOSIS — Z5111 Encounter for antineoplastic chemotherapy: Secondary | ICD-10-CM

## 2014-12-02 DIAGNOSIS — C189 Malignant neoplasm of colon, unspecified: Secondary | ICD-10-CM

## 2014-12-02 LAB — CBC WITH DIFFERENTIAL (CANCER CENTER ONLY)
BASO#: 0.1 10*3/uL (ref 0.0–0.2)
BASO%: 1 % (ref 0.0–2.0)
EOS%: 2 % (ref 0.0–7.0)
Eosinophils Absolute: 0.2 10*3/uL (ref 0.0–0.5)
HCT: 41 % (ref 38.7–49.9)
HGB: 14.7 g/dL (ref 13.0–17.1)
LYMPH#: 2.6 10*3/uL (ref 0.9–3.3)
LYMPH%: 36.1 % (ref 14.0–48.0)
MCH: 34.5 pg — AB (ref 28.0–33.4)
MCHC: 35.9 g/dL (ref 32.0–35.9)
MCV: 96 fL (ref 82–98)
MONO#: 0.6 10*3/uL (ref 0.1–0.9)
MONO%: 8.1 % (ref 0.0–13.0)
NEUT%: 52.8 % (ref 40.0–80.0)
NEUTROS ABS: 3.9 10*3/uL (ref 1.5–6.5)
Platelets: 167 10*3/uL (ref 145–400)
RBC: 4.26 10*6/uL (ref 4.20–5.70)
RDW: 15.3 % (ref 11.1–15.7)
WBC: 7.3 10*3/uL (ref 4.0–10.0)

## 2014-12-02 LAB — COMPREHENSIVE METABOLIC PANEL
ALT: 30 U/L (ref 0–53)
AST: 26 U/L (ref 0–37)
Albumin: 4 g/dL (ref 3.5–5.2)
Alkaline Phosphatase: 90 U/L (ref 39–117)
BILIRUBIN TOTAL: 0.7 mg/dL (ref 0.2–1.2)
BUN: 16 mg/dL (ref 6–23)
CO2: 24 meq/L (ref 19–32)
Calcium: 9.1 mg/dL (ref 8.4–10.5)
Chloride: 102 mEq/L (ref 96–112)
Creatinine, Ser: 0.83 mg/dL (ref 0.50–1.35)
Glucose, Bld: 115 mg/dL — ABNORMAL HIGH (ref 70–99)
POTASSIUM: 4.2 meq/L (ref 3.5–5.3)
Sodium: 139 mEq/L (ref 135–145)
TOTAL PROTEIN: 6.8 g/dL (ref 6.0–8.3)

## 2014-12-02 LAB — CEA: CEA: 33.2 ng/mL — ABNORMAL HIGH (ref 0.0–5.0)

## 2014-12-02 MED ORDER — SODIUM CHLORIDE 0.9 % IV SOLN
Freq: Once | INTRAVENOUS | Status: AC
  Administered 2014-12-02: 10:00:00 via INTRAVENOUS
  Filled 2014-12-02: qty 4

## 2014-12-02 MED ORDER — DEXTROSE 5 % IV SOLN
Freq: Once | INTRAVENOUS | Status: AC
  Administered 2014-12-02: 10:00:00 via INTRAVENOUS

## 2014-12-02 MED ORDER — OXALIPLATIN CHEMO INJECTION 100 MG/20ML
130.0000 mg/m2 | Freq: Once | INTRAVENOUS | Status: AC
  Administered 2014-12-02: 265 mg via INTRAVENOUS
  Filled 2014-12-02: qty 53

## 2014-12-02 MED ORDER — HEPARIN SOD (PORK) LOCK FLUSH 100 UNIT/ML IV SOLN
500.0000 [IU] | Freq: Once | INTRAVENOUS | Status: AC | PRN
  Administered 2014-12-02: 500 [IU]
  Filled 2014-12-02: qty 5

## 2014-12-02 MED ORDER — SODIUM CHLORIDE 0.9 % IJ SOLN
10.0000 mL | INTRAMUSCULAR | Status: DC | PRN
  Administered 2014-12-02: 10 mL
  Filled 2014-12-02: qty 10

## 2014-12-02 NOTE — Patient Instructions (Signed)
Oxaliplatin Injection What is this medicine? OXALIPLATIN (ox AL i PLA tin) is a chemotherapy drug. It targets fast dividing cells, like cancer cells, and causes these cells to die. This medicine is used to treat cancers of the colon and rectum, and many other cancers. This medicine may be used for other purposes; ask your health care provider or pharmacist if you have questions. COMMON BRAND NAME(S): Eloxatin What should I tell my health care provider before I take this medicine? They need to know if you have any of these conditions: -kidney disease -an unusual or allergic reaction to oxaliplatin, other chemotherapy, other medicines, foods, dyes, or preservatives -pregnant or trying to get pregnant -breast-feeding How should I use this medicine? This drug is given as an infusion into a vein. It is administered in a hospital or clinic by a specially trained health care professional. Talk to your pediatrician regarding the use of this medicine in children. Special care may be needed. Overdosage: If you think you have taken too much of this medicine contact a poison control center or emergency room at once. NOTE: This medicine is only for you. Do not share this medicine with others. What if I miss a dose? It is important not to miss a dose. Call your doctor or health care professional if you are unable to keep an appointment. What may interact with this medicine? -medicines to increase blood counts like filgrastim, pegfilgrastim, sargramostim -probenecid -some antibiotics like amikacin, gentamicin, neomycin, polymyxin B, streptomycin, tobramycin -zalcitabine Talk to your doctor or health care professional before taking any of these medicines: -acetaminophen -aspirin -ibuprofen -ketoprofen -naproxen This list may not describe all possible interactions. Give your health care provider a list of all the medicines, herbs, non-prescription drugs, or dietary supplements you use. Also tell them if  you smoke, drink alcohol, or use illegal drugs. Some items may interact with your medicine. What should I watch for while using this medicine? Your condition will be monitored carefully while you are receiving this medicine. You will need important blood work done while you are taking this medicine. This medicine can make you more sensitive to cold. Do not drink cold drinks or use ice. Cover exposed skin before coming in contact with cold temperatures or cold objects. When out in cold weather wear warm clothing and cover your mouth and nose to warm the air that goes into your lungs. Tell your doctor if you get sensitive to the cold. This drug may make you feel generally unwell. This is not uncommon, as chemotherapy can affect healthy cells as well as cancer cells. Report any side effects. Continue your course of treatment even though you feel ill unless your doctor tells you to stop. In some cases, you may be given additional medicines to help with side effects. Follow all directions for their use. Call your doctor or health care professional for advice if you get a fever, chills or sore throat, or other symptoms of a cold or flu. Do not treat yourself. This drug decreases your body's ability to fight infections. Try to avoid being around people who are sick. This medicine may increase your risk to bruise or bleed. Call your doctor or health care professional if you notice any unusual bleeding. Be careful brushing and flossing your teeth or using a toothpick because you may get an infection or bleed more easily. If you have any dental work done, tell your dentist you are receiving this medicine. Avoid taking products that contain aspirin, acetaminophen, ibuprofen, naproxen,   or ketoprofen unless instructed by your doctor. These medicines may hide a fever. Do not become pregnant while taking this medicine. Women should inform their doctor if they wish to become pregnant or think they might be pregnant. There  is a potential for serious side effects to an unborn child. Talk to your health care professional or pharmacist for more information. Do not breast-feed an infant while taking this medicine. Call your doctor or health care professional if you get diarrhea. Do not treat yourself. What side effects may I notice from receiving this medicine? Side effects that you should report to your doctor or health care professional as soon as possible: -allergic reactions like skin rash, itching or hives, swelling of the face, lips, or tongue -low blood counts - This drug may decrease the number of white blood cells, red blood cells and platelets. You may be at increased risk for infections and bleeding. -signs of infection - fever or chills, cough, sore throat, pain or difficulty passing urine -signs of decreased platelets or bleeding - bruising, pinpoint red spots on the skin, black, tarry stools, nosebleeds -signs of decreased red blood cells - unusually weak or tired, fainting spells, lightheadedness -breathing problems -chest pain, pressure -cough -diarrhea -jaw tightness -mouth sores -nausea and vomiting -pain, swelling, redness or irritation at the injection site -pain, tingling, numbness in the hands or feet -problems with balance, talking, walking -redness, blistering, peeling or loosening of the skin, including inside the mouth -trouble passing urine or change in the amount of urine Side effects that usually do not require medical attention (report to your doctor or health care professional if they continue or are bothersome): -changes in vision -constipation -hair loss -loss of appetite -metallic taste in the mouth or changes in taste -stomach pain This list may not describe all possible side effects. Call your doctor for medical advice about side effects. You may report side effects to FDA at 1-800-FDA-1088. Where should I keep my medicine? This drug is given in a hospital or clinic and  will not be stored at home. NOTE: This sheet is a summary. It may not cover all possible information. If you have questions about this medicine, talk to your doctor, pharmacist, or health care provider.  2015, [employer] Standard. (2007-12-15 17:22:47)
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
## 2014-12-06 ENCOUNTER — Telehealth: Payer: Self-pay | Admitting: Hematology & Oncology

## 2014-12-06 ENCOUNTER — Ambulatory Visit: Payer: BLUE CROSS/BLUE SHIELD | Admitting: Hematology & Oncology

## 2014-12-06 NOTE — Telephone Encounter (Signed)
Lt mess regarding upcoming appt on 7/7

## 2014-12-07 ENCOUNTER — Telehealth: Payer: Self-pay | Admitting: *Deleted

## 2014-12-07 DIAGNOSIS — C787 Secondary malignant neoplasm of liver and intrahepatic bile duct: Principal | ICD-10-CM

## 2014-12-07 DIAGNOSIS — C189 Malignant neoplasm of colon, unspecified: Secondary | ICD-10-CM

## 2014-12-07 NOTE — Telephone Encounter (Signed)
Patient's wife stating that patient is feeling poorly. He has no energy. His calves are cramping; his jaw is locking at times when he's eating and he's staying home from work. 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

## 2014-12-08 ENCOUNTER — Ambulatory Visit: Payer: BLUE CROSS/BLUE SHIELD | Admitting: Hematology & Oncology

## 2014-12-08 ENCOUNTER — Other Ambulatory Visit: Payer: BLUE CROSS/BLUE SHIELD

## 2014-12-08 ENCOUNTER — Ambulatory Visit
Admission: RE | Admit: 2014-12-08 | Discharge: 2014-12-08 | Disposition: A | Discharge status: Home / Self Care | Payer: BLUE CROSS/BLUE SHIELD | Source: Ambulatory Visit | Attending: Diagnostic Radiology | Admitting: Diagnostic Radiology

## 2014-12-08 DIAGNOSIS — C189 Malignant neoplasm of colon, unspecified: Secondary | ICD-10-CM

## 2014-12-08 DIAGNOSIS — C787 Secondary malignant neoplasm of liver and intrahepatic bile duct: Secondary | ICD-10-CM

## 2014-12-08 MED ORDER — VENLAFAXINE HCL ER 37.5 MG PO CP24: 37.5000 mg | ORAL_CAPSULE | Freq: Every day | ORAL | Status: DC
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
## 2014-12-09 ENCOUNTER — Other Ambulatory Visit (HOSPITAL_COMMUNITY): Payer: Self-pay | Admitting: Diagnostic Radiology

## 2014-12-09 DIAGNOSIS — C787 Secondary malignant neoplasm of liver and intrahepatic bile duct: Principal | ICD-10-CM

## 2014-12-09 DIAGNOSIS — C189 Malignant neoplasm of colon, unspecified: Secondary | ICD-10-CM

## 2014-12-12 ENCOUNTER — Other Ambulatory Visit (HOSPITAL_COMMUNITY): Payer: Self-pay | Admitting: Diagnostic Radiology

## 2014-12-12 DIAGNOSIS — C189 Malignant neoplasm of colon, unspecified: Secondary | ICD-10-CM

## 2014-12-12 DIAGNOSIS — C787 Secondary malignant neoplasm of liver and intrahepatic bile duct: Secondary | ICD-10-CM

## 2014-12-13 ENCOUNTER — Other Ambulatory Visit (HOSPITAL_COMMUNITY): Payer: Self-pay | Admitting: Diagnostic Radiology

## 2014-12-13 DIAGNOSIS — C787 Secondary malignant neoplasm of liver and intrahepatic bile duct: Secondary | ICD-10-CM

## 2014-12-13 DIAGNOSIS — C189 Malignant neoplasm of colon, unspecified: Secondary | ICD-10-CM

## 2014-12-19 ENCOUNTER — Other Ambulatory Visit: Payer: Self-pay | Admitting: *Deleted

## 2015-01-09 ENCOUNTER — Ambulatory Visit (HOSPITAL_COMMUNITY)
Admission: RE | Admit: 2015-01-09 | Discharge: 2015-01-09 | Disposition: A | Discharge status: Home / Self Care | Payer: BLUE CROSS/BLUE SHIELD | Source: Ambulatory Visit | Attending: Diagnostic Radiology | Admitting: Diagnostic Radiology

## 2015-01-09 ENCOUNTER — Encounter (HOSPITAL_COMMUNITY): Payer: Self-pay

## 2015-01-09 DIAGNOSIS — C189 Malignant neoplasm of colon, unspecified: Secondary | ICD-10-CM | POA: Diagnosis present

## 2015-01-09 DIAGNOSIS — C787 Secondary malignant neoplasm of liver and intrahepatic bile duct: Secondary | ICD-10-CM | POA: Insufficient documentation

## 2015-01-09 DIAGNOSIS — Z9221 Personal history of antineoplastic chemotherapy: Secondary | ICD-10-CM | POA: Diagnosis not present

## 2015-01-09 DIAGNOSIS — C7951 Secondary malignant neoplasm of bone: Secondary | ICD-10-CM | POA: Insufficient documentation

## 2015-01-09 MED ORDER — IOHEXOL 350 MG/ML SOLN
100.0000 mL | Freq: Once | INTRAVENOUS | Status: AC | PRN
  Administered 2015-01-09: 100 mL via INTRAVENOUS

## 2015-01-11 ENCOUNTER — Other Ambulatory Visit: Payer: Self-pay | Admitting: Radiology

## 2015-01-12 ENCOUNTER — Ambulatory Visit (HOSPITAL_COMMUNITY)
Admission: RE | Admit: 2015-01-12 | Discharge: 2015-01-12 | Disposition: A | Discharge status: Home / Self Care | Payer: BLUE CROSS/BLUE SHIELD | Source: Ambulatory Visit | Attending: Diagnostic Radiology | Admitting: Diagnostic Radiology

## 2015-01-12 ENCOUNTER — Other Ambulatory Visit (HOSPITAL_COMMUNITY): Payer: Self-pay | Admitting: Diagnostic Radiology

## 2015-01-12 ENCOUNTER — Encounter (HOSPITAL_COMMUNITY): Payer: Self-pay

## 2015-01-12 DIAGNOSIS — C787 Secondary malignant neoplasm of liver and intrahepatic bile duct: Secondary | ICD-10-CM

## 2015-01-12 DIAGNOSIS — Z85038 Personal history of other malignant neoplasm of large intestine: Secondary | ICD-10-CM | POA: Diagnosis not present

## 2015-01-12 DIAGNOSIS — Z9221 Personal history of antineoplastic chemotherapy: Secondary | ICD-10-CM | POA: Insufficient documentation

## 2015-01-12 DIAGNOSIS — C189 Malignant neoplasm of colon, unspecified: Secondary | ICD-10-CM

## 2015-01-12 LAB — HEPATIC FUNCTION PANEL
ALT: 19 U/L (ref 17–63)
AST: 31 U/L (ref 15–41)
Albumin: 4.1 g/dL (ref 3.5–5.0)
Alkaline Phosphatase: 73 U/L (ref 38–126)
Bilirubin, Direct: <0.1 mg/dL — ABNORMAL LOW (ref 0.1–0.5)
TOTAL PROTEIN: 7.5 g/dL (ref 6.5–8.1)
Total Bilirubin: 1 mg/dL (ref 0.3–1.2)

## 2015-01-12 LAB — PROTIME-INR
INR: 1.03 (ref 0.00–1.49)
Prothrombin Time: 13.7 seconds (ref 11.6–15.2)

## 2015-01-12 LAB — CBC
HCT: 41.9 % (ref 39.0–52.0)
HEMOGLOBIN: 14.7 g/dL (ref 13.0–17.0)
MCH: 34.5 pg — AB (ref 26.0–34.0)
MCHC: 35.1 g/dL (ref 30.0–36.0)
MCV: 98.4 fL (ref 78.0–100.0)
Platelets: 199 10*3/uL (ref 150–400)
RBC: 4.26 MIL/uL (ref 4.22–5.81)
RDW: 14.6 % (ref 11.5–15.5)
WBC: 6.5 10*3/uL (ref 4.0–10.5)

## 2015-01-12 LAB — BASIC METABOLIC PANEL
ANION GAP: 9 (ref 5–15)
BUN: 14 mg/dL (ref 6–20)
CO2: 24 mmol/L (ref 22–32)
Calcium: 9.3 mg/dL (ref 8.9–10.3)
Chloride: 103 mmol/L (ref 101–111)
Creatinine, Ser: 0.92 mg/dL (ref 0.61–1.24)
GFR calc Af Amer: >60 mL/min (ref >60)
GLUCOSE: 105 mg/dL — AB (ref 65–99)
POTASSIUM: 3.8 mmol/L (ref 3.5–5.1)
Sodium: 136 mmol/L (ref 135–145)

## 2015-01-12 LAB — APTT: aPTT: 30 seconds (ref 24–37)

## 2015-01-12 MED ORDER — FENTANYL CITRATE (PF) 100 MCG/2ML IJ SOLN
INTRAMUSCULAR | Status: AC
  Filled 2015-01-12: qty 4

## 2015-01-12 MED ORDER — MIDAZOLAM HCL 2 MG/2ML IJ SOLN
INTRAMUSCULAR | Status: AC
  Filled 2015-01-12: qty 6

## 2015-01-12 MED ORDER — MIDAZOLAM HCL 2 MG/2ML IJ SOLN
INTRAMUSCULAR | Status: AC | PRN
  Administered 2015-01-12 (×4): 0.5 mg via INTRAVENOUS
  Administered 2015-01-12: 1 mg via INTRAVENOUS
  Administered 2015-01-12 (×4): 0.5 mg via INTRAVENOUS

## 2015-01-12 MED ORDER — TECHNETIUM TO 99M ALBUMIN AGGREGATED: 5.3000 | Freq: Once | INTRAVENOUS | Status: DC | PRN

## 2015-01-12 MED ORDER — CEFAZOLIN SODIUM-DEXTROSE 2-3 GM-% IV SOLR
2.0000 g | Freq: Once | INTRAVENOUS | Status: AC
  Administered 2015-01-12: 2 g via INTRAVENOUS

## 2015-01-12 MED ORDER — SODIUM CHLORIDE 0.9 % IV SOLN: INTRAVENOUS | Status: DC

## 2015-01-12 MED ORDER — CEFAZOLIN SODIUM-DEXTROSE 2-3 GM-% IV SOLR
INTRAVENOUS | Status: AC
  Filled 2015-01-12: qty 50

## 2015-01-12 MED ORDER — FENTANYL CITRATE (PF) 100 MCG/2ML IJ SOLN
INTRAMUSCULAR | Status: AC | PRN
  Administered 2015-01-12: 25 ug via INTRAVENOUS
  Administered 2015-01-12: 50 ug via INTRAVENOUS
  Administered 2015-01-12 (×2): 25 ug via INTRAVENOUS

## 2015-01-12 MED ORDER — SODIUM CHLORIDE 0.9 % IV SOLN
Freq: Once | INTRAVENOUS | Status: AC
  Administered 2015-01-12: 08:00:00 via INTRAVENOUS

## 2015-01-12 MED ORDER — LIDOCAINE HCL 1 % IJ SOLN
INTRAMUSCULAR | Status: AC
  Filled 2015-01-12: qty 20

## 2015-01-12 MED ORDER — IOHEXOL 300 MG/ML  SOLN
85.0000 mL | Freq: Once | INTRAMUSCULAR | Status: DC | PRN
  Administered 2015-01-12: 85 mL via INTRAVENOUS
  Filled 2015-01-12: qty 90

## 2015-01-12 NOTE — Progress Notes (Signed)
Patient ID: Ryan Maynard, male   DOB: April 10, 1951, 64 y.o.   MRN: [id number]    Referring Physician(s): Ennever,P  Chief Complaint:  Metastatic colon cancer to liver   Subjective: Patient tolerated to IR service from prior hepatic microwave ablation in March 2016. He has known metastatic colon cancer to the liver and recent PET scan demonstrating progressive hepatic metastasis. He presents today following recent follow-up exam for pre-Y 90 hepatic arteriography with possible embolization/Y 90 test dosing. He is currently asymptomatic with exception of occasional dry cough.    Allergies: Bactrim and Lyrica  Medications: Prior to Admission medications   Medication Sig Start Date End Date Taking? Authorizing Provider  amLODipine-benazepril (LOTREL) 5-20 MG per capsule Take 1 capsule by mouth daily. 09/05/14  Yes Volanda Napoleon, MD  capecitabine (XELODA) 500 MG tablet Take 3 tablets (1,500 mg total) by mouth 2 (two) times daily after a meal. Take for 14 days, off 7 days. 07/27/14  Yes Volanda Napoleon, MD  Coenzyme Q10 10 MG capsule Take 10 mg by mouth daily.   Yes Historical Provider, MD  gabapentin (NEURONTIN) 100 MG capsule Take 1 capsule (100 mg total) by mouth 3 (three) times daily. Pt okay to take only once a day Patient taking differently: Take 100 mg by mouth daily. TAKES 1 DAILY 03/02/14  Yes Volanda Napoleon, MD  ibuprofen (ADVIL,MOTRIN) 200 MG tablet Take 200-400 mg by mouth every 6 (six) hours as needed.   Yes Historical Provider, MD  LORazepam (ATIVAN) 0.5 MG tablet Take 1 tablet (0.5 mg total) by mouth as needed (Nausea or vomiting). 05/11/14  Yes Volanda Napoleon, MD  oxymetazoline (AFRIN) 0.05 % nasal spray Place 1 spray into both nostrils 2 (two) times daily as needed for congestion.   Yes Historical Provider, MD  lidocaine-prilocaine (EMLA) cream Apply 1 application topically as needed. Apply quarter sized amount to portacath site 1-2 hours prior to chemotherapy appt.  Cover with saran  wrap. Patient not taking: Reported on 01/12/2015 06/23/13   Volanda Napoleon, MD  venlafaxine XR (EFFEXOR-XR) 37.5 MG 24 hr capsule Take 1 capsule (37.5 mg total) by mouth daily with breakfast. Patient not taking: Reported on 01/12/2015 12/08/14   Volanda Napoleon, MD     Vital Signs: BP 145/85 mmHg  Pulse 74  Temp(Src) 98.1 F (36.7 C) (Oral)  Resp 18  SpO2 100%  Physical Exam patient awake, alert. Chest clear to auscultation bilaterally. Heart with regular rate and rhythm. Abdomen soft, protrusion, positive bowel sounds, nontender. Extremities with trace bilateral pretibial edema.  Imaging: Ct Angio Abdomen W/cm &/or Wo Contrast  01/09/2015   CLINICAL DATA:  Metastatic colorectal cancer to the liver with prior chemotherapy as well as thermal ablation of right hepatic metastases. There has been progression of multilobar metastatic disease in the liver since prior treatment and CT angiography is performed to define hepatic arterial anatomy prior to planned Yttrium 90 radioembolization.  EXAM: CT ANGIOGRAPHY ABDOMEN  TECHNIQUE: Multidetector CT imaging of the abdomen was performed using the standard protocol during bolus administration of intravenous contrast. Multiplanar reconstructed images including MIPs were obtained and reviewed to evaluate the vascular anatomy.  CONTRAST:  149mL OMNIPAQUE IOHEXOL 350 MG/ML SOLN  COMPARISON:  PET scan on 11/23/2014  FINDINGS: The abdominal aorta shows normal patency and no evidence of aneurysmal disease or stenosis. Hepatic arterial supply shows classic pattern off of the celiac axis. The left gastric artery originates off of the top of the celiac trunk and shows  no accessory supply to the liver. The celiac trunk than bifurcates into a common hepatic artery and splenic artery. The common hepatic artery supplies a normally positioned gastroduodenal artery. The proper hepatic artery supplies the liver with a single left hepatic identify identified off of the proper  hepatic artery as well as a single right hepatic artery. The cystic artery originates from the right hepatic artery. A right gastric artery is not visualized.  The superior mesenteric artery shows normal patency and supplies typical branches to the small bowel and proximal colon. Mild plaque is present in the SMA trunk without evidence of significant stenosis. The inferior mesenteric artery is normally patent. Bilateral single renal arteries are present. Minimal plaque in both proximal renal artery segments present without significant stenosis identified.  Venous phase shows normal patency of the splenic vein, portal vein and renal veins. No portal venous thrombus is identified in the liver. Superior mesenteric vein is widely patent.  The liver demonstrates multiple treated calcified metastatic lesions as well as a peripheral right hepatic ablation defect. There are some small noncalcified lesions in both the left and right lobes corresponding to areas of hypermetabolic activity by PET scan. Previously described L2 vertebral body metastasis is difficult to currently visualize. No new bony lesions are seen.  Review of the MIP images confirms the above findings.  IMPRESSION: 1. Normal patency of aorta and branch vessels with classic hepatic arterial branching pattern. No variation of arterial anatomy is identified. Right and left hepatic arteries are normally patent. 2. No evidence of venous thrombosis. 3. Multiple calcified and noncalcified metastatic lesions are identified again in the liver as well as a peripheral ablation defect. 4. Currently difficult to determine if there is residual metastatic disease in the L2 vertebral body.   Electronically Signed   By: Aletta Edouard M.D.   On: 01/09/2015 10:47    Labs:  CBC:  Recent Labs  10/12/14 0802 11/09/14 0823 12/02/14 0816 01/12/15 0758  WBC 5.4 5.7 7.3 6.5  HGB 15.8 15.9 14.7 14.7  HCT 44.4 44.1 41.0 41.9  PLT 182 173 167 199    COAGS:  Recent  Labs  08/23/14 1420 01/12/15 0758  INR 0.99 1.03  APTT 31 30    BMP:  Recent Labs  08/23/14 1420 08/27/14 0405  10/12/14 0802 11/09/14 0824 12/02/14 0817 01/12/15 0758  NA 136 137  < > 141 140 139 136  K 3.7 3.6  < > 4.0 4.2 4.2 3.8  CL 103 102  < > 104 106 102 103  CO2 24 26  < > 27 27 24 24   GLUCOSE 113* 111*  < > 103 104 115* 105*  BUN 15 12  < > 15 12 16 14   CALCIUM 9.2 8.6  < > 9.4 9.4 9.1 9.3  CREATININE 0.85 0.86  < > 0.9 1.2 0.83 0.92  GFRNONAA 89* 89*  --   --   --   --  >60  GFRAA >90 >90  --   --   --   --  >60  < > = values in this interval not displayed.  LIVER FUNCTION TESTS:  Recent Labs  08/23/14 1420 08/27/14 0405  09/21/14 0807 10/12/14 0802 11/09/14 0824 12/02/14 0817  BILITOT 0.9 1.8*  < > 1.00 1.20 1.40 0.7  AST 34 313*  < > 35 34 36 26  ALT 29 232*  < > 33 32 31 30  ALKPHOS 86 81  < > 97* 80 75 90  PROT 8.0 7.1  < > 8.1 7.7 7.6 6.8  ALBUMIN 4.5 3.9  --   --   --   --  4.0  < > = values in this interval not displayed.  Assessment and Plan:  Patient with history of metastatic colon cancer to the liver status post prior hepatic microwave ablation in March of this year. Now with evidence of disease progression on PET scan. He presents today for pre-Y 90 hepatic/visceral arteriography with possible embolization and Y 90 test dosing. Details/risks of procedure, including but not limited to, internal bleeding, infection, renal injury, radiation exposure discussed with patient and wife with their understanding and consent.  Signed: D. Rowe Robert 01/12/2015, 8:56 AM   I spent a total of 25 in face to face in clinical consultation/evaluation, greater than 50% of which was counseling/coordinating care for pre-Y 90 hepatic/visceral arteriography with possible embolization/Y 90 test dosing

## 2015-01-12 NOTE — Procedures (Signed)
Interventional Radiology Procedure Note  Procedure:  Hepatic, GDA, right gastric and cystic arteriography.  Embolization of GDA, right gastric artery and cystic artery.  Injection of MAA into right hepatic artery.  Complications:  None  Estimated Blood Loss: <25 mL  Embolized GDA successfully.  Right gastric artery also supplying duodenum also embolized.  Cystic artery partially embolized. See full dictated procedure note in Imaging.  Venetia Night. Kathlene Cote, M.D Pager:  [phone]

## 2015-01-12 NOTE — Discharge Instructions (Signed)
Conscious Sedation Sedation is the use of medicines to promote relaxation and relieve discomfort and anxiety. Conscious sedation is a type of sedation. Under conscious sedation you are less alert than normal but are still able to respond to instructions or stimulation. Conscious sedation is used during short medical and dental procedures. It is milder than deep sedation or general anesthesia and allows you to return to your regular activities sooner.  LET [facility] CARE PROVIDER KNOW ABOUT:   Any allergies you have.  All medicines you are taking, including vitamins, herbs, eye drops, creams, and over-the-counter medicines.  Use of steroids (by mouth or creams).  Previous problems you or members of your family have had with the use of anesthetics.  Any blood disorders you have.  Previous surgeries you have had.  Medical conditions you have.  Possibility of pregnancy, if this applies.  Use of cigarettes, alcohol, or illegal drugs. RISKS AND COMPLICATIONS Generally, this is a safe procedure. However, as with any procedure, problems can occur. Possible problems include:  Oversedation.  Trouble breathing on your own. You may need to have a breathing tube until you are awake and breathing on your own.  Allergic reaction to any of the medicines used for the procedure. BEFORE THE PROCEDURE  You may have blood tests done. These tests can help show how well your kidneys and liver are working. They can also show how well your blood clots.  A physical exam will be done.  Only take medicines as directed by your health care provider. You may need to stop taking medicines (such as blood thinners, aspirin, or nonsteroidal anti-inflammatory drugs) before the procedure.   Do not eat or drink at least 6 hours before the procedure or as directed by your health care provider.  Arrange for a responsible adult, family member, or friend to take you home after the procedure. He or she should stay  with you for at least 24 hours after the procedure, until the medicine has worn off. PROCEDURE   An intravenous (IV) catheter will be inserted into one of your veins. Medicine will be able to flow directly into your body through this catheter. You may be given medicine through this tube to help prevent pain and help you relax.  The medical or dental procedure will be done. AFTER THE PROCEDURE  You will stay in a recovery area until the medicine has worn off. Your blood pressure and pulse will be checked.   Depending on the procedure you had, you may be allowed to go home when you can tolerate liquids and your pain is under control. Document Released: 02/12/2001 Document Revised: 05/25/2013 Document Reviewed: 01/25/2013 [facility] Patient Information 2015 [place], [place]. This information is not intended to replace advice given to you by your health care provider. Make sure you discuss any questions you have with your health care provider. Post Y-90 Radioembolization Discharge Instructions  You have been given a radioactive material during your procedure.  While it is safe for you to be discharged home from the hospital, you need to proceed directly home.    Do not use public transportation, including air travel, lasting more than 2 hours for 1 week.  Avoid crowded public places for 1 week.  Adult visitors should try to avoid close contact with you for 1 week.    Children and pregnant females should not visit or have close contact with you for 1 week.  Items that you touch are not radioactive.  Do not sleep in  the same bed as your partner for 1 week, and a condom should be used for sexual activity during the first 24 hours.  Your blood may be radioactive and caution should be used if any bleeding occurs during the recovery period.  Body fluids may be radioactive for 24 hours.  Wash your hands after voiding.  Men should sit to urinate.  Dispose of any soiled materials (flush down toilet  or place in trash at home) during the first day.  Drink 6 to 8 glasses of fluids per day for 5 days to hydrate yourself.  If you need to see a doctor during the first week, you must let them know that you were treated with yttrium-90 microspheres, and will be slightly radioactive.  They can call Interventional Radiology [phone] with any questions.Arteriogram Care After These instructions give you information on caring for yourself after your procedure. Your doctor may also give you more specific instructions. Call your doctor if you have any problems or questions after your procedure. HOME CARE  Keep your leg straight for at least 6 hours.  Do not bathe, swim, or use a hot tub until directed by your doctor. You can shower.  Do not lift anything heavier than 10 pounds (about a gallon of milk) for 2 days.  Do not walk a lot, run, or drive for 2 days.  Return to normal activities in 2 days or as told by your doctor. Finding out the results of your test Ask when your test results will be ready. Make sure you get your test results. GET HELP RIGHT AWAY IF:   You have fever.  You have more pain in your leg.  The leg that was cut is:  Bleeding.  Puffy (swollen) or red.  Cold.  Pale or changes color.  Weak.  Tingly or numb. If you go to the Emergency Room, tell your nurse that you have had an arteriogram. Take this paper with you to show the nurse. MAKE SURE YOU:  Understand these instructions.  Will watch your condition.  Will get help right away if you are not doing well or get worse. Document Released: 08/16/2008 Document Revised: 05/25/2013 Document Reviewed: 08/16/2008 [facility] Patient Information 2015 [place], [place]. This information is not intended to replace advice given to you by your health care provider. Make sure you discuss any questions you have with your health care provider. Conscious Sedation, Adult, Care After Refer to this sheet in the next few weeks.  These instructions provide you with information on caring for yourself after your procedure. Your health care provider may also give you more specific instructions. Your treatment has been planned according to current medical practices, but problems sometimes occur. Call your health care provider if you have any problems or questions after your procedure. WHAT TO EXPECT AFTER THE PROCEDURE  After your procedure: You may feel sleepy, clumsy, and have poor balance for several hours. Vomiting may occur if you eat too soon after the procedure. HOME CARE INSTRUCTIONS Do not participate in any activities where you could become injured for at least 24 hours. Do not: Drive. Swim. Ride a bicycle. Operate heavy machinery. Cook. Use power tools. Climb ladders. Work from a high place. Do not make important decisions or sign legal documents until you are improved. If you vomit, drink water, juice, or soup when you can drink without vomiting. Make sure you have little or no nausea before eating solid foods. Only take over-the-counter or prescription medicines for pain, discomfort, or  fever as directed by your health care provider. Make sure you and your family fully understand everything about the medicines given to you, including what side effects may occur. You should not drink alcohol, take sleeping pills, or take medicines that cause drowsiness for at least 24 hours. If you smoke, do not smoke without supervision. If you are feeling better, you may resume normal activities 24 hours after you were sedated. Keep all appointments with your health care provider. SEEK MEDICAL CARE IF: Your skin is pale or bluish in color. You continue to feel nauseous or vomit. Your pain is getting worse and is not helped by medicine. You have bleeding or swelling. You are still sleepy or feeling clumsy after 24 hours. SEEK IMMEDIATE MEDICAL CARE IF: You develop a rash. You have difficulty breathing. You develop any  type of allergic problem. You have a fever. MAKE SURE YOU: Understand these instructions. Will watch your condition. Will get help right away if you are not doing well or get worse. Document Released: 03/10/2013 Document Reviewed: 03/10/2013 [facility] Patient Information 2015 [place], [place]. This information is not intended to replace advice given to you by your health care provider. Make sure you discuss any questions you have with your health care provider.

## 2015-01-12 NOTE — Sedation Documentation (Signed)
5Fr sheath removed from R fem artery by Dr. Kathlene Cote. Hemostasis achieved used exoseal closure device and manual pressure held for 2 minutes by Lambert Mody, RTR. Groin level 0, RDP +3.

## 2015-01-16 ENCOUNTER — Telehealth: Payer: Self-pay

## 2015-01-16 NOTE — Telephone Encounter (Signed)
Received call from pt reporting he is scheduled to begin Xeloda this week and is also scheduled for Y90 procedure on 8/25. Questions if he should take the Xeloda or hold it.   Per Dr Marin Olp, hold Xeloda for this cycle. Pt verbalizes understanding and repeats instructions. dph

## 2015-01-18 ENCOUNTER — Encounter: Payer: Self-pay | Admitting: Nurse Practitioner

## 2015-01-18 NOTE — Progress Notes (Signed)
[employer] Medical Necessity Form faxed back to specified personnel. [phone] and confirmation received.

## 2015-01-25 ENCOUNTER — Other Ambulatory Visit: Payer: Self-pay | Admitting: Radiology

## 2015-01-25 ENCOUNTER — Ambulatory Visit: Payer: BLUE CROSS/BLUE SHIELD | Admitting: Family

## 2015-01-25 ENCOUNTER — Other Ambulatory Visit: Payer: BLUE CROSS/BLUE SHIELD | Admitting: Lab

## 2015-01-26 ENCOUNTER — Ambulatory Visit (HOSPITAL_COMMUNITY)
Admission: RE | Admit: 2015-01-26 | Discharge: 2015-01-26 | Disposition: A | Discharge status: Home / Self Care | Payer: BLUE CROSS/BLUE SHIELD | Source: Ambulatory Visit | Attending: Diagnostic Radiology | Admitting: Diagnostic Radiology

## 2015-01-26 ENCOUNTER — Other Ambulatory Visit: Payer: Self-pay | Admitting: *Deleted

## 2015-01-26 ENCOUNTER — Other Ambulatory Visit (HOSPITAL_COMMUNITY): Payer: Self-pay | Admitting: Interventional Radiology

## 2015-01-26 ENCOUNTER — Encounter (HOSPITAL_COMMUNITY): Payer: Self-pay

## 2015-01-26 ENCOUNTER — Other Ambulatory Visit (HOSPITAL_COMMUNITY): Payer: Self-pay | Admitting: Diagnostic Radiology

## 2015-01-26 DIAGNOSIS — C787 Secondary malignant neoplasm of liver and intrahepatic bile duct: Secondary | ICD-10-CM

## 2015-01-26 DIAGNOSIS — C189 Malignant neoplasm of colon, unspecified: Secondary | ICD-10-CM

## 2015-01-26 DIAGNOSIS — C220 Liver cell carcinoma: Secondary | ICD-10-CM

## 2015-01-26 LAB — CBC WITH DIFFERENTIAL/PLATELET
BASOS PCT: 1 % (ref 0–1)
Basophils Absolute: 0.1 10*3/uL (ref 0.0–0.1)
EOS ABS: 0.1 10*3/uL (ref 0.0–0.7)
Eosinophils Relative: 2 % (ref 0–5)
HCT: 42.3 % (ref 39.0–52.0)
HEMOGLOBIN: 14.6 g/dL (ref 13.0–17.0)
Lymphocytes Relative: 29 % (ref 12–46)
Lymphs Abs: 1.7 10*3/uL (ref 0.7–4.0)
MCH: 34.2 pg — ABNORMAL HIGH (ref 26.0–34.0)
MCHC: 34.5 g/dL (ref 30.0–36.0)
MCV: 99.1 fL (ref 78.0–100.0)
MONOS PCT: 11 % (ref 3–12)
Monocytes Absolute: 0.6 10*3/uL (ref 0.1–1.0)
NEUTROS PCT: 57 % (ref 43–77)
Neutro Abs: 3.3 10*3/uL (ref 1.7–7.7)
PLATELETS: 179 10*3/uL (ref 150–400)
RBC: 4.27 MIL/uL (ref 4.22–5.81)
RDW: 14 % (ref 11.5–15.5)
WBC: 5.8 10*3/uL (ref 4.0–10.5)

## 2015-01-26 LAB — COMPREHENSIVE METABOLIC PANEL
ALBUMIN: 4.2 g/dL (ref 3.5–5.0)
ALK PHOS: 98 U/L (ref 38–126)
ALT: 24 U/L (ref 17–63)
ANION GAP: 7 (ref 5–15)
AST: 31 U/L (ref 15–41)
BUN: 14 mg/dL (ref 6–20)
CHLORIDE: 104 mmol/L (ref 101–111)
CO2: 26 mmol/L (ref 22–32)
Calcium: 9.3 mg/dL (ref 8.9–10.3)
Creatinine, Ser: 0.82 mg/dL (ref 0.61–1.24)
GFR calc Af Amer: >60 mL/min (ref >60)
GFR calc non Af Amer: >60 mL/min (ref >60)
GLUCOSE: 102 mg/dL — AB (ref 65–99)
POTASSIUM: 4.1 mmol/L (ref 3.5–5.1)
SODIUM: 137 mmol/L (ref 135–145)
Total Bilirubin: 1 mg/dL (ref 0.3–1.2)
Total Protein: 8 g/dL (ref 6.5–8.1)

## 2015-01-26 LAB — PROTIME-INR
INR: 1.01 (ref 0.00–1.49)
Prothrombin Time: 13.5 seconds (ref 11.6–15.2)

## 2015-01-26 MED ORDER — ONDANSETRON HCL 4 MG/2ML IJ SOLN
4.0000 mg | Freq: Once | INTRAMUSCULAR | Status: AC
  Administered 2015-01-26: 4 mg via INTRAVENOUS
  Filled 2015-01-26: qty 2

## 2015-01-26 MED ORDER — NITROGLYCERIN 1 MG/10 ML FOR IR/CATH LAB
INTRA_ARTERIAL | Status: AC | PRN
  Administered 2015-01-26: 100 ug via INTRA_ARTERIAL

## 2015-01-26 MED ORDER — FENTANYL CITRATE (PF) 100 MCG/2ML IJ SOLN
INTRAMUSCULAR | Status: AC
  Filled 2015-01-26: qty 2

## 2015-01-26 MED ORDER — LIDOCAINE HCL 1 % IJ SOLN
INTRAMUSCULAR | Status: AC
  Filled 2015-01-26: qty 20

## 2015-01-26 MED ORDER — MIDAZOLAM HCL 2 MG/2ML IJ SOLN
INTRAMUSCULAR | Status: AC
  Filled 2015-01-26: qty 4

## 2015-01-26 MED ORDER — MIDAZOLAM HCL 2 MG/2ML IJ SOLN
INTRAMUSCULAR | Status: AC | PRN
  Administered 2015-01-26 (×7): 0.5 mg via INTRAVENOUS
  Administered 2015-01-26: 1 mg via INTRAVENOUS
  Administered 2015-01-26: 0.5 mg via INTRAVENOUS

## 2015-01-26 MED ORDER — DEXAMETHASONE SODIUM PHOSPHATE 10 MG/ML IJ SOLN
10.0000 mg | Freq: Once | INTRAMUSCULAR | Status: AC
  Administered 2015-01-26: 10 mg via INTRAVENOUS
  Filled 2015-01-26: qty 1

## 2015-01-26 MED ORDER — HYDROCODONE-ACETAMINOPHEN 5-325 MG PO TABS: 1.0000 | ORAL_TABLET | ORAL | Status: DC | PRN

## 2015-01-26 MED ORDER — FENTANYL CITRATE (PF) 100 MCG/2ML IJ SOLN
INTRAMUSCULAR | Status: AC | PRN
  Administered 2015-01-26: 50 ug via INTRAVENOUS
  Administered 2015-01-26 (×2): 25 ug via INTRAVENOUS

## 2015-01-26 MED ORDER — IOHEXOL 300 MG/ML  SOLN
100.0000 mL | Freq: Once | INTRAMUSCULAR | Status: DC | PRN
  Administered 2015-01-26: 100 mL via INTRA_ARTERIAL
  Filled 2015-01-26: qty 100

## 2015-01-26 MED ORDER — SODIUM CHLORIDE 0.9 % IV SOLN
8.0000 mg | Freq: Once | INTRAVENOUS | Status: DC
  Filled 2015-01-26: qty 4

## 2015-01-26 MED ORDER — PANTOPRAZOLE SODIUM 40 MG IV SOLR
40.0000 mg | Freq: Once | INTRAVENOUS | Status: AC
  Administered 2015-01-26: 40 mg via INTRAVENOUS
  Filled 2015-01-26: qty 40

## 2015-01-26 MED ORDER — MIDAZOLAM HCL 2 MG/2ML IJ SOLN
INTRAMUSCULAR | Status: AC
  Filled 2015-01-26: qty 2

## 2015-01-26 MED ORDER — ALPRAZOLAM 0.5 MG PO TABS: 0.5000 mg | ORAL_TABLET | Freq: Two times a day (BID) | ORAL | Status: DC | PRN

## 2015-01-26 MED ORDER — SODIUM CHLORIDE 0.9 % IV SOLN: INTRAVENOUS | Status: DC

## 2015-01-26 MED ORDER — PIPERACILLIN-TAZOBACTAM 3.375 G IVPB
3.3750 g | Freq: Once | INTRAVENOUS | Status: AC
  Administered 2015-01-26: 3.375 g via INTRAVENOUS
  Filled 2015-01-26: qty 50

## 2015-01-26 MED ORDER — DEXAMETHASONE SODIUM PHOSPHATE 10 MG/ML IJ SOLN: 4.0000 mg | Freq: Once | INTRAMUSCULAR | Status: DC

## 2015-01-26 MED ORDER — SODIUM CHLORIDE 0.9 % IV SOLN
INTRAVENOUS | Status: DC
  Administered 2015-01-26: 08:00:00 via INTRAVENOUS

## 2015-01-26 NOTE — H&P (Signed)
History of Present Illness: Ryan Maynard is a 64 y.o. male with unresectable metastatic colon cancer to liver who has successfully underwent pre Y-90 angiogram and embolization on 8/11. He states he did well post procedure and is scheduled today for Y-90 radioembolization. He denies any abdominal pain, nausea or vomiting. He denies any chest pain, shortness of breath or palpitations. He denies any active signs of bleeding or excessive bruising. He denies any recent fever or chills. The patient denies any history of sleep apnea or chronic oxygen use. He has previously tolerated sedation without complications.    Past Medical History  Diagnosis Date  . Hypertension   . Colon cancer metastasized to liver 06/15/2013  . History of chemotherapy jan 2016  . PONV (postoperative nausea and vomiting) age 59    Past Surgical History  Procedure Laterality Date  . Port a cath insertion  14 months ago    right chest  . Skin grafts  age 27    Allergies: Bactrim and Lyrica  Medications: Prior to Admission medications   Medication Sig Start Date End Date Taking? Authorizing Provider  amLODipine-benazepril (LOTREL) 5-20 MG per capsule Take 1 capsule by mouth daily. 09/05/14  Yes Volanda Napoleon, MD  Coenzyme Q10 10 MG capsule Take 10 mg by mouth daily.   Yes Historical Provider, MD  gabapentin (NEURONTIN) 100 MG capsule Take 1 capsule (100 mg total) by mouth 3 (three) times daily. Pt okay to take only once a day Patient taking differently: Take 100 mg by mouth daily. TAKES 1 DAILY 03/02/14  Yes Volanda Napoleon, MD  ibuprofen (ADVIL,MOTRIN) 200 MG tablet Take 200-400 mg by mouth every 6 (six) hours as needed.   Yes Historical Provider, MD  oxymetazoline (AFRIN) 0.05 % nasal spray Place 1 spray into both nostrils 2 (two) times daily as needed for congestion.   Yes Historical Provider, MD  capecitabine (XELODA) 500 MG tablet Take 3 tablets (1,500 mg total) by mouth 2 (two) times daily after a meal. Take  for 14 days, off 7 days. 07/27/14   Volanda Napoleon, MD  lidocaine-prilocaine (EMLA) cream Apply 1 application topically as needed. Apply quarter sized amount to portacath site 1-2 hours prior to chemotherapy appt.  Cover with saran wrap. Patient not taking: Reported on 01/12/2015 06/23/13   Volanda Napoleon, MD  LORazepam (ATIVAN) 0.5 MG tablet Take 1 tablet (0.5 mg total) by mouth as needed (Nausea or vomiting). 05/11/14   Volanda Napoleon, MD  venlafaxine XR (EFFEXOR-XR) 37.5 MG 24 hr capsule Take 1 capsule (37.5 mg total) by mouth daily with breakfast. Patient not taking: Reported on 01/12/2015 12/08/14   Volanda Napoleon, MD     History reviewed. No pertinent family history.  Social History   Social History  . Marital Status: Single    Spouse Name: N/A  . Number of Children: N/A  . Years of Education: N/A   Social History Main Topics  . Smoking status: Never Smoker   . Smokeless tobacco: Never Used     Comment: never used tobacco  . Alcohol Use: 0.0 oz/week    0 Standard drinks or equivalent per week     Comment: occasional wine  . Drug Use: No  . Sexual Activity: Not Asked   Other Topics Concern  . None   Social History Narrative    Review of Systems: A 12 point ROS discussed and pertinent positives are indicated in the HPI above.  All other systems are  negative.  Review of Systems  Vital Signs: T: 98 F, HR: 72 bpm, BP: 151/81 mmHg, O2: 99% RA  Physical Exam  Constitutional: He is oriented to person, place, and time. No distress.  HENT:  Head: Normocephalic.  Neck: No tracheal deviation present.  Cardiovascular: Normal rate, regular rhythm and intact distal pulses.  Exam reveals no gallop and no friction rub.   No murmur heard. RCFA pulse 2+, DP 2+ B/L  Pulmonary/Chest: Effort normal and breath sounds normal. No respiratory distress. He has no wheezes. He has no rales.  Abdominal: Soft. Bowel sounds are normal. He exhibits no distension. There is no tenderness.    Neurological: He is alert and oriented to person, place, and time.  Skin: He is not diaphoretic.  Psychiatric: He has a normal mood and affect. His behavior is normal. Thought content normal.    Mallampati Score:  MD Evaluation Airway: WNL Heart: WNL Abdomen: WNL Chest/ Lungs: WNL ASA  Classification: 3 Mallampati/Airway Score: One  Imaging:   Labs:  CBC:  Recent Labs  11/09/14 0823 12/02/14 0816 01/12/15 0758 01/26/15 0815  WBC 5.7 7.3 6.5 5.8  HGB 15.9 14.7 14.7 14.6  HCT 44.1 41.0 41.9 42.3  PLT 173 167 199 179    COAGS:  Recent Labs  08/23/14 1420 01/12/15 0758 01/26/15 0815  INR 0.99 1.03 1.01  APTT 31 30  --     BMP:  Recent Labs  08/23/14 1420 08/27/14 0405  11/09/14 0824 12/02/14 0817 01/12/15 0758 01/26/15 0815  NA 136 137  < > 140 139 136 137  K 3.7 3.6  < > 4.2 4.2 3.8 4.1  CL 103 102  < > 106 102 103 104  CO2 24 26  < > 27 24 24 26   GLUCOSE 113* 111*  < > 104 115* 105* 102*  BUN 15 12  < > 12 16 14 14   CALCIUM 9.2 8.6  < > 9.4 9.1 9.3 9.3  CREATININE 0.85 0.86  < > 1.2 0.83 0.92 0.82  GFRNONAA 89* 89*  --   --   --  >60 >60  GFRAA >90 >90  --   --   --  >60 >60  < > = values in this interval not displayed.  LIVER FUNCTION TESTS:  Recent Labs  08/27/14 0405  11/09/14 0824 12/02/14 0817 01/12/15 0800 01/26/15 0815  BILITOT 1.8*  < > 1.40 0.7 1.0 1.0  AST 313*  < > 36 26 31 31   ALT 232*  < > 31 30 19 24   ALKPHOS 81  < > 75 90 73 98  PROT 7.1  < > 7.6 6.8 7.5 8.0  ALBUMIN 3.9  --   --  4.0 4.1 4.2  < > = values in this interval not displayed.  TUMOR MARKERS:  Recent Labs  09/21/14 0806 10/12/14 0802 11/09/14 0823 12/02/14 0816  CEA 12.1* 14.6* 21.6* 33.2*    Assessment and Plan: Unresectable metastatic colon cancer to liver S/p Pre Yittrium 90 angiogram and embolization 8/11, NM without significant extrahepatic radiotracer and lung shunt fraction equals 7.3% Scheduled today for image guided Yittrium 90  radioembolization with sedation The patient has been NPO, no blood thinners taken, labs and vitals have been reviewed. Risks and Benefits discussed with the patient including, but not limited to bleeding, infection, vascular injury, post procedural pain, nausea, vomiting and fatigue, contrast induced renal failure, liver failure, radiation injury to the bowel, radiation induced cholecystitis, neutropenia and possible need for additional  procedures. All of the patient's questions were answered, patient is agreeable to proceed. Consent signed and in chart.   SignedHedy Jacob 01/26/2015, 9:03 AM

## 2015-01-26 NOTE — Discharge Instructions (Signed)
Post Y-90 Radioembolization Discharge Instructions  You have been given a radioactive material during your procedure.  While it is safe for you to be discharged home from the hospital, you need to proceed directly home.    Do not use public transportation, including air travel, lasting more than 2 hours for 1 week.  Avoid crowded public places for 1 week.  Adult visitors should try to avoid close contact with you for 1 week.    Children and pregnant females should not visit or have close contact with you for 1 week.  Items that you touch are not radioactive.  Do not sleep in the same bed as your partner for 1 week, and a condom should be used for sexual activity during the first 24 hours.  Your blood may be radioactive and caution should be used if any bleeding occurs during the recovery period.  Body fluids may be radioactive for 24 hours.  Wash your hands after voiding.  Men should sit to urinate.  Dispose of any soiled materials (flush down toilet or place in trash at home) during the first day.  Drink 6 to 8 glasses of fluids per day for 5 days to hydrate yourself.  If you need to see a doctor during the first week, you must let them know that you were treated with yttrium-90 microspheres, and will be slightly radioactive.  They can call Interventional Radiology [phone] with any questions.  Conscious Sedation, Adult, Care After Refer to this sheet in the next few weeks. These instructions provide you with information on caring for yourself after your procedure. Your health care provider may also give you more specific instructions. Your treatment has been planned according to current medical practices, but problems sometimes occur. Call your health care provider if you have any problems or questions after your procedure. WHAT TO EXPECT AFTER THE PROCEDURE  After your procedure:  You may feel sleepy, clumsy, and have poor balance for several hours.  Vomiting may occur if you eat  too soon after the procedure. HOME CARE INSTRUCTIONS  Do not participate in any activities where you could become injured for at least 24 hours. Do not:  Drive.  Swim.  Ride a bicycle.  Operate heavy machinery.  Cook.  Use power tools.  Climb ladders.  Work from a high place.  Do not make important decisions or sign legal documents until you are improved.  If you vomit, drink water, juice, or soup when you can drink without vomiting. Make sure you have little or no nausea before eating solid foods.  Only take over-the-counter or prescription medicines for pain, discomfort, or fever as directed by your health care provider.  Make sure you and your family fully understand everything about the medicines given to you, including what side effects may occur.  You should not drink alcohol, take sleeping pills, or take medicines that cause drowsiness for at least 24 hours.  If you smoke, do not smoke without supervision.  If you are feeling better, you may resume normal activities 24 hours after you were sedated.  Keep all appointments with your health care provider. SEEK MEDICAL CARE IF:  Your skin is pale or bluish in color.  You continue to feel nauseous or vomit.  Your pain is getting worse and is not helped by medicine.  You have bleeding or swelling.  You are still sleepy or feeling clumsy after 24 hours. SEEK IMMEDIATE MEDICAL CARE IF:  You develop a rash.  You have difficulty  breathing.  You develop any type of allergic problem.  You have a fever. MAKE SURE YOU:  Understand these instructions.  Will watch your condition.  Will get help right away if you are not doing well or get worse. Document Released: 03/10/2013 Document Reviewed: 03/10/2013 [facility] Patient Information 2015 [place], [place]. This information is not intended to replace advice given to you by your health care provider. Make sure you discuss any questions you have with your health care  provider.

## 2015-01-26 NOTE — Procedures (Signed)
Interventional Radiology Procedure Note  Procedure:  Hepatic arteriography with radioembolization of right hepatic artery  Complications:  None  Estimated Blood Loss:  < 25 mL  Y-90 radioembolization of right hepatic artery begun but had to be aborted due to inability to administer dose into arterial bed. Some activity was infused.  See full dictated note.  Venetia Night. Kathlene Cote, M.D Pager:  [phone]

## 2015-01-26 NOTE — Sedation Documentation (Signed)
35fr sheath removed from right fem artery by Dr. Kathlene Cote. Hemostasis achieved using exoseal closure device and manual pressure held by Lambert Mody, RTR for 2 minutes. RDP +3, groin level 0.

## 2015-01-31 ENCOUNTER — Other Ambulatory Visit (HOSPITAL_COMMUNITY): Payer: Self-pay | Admitting: Diagnostic Radiology

## 2015-01-31 DIAGNOSIS — C787 Secondary malignant neoplasm of liver and intrahepatic bile duct: Secondary | ICD-10-CM

## 2015-01-31 DIAGNOSIS — C189 Malignant neoplasm of colon, unspecified: Secondary | ICD-10-CM

## 2015-02-10 ENCOUNTER — Other Ambulatory Visit: Payer: Self-pay | Admitting: Hematology & Oncology

## 2015-02-10 DIAGNOSIS — C189 Malignant neoplasm of colon, unspecified: Secondary | ICD-10-CM

## 2015-02-10 DIAGNOSIS — C787 Secondary malignant neoplasm of liver and intrahepatic bile duct: Principal | ICD-10-CM

## 2015-02-14 ENCOUNTER — Encounter: Payer: Self-pay | Admitting: Hematology & Oncology

## 2015-02-14 ENCOUNTER — Encounter: Payer: Self-pay | Admitting: *Deleted

## 2015-02-14 ENCOUNTER — Ambulatory Visit: Payer: BLUE CROSS/BLUE SHIELD

## 2015-02-14 ENCOUNTER — Other Ambulatory Visit: Payer: Self-pay | Admitting: *Deleted

## 2015-02-14 ENCOUNTER — Ambulatory Visit (HOSPITAL_BASED_OUTPATIENT_CLINIC_OR_DEPARTMENT_OTHER): Payer: BLUE CROSS/BLUE SHIELD | Admitting: Hematology & Oncology

## 2015-02-14 ENCOUNTER — Other Ambulatory Visit (HOSPITAL_BASED_OUTPATIENT_CLINIC_OR_DEPARTMENT_OTHER): Payer: BLUE CROSS/BLUE SHIELD

## 2015-02-14 VITALS — BP 141/81 | HR 75 | Temp 97.4°F | Resp 16 | Ht 65.0 in | Wt 202.0 lb

## 2015-02-14 DIAGNOSIS — C787 Secondary malignant neoplasm of liver and intrahepatic bile duct: Principal | ICD-10-CM

## 2015-02-14 DIAGNOSIS — C189 Malignant neoplasm of colon, unspecified: Secondary | ICD-10-CM | POA: Diagnosis not present

## 2015-02-14 DIAGNOSIS — C9110 Chronic lymphocytic leukemia of B-cell type not having achieved remission: Secondary | ICD-10-CM

## 2015-02-14 DIAGNOSIS — I10 Essential (primary) hypertension: Secondary | ICD-10-CM

## 2015-02-14 LAB — CBC WITH DIFFERENTIAL (CANCER CENTER ONLY)
BASO#: 0.1 10*3/uL (ref 0.0–0.2)
BASO%: 1.5 % (ref 0.0–2.0)
EOS ABS: 0.1 10*3/uL (ref 0.0–0.5)
EOS%: 2.2 % (ref 0.0–7.0)
HEMATOCRIT: 45.3 % (ref 38.7–49.9)
HEMOGLOBIN: 15.7 g/dL (ref 13.0–17.1)
LYMPH#: 1.2 10*3/uL (ref 0.9–3.3)
LYMPH%: 21.2 % (ref 14.0–48.0)
MCH: 33.3 pg (ref 28.0–33.4)
MCHC: 34.7 g/dL (ref 32.0–35.9)
MCV: 96 fL (ref 82–98)
MONO#: 0.6 10*3/uL (ref 0.1–0.9)
MONO%: 11.1 % (ref 0.0–13.0)
NEUT%: 64 % (ref 40.0–80.0)
NEUTROS ABS: 3.5 10*3/uL (ref 1.5–6.5)
Platelets: 165 10*3/uL (ref 145–400)
RBC: 4.72 10*6/uL (ref 4.20–5.70)
RDW: 12.5 % (ref 11.1–15.7)
WBC: 5.5 10*3/uL (ref 4.0–10.0)

## 2015-02-14 LAB — COMPREHENSIVE METABOLIC PANEL (CC13)
ALBUMIN: 3.9 g/dL (ref 3.5–5.0)
ALT: 28 U/L (ref 0–55)
ANION GAP: 7 meq/L (ref 3–11)
AST: 27 U/L (ref 5–34)
Alkaline Phosphatase: 115 U/L (ref 40–150)
BILIRUBIN TOTAL: 0.73 mg/dL (ref 0.20–1.20)
BUN: 18.1 mg/dL (ref 7.0–26.0)
CALCIUM: 9.6 mg/dL (ref 8.4–10.4)
CO2: 24 mEq/L (ref 22–29)
CREATININE: 0.9 mg/dL (ref 0.7–1.3)
Chloride: 107 mEq/L (ref 98–109)
EGFR: >90 mL/min/{1.73_m2} (ref >90)
Glucose: 94 mg/dl (ref 70–140)
Potassium: 4.4 mEq/L (ref 3.5–5.1)
Sodium: 139 mEq/L (ref 136–145)
TOTAL PROTEIN: 7.4 g/dL (ref 6.4–8.3)

## 2015-02-14 MED ORDER — GABAPENTIN 100 MG PO CAPS: 100.0000 mg | ORAL_CAPSULE | Freq: Three times a day (TID) | ORAL | Status: DC

## 2015-02-14 MED ORDER — CAPECITABINE 500 MG PO TABS: 1500.0000 mg | ORAL_TABLET | Freq: Two times a day (BID) | ORAL | Status: DC

## 2015-02-14 MED ORDER — AMLODIPINE BESY-BENAZEPRIL HCL 5-20 MG PO CAPS: 1.0000 | ORAL_CAPSULE | Freq: Every day | ORAL | Status: DC
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
## 2015-02-15 LAB — CEA: CEA: 57.4 ng/mL — ABNORMAL HIGH (ref 0.0–5.0)

## 2015-02-15 LAB — LACTATE DEHYDROGENASE: LDH: 157 U/L (ref 94–250)

## 2015-02-21 ENCOUNTER — Ambulatory Visit (HOSPITAL_BASED_OUTPATIENT_CLINIC_OR_DEPARTMENT_OTHER): Payer: BLUE CROSS/BLUE SHIELD

## 2015-02-21 VITALS — BP 146/79 | HR 78 | Temp 98.2°F | Resp 18

## 2015-02-21 DIAGNOSIS — C787 Secondary malignant neoplasm of liver and intrahepatic bile duct: Secondary | ICD-10-CM | POA: Diagnosis not present

## 2015-02-21 DIAGNOSIS — Z5111 Encounter for antineoplastic chemotherapy: Secondary | ICD-10-CM | POA: Diagnosis not present

## 2015-02-21 DIAGNOSIS — C189 Malignant neoplasm of colon, unspecified: Secondary | ICD-10-CM

## 2015-02-21 DIAGNOSIS — Z5112 Encounter for antineoplastic immunotherapy: Secondary | ICD-10-CM | POA: Diagnosis not present

## 2015-02-21 LAB — UA PROTEIN, DIPSTICK - CHCC SATELLITE: Protein, Urine: <30 mg/dL

## 2015-02-21 MED ORDER — SODIUM CHLORIDE 0.9 % IV SOLN
Freq: Once | INTRAVENOUS | Status: AC
  Administered 2015-02-21: 12:00:00 via INTRAVENOUS

## 2015-02-21 MED ORDER — OXALIPLATIN CHEMO INJECTION 100 MG/20ML
117.0000 mg/m2 | Freq: Once | INTRAVENOUS | Status: AC
  Administered 2015-02-21: 240 mg via INTRAVENOUS
  Filled 2015-02-21: qty 38

## 2015-02-21 MED ORDER — SODIUM CHLORIDE 0.9 % IJ SOLN
10.0000 mL | INTRAMUSCULAR | Status: DC | PRN
  Administered 2015-02-21: 10 mL
  Filled 2015-02-21: qty 10

## 2015-02-21 MED ORDER — BEVACIZUMAB CHEMO INJECTION 400 MG/16ML
7.7000 mg/kg | Freq: Once | INTRAVENOUS | Status: AC
  Administered 2015-02-21: 700 mg via INTRAVENOUS
  Filled 2015-02-21: qty 8

## 2015-02-21 MED ORDER — HEPARIN SOD (PORK) LOCK FLUSH 100 UNIT/ML IV SOLN
500.0000 [IU] | Freq: Once | INTRAVENOUS | Status: AC | PRN
  Administered 2015-02-21: 500 [IU]
  Filled 2015-02-21: qty 5

## 2015-02-21 MED ORDER — SODIUM CHLORIDE 0.9 % IV SOLN
Freq: Once | INTRAVENOUS | Status: AC
  Administered 2015-02-21: 09:00:00 via INTRAVENOUS
  Filled 2015-02-21: qty 4

## 2015-02-21 MED ORDER — DEXTROSE 5 % IV SOLN
Freq: Once | INTRAVENOUS | Status: AC
  Administered 2015-02-21: 09:00:00 via INTRAVENOUS

## 2015-02-21 NOTE — Patient Instructions (Signed)
[facility] Discharge Instructions for Patients Receiving Chemotherapy  Today you received the following chemotherapy agents Oxaliplatin and Avastin  To help prevent nausea and vomiting after your treatment, we encourage you to take your nausea medication    If you develop nausea and vomiting that is not controlled by your nausea medication, call the clinic.   BELOW ARE SYMPTOMS THAT SHOULD BE REPORTED IMMEDIATELY:  *FEVER GREATER THAN 100.5 F  *CHILLS WITH OR WITHOUT FEVER  NAUSEA AND VOMITING THAT IS NOT CONTROLLED WITH YOUR NAUSEA MEDICATION  *UNUSUAL SHORTNESS OF BREATH  *UNUSUAL BRUISING OR BLEEDING  TENDERNESS IN MOUTH AND THROAT WITH OR WITHOUT PRESENCE OF ULCERS  *URINARY PROBLEMS  *BOWEL PROBLEMS  UNUSUAL RASH Items with * indicate a potential emergency and should be followed up as soon as possible.  Feel free to call the clinic you have any questions or concerns. The clinic phone number is [phone].  Please show the CHEMO ALERT CARD at check-in to the Emergency Department and triage nurse.   

## 2015-02-27 ENCOUNTER — Other Ambulatory Visit (HOSPITAL_COMMUNITY): Payer: BLUE CROSS/BLUE SHIELD

## 2015-03-21 ENCOUNTER — Ambulatory Visit (HOSPITAL_BASED_OUTPATIENT_CLINIC_OR_DEPARTMENT_OTHER): Payer: BLUE CROSS/BLUE SHIELD | Admitting: Hematology & Oncology

## 2015-03-21 ENCOUNTER — Other Ambulatory Visit (HOSPITAL_BASED_OUTPATIENT_CLINIC_OR_DEPARTMENT_OTHER): Payer: BLUE CROSS/BLUE SHIELD

## 2015-03-21 ENCOUNTER — Ambulatory Visit (HOSPITAL_BASED_OUTPATIENT_CLINIC_OR_DEPARTMENT_OTHER): Payer: BLUE CROSS/BLUE SHIELD

## 2015-03-21 ENCOUNTER — Encounter: Payer: Self-pay | Admitting: Hematology & Oncology

## 2015-03-21 VITALS — BP 161/88 | HR 72 | Temp 97.3°F | Resp 16 | Ht 65.0 in | Wt 205.0 lb

## 2015-03-21 VITALS — BP 140/77 | HR 61 | Temp 98.2°F | Resp 18

## 2015-03-21 DIAGNOSIS — C189 Malignant neoplasm of colon, unspecified: Secondary | ICD-10-CM

## 2015-03-21 DIAGNOSIS — C787 Secondary malignant neoplasm of liver and intrahepatic bile duct: Secondary | ICD-10-CM

## 2015-03-21 DIAGNOSIS — Z5111 Encounter for antineoplastic chemotherapy: Secondary | ICD-10-CM

## 2015-03-21 LAB — CMP (CANCER CENTER ONLY)
ALK PHOS: 95 U/L — AB (ref 26–84)
ALT(SGPT): 25 U/L (ref 10–47)
AST: 32 U/L (ref 11–38)
Albumin: 3.7 g/dL (ref 3.3–5.5)
BILIRUBIN TOTAL: 0.9 mg/dL (ref 0.20–1.60)
BUN: 16 mg/dL (ref 7–22)
CO2: 25 mEq/L (ref 18–33)
CREATININE: 0.7 mg/dL (ref 0.6–1.2)
Calcium: 9.3 mg/dL (ref 8.0–10.3)
Chloride: 105 mEq/L (ref 98–108)
GLUCOSE: 97 mg/dL (ref 73–118)
POTASSIUM: 4.3 meq/L (ref 3.3–4.7)
Sodium: 137 mEq/L (ref 128–145)
TOTAL PROTEIN: 7.4 g/dL (ref 6.4–8.1)

## 2015-03-21 LAB — CBC WITH DIFFERENTIAL (CANCER CENTER ONLY)
BASO#: 0.1 10*3/uL (ref 0.0–0.2)
BASO%: 2.1 % — ABNORMAL HIGH (ref 0.0–2.0)
EOS ABS: 0.1 10*3/uL (ref 0.0–0.5)
EOS%: 2.7 % (ref 0.0–7.0)
HCT: 45.1 % (ref 38.7–49.9)
HGB: 15.9 g/dL (ref 13.0–17.1)
LYMPH#: 1.3 10*3/uL (ref 0.9–3.3)
LYMPH%: 27.5 % (ref 14.0–48.0)
MCH: 33.2 pg (ref 28.0–33.4)
MCHC: 35.3 g/dL (ref 32.0–35.9)
MCV: 94 fL (ref 82–98)
MONO#: 0.7 10*3/uL (ref 0.1–0.9)
MONO%: 14.5 % — ABNORMAL HIGH (ref 0.0–13.0)
NEUT%: 53.2 % (ref 40.0–80.0)
NEUTROS ABS: 2.6 10*3/uL (ref 1.5–6.5)
PLATELETS: 110 10*3/uL — AB (ref 145–400)
RBC: 4.79 10*6/uL (ref 4.20–5.70)
RDW: 13.9 % (ref 11.1–15.7)
WBC: 4.8 10*3/uL (ref 4.0–10.0)

## 2015-03-21 MED ORDER — SODIUM CHLORIDE 0.9 % IV SOLN
Freq: Once | INTRAVENOUS | Status: AC
  Administered 2015-03-21: 10:00:00 via INTRAVENOUS

## 2015-03-21 MED ORDER — DEXTROSE 5 % IV SOLN
Freq: Once | INTRAVENOUS | Status: AC
  Administered 2015-03-21: 11:00:00 via INTRAVENOUS

## 2015-03-21 MED ORDER — OXALIPLATIN CHEMO INJECTION 100 MG/20ML: 117.0000 mg/m2 | Freq: Once | INTRAVENOUS | Status: DC

## 2015-03-21 MED ORDER — BEVACIZUMAB CHEMO INJECTION 400 MG/16ML
7.6000 mg/kg | Freq: Once | INTRAVENOUS | Status: AC
  Administered 2015-03-21: 700 mg via INTRAVENOUS
  Filled 2015-03-21: qty 24

## 2015-03-21 MED ORDER — SODIUM CHLORIDE 0.9 % IJ SOLN
10.0000 mL | INTRAMUSCULAR | Status: DC | PRN
  Administered 2015-03-21: 10 mL
  Filled 2015-03-21: qty 10

## 2015-03-21 MED ORDER — SODIUM CHLORIDE 0.9 % IV SOLN: Freq: Once | INTRAVENOUS | Status: DC

## 2015-03-21 MED ORDER — OXALIPLATIN CHEMO INJECTION 100 MG/20ML
94.0000 mg/m2 | Freq: Once | INTRAVENOUS | Status: AC
  Administered 2015-03-21: 195 mg via INTRAVENOUS
  Filled 2015-03-21: qty 39

## 2015-03-21 MED ORDER — HEPARIN SOD (PORK) LOCK FLUSH 100 UNIT/ML IV SOLN
500.0000 [IU] | Freq: Once | INTRAVENOUS | Status: AC | PRN
  Administered 2015-03-21: 500 [IU]
  Filled 2015-03-21: qty 5

## 2015-03-21 NOTE — Patient Instructions (Signed)
[facility] Discharge Instructions for Patients Receiving Chemotherapy  Today you received the following chemotherapy agents AVASTIN AND OXALIPLATIN.   To help prevent nausea and vomiting after your treatment, we encourage you to take your nausea medication AS PRESCRIBED.   {CHL ONC AP TAKE HOME MEDS:115 If you develop nausea and vomiting that is not controlled by your nausea medication, call the clinic.   BELOW ARE SYMPTOMS THAT SHOULD BE REPORTED IMMEDIATELY:  *FEVER GREATER THAN 100.5 F  *CHILLS WITH OR WITHOUT FEVER  NAUSEA AND VOMITING THAT IS NOT CONTROLLED WITH YOUR NAUSEA MEDICATION  *UNUSUAL SHORTNESS OF BREATH  *UNUSUAL BRUISING OR BLEEDING  TENDERNESS IN MOUTH AND THROAT WITH OR WITHOUT PRESENCE OF ULCERS  *URINARY PROBLEMS  *BOWEL PROBLEMS  UNUSUAL RASH Items with * indicate a potential emergency and should be followed up as soon as possible.  Feel free to call the clinic you have any questions or concerns. The clinic phone number is [phone].  Please show the [facility] at check-in to the Emergency Department and triage nurse.

## 2015-03-22 ENCOUNTER — Telehealth: Payer: Self-pay | Admitting: *Deleted

## 2015-03-22 LAB — CEA: CEA: 33 ng/mL — AB (ref 0.0–5.0)

## 2015-03-22 NOTE — Telephone Encounter (Addendum)
 -----   Message from Volanda Napoleon, MD sent at 03/22/2015  7:39 AM EDT ----- Call - CEA is down to 33!!  This is a nice drop!!  Laurey Arrow
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
## 2015-03-23 ENCOUNTER — Encounter: Payer: Self-pay | Admitting: Nurse Practitioner

## 2015-04-04 ENCOUNTER — Ambulatory Visit: Payer: BLUE CROSS/BLUE SHIELD

## 2015-04-04 ENCOUNTER — Other Ambulatory Visit: Payer: BLUE CROSS/BLUE SHIELD

## 2015-04-04 ENCOUNTER — Ambulatory Visit: Payer: BLUE CROSS/BLUE SHIELD | Admitting: Family

## 2015-04-06 ENCOUNTER — Ambulatory Visit: Payer: BLUE CROSS/BLUE SHIELD | Admitting: Hematology & Oncology

## 2015-04-06 ENCOUNTER — Other Ambulatory Visit: Payer: BLUE CROSS/BLUE SHIELD

## 2015-04-06 ENCOUNTER — Ambulatory Visit: Payer: BLUE CROSS/BLUE SHIELD

## 2015-04-19 ENCOUNTER — Ambulatory Visit (HOSPITAL_BASED_OUTPATIENT_CLINIC_OR_DEPARTMENT_OTHER): Payer: BLUE CROSS/BLUE SHIELD

## 2015-04-19 ENCOUNTER — Encounter: Payer: Self-pay | Admitting: Family

## 2015-04-19 ENCOUNTER — Ambulatory Visit (HOSPITAL_BASED_OUTPATIENT_CLINIC_OR_DEPARTMENT_OTHER): Payer: BLUE CROSS/BLUE SHIELD | Admitting: Family

## 2015-04-19 ENCOUNTER — Other Ambulatory Visit (HOSPITAL_BASED_OUTPATIENT_CLINIC_OR_DEPARTMENT_OTHER): Payer: BLUE CROSS/BLUE SHIELD

## 2015-04-19 VITALS — BP 164/106 | HR 76 | Temp 98.7°F | Resp 16 | Ht 65.0 in | Wt 204.0 lb

## 2015-04-19 VITALS — BP 156/91 | HR 72 | Temp 98.2°F | Resp 18

## 2015-04-19 DIAGNOSIS — C787 Secondary malignant neoplasm of liver and intrahepatic bile duct: Secondary | ICD-10-CM

## 2015-04-19 DIAGNOSIS — C189 Malignant neoplasm of colon, unspecified: Secondary | ICD-10-CM | POA: Diagnosis not present

## 2015-04-19 DIAGNOSIS — Z5112 Encounter for antineoplastic immunotherapy: Secondary | ICD-10-CM

## 2015-04-19 DIAGNOSIS — Z5111 Encounter for antineoplastic chemotherapy: Secondary | ICD-10-CM | POA: Diagnosis not present

## 2015-04-19 LAB — UA PROTEIN, DIPSTICK - CHCC SATELLITE: PROTEIN, URINE: NEGATIVE mg/dL

## 2015-04-19 LAB — CMP (CANCER CENTER ONLY)
ALT(SGPT): 26 U/L (ref 10–47)
AST: 29 U/L (ref 11–38)
Albumin: 3.6 g/dL (ref 3.3–5.5)
Alkaline Phosphatase: 96 U/L — ABNORMAL HIGH (ref 26–84)
BILIRUBIN TOTAL: 1.1 mg/dL (ref 0.20–1.60)
BUN: 16 mg/dL (ref 7–22)
CHLORIDE: 105 meq/L (ref 98–108)
CO2: 26 meq/L (ref 18–33)
CREATININE: 0.9 mg/dL (ref 0.6–1.2)
Calcium: 9.5 mg/dL (ref 8.0–10.3)
GLUCOSE: 101 mg/dL (ref 73–118)
Potassium: 4 mEq/L (ref 3.3–4.7)
SODIUM: 143 meq/L (ref 128–145)
Total Protein: 8.3 g/dL — ABNORMAL HIGH (ref 6.4–8.1)

## 2015-04-19 LAB — CBC WITH DIFFERENTIAL (CANCER CENTER ONLY)
BASO#: 0.1 10*3/uL (ref 0.0–0.2)
BASO%: 1.6 % (ref 0.0–2.0)
EOS ABS: 0.1 10*3/uL (ref 0.0–0.5)
EOS%: 1.4 % (ref 0.0–7.0)
HCT: 45.4 % (ref 38.7–49.9)
HEMOGLOBIN: 16 g/dL (ref 13.0–17.1)
LYMPH#: 1.4 10*3/uL (ref 0.9–3.3)
LYMPH%: 27.7 % (ref 14.0–48.0)
MCH: 32.7 pg (ref 28.0–33.4)
MCHC: 35.2 g/dL (ref 32.0–35.9)
MCV: 93 fL (ref 82–98)
MONO#: 0.8 10*3/uL (ref 0.1–0.9)
MONO%: 15.4 % — AB (ref 0.0–13.0)
NEUT%: 53.9 % (ref 40.0–80.0)
NEUTROS ABS: 2.7 10*3/uL (ref 1.5–6.5)
PLATELETS: 112 10*3/uL — AB (ref 145–400)
RBC: 4.9 10*6/uL (ref 4.20–5.70)
RDW: 15.2 % (ref 11.1–15.7)
WBC: 5 10*3/uL (ref 4.0–10.0)

## 2015-04-19 MED ORDER — SODIUM CHLORIDE 0.9 % IV SOLN
Freq: Once | INTRAVENOUS | Status: AC
  Administered 2015-04-19: 13:00:00 via INTRAVENOUS
  Filled 2015-04-19: qty 4

## 2015-04-19 MED ORDER — SODIUM CHLORIDE 0.9 % IV SOLN
7.7000 mg/kg | Freq: Once | INTRAVENOUS | Status: AC
  Administered 2015-04-19: 700 mg via INTRAVENOUS
  Filled 2015-04-19: qty 28

## 2015-04-19 MED ORDER — SODIUM CHLORIDE 0.9 % IV SOLN
Freq: Once | INTRAVENOUS | Status: AC
  Administered 2015-04-19: 13:00:00 via INTRAVENOUS

## 2015-04-19 MED ORDER — SODIUM CHLORIDE 0.9 % IV SOLN: Freq: Once | INTRAVENOUS | Status: DC

## 2015-04-19 MED ORDER — GABAPENTIN 100 MG PO CAPS: 200.0000 mg | ORAL_CAPSULE | Freq: Three times a day (TID) | ORAL | Status: DC

## 2015-04-19 MED ORDER — FOSAPREPITANT DIMEGLUMINE INJECTION 150 MG
Freq: Once | INTRAVENOUS | Status: DC
  Filled 2015-04-19: qty 5

## 2015-04-19 MED ORDER — DEXTROSE 5 % IV SOLN
Freq: Once | INTRAVENOUS | Status: AC
Start: 2015-04-19 — End: 2015-04-19
  Administered 2015-04-19: 14:00:00 via INTRAVENOUS

## 2015-04-19 MED ORDER — HEPARIN SOD (PORK) LOCK FLUSH 100 UNIT/ML IV SOLN
500.0000 [IU] | Freq: Once | INTRAVENOUS | Status: AC | PRN
  Administered 2015-04-19: 500 [IU]
  Filled 2015-04-19: qty 5

## 2015-04-19 MED ORDER — OXALIPLATIN CHEMO INJECTION 100 MG/20ML
94.0000 mg/m2 | Freq: Once | INTRAVENOUS | Status: AC
  Administered 2015-04-19: 195 mg via INTRAVENOUS
  Filled 2015-04-19: qty 39

## 2015-04-19 MED ORDER — SODIUM CHLORIDE 0.9 % IJ SOLN
10.0000 mL | INTRAMUSCULAR | Status: DC | PRN
  Administered 2015-04-19: 10 mL
  Filled 2015-04-19: qty 10

## 2015-04-19 MED ORDER — PALONOSETRON HCL INJECTION 0.25 MG/5ML: 0.2500 mg | Freq: Once | INTRAVENOUS | Status: DC

## 2015-04-19 NOTE — Patient Instructions (Signed)
[facility] Discharge Instructions for Patients Receiving Chemotherapy  Today you received the following chemotherapy agents Avastin and Oxaliplatin.   To help prevent nausea and vomiting after your treatment, we encourage you to take your nausea medication.   If you develop nausea and vomiting that is not controlled by your nausea medication, call the clinic.   BELOW ARE SYMPTOMS THAT SHOULD BE REPORTED IMMEDIATELY:  *FEVER GREATER THAN 100.5 F  *CHILLS WITH OR WITHOUT FEVER  NAUSEA AND VOMITING THAT IS NOT CONTROLLED WITH YOUR NAUSEA MEDICATION  *UNUSUAL SHORTNESS OF BREATH  *UNUSUAL BRUISING OR BLEEDING  TENDERNESS IN MOUTH AND THROAT WITH OR WITHOUT PRESENCE OF ULCERS  *URINARY PROBLEMS  *BOWEL PROBLEMS  UNUSUAL RASH Items with * indicate a potential emergency and should be followed up as soon as possible.  Feel free to call the clinic you have any questions or concerns. The clinic phone number is [phone].  Please show the [facility] at check-in to the Emergency Department and triage nurse.

## 2015-04-19 NOTE — Progress Notes (Signed)
Hematology and Oncology Follow Up Visit  Ryan Maynard [id number] 02/14/1951 64 y.o. 04/19/2015   Principle Diagnosis:  Metastatic colon cancer - K-RAS wild-type - progressive  Current Therapy:   XELOX/Avastin s/p cycle 4  Status post RFA of liver metastases    Interim History:  [name] is here today for a follow-up and cycle 5 of treatment. He had severe nausea after his last cycle. He attributes this to not getting the dex/zofran. He was having hiccups with this and decided he would prefer the hiccups over nausea.  He is having pain in both knees more in the left. He is unsure as to what is causing this.  He has some numbness and tingling in his hands and feet. He feels that this may be increasing in his feet. He has had no falls. I notified Dr. Marin Maynard of this so an adjustment could be made to his dosage. He is still having "lock jaw" when her first takes a bite of food. He has had no issues swallowing. He has a good appetite and is still eating. He is staying well hydrated. His weight is unchanged.  He has had no swelling in his extremities. No lymphadenopathy found on exam.  No fever, chills, cough, rash, dizziness, SOB, chest pain, palpitations, abdominal pain or changes in bowel or bladder habits.  No episodes of bleeding, bruising or petechiae.   Medications:    Medication List       This list is accurate as of: 04/19/15  6:12 PM.  Always use your most recent med list.               amLODipine-benazepril 5-20 MG capsule  Commonly known as:  LOTREL  Take 1 capsule by mouth daily.     capecitabine 500 MG tablet  Commonly known as:  XELODA  Take 3 tablets (1,500 mg total) by mouth 2 (two) times daily after a meal. Take for 14 days, off 7 days.     Coenzyme Q10 10 MG capsule  Take 10 mg by mouth daily.     gabapentin 100 MG capsule  Commonly known as:  NEURONTIN  Take 1 capsule (100 mg total) by mouth 3 (three) times daily. Pt okay to take only once a day     ibuprofen 200 MG tablet  Commonly known as:  ADVIL,MOTRIN  Take 200-400 mg by mouth every 6 (six) hours as needed.     lidocaine-prilocaine cream  Commonly known as:  EMLA  Apply 1 application topically as needed. Apply quarter sized amount to portacath site 1-2 hours prior to chemotherapy appt.  Cover with saran wrap.     LORazepam 0.5 MG tablet  Commonly known as:  ATIVAN  Take 1 tablet (0.5 mg total) by mouth as needed (Nausea or vomiting).     oxymetazoline 0.05 % nasal spray  Commonly known as:  AFRIN  Place 1 spray into both nostrils 2 (two) times daily as needed for congestion.        Allergies:  Allergies  Allergen Reactions  . Bactrim [Sulfamethoxazole-Trimethoprim] Diarrhea    Has taken 2 different times and both times frequent diarrhea  . Lyrica [Pregabalin]     "thought I was going to die"  . Zofran [Ondansetron Hcl] Other (See Comments)    Hiccups    Past Medical History, Surgical history, Social history, and Family History were reviewed and updated.  Review of Systems: All other 10 point review of systems is negative.   Physical Exam:  height is _0  (1.651 m) and weight is 204 lb (92.534 kg). His oral temperature is 98.7 F (37.1 C). His blood pressure is 164/106 and his pulse is 76. His respiration is 16.   Wt Readings from Last 3 Encounters:  04/19/15 204 lb (92.534 kg)  03/21/15 205 lb (92.987 kg)  02/14/15 202 lb (91.627 kg)    Ocular: Sclerae unicteric, pupils equal, round and reactive to light Ear-nose-throat: Oropharynx clear, dentition fair Lymphatic: No cervical supraclavicular or axillary adenopathy Lungs no rales or rhonchi, good excursion bilaterally Heart regular rate and rhythm, no murmur appreciated Abd soft, nontender, positive bowel sounds MSK no focal spinal tenderness, no joint edema Neuro: non-focal, well-oriented, appropriate affect Breasts: Deferred  Lab Results  Component Value Date   WBC 5.0 04/19/2015   HGB 16.0  04/19/2015   HCT 45.4 04/19/2015   MCV 93 04/19/2015   PLT 112* 04/19/2015   No results found for: FERRITIN, IRON, TIBC, UIBC, IRONPCTSAT Lab Results  Component Value Date   RBC 4.90 04/19/2015   No results found for: KPAFRELGTCHN, LAMBDASER, KAPLAMBRATIO No results found for: IGGSERUM, IGA, IGMSERUM No results found for: Kathrynn Ducking, MSPIKE, SPEI   Chemistry      Component Value Date/Time   NA 143 04/19/2015 1100   NA 139 02/14/2015 0753   NA 137 01/26/2015 0815   K 4.0 04/19/2015 1100   K 4.4 02/14/2015 0753   K 4.1 01/26/2015 0815   CL 105 04/19/2015 1100   CL 104 01/26/2015 0815   CO2 26 04/19/2015 1100   CO2 24 02/14/2015 0753   CO2 26 01/26/2015 0815   BUN 16 04/19/2015 1100   BUN 18.1 02/14/2015 0753   BUN 14 01/26/2015 0815   CREATININE 0.9 04/19/2015 1100   CREATININE 0.9 02/14/2015 0753   CREATININE 0.82 01/26/2015 0815      Component Value Date/Time   CALCIUM 9.5 04/19/2015 1100   CALCIUM 9.6 02/14/2015 0753   CALCIUM 9.3 01/26/2015 0815   ALKPHOS 96* 04/19/2015 1100   ALKPHOS 115 02/14/2015 0753   ALKPHOS 98 01/26/2015 0815   AST 29 04/19/2015 1100   AST 27 02/14/2015 0753   AST 31 01/26/2015 0815   ALT 26 04/19/2015 1100   ALT 28 02/14/2015 0753   ALT 24 01/26/2015 0815   BILITOT 1.10 04/19/2015 1100   BILITOT 0.73 02/14/2015 0753   BILITOT 1.0 01/26/2015 0815     Impression and Plan: [name] is 64 year old gentleman with metastatic colon cancer. He had a rough time with his last treatment with nausea after he skipped the dex/zofran. He plans to take this today with treatment and just live with the hiccups. So far he has done well with treatment.  His CEA last month was down to 33. Level today pending.  His urine protein today is negative.  We will proceed with cycle 5 today as planned.  We will repeat a PET scan on him in 2 weeks.  We also increased his Neurontin to 200 mg TID for his neuropathy.    He has his follow-up appointment scheduled.  He knows to contact us with any questions or concerns. We can certainly see him sooner if need be.   Eliezer Bottom, NP 11/16/20166:12 PM

## 2015-04-20 LAB — CEA: CEA: 20.4 ng/mL — ABNORMAL HIGH (ref 0.0–5.0)

## 2015-04-21 ENCOUNTER — Telehealth: Payer: Self-pay | Admitting: Hematology & Oncology

## 2015-04-21 NOTE — Telephone Encounter (Signed)
Called patient's home # and l/m for pt to rtn my call regarding PET SCAN.

## 2015-04-26 ENCOUNTER — Ambulatory Visit: Payer: BLUE CROSS/BLUE SHIELD

## 2015-04-26 ENCOUNTER — Other Ambulatory Visit: Payer: BLUE CROSS/BLUE SHIELD

## 2015-04-26 ENCOUNTER — Ambulatory Visit: Payer: BLUE CROSS/BLUE SHIELD | Admitting: Hematology & Oncology

## 2015-05-02 ENCOUNTER — Ambulatory Visit (HOSPITAL_COMMUNITY): Payer: BLUE CROSS/BLUE SHIELD

## 2015-05-02 ENCOUNTER — Ambulatory Visit (HOSPITAL_COMMUNITY)
Admission: RE | Admit: 2015-05-02 | Discharge: 2015-05-02 | Disposition: A | Discharge status: Home / Self Care | Payer: BLUE CROSS/BLUE SHIELD | Source: Ambulatory Visit | Attending: Family | Admitting: Family

## 2015-05-02 DIAGNOSIS — C189 Malignant neoplasm of colon, unspecified: Secondary | ICD-10-CM | POA: Diagnosis present

## 2015-05-02 DIAGNOSIS — Z0189 Encounter for other specified special examinations: Secondary | ICD-10-CM | POA: Insufficient documentation

## 2015-05-02 DIAGNOSIS — C787 Secondary malignant neoplasm of liver and intrahepatic bile duct: Secondary | ICD-10-CM | POA: Diagnosis present

## 2015-05-02 LAB — GLUCOSE, CAPILLARY: Glucose-Capillary: 99 mg/dL (ref 65–99)

## 2015-05-02 MED ORDER — FLUDEOXYGLUCOSE F - 18 (FDG) INJECTION
10.9000 | Freq: Once | INTRAVENOUS | Status: AC | PRN
  Administered 2015-05-02: 10.9 via INTRAVENOUS

## 2015-05-16 ENCOUNTER — Ambulatory Visit (HOSPITAL_BASED_OUTPATIENT_CLINIC_OR_DEPARTMENT_OTHER): Payer: BLUE CROSS/BLUE SHIELD | Admitting: Family

## 2015-05-16 ENCOUNTER — Ambulatory Visit (HOSPITAL_BASED_OUTPATIENT_CLINIC_OR_DEPARTMENT_OTHER): Payer: BLUE CROSS/BLUE SHIELD

## 2015-05-16 ENCOUNTER — Other Ambulatory Visit (HOSPITAL_BASED_OUTPATIENT_CLINIC_OR_DEPARTMENT_OTHER): Payer: BLUE CROSS/BLUE SHIELD

## 2015-05-16 ENCOUNTER — Encounter: Payer: Self-pay | Admitting: Family

## 2015-05-16 VITALS — BP 157/87 | HR 80 | Temp 98.0°F | Resp 16 | Ht 65.0 in | Wt 203.0 lb

## 2015-05-16 DIAGNOSIS — C787 Secondary malignant neoplasm of liver and intrahepatic bile duct: Secondary | ICD-10-CM

## 2015-05-16 DIAGNOSIS — Z5112 Encounter for antineoplastic immunotherapy: Secondary | ICD-10-CM

## 2015-05-16 DIAGNOSIS — C189 Malignant neoplasm of colon, unspecified: Secondary | ICD-10-CM

## 2015-05-16 LAB — CMP (CANCER CENTER ONLY)
ALBUMIN: 3.5 g/dL (ref 3.3–5.5)
ALT(SGPT): 26 U/L (ref 10–47)
AST: 35 U/L (ref 11–38)
Alkaline Phosphatase: 89 U/L — ABNORMAL HIGH (ref 26–84)
BUN, Bld: 13 mg/dL (ref 7–22)
CALCIUM: 9.5 mg/dL (ref 8.0–10.3)
CHLORIDE: 101 meq/L (ref 98–108)
CO2: 25 mEq/L (ref 18–33)
Creat: 0.7 mg/dl (ref 0.6–1.2)
Glucose, Bld: 92 mg/dL (ref 73–118)
POTASSIUM: 4 meq/L (ref 3.3–4.7)
Sodium: 136 mEq/L (ref 128–145)
TOTAL PROTEIN: 8.1 g/dL (ref 6.4–8.1)
Total Bilirubin: 1.1 mg/dl (ref 0.20–1.60)

## 2015-05-16 LAB — CBC WITH DIFFERENTIAL (CANCER CENTER ONLY)
BASO#: 0.1 10*3/uL (ref 0.0–0.2)
BASO%: 1 % (ref 0.0–2.0)
EOS ABS: 0.2 10*3/uL (ref 0.0–0.5)
EOS%: 1.9 % (ref 0.0–7.0)
HCT: 45.3 % (ref 38.7–49.9)
HGB: 15.9 g/dL (ref 13.0–17.1)
LYMPH#: 1.4 10*3/uL (ref 0.9–3.3)
LYMPH%: 17.9 % (ref 14.0–48.0)
MCH: 32.9 pg (ref 28.0–33.4)
MCHC: 35.1 g/dL (ref 32.0–35.9)
MCV: 94 fL (ref 82–98)
MONO#: 1.3 10*3/uL — AB (ref 0.1–0.9)
MONO%: 16.3 % — ABNORMAL HIGH (ref 0.0–13.0)
NEUT#: 5 10*3/uL (ref 1.5–6.5)
NEUT%: 62.9 % (ref 40.0–80.0)
PLATELETS: 111 10*3/uL — AB (ref 145–400)
RBC: 4.83 10*6/uL (ref 4.20–5.70)
RDW: 15.9 % — AB (ref 11.1–15.7)
WBC: 8 10*3/uL (ref 4.0–10.0)

## 2015-05-16 MED ORDER — SODIUM CHLORIDE 0.9 % IV SOLN
7.7000 mg/kg | Freq: Once | INTRAVENOUS | Status: AC
  Administered 2015-05-16: 700 mg via INTRAVENOUS
  Filled 2015-05-16: qty 16

## 2015-05-16 MED ORDER — HEPARIN SOD (PORK) LOCK FLUSH 100 UNIT/ML IV SOLN
500.0000 [IU] | Freq: Once | INTRAVENOUS | Status: AC | PRN
  Administered 2015-05-16: 500 [IU]
  Filled 2015-05-16: qty 5

## 2015-05-16 MED ORDER — DEXTROSE 5 % IV SOLN
Freq: Once | INTRAVENOUS | Status: AC
  Administered 2015-05-16: 13:00:00 via INTRAVENOUS

## 2015-05-16 MED ORDER — SODIUM CHLORIDE 0.9 % IV SOLN
Freq: Once | INTRAVENOUS | Status: AC
  Administered 2015-05-16: 12:00:00 via INTRAVENOUS
  Filled 2015-05-16: qty 4

## 2015-05-16 MED ORDER — OXALIPLATIN CHEMO INJECTION 100 MG/20ML
94.0000 mg/m2 | Freq: Once | INTRAVENOUS | Status: AC
  Administered 2015-05-16: 195 mg via INTRAVENOUS
  Filled 2015-05-16: qty 39

## 2015-05-16 MED ORDER — SODIUM CHLORIDE 0.9 % IJ SOLN
10.0000 mL | INTRAMUSCULAR | Status: DC | PRN
  Administered 2015-05-16: 10 mL
  Filled 2015-05-16: qty 10

## 2015-05-16 MED ORDER — SODIUM CHLORIDE 0.9 % IV SOLN
Freq: Once | INTRAVENOUS | Status: AC
  Administered 2015-05-16: 12:00:00 via INTRAVENOUS

## 2015-05-16 NOTE — Patient Instructions (Signed)
[facility] Discharge Instructions for Patients Receiving Chemotherapy  Today you received the following chemotherapy agents Avastin and Oxaliplatin.   To help prevent nausea and vomiting after your treatment, we encourage you to take your nausea medication.   If you develop nausea and vomiting that is not controlled by your nausea medication, call the clinic.   BELOW ARE SYMPTOMS THAT SHOULD BE REPORTED IMMEDIATELY:  *FEVER GREATER THAN 100.5 F  *CHILLS WITH OR WITHOUT FEVER  NAUSEA AND VOMITING THAT IS NOT CONTROLLED WITH YOUR NAUSEA MEDICATION  *UNUSUAL SHORTNESS OF BREATH  *UNUSUAL BRUISING OR BLEEDING  TENDERNESS IN MOUTH AND THROAT WITH OR WITHOUT PRESENCE OF ULCERS  *URINARY PROBLEMS  *BOWEL PROBLEMS  UNUSUAL RASH Items with * indicate a potential emergency and should be followed up as soon as possible.  Feel free to call the clinic you have any questions or concerns. The clinic phone number is [phone].  Please show the [facility] at check-in to the Emergency Department and triage nurse.

## 2015-05-16 NOTE — Progress Notes (Signed)
Hematology and Oncology Follow Up Visit  Taishaun Levels [id number] April 13, 1951 64 y.o. 05/16/2015   Principle Diagnosis:  Metastatic colon cancer - K-RAS wild-type - progressive  Current Therapy:   XELOX/Avastin s/p cycle 4  Status post RFA of liver metastases    Interim History:  Mr. Diel is here today for a follow-up and cycle 5 of treatment. He is still having pain in his knees that he has associated with the XELOX. He states that it is tolerable and he has had no falls.  He does have a bit of a sinus infection and is taking over the counter medication to help with the drainage. He does not have a cough. His sinus drainage has been clear.  He has the sensitivity to cold for about 6 days after receiving Oxaliplatin. The numbness and tingling in his hands and feet is improved. He states that it is really just the sensitivity to cold right after chemo that bothers him.  His PET scan last week showed stable to mildly decreased metabolism within the right live lobe metastasis and and interval progression of hypermetabolic left liver lobe metastasis. No new areas of metastatic disease. He also had an area of hypermetabolism within the left peripheral zone of the prostate apex. We will get a PSA today.  No fever, chills, n/v, cough, rash, dizziness, SOB, chest pain, palpitations, abdominal pain or changes in bowel or bladder habits.  He has had no swelling in his extremities. No lymphadenopathy found on exam.  No episodes of bleeding or bruising.  He has a good appetite and is staying well hydrated. His weight is unchanged.   Medications:    Medication List       This list is accurate as of: 05/16/15  4:25 PM.  Always use your most recent med list.               amLODipine-benazepril 5-20 MG capsule  Commonly known as:  LOTREL  Take 1 capsule by mouth daily.     capecitabine 500 MG tablet  Commonly known as:  XELODA  Take 3 tablets (1,500 mg total) by mouth 2 (two) times daily after  a meal. Take for 14 days, off 7 days.     Coenzyme Q10 10 MG capsule  Take 10 mg by mouth daily.     gabapentin 100 MG capsule  Commonly known as:  NEURONTIN  Take 2 capsules (200 mg total) by mouth 3 (three) times daily. Pt okay to take only once a day     ibuprofen 200 MG tablet  Commonly known as:  ADVIL,MOTRIN  Take 200-400 mg by mouth every 6 (six) hours as needed.     lidocaine-prilocaine cream  Commonly known as:  EMLA  Apply 1 application topically as needed. Apply quarter sized amount to portacath site 1-2 hours prior to chemotherapy appt.  Cover with saran wrap.     LORazepam 0.5 MG tablet  Commonly known as:  ATIVAN  Take 1 tablet (0.5 mg total) by mouth as needed (Nausea or vomiting).     oxymetazoline 0.05 % nasal spray  Commonly known as:  AFRIN  Place 1 spray into both nostrils 2 (two) times daily as needed for congestion.        Allergies:  Allergies  Allergen Reactions  . Bactrim [Sulfamethoxazole-Trimethoprim] Diarrhea    Has taken 2 different times and both times frequent diarrhea  . Lyrica [Pregabalin]     "thought I was going to die"  . Zofran Alvis Lemmings  Hcl] Other (See Comments)    Hiccups    Past Medical History, Surgical history, Social history, and Family History were reviewed and updated.  Review of Systems: All other 10 point review of systems is negative.   Physical Exam:  height is 5' 5"  (1.651 m) and weight is 203 lb (92.08 kg). His oral temperature is 98 F (36.7 C). His blood pressure is 157/87 and his pulse is 80. His respiration is 16.   Wt Readings from Last 3 Encounters:  05/16/15 203 lb (92.08 kg)  04/19/15 204 lb (92.534 kg)  03/21/15 205 lb (92.987 kg)    Ocular: Sclerae unicteric, pupils equal, round and reactive to light Ear-nose-throat: Oropharynx clear, dentition fair Lymphatic: No cervical supraclavicular or axillary adenopathy Lungs no rales or rhonchi, good excursion bilaterally Heart regular rate and rhythm, no  murmur appreciated Abd soft, nontender, positive bowel sounds MSK no focal spinal tenderness, no joint edema Neuro: non-focal, well-oriented, appropriate affect Breasts: Deferred  Lab Results  Component Value Date   WBC 8.0 05/16/2015   HGB 15.9 05/16/2015   HCT 45.3 05/16/2015   MCV 94 05/16/2015   PLT 111* 05/16/2015   No results found for: FERRITIN, IRON, TIBC, UIBC, IRONPCTSAT Lab Results  Component Value Date   RBC 4.83 05/16/2015   No results found for: KPAFRELGTCHN, LAMBDASER, KAPLAMBRATIO No results found for: IGGSERUM, IGA, IGMSERUM No results found for: Odetta Pink, SPEI   Chemistry      Component Value Date/Time   NA 136 05/16/2015 1057   NA 139 02/14/2015 0753   NA 137 01/26/2015 0815   K 4.0 05/16/2015 1057   K 4.4 02/14/2015 0753   K 4.1 01/26/2015 0815   CL 101 05/16/2015 1057   CL 104 01/26/2015 0815   CO2 25 05/16/2015 1057   CO2 24 02/14/2015 0753   CO2 26 01/26/2015 0815   BUN 13 05/16/2015 1057   BUN 18.1 02/14/2015 0753   BUN 14 01/26/2015 0815   CREATININE 0.7 05/16/2015 1057   CREATININE 0.9 02/14/2015 0753   CREATININE 0.82 01/26/2015 0815      Component Value Date/Time   CALCIUM 9.5 05/16/2015 1057   CALCIUM 9.6 02/14/2015 0753   CALCIUM 9.3 01/26/2015 0815   ALKPHOS 89* 05/16/2015 1057   ALKPHOS 115 02/14/2015 0753   ALKPHOS 98 01/26/2015 0815   AST 35 05/16/2015 1057   AST 27 02/14/2015 0753   AST 31 01/26/2015 0815   ALT 26 05/16/2015 1057   ALT 28 02/14/2015 0753   ALT 24 01/26/2015 0815   BILITOT 1.10 05/16/2015 1057   BILITOT 0.73 02/14/2015 0753   BILITOT 1.0 01/26/2015 0815     Impression and Plan: Mr. Barrack is 64 year old gentleman with metastatic colon cancer. His PET scan last week showed slight improvement to right liver lobe lets and slight increase in hypermetabolism of the left liver lobe. No new areas of metastatic disease were noted. He did have increased mild  hypermetabolism within the left peripheral zone of the prostate apex.  His CEA in november was down to 20.4. PSA was added to his lab work.  I spoke with Dr. Marin Olp and it was decided that we would proceed with cycle 6 today as planned.  He has his follow-up appointment scheduled.  He will contact us with any questions or concerns. We can certainly see him sooner if need be.   Eliezer Bottom, NP 12/13/20164:25 PM

## 2015-05-17 LAB — CEA: CEA: 23.9 ng/mL — ABNORMAL HIGH (ref 0.0–5.0)

## 2015-05-18 ENCOUNTER — Other Ambulatory Visit: Payer: Self-pay | Admitting: Lab

## 2015-05-18 DIAGNOSIS — C787 Secondary malignant neoplasm of liver and intrahepatic bile duct: Principal | ICD-10-CM

## 2015-05-18 DIAGNOSIS — C189 Malignant neoplasm of colon, unspecified: Secondary | ICD-10-CM

## 2015-05-18 LAB — PSA: PSA: 1.85 ng/mL (ref <=4.00)

## 2015-05-19 ENCOUNTER — Telehealth: Payer: Self-pay | Admitting: Hematology & Oncology

## 2015-05-19 NOTE — Telephone Encounter (Signed)
Submit Prior Authorization   Your prior authorization for chemotherapy has been approved. Your case reference number is [id number].        Provider Name:  Dr. Burney Gauze  Contact:  Baxter Flattery   Provider Address:  [address] [place], [place] [id number]  Phone Number:  [phone]     Fax Number:  [phone]     Patient Name:  Ryan Maynard  Patient Id:  [id number]   Insurance Carrier:  [id number]       Primary Diagnosis Code:  C18.9  Description:  Malignant neoplasm of colon, unspecified   Secondary Diagnosis Code:  C78.7  Description:  Secondary malignant neoplasm of liver and intrahepatic bile duct   Start Date:  06/13/2015  Request Type:  Additional Time   [facility] code(s)  [id number], [id number], [id number], [id number], [phone]  Description:  Levoleucovorin Clide [place]ales), Oxaliplatin (Eloxatin), 5-Fluorouracil (Adrucil, 5FU), Bevacizumab (Avastin), Leucovorin - inj   Authorization Number:  [id number]     Review Date:  05/19/2015 10:08:08 AM     Expiration Date:  06/12/2016     Status:  Your case has been Approved.

## 2015-06-13 ENCOUNTER — Ambulatory Visit (HOSPITAL_BASED_OUTPATIENT_CLINIC_OR_DEPARTMENT_OTHER): Payer: BLUE CROSS/BLUE SHIELD | Admitting: Hematology & Oncology

## 2015-06-13 ENCOUNTER — Other Ambulatory Visit: Payer: Self-pay | Admitting: Hematology & Oncology

## 2015-06-13 ENCOUNTER — Other Ambulatory Visit (HOSPITAL_BASED_OUTPATIENT_CLINIC_OR_DEPARTMENT_OTHER): Payer: BLUE CROSS/BLUE SHIELD

## 2015-06-13 ENCOUNTER — Ambulatory Visit (HOSPITAL_BASED_OUTPATIENT_CLINIC_OR_DEPARTMENT_OTHER): Payer: BLUE CROSS/BLUE SHIELD

## 2015-06-13 VITALS — BP 147/94 | HR 75 | Temp 98.2°F | Resp 18

## 2015-06-13 DIAGNOSIS — Z5111 Encounter for antineoplastic chemotherapy: Secondary | ICD-10-CM

## 2015-06-13 DIAGNOSIS — C787 Secondary malignant neoplasm of liver and intrahepatic bile duct: Principal | ICD-10-CM

## 2015-06-13 DIAGNOSIS — M0760 Enteropathic arthropathies, unspecified site: Principal | ICD-10-CM

## 2015-06-13 DIAGNOSIS — C189 Malignant neoplasm of colon, unspecified: Secondary | ICD-10-CM

## 2015-06-13 DIAGNOSIS — R109 Unspecified abdominal pain: Secondary | ICD-10-CM

## 2015-06-13 DIAGNOSIS — M25562 Pain in left knee: Secondary | ICD-10-CM

## 2015-06-13 DIAGNOSIS — K639 Disease of intestine, unspecified: Secondary | ICD-10-CM

## 2015-06-13 DIAGNOSIS — Z79899 Other long term (current) drug therapy: Secondary | ICD-10-CM

## 2015-06-13 LAB — CBC WITH DIFFERENTIAL (CANCER CENTER ONLY)
BASO#: 0.1 10*3/uL (ref 0.0–0.2)
BASO%: 1.7 % (ref 0.0–2.0)
EOS%: 2.2 % (ref 0.0–7.0)
Eosinophils Absolute: 0.1 10*3/uL (ref 0.0–0.5)
HEMATOCRIT: 42.6 % (ref 38.7–49.9)
HEMOGLOBIN: 14.8 g/dL (ref 13.0–17.1)
LYMPH#: 1.1 10*3/uL (ref 0.9–3.3)
LYMPH%: 26.3 % (ref 14.0–48.0)
MCH: 33.3 pg (ref 28.0–33.4)
MCHC: 34.7 g/dL (ref 32.0–35.9)
MCV: 96 fL (ref 82–98)
MONO#: 0.6 10*3/uL (ref 0.1–0.9)
MONO%: 15.3 % — ABNORMAL HIGH (ref 0.0–13.0)
NEUT%: 54.5 % (ref 40.0–80.0)
NEUTROS ABS: 2.2 10*3/uL (ref 1.5–6.5)
Platelets: 102 10*3/uL — ABNORMAL LOW (ref 145–400)
RBC: 4.45 10*6/uL (ref 4.20–5.70)
RDW: 15.5 % (ref 11.1–15.7)
WBC: 4.1 10*3/uL (ref 4.0–10.0)

## 2015-06-13 LAB — CMP (CANCER CENTER ONLY)
ALBUMIN: 3.6 g/dL (ref 3.3–5.5)
ALK PHOS: 85 U/L — AB (ref 26–84)
ALT: 25 U/L (ref 10–47)
AST: 34 U/L (ref 11–38)
BILIRUBIN TOTAL: 1 mg/dL (ref 0.20–1.60)
BUN, Bld: 15 mg/dL (ref 7–22)
CALCIUM: 8.9 mg/dL (ref 8.0–10.3)
CO2: 26 meq/L (ref 18–33)
CREATININE: 1 mg/dL (ref 0.6–1.2)
Chloride: 103 mEq/L (ref 98–108)
Glucose, Bld: 104 mg/dL (ref 73–118)
Potassium: 3.7 mEq/L (ref 3.3–4.7)
SODIUM: 138 meq/L (ref 128–145)
TOTAL PROTEIN: 7.5 g/dL (ref 6.4–8.1)

## 2015-06-13 LAB — UA PROTEIN, DIPSTICK - CHCC SATELLITE: PROTEIN, URINE: NEGATIVE mg/dL

## 2015-06-13 MED ORDER — SODIUM CHLORIDE 0.9 % IJ SOLN
10.0000 mL | INTRAMUSCULAR | Status: DC | PRN
  Administered 2015-06-13: 10 mL
  Filled 2015-06-13: qty 10

## 2015-06-13 MED ORDER — SODIUM CHLORIDE 0.9 % IV SOLN
7.7000 mg/kg | Freq: Once | INTRAVENOUS | Status: AC
  Administered 2015-06-13: 700 mg via INTRAVENOUS
  Filled 2015-06-13: qty 16

## 2015-06-13 MED ORDER — DICLOFENAC SODIUM 1 % TD GEL: 2.0000 g | Freq: Four times a day (QID) | TRANSDERMAL | Status: DC

## 2015-06-13 MED ORDER — SODIUM CHLORIDE 0.9 % IV SOLN
Freq: Once | INTRAVENOUS | Status: AC
  Administered 2015-06-13: 11:00:00 via INTRAVENOUS
  Filled 2015-06-13: qty 4

## 2015-06-13 MED ORDER — HEPARIN SOD (PORK) LOCK FLUSH 100 UNIT/ML IV SOLN
500.0000 [IU] | Freq: Once | INTRAVENOUS | Status: AC | PRN
  Administered 2015-06-13: 500 [IU]
  Filled 2015-06-13: qty 5

## 2015-06-13 MED ORDER — KETOROLAC TROMETHAMINE 15 MG/ML IJ SOLN
INTRAMUSCULAR | Status: AC
  Filled 2015-06-13: qty 2

## 2015-06-13 MED ORDER — KETOROLAC TROMETHAMINE 30 MG/ML IJ SOLN
30.0000 mg | Freq: Once | INTRAMUSCULAR | Status: DC
  Administered 2015-06-13: 30 mg via INTRAVENOUS
  Filled 2015-06-13: qty 1

## 2015-06-13 MED ORDER — DEXTROSE 5 % IV SOLN
Freq: Once | INTRAVENOUS | Status: AC
  Administered 2015-06-13: 11:00:00 via INTRAVENOUS

## 2015-06-13 MED ORDER — OXALIPLATIN CHEMO INJECTION 100 MG/20ML
97.0000 mg/m2 | Freq: Once | INTRAVENOUS | Status: AC
  Administered 2015-06-13: 200 mg via INTRAVENOUS
  Filled 2015-06-13: qty 40

## 2015-06-13 MED ORDER — SODIUM CHLORIDE 0.9 % IV SOLN
Freq: Once | INTRAVENOUS | Status: AC
  Administered 2015-06-13: 10:00:00 via INTRAVENOUS

## 2015-06-13 NOTE — Patient Instructions (Signed)
[facility] Discharge Instructions for Patients Receiving Chemotherapy  Today you received the following chemotherapy agents Oxaliplatin and Avastin  To help prevent nausea and vomiting after your treatment, we encourage you to take your nausea medication    If you develop nausea and vomiting that is not controlled by your nausea medication, call the clinic.   BELOW ARE SYMPTOMS THAT SHOULD BE REPORTED IMMEDIATELY:  *FEVER GREATER THAN 100.5 F  *CHILLS WITH OR WITHOUT FEVER  NAUSEA AND VOMITING THAT IS NOT CONTROLLED WITH YOUR NAUSEA MEDICATION  *UNUSUAL SHORTNESS OF BREATH  *UNUSUAL BRUISING OR BLEEDING  TENDERNESS IN MOUTH AND THROAT WITH OR WITHOUT PRESENCE OF ULCERS  *URINARY PROBLEMS  *BOWEL PROBLEMS  UNUSUAL RASH Items with * indicate a potential emergency and should be followed up as soon as possible.  Feel free to call the clinic you have any questions or concerns. The clinic phone number is [phone].  Please show the CHEMO ALERT CARD at check-in to the Emergency Department and triage nurse.   

## 2015-06-14 LAB — CEA (PARALLEL TESTING): CEA: 38.1 ng/mL — ABNORMAL HIGH (ref 0.0–5.0)

## 2015-06-14 LAB — CEA: CEA1: 43.9 ng/mL — AB (ref 0.0–4.7)

## 2015-06-14 NOTE — Progress Notes (Signed)
Hematology and Oncology Follow Up Visit  Ryan Maynard [id number] 03/28/1951 65 y.o. 06/14/2015   Principle Diagnosis:  Metastatic colon cancer - K-RAS wild-type - progressive  Current Therapy:   XELOX/Avastin s/p cycle 5 Status post RFA of liver metastases    Interim History:  Ryan Maynard is here today for a follow-up and cycle 6 of treatment. He is having some issues with occasional abdominal pain. I'm not sure as to why he's had the pain. He actually went to the emergency room for this. Nothing was really done for him. No x-rays were done as far as I can tell. Laboratory looked okay.  I have to suspect that somehow the discomfort is related to his underlying malignancy. It almost sounds like he's had intermittent bowel obstruction which seems to resolve on its own.  The pain seems to come on without warning. Last time he had pain it was at nighttime. He had no nausea or vomiting. He is not constipated. There is no diarrhea.  His last CEA was 24. We will see what it is today.  His appetite seems to be doing pretty well. He's had no fever. He's had no bleeding. He's had no rashes. He's had no leg swelling.  His left knee bothers him. He says this happens when he takes Xeloda. His only his left knee.  I will go ahead and give him a dose of IV Toradol and then give me a prescription for some Voltaren gel.   Today is actually his anniversary.  Overall, his performance status is ECOG 1. Left  Medications: (   Medication List       This list is accurate as of: 06/13/15 11:59 PM.  Always use your most recent med list.               amLODipine-benazepril 5-20 MG capsule  Commonly known as:  LOTREL  Take 1 capsule by mouth daily.     capecitabine 500 MG tablet  Commonly known as:  XELODA  Take 3 tablets (1,500 mg total) by mouth 2 (two) times daily after a meal. Take for 14 days, off 7 days.     Coenzyme Q10 10 MG capsule  Take 10 mg by mouth daily.     diclofenac sodium 1 %  Gel  Commonly known as:  VOLTAREN  Apply 2 g topically 4 (four) times daily.     gabapentin 100 MG capsule  Commonly known as:  NEURONTIN  Take 2 capsules (200 mg total) by mouth 3 (three) times daily. Pt okay to take only once a day     ibuprofen 200 MG tablet  Commonly known as:  ADVIL,MOTRIN  Take 200-400 mg by mouth every 6 (six) hours as needed.     lidocaine-prilocaine cream  Commonly known as:  EMLA  Apply 1 application topically as needed. Apply quarter sized amount to portacath site 1-2 hours prior to chemotherapy appt.  Cover with saran wrap.     LORazepam 0.5 MG tablet  Commonly known as:  ATIVAN  Take 1 tablet (0.5 mg total) by mouth as needed (Nausea or vomiting).     oxymetazoline 0.05 % nasal spray  Commonly known as:  AFRIN  Place 1 spray into both nostrils 2 (two) times daily as needed for congestion.        Allergies:  Allergies  Allergen Reactions  . Bactrim [Sulfamethoxazole-Trimethoprim] Diarrhea    Has taken 2 different times and both times frequent diarrhea  . Lyrica [Pregabalin]     "  thought I was going to die"  . Zofran [Ondansetron Hcl] Other (See Comments)    Hiccups    Past Medical History, Surgical history, Social history, and Family History were reviewed and updated.  Review of Systems: All other 10 point review of systems is negative.   Physical Exam:  vitals were not taken for this visit.  Wt Readings from Last 3 Encounters:  05/16/15 203 lb (92.08 kg)  04/19/15 204 lb (92.534 kg)  03/21/15 205 lb (92.987 kg)    Well-developed and well-nourished white male in no obvious distress. Head and neck exam shows no ocular or oral lesions. There are no palpable cervical or supraclavicular lymph nodes. Lungs are clear. Cardiac exam regular rate and rhythm with no murmurs, rubs or bruits. Abdomen is soft. There may be some slight distention. Bowel sounds are present. He has no guarding or rebound tenderness. No obvious abdominal masses noted.  He has no hepatomegaly. Back exam shows no tenderness over the spine, ribs or hips. Externally shows no clubbing, cyanosis or edema. There may be some slight swelling in the left knee. He has good range of motion of the knee. Skin exam shows no rashes, ecchymosis or petechia. Neurological exam is nonfocal.   Lab Results  Component Value Date   WBC 4.1 06/13/2015   HGB 14.8 06/13/2015   HCT 42.6 06/13/2015   MCV 96 06/13/2015   PLT 102* 06/13/2015   No results found for: FERRITIN, IRON, TIBC, UIBC, IRONPCTSAT Lab Results  Component Value Date   RBC 4.45 06/13/2015   No results found for: KPAFRELGTCHN, LAMBDASER, KAPLAMBRATIO No results found for: IGGSERUM, IGA, IGMSERUM No results found for: Kathrynn Ducking, MSPIKE, SPEI   Chemistry      Component Value Date/Time   NA 138 06/13/2015 0856   NA 139 02/14/2015 0753   NA 137 01/26/2015 0815   K 3.7 06/13/2015 0856   K 4.4 02/14/2015 0753   K 4.1 01/26/2015 0815   CL 103 06/13/2015 0856   CL 104 01/26/2015 0815   CO2 26 06/13/2015 0856   CO2 24 02/14/2015 0753   CO2 26 01/26/2015 0815   BUN 15 06/13/2015 0856   BUN 18.1 02/14/2015 0753   BUN 14 01/26/2015 0815   CREATININE 1.0 06/13/2015 0856   CREATININE 0.9 02/14/2015 0753   CREATININE 0.82 01/26/2015 0815      Component Value Date/Time   CALCIUM 8.9 06/13/2015 0856   CALCIUM 9.6 02/14/2015 0753   CALCIUM 9.3 01/26/2015 0815   ALKPHOS 85* 06/13/2015 0856   ALKPHOS 115 02/14/2015 0753   ALKPHOS 98 01/26/2015 0815   AST 34 06/13/2015 0856   AST 27 02/14/2015 0753   AST 31 01/26/2015 0815   ALT 25 06/13/2015 0856   ALT 28 02/14/2015 0753   ALT 24 01/26/2015 0815   BILITOT 1.00 06/13/2015 0856   BILITOT 0.73 02/14/2015 0753   BILITOT 1.0 01/26/2015 0815     Impression and Plan: Ryan Maynard is 65 year old gentleman with metastatic colon cancer.   His CEA today today was 38.1. This is going up.  He is fairly asymptomatic.  I'm not sure what this abdominal issue is. I suppose it could be related to the underlying malignancy.  He will be due for some scans soon.  I think we probably will have to get some scans on him after his next cycle of treatment.  If we do find that he has progressive disease, then we will definitely need  to make an adjustment with his protocol. He is not yet irinotecan. He's not been on Erbitux. He's not had Vectibix.  I spent about 40 minutes with him. Hopefully, his left knee will feel a little bit better.  We will plan to get him back in one month.   Volanda Napoleon, MD 1/11/20177:35 AM

## 2015-06-19 ENCOUNTER — Telehealth: Payer: Self-pay

## 2015-06-19 NOTE — Telephone Encounter (Signed)
Received call from pt's wife Zigmund Daniel reporting that pt has been in "terrible pain" all weekend and is requesting to see Dr Marin Olp today.  Zigmund Daniel aware that Dr Ginette Pitman is out of the office today but will return tomorrow, however I cannot confirm that pt can be seen tomorrow.   Zigmund Daniel states that pt is classifying his pain as "a lot worse than 10." Describes pain as crampy in nature & has been "medium" all weekend with episodes of "severe." Denies changes in bowel pattern or nausea although she states that he is "afraid to eat and drink, so he probably needs fluids." Spoke with Judson Roch, NP who recommends pt go the ED. Zigmund Daniel states that she tried to get patient to go to the ER this weekend but he refused and wanted to wait and see Dr Marin Olp. Encouraged her that pt could at least get some symptom management until he can be seen at our office and urgent medical needs could be ruled out. She states she will "try to get him to get him to go."   Aware that note will be left for MD and RN for tomorrow. dph

## 2015-06-20 ENCOUNTER — Other Ambulatory Visit (HOSPITAL_BASED_OUTPATIENT_CLINIC_OR_DEPARTMENT_OTHER): Payer: BLUE CROSS/BLUE SHIELD

## 2015-06-20 ENCOUNTER — Encounter: Payer: Self-pay | Admitting: Hematology & Oncology

## 2015-06-20 ENCOUNTER — Ambulatory Visit (HOSPITAL_BASED_OUTPATIENT_CLINIC_OR_DEPARTMENT_OTHER): Payer: BLUE CROSS/BLUE SHIELD | Admitting: Hematology & Oncology

## 2015-06-20 ENCOUNTER — Other Ambulatory Visit: Payer: Self-pay | Admitting: *Deleted

## 2015-06-20 ENCOUNTER — Ambulatory Visit (HOSPITAL_BASED_OUTPATIENT_CLINIC_OR_DEPARTMENT_OTHER)
Admission: RE | Admit: 2015-06-20 | Discharge: 2015-06-20 | Disposition: A | Discharge status: Home / Self Care | Payer: BLUE CROSS/BLUE SHIELD | Source: Ambulatory Visit | Attending: Hematology & Oncology | Admitting: Hematology & Oncology

## 2015-06-20 VITALS — BP 129/82 | HR 100 | Temp 99.1°F | Resp 16 | Ht 65.0 in | Wt 194.0 lb

## 2015-06-20 DIAGNOSIS — R112 Nausea with vomiting, unspecified: Secondary | ICD-10-CM | POA: Diagnosis not present

## 2015-06-20 DIAGNOSIS — N281 Cyst of kidney, acquired: Secondary | ICD-10-CM | POA: Insufficient documentation

## 2015-06-20 DIAGNOSIS — R7989 Other specified abnormal findings of blood chemistry: Secondary | ICD-10-CM

## 2015-06-20 DIAGNOSIS — R1013 Epigastric pain: Secondary | ICD-10-CM | POA: Diagnosis not present

## 2015-06-20 DIAGNOSIS — C189 Malignant neoplasm of colon, unspecified: Secondary | ICD-10-CM | POA: Insufficient documentation

## 2015-06-20 DIAGNOSIS — C787 Secondary malignant neoplasm of liver and intrahepatic bile duct: Secondary | ICD-10-CM | POA: Insufficient documentation

## 2015-06-20 DIAGNOSIS — R938 Abnormal findings on diagnostic imaging of other specified body structures: Secondary | ICD-10-CM | POA: Insufficient documentation

## 2015-06-20 LAB — CMP (CANCER CENTER ONLY)
ALT: 88 U/L — AB (ref 10–47)
AST: 76 U/L — ABNORMAL HIGH (ref 11–38)
Albumin: 3.5 g/dL (ref 3.3–5.5)
Alkaline Phosphatase: 171 U/L — ABNORMAL HIGH (ref 26–84)
BUN: 26 mg/dL — AB (ref 7–22)
CHLORIDE: 98 meq/L (ref 98–108)
CO2: 26 mEq/L (ref 18–33)
Calcium: 9.1 mg/dL (ref 8.0–10.3)
Creat: 1 mg/dl (ref 0.6–1.2)
GLUCOSE: 109 mg/dL (ref 73–118)
POTASSIUM: 3.8 meq/L (ref 3.3–4.7)
Sodium: 134 mEq/L (ref 128–145)
Total Bilirubin: 2.1 mg/dl — ABNORMAL HIGH (ref 0.20–1.60)
Total Protein: 7.8 g/dL (ref 6.4–8.1)

## 2015-06-20 LAB — CBC WITH DIFFERENTIAL (CANCER CENTER ONLY)
BASO#: 0 10*3/uL (ref 0.0–0.2)
BASO%: 0.2 % (ref 0.0–2.0)
EOS%: 1.1 % (ref 0.0–7.0)
Eosinophils Absolute: 0.1 10*3/uL (ref 0.0–0.5)
HCT: 43.5 % (ref 38.7–49.9)
HGB: 15.3 g/dL (ref 13.0–17.1)
LYMPH#: 1.3 10*3/uL (ref 0.9–3.3)
LYMPH%: 9.6 % — AB (ref 14.0–48.0)
MCH: 34.1 pg — AB (ref 28.0–33.4)
MCHC: 35.2 g/dL (ref 32.0–35.9)
MCV: 97 fL (ref 82–98)
MONO#: 1.6 10*3/uL — AB (ref 0.1–0.9)
MONO%: 12.4 % (ref 0.0–13.0)
NEUT#: 10.1 10*3/uL — ABNORMAL HIGH (ref 1.5–6.5)
NEUT%: 76.7 % (ref 40.0–80.0)
PLATELETS: 113 10*3/uL — AB (ref 145–400)
RBC: 4.49 10*6/uL (ref 4.20–5.70)
RDW: 14.9 % (ref 11.1–15.7)
WBC: 13.1 10*3/uL — ABNORMAL HIGH (ref 4.0–10.0)

## 2015-06-20 MED ORDER — OXYCODONE-ACETAMINOPHEN 5-325 MG PO TABS: 1.0000 | ORAL_TABLET | Freq: Three times a day (TID) | ORAL | Status: DC | PRN

## 2015-06-20 NOTE — Progress Notes (Signed)
Hematology and Oncology Follow Up Visit  Ryan Maynard [id number] 02/17/1951 65 y.o. 06/20/2015   Principle Diagnosis:  Metastatic colon cancer - K-RAS wild-type - progressive  Current Therapy:   XELOX/Avastin s/p cycle 5 Status post RFA of liver metastases    Interim History:  Ryan Maynard is here today for an unscheduled visit. He has been having abdominal pain. He had a lot of abdominal pain over the weekend. He said he began to feel a little queasy on Friday and then began have a lot of epigastric pain on Saturday. He said the pain was severe. It did not radiate. He cannot find a comfortable position. The pain was not made worse or better by standing. It may be a little bit better by bending over. He had some diarrhea today. He had a little bit of nausea but no vomiting. Has some "dry heaves" I did yesterday.  Of note, he began to use Voltaern gel on his left knee. I will know this may have caused some gastritis.  I told him to try some over-the-counter Pepcid. I told her to take 40 mg twice a day.  He did take some Percocet for the pain. This did begin to help on Sunday.   He is eating better today.  He has no cough or shortness of breath. He has no leg swelling. He has no rash.  Unfortunately, his last CEA was up to 38. As such, we have to worry that some of this abdominal pain might be from his underlying malignancy.  He is on Avastin. It is also possible that Avastin might have caused some of the discomfort.  Overall, his performance status is ECOG 1.   Medications: (   Medication List       This list is accurate as of: 06/20/15  3:23 PM.  Always use your most recent med list.               amLODipine-benazepril 5-20 MG capsule  Commonly known as:  LOTREL  Take 1 capsule by mouth daily.     capecitabine 500 MG tablet  Commonly known as:  XELODA  Take 3 tablets (1,500 mg total) by mouth 2 (two) times daily after a meal. Take for 14 days, off 7 days.     Coenzyme  Q10 10 MG capsule  Take 10 mg by mouth daily.     diclofenac sodium 1 % Gel  Commonly known as:  VOLTAREN  Apply 2 g topically 4 (four) times daily.     gabapentin 100 MG capsule  Commonly known as:  NEURONTIN  Take 2 capsules (200 mg total) by mouth 3 (three) times daily. Pt okay to take only once a day     ibuprofen 200 MG tablet  Commonly known as:  ADVIL,MOTRIN  Take 200-400 mg by mouth every 6 (six) hours as needed.     lidocaine-prilocaine cream  Commonly known as:  EMLA  Apply 1 application topically as needed. Apply quarter sized amount to portacath site 1-2 hours prior to chemotherapy appt.  Cover with saran wrap.     LORazepam 0.5 MG tablet  Commonly known as:  ATIVAN  Take 1 tablet (0.5 mg total) by mouth as needed (Nausea or vomiting).     oxyCODONE-acetaminophen 5-325 MG tablet  Commonly known as:  ROXICET  Take 1-2 tablets by mouth every 8 (eight) hours as needed for severe pain.     oxymetazoline 0.05 % nasal spray  Commonly known as:  Melissa Montane  Place 1 spray into both nostrils 2 (two) times daily as needed for congestion.        Allergies:  Allergies  Allergen Reactions  . Bactrim [Sulfamethoxazole-Trimethoprim] Diarrhea    Has taken 2 different times and both times frequent diarrhea  . Lyrica [Pregabalin]     "thought I was going to die"  . Zofran [Ondansetron Hcl] Other (See Comments)    Hiccups    Past Medical History, Surgical history, Social history, and Family History were reviewed and updated.  Review of Systems: All other 10 point review of systems is negative.   Physical Exam:  height is 5' 5"  (1.651 m) and weight is 194 lb (87.998 kg). His oral temperature is 99.1 F (37.3 C). His blood pressure is 129/82 and his pulse is 100. His respiration is 16.   Wt Readings from Last 3 Encounters:  06/20/15 194 lb (87.998 kg)  05/16/15 203 lb (92.08 kg)  04/19/15 204 lb (92.534 kg)    Well-developed and well-nourished white male in no obvious  distress. Head and neck exam shows no ocular or oral lesions. There are no palpable cervical or supraclavicular lymph nodes. Lungs are clear. Cardiac exam regular rate and rhythm with no murmurs, rubs or bruits. Abdomen is soft. There may be some slight distention. Bowel sounds are present. He has no guarding or rebound tenderness. No obvious abdominal masses noted. He has no hepatomegaly. Back exam shows no tenderness over the spine, ribs or hips. Externally shows no clubbing, cyanosis or edema. There may be some slight swelling in the left knee. He has good range of motion of the knee. Skin exam shows no rashes, ecchymosis or petechia. Neurological exam is nonfocal.   Lab Results  Component Value Date   WBC 13.1* 06/20/2015   HGB 15.3 06/20/2015   HCT 43.5 06/20/2015   MCV 97 06/20/2015   PLT 113* 06/20/2015   No results found for: FERRITIN, IRON, TIBC, UIBC, IRONPCTSAT Lab Results  Component Value Date   RBC 4.49 06/20/2015   No results found for: KPAFRELGTCHN, LAMBDASER, KAPLAMBRATIO No results found for: IGGSERUM, IGA, IGMSERUM No results found for: Odetta Pink, SPEI   Chemistry      Component Value Date/Time   NA 134 06/20/2015 1402   NA 139 02/14/2015 0753   NA 137 01/26/2015 0815   K 3.8 06/20/2015 1402   K 4.4 02/14/2015 0753   K 4.1 01/26/2015 0815   CL 98 06/20/2015 1402   CL 104 01/26/2015 0815   CO2 26 06/20/2015 1402   CO2 24 02/14/2015 0753   CO2 26 01/26/2015 0815   BUN 26* 06/20/2015 1402   BUN 18.1 02/14/2015 0753   BUN 14 01/26/2015 0815   CREATININE 1.0 06/20/2015 1402   CREATININE 0.9 02/14/2015 0753   CREATININE 0.82 01/26/2015 0815      Component Value Date/Time   CALCIUM 9.1 06/20/2015 1402   CALCIUM 9.6 02/14/2015 0753   CALCIUM 9.3 01/26/2015 0815   ALKPHOS 171* 06/20/2015 1402   ALKPHOS 115 02/14/2015 0753   ALKPHOS 98 01/26/2015 0815   AST 76* 06/20/2015 1402   AST 27 02/14/2015 0753    AST 31 01/26/2015 0815   ALT 88* 06/20/2015 1402   ALT 28 02/14/2015 0753   ALT 24 01/26/2015 0815   BILITOT 2.10* 06/20/2015 1402   BILITOT 0.73 02/14/2015 0753   BILITOT 1.0 01/26/2015 0815     Impression and Plan: Ryan Maynard is 65 year old gentleman  with metastatic colon cancer.   I am not sure exactly as to what this pain is from. The fact that his LFTs are now more elevated could represent cholecystitis. As such, we will see about an abdominal ultrasound today.  I talked to them about the possibility of his cancer getting worse. As such, if that is the case, then I think the option would be to consider FOLFIRI with possibly adding Erbitux. I know this might be more of a intense program for him. It might affect his quality of life. For him, his quality of life has been incredibly important for him.   We talked about the possibility of him going on disability. I will sorely write him a letter if this does happen.   I think that we do have to get another scan on him. I'll see about a PET scan next week.   I spent about 45 minutes with he and his wife.    Volanda Napoleon, MD 1/17/20173:23 PM

## 2015-06-21 ENCOUNTER — Telehealth: Payer: Self-pay | Admitting: Hematology & Oncology

## 2015-06-21 LAB — LACTATE DEHYDROGENASE: LDH: 238 U/L (ref 125–245)

## 2015-06-21 LAB — AMYLASE: Amylase, Serum: 91 U/L (ref 31–124)

## 2015-06-21 LAB — LIPASE: Lipase: 32 U/L (ref 0–59)

## 2015-06-21 NOTE — Telephone Encounter (Signed)
PET SCAN - [facility] w [employer]  Patient ID: [id number]  Time: Time: 06/21/2015 10:44 AM   Patient Name: Ryan Maynard, Ryan Maynard   Patient DOB: 1951/02/22   Physician Name: Burney Gauze  Physician Address: [address] [place], [place] [address]  Physician Tax [id number]   CPT Code: [phone]  This member's plan does not currently require notification or prior-authorization through the [facility] Notification or Prior-Authorization Program. Please contact a Customer Care Professional at [phone] if you believe the information returned to be in error.  Please print and retain a copy of this confirmation in the patient's medical record.   PET SCAN - [facility] w [employer] [place], per [employer]

## 2015-06-28 ENCOUNTER — Ambulatory Visit (HOSPITAL_COMMUNITY)
Admission: RE | Admit: 2015-06-28 | Discharge: 2015-06-28 | Disposition: A | Discharge status: Home / Self Care | Payer: BLUE CROSS/BLUE SHIELD | Source: Ambulatory Visit | Attending: Hematology & Oncology | Admitting: Hematology & Oncology

## 2015-06-28 DIAGNOSIS — C189 Malignant neoplasm of colon, unspecified: Secondary | ICD-10-CM | POA: Diagnosis present

## 2015-06-28 DIAGNOSIS — R599 Enlarged lymph nodes, unspecified: Secondary | ICD-10-CM | POA: Insufficient documentation

## 2015-06-28 DIAGNOSIS — C787 Secondary malignant neoplasm of liver and intrahepatic bile duct: Secondary | ICD-10-CM | POA: Diagnosis not present

## 2015-06-28 LAB — GLUCOSE, CAPILLARY: GLUCOSE-CAPILLARY: 86 mg/dL (ref 65–99)

## 2015-06-28 MED ORDER — FLUDEOXYGLUCOSE F - 18 (FDG) INJECTION
11.0000 | Freq: Once | INTRAVENOUS | Status: AC | PRN
  Administered 2015-06-28: 11 via INTRAVENOUS

## 2015-06-30 ENCOUNTER — Telehealth: Payer: Self-pay | Admitting: Hematology & Oncology

## 2015-06-30 NOTE — Telephone Encounter (Signed)
Request Key: [id number]  Rx #: [id number]  PA created date: 06/16/2015  Outcome: Approved

## 2015-07-03 ENCOUNTER — Other Ambulatory Visit (HOSPITAL_COMMUNITY): Payer: Self-pay | Admitting: General Surgery

## 2015-07-03 ENCOUNTER — Telehealth: Payer: Self-pay | Admitting: *Deleted

## 2015-07-03 DIAGNOSIS — K819 Cholecystitis, unspecified: Secondary | ICD-10-CM

## 2015-07-03 NOTE — Telephone Encounter (Signed)
Per Dr Antonieta Pert request, appointment for patient made with Dr Leane Para.  February 9th, 2017 10:30am  Patient notified along with office location and number.

## 2015-07-06 ENCOUNTER — Ambulatory Visit (HOSPITAL_COMMUNITY)
Admission: RE | Admit: 2015-07-06 | Discharge: 2015-07-06 | Disposition: A | Discharge status: Home / Self Care | Payer: BLUE CROSS/BLUE SHIELD | Source: Ambulatory Visit | Attending: General Surgery | Admitting: General Surgery

## 2015-07-06 DIAGNOSIS — K819 Cholecystitis, unspecified: Secondary | ICD-10-CM | POA: Diagnosis not present

## 2015-07-06 DIAGNOSIS — R110 Nausea: Secondary | ICD-10-CM | POA: Diagnosis not present

## 2015-07-06 DIAGNOSIS — R1013 Epigastric pain: Secondary | ICD-10-CM | POA: Diagnosis not present

## 2015-07-06 DIAGNOSIS — C189 Malignant neoplasm of colon, unspecified: Secondary | ICD-10-CM | POA: Insufficient documentation

## 2015-07-06 MED ORDER — MORPHINE SULFATE (PF) 4 MG/ML IV SOLN
INTRAVENOUS | Status: AC
  Filled 2015-07-06: qty 1

## 2015-07-06 MED ORDER — MORPHINE SULFATE (PF) 4 MG/ML IV SOLN
3.5300 mg | Freq: Once | INTRAVENOUS | Status: AC
Start: 2015-07-06 — End: 2015-07-06
  Administered 2015-07-06: 3.53 mg via INTRAVENOUS

## 2015-07-06 MED ORDER — TECHNETIUM TC 99M MEBROFENIN IV KIT
5.0000 | PACK | Freq: Once | INTRAVENOUS | Status: AC | PRN
  Administered 2015-07-06: 5 via INTRAVENOUS

## 2015-07-11 ENCOUNTER — Ambulatory Visit: Payer: BLUE CROSS/BLUE SHIELD | Admitting: Hematology & Oncology

## 2015-07-11 ENCOUNTER — Other Ambulatory Visit: Payer: BLUE CROSS/BLUE SHIELD

## 2015-07-11 ENCOUNTER — Ambulatory Visit: Payer: BLUE CROSS/BLUE SHIELD

## 2015-07-12 ENCOUNTER — Other Ambulatory Visit: Payer: BLUE CROSS/BLUE SHIELD

## 2015-07-12 ENCOUNTER — Ambulatory Visit: Payer: BLUE CROSS/BLUE SHIELD | Admitting: Hematology & Oncology

## 2015-07-13 ENCOUNTER — Ambulatory Visit (HOSPITAL_BASED_OUTPATIENT_CLINIC_OR_DEPARTMENT_OTHER): Payer: BLUE CROSS/BLUE SHIELD

## 2015-07-13 DIAGNOSIS — C189 Malignant neoplasm of colon, unspecified: Secondary | ICD-10-CM

## 2015-07-13 DIAGNOSIS — C787 Secondary malignant neoplasm of liver and intrahepatic bile duct: Principal | ICD-10-CM

## 2015-07-13 LAB — CBC WITH DIFFERENTIAL (CANCER CENTER ONLY)
BASO#: 0.1 10*3/uL (ref 0.0–0.2)
BASO%: 1 % (ref 0.0–2.0)
EOS%: 1.1 % (ref 0.0–7.0)
Eosinophils Absolute: 0.1 10*3/uL (ref 0.0–0.5)
HEMATOCRIT: 41.7 % (ref 38.7–49.9)
HGB: 14.4 g/dL (ref 13.0–17.1)
LYMPH#: 1.2 10*3/uL (ref 0.9–3.3)
LYMPH%: 19.7 % (ref 14.0–48.0)
MCH: 32.9 pg (ref 28.0–33.4)
MCHC: 34.5 g/dL (ref 32.0–35.9)
MCV: 95 fL (ref 82–98)
MONO#: 0.7 10*3/uL (ref 0.1–0.9)
MONO%: 10.4 % (ref 0.0–13.0)
NEUT#: 4.3 10*3/uL (ref 1.5–6.5)
NEUT%: 67.8 % (ref 40.0–80.0)
Platelets: 135 10*3/uL — ABNORMAL LOW (ref 145–400)
RBC: 4.38 10*6/uL (ref 4.20–5.70)
RDW: 13.7 % (ref 11.1–15.7)
WBC: 6.3 10*3/uL (ref 4.0–10.0)

## 2015-07-13 LAB — CMP (CANCER CENTER ONLY)
ALK PHOS: 198 U/L — AB (ref 26–84)
ALT(SGPT): 36 U/L (ref 10–47)
AST: 44 U/L — AB (ref 11–38)
Albumin: 3.4 g/dL (ref 3.3–5.5)
BILIRUBIN TOTAL: 0.8 mg/dL (ref 0.20–1.60)
BUN, Bld: 13 mg/dL (ref 7–22)
CALCIUM: 9.7 mg/dL (ref 8.0–10.3)
CO2: 28 mEq/L (ref 18–33)
Chloride: 104 mEq/L (ref 98–108)
Creat: 0.9 mg/dl (ref 0.6–1.2)
Glucose, Bld: 118 mg/dL (ref 73–118)
Potassium: 3.9 mEq/L (ref 3.3–4.7)
SODIUM: 139 meq/L (ref 128–145)
Total Protein: 8 g/dL (ref 6.4–8.1)

## 2015-07-14 LAB — CEA (PARALLEL TESTING): CEA: 44.6 ng/mL

## 2015-07-14 LAB — CEA: CEA: 59.3 ng/mL — ABNORMAL HIGH (ref 0.0–4.7)

## 2015-07-14 LAB — LACTATE DEHYDROGENASE: LDH: 216 U/L (ref 125–245)

## 2015-07-18 ENCOUNTER — Other Ambulatory Visit: Payer: Self-pay | Admitting: *Deleted

## 2015-07-18 DIAGNOSIS — C787 Secondary malignant neoplasm of liver and intrahepatic bile duct: Principal | ICD-10-CM

## 2015-07-18 DIAGNOSIS — R112 Nausea with vomiting, unspecified: Secondary | ICD-10-CM

## 2015-07-18 DIAGNOSIS — C189 Malignant neoplasm of colon, unspecified: Secondary | ICD-10-CM

## 2015-07-18 MED ORDER — OXYCODONE-ACETAMINOPHEN 5-325 MG PO TABS: 1.0000 | ORAL_TABLET | Freq: Three times a day (TID) | ORAL | Status: DC | PRN

## 2015-07-19 ENCOUNTER — Encounter: Payer: Self-pay | Admitting: *Deleted

## 2015-07-21 ENCOUNTER — Ambulatory Visit (HOSPITAL_BASED_OUTPATIENT_CLINIC_OR_DEPARTMENT_OTHER): Payer: BLUE CROSS/BLUE SHIELD | Admitting: Hematology & Oncology

## 2015-07-21 ENCOUNTER — Other Ambulatory Visit (HOSPITAL_BASED_OUTPATIENT_CLINIC_OR_DEPARTMENT_OTHER): Payer: BLUE CROSS/BLUE SHIELD

## 2015-07-21 ENCOUNTER — Encounter: Payer: Self-pay | Admitting: Hematology & Oncology

## 2015-07-21 VITALS — BP 144/92 | HR 71 | Temp 98.2°F | Resp 16 | Ht 65.0 in | Wt 190.0 lb

## 2015-07-21 DIAGNOSIS — C787 Secondary malignant neoplasm of liver and intrahepatic bile duct: Principal | ICD-10-CM

## 2015-07-21 DIAGNOSIS — C189 Malignant neoplasm of colon, unspecified: Secondary | ICD-10-CM

## 2015-07-21 DIAGNOSIS — K8681 Exocrine pancreatic insufficiency: Secondary | ICD-10-CM

## 2015-07-21 LAB — CBC WITH DIFFERENTIAL (CANCER CENTER ONLY)
BASO#: 0.1 10*3/uL (ref 0.0–0.2)
BASO%: 1.4 % (ref 0.0–2.0)
EOS%: 2 % (ref 0.0–7.0)
Eosinophils Absolute: 0.1 10*3/uL (ref 0.0–0.5)
HEMATOCRIT: 45.1 % (ref 38.7–49.9)
HEMOGLOBIN: 15.3 g/dL (ref 13.0–17.1)
LYMPH#: 1.3 10*3/uL (ref 0.9–3.3)
LYMPH%: 25.3 % (ref 14.0–48.0)
MCH: 32.1 pg (ref 28.0–33.4)
MCHC: 33.9 g/dL (ref 32.0–35.9)
MCV: 95 fL (ref 82–98)
MONO#: 0.6 10*3/uL (ref 0.1–0.9)
MONO%: 11.4 % (ref 0.0–13.0)
NEUT%: 59.9 % (ref 40.0–80.0)
NEUTROS ABS: 3.1 10*3/uL (ref 1.5–6.5)
Platelets: 135 10*3/uL — ABNORMAL LOW (ref 145–400)
RBC: 4.76 10*6/uL (ref 4.20–5.70)
RDW: 13.4 % (ref 11.1–15.7)
WBC: 5.1 10*3/uL (ref 4.0–10.0)

## 2015-07-21 LAB — LACTATE DEHYDROGENASE: LDH: 192 U/L (ref 125–245)

## 2015-07-21 LAB — CMP (CANCER CENTER ONLY)
ALBUMIN: 3.8 g/dL (ref 3.3–5.5)
ALK PHOS: 178 U/L — AB (ref 26–84)
ALT: 43 U/L (ref 10–47)
AST: 51 U/L — AB (ref 11–38)
BILIRUBIN TOTAL: 0.8 mg/dL (ref 0.20–1.60)
BUN, Bld: 13 mg/dL (ref 7–22)
CALCIUM: 9.3 mg/dL (ref 8.0–10.3)
CO2: 29 mEq/L (ref 18–33)
CREATININE: 1 mg/dL (ref 0.6–1.2)
Chloride: 103 mEq/L (ref 98–108)
Glucose, Bld: 86 mg/dL (ref 73–118)
Potassium: 4 mEq/L (ref 3.3–4.7)
Sodium: 139 mEq/L (ref 128–145)
TOTAL PROTEIN: 8.1 g/dL (ref 6.4–8.1)

## 2015-07-21 MED ORDER — PANCRELIPASE (LIP-PROT-AMYL) 24000-76000 UNITS PO CPEP: 1.0000 | ORAL_CAPSULE | Freq: Three times a day (TID) | ORAL | Status: DC

## 2015-07-22 LAB — CEA: CEA1: 70.3 ng/mL — AB (ref 0.0–4.7)

## 2015-07-28 NOTE — Progress Notes (Signed)
Hematology and Oncology Follow Up Visit  Ryan Maynard [id number] 1951-02-13 65 y.o. 07/28/2015   Principle Diagnosis:  Metastatic colon cancer - K-RAS wild-type - progressive  Current Therapy:   XELOX/Avastin s/p cycle 5 Status post RFA of liver metastases    Interim History:  Ryan Maynard is here today for follow-up. He was seen by Dr. Dalbert Batman of general surgery. It does not feel that a cholecystectomy is indicated. This would be a very difficult procedure given of the liver metastasis that Ryan Maynard has.  Thankfully, he has not had any problems with respect to recurrent right quadrant abdominal pain.  He has had a decent appetite. He's had no nausea or vomiting. His weight is holding fairly steady.  His CEA has continued to rise a little bit. His CEA today is 70.  His last PET scan was done about a month ago. Additional disease progression. He has a new liver lesion. There also is some periportal adenopathy. Thankfully, there is no disease above the diaphragm.  He tried undergo intrahepatic therapy. However, this cannot be accomplished. He did have some RFA done.  I talked to him about to systemic chemotherapy. He's been off chemotherapy for about 5 weeks.  He really is not sure about further chemotherapy. I told him that I would think that he would still be a with response. However, it would be a little more aggressive chemotherapy.  He does not want to be put back on a 5-FU pump. He will except Xeloda. As such, Xeloda with irinotecan would be reasonable.  He has not had Erbitux. I did this also would be a reasonable option to use.  He's had no diarrhea. He's had no constipation. He's had no leg swelling. He's had no joint issues.  Overall, his performance status is ECOG 1.  Medications: (   Medication List       This list is accurate as of: 07/21/15 11:59 PM.  Always use your most recent med list.               amLODipine-benazepril 5-20 MG capsule  Commonly known as:   LOTREL  Take 1 capsule by mouth daily.     capecitabine 500 MG tablet  Commonly known as:  XELODA  Take 3 tablets (1,500 mg total) by mouth 2 (two) times daily after a meal. Take for 14 days, off 7 days.     Coenzyme Q10 10 MG capsule  Take 10 mg by mouth daily.     diclofenac sodium 1 % Gel  Commonly known as:  VOLTAREN  Apply 2 g topically 4 (four) times daily.     gabapentin 100 MG capsule  Commonly known as:  NEURONTIN  Take 2 capsules (200 mg total) by mouth 3 (three) times daily. Pt okay to take only once a day     ibuprofen 200 MG tablet  Commonly known as:  ADVIL,MOTRIN  Take 200-400 mg by mouth every 6 (six) hours as needed.     lidocaine-prilocaine cream  Commonly known as:  EMLA  Apply 1 application topically as needed. Apply quarter sized amount to portacath site 1-2 hours prior to chemotherapy appt.  Cover with saran wrap.     LORazepam 0.5 MG tablet  Commonly known as:  ATIVAN  Take 1 tablet (0.5 mg total) by mouth as needed (Nausea or vomiting).     oxyCODONE-acetaminophen 5-325 MG tablet  Commonly known as:  ROXICET  Take 1-2 tablets by mouth every 8 (eight) hours as needed  for severe pain.     oxymetazoline 0.05 % nasal spray  Commonly known as:  AFRIN  Place 1 spray into both nostrils 2 (two) times daily as needed for congestion.     Pancrelipase (Lip-Prot-Amyl) 24000 units Cpep  Take 1 capsule (24,000 Units total) by mouth 3 (three) times daily before meals.        Allergies:  Allergies  Allergen Reactions  . Bactrim [Sulfamethoxazole-Trimethoprim] Diarrhea    Has taken 2 different times and both times frequent diarrhea  . Lyrica [Pregabalin]     "thought I was going to die"  . Zofran [Ondansetron Hcl] Other (See Comments)    Hiccups    Past Medical History, Surgical history, Social history, and Family History were reviewed and updated.  Review of Systems: All other 10 point review of systems is negative.   Physical Exam:  height is 5'  5" (1.651 m) and weight is 190 lb (86.183 kg). His oral temperature is 98.2 F (36.8 C). His blood pressure is 144/92 and his pulse is 71. His respiration is 16.   Wt Readings from Last 3 Encounters:  07/21/15 190 lb (86.183 kg)  06/20/15 194 lb (87.998 kg)  05/16/15 203 lb (92.08 kg)    Well-developed and well-nourished white male in no obvious distress. Head and neck exam shows no ocular or oral lesions. There are no palpable cervical or supraclavicular lymph nodes. Lungs are clear. Cardiac exam regular rate and rhythm with no murmurs, rubs or bruits. Abdomen is soft. There may be some slight distention. Bowel sounds are present. He has no guarding or rebound tenderness. No obvious abdominal masses noted. He has no hepatomegaly. Back exam shows no tenderness over the spine, ribs or hips. Externally shows no clubbing, cyanosis or edema. There may be some slight swelling in the left knee. He has good range of motion of the knee. Skin exam shows no rashes, ecchymosis or petechia. Neurological exam is nonfocal.   Lab Results  Component Value Date   WBC 5.1 07/21/2015   HGB 15.3 07/21/2015   HCT 45.1 07/21/2015   MCV 95 07/21/2015   PLT 135* 07/21/2015   No results found for: FERRITIN, IRON, TIBC, UIBC, IRONPCTSAT Lab Results  Component Value Date   RBC 4.76 07/21/2015   No results found for: KPAFRELGTCHN, LAMBDASER, KAPLAMBRATIO No results found for: IGGSERUM, IGA, IGMSERUM No results found for: Odetta Pink, SPEI   Chemistry      Component Value Date/Time   NA 139 07/21/2015 1039   NA 139 02/14/2015 0753   NA 137 01/26/2015 0815   K 4.0 07/21/2015 1039   K 4.4 02/14/2015 0753   K 4.1 01/26/2015 0815   CL 103 07/21/2015 1039   CL 104 01/26/2015 0815   CO2 29 07/21/2015 1039   CO2 24 02/14/2015 0753   CO2 26 01/26/2015 0815   BUN 13 07/21/2015 1039   BUN 18.1 02/14/2015 0753   BUN 14 01/26/2015 0815   CREATININE 1.0  07/21/2015 1039   CREATININE 0.9 02/14/2015 0753   CREATININE 0.82 01/26/2015 0815      Component Value Date/Time   CALCIUM 9.3 07/21/2015 1039   CALCIUM 9.6 02/14/2015 0753   CALCIUM 9.3 01/26/2015 0815   ALKPHOS 178* 07/21/2015 1039   ALKPHOS 115 02/14/2015 0753   ALKPHOS 98 01/26/2015 0815   AST 51* 07/21/2015 1039   AST 27 02/14/2015 0753   AST 31 01/26/2015 0815   ALT 43 07/21/2015  1039   ALT 28 02/14/2015 0753   ALT 24 01/26/2015 0815   BILITOT 0.80 07/21/2015 1039   BILITOT 0.73 02/14/2015 0753   BILITOT 1.0 01/26/2015 0815     Impression and Plan: Mr. Sabic is 65 year old gentleman with metastatic colon cancer.   Overall, I think he's done incredibly well. We first started treating him back in December 2015.  He, in my mind, would still respond to treatment. He has never had irinotecan. He has never had Erbitux. Granted, side effects might be more prominent with these. However, he would suddenly be able to really handle the side effects area I talked about side effects area he understands these well.  He is going to let us know when he wants to do. He has wife will think about things.  I told him that if he wanted to be put on permanent disability, he qualifies for. I will fill out the forms that he he's had filled out.  I spent about 45 minutes with he and his wife.    Volanda Napoleon, MD 2/24/20177:23 AM

## 2015-07-31 ENCOUNTER — Telehealth: Payer: Self-pay | Admitting: Hematology & Oncology

## 2015-07-31 NOTE — Telephone Encounter (Signed)
Pt's friend Ilda Foil) in ofc to pick up completed FMLA papers.           COPY SCANNED

## 2015-08-07 ENCOUNTER — Other Ambulatory Visit: Payer: Self-pay | Admitting: Emergency Medicine

## 2015-08-07 DIAGNOSIS — C787 Secondary malignant neoplasm of liver and intrahepatic bile duct: Principal | ICD-10-CM

## 2015-08-07 DIAGNOSIS — C189 Malignant neoplasm of colon, unspecified: Secondary | ICD-10-CM

## 2015-08-08 ENCOUNTER — Encounter: Payer: Self-pay | Admitting: Hematology & Oncology

## 2015-08-08 ENCOUNTER — Ambulatory Visit: Payer: BLUE CROSS/BLUE SHIELD

## 2015-08-08 ENCOUNTER — Other Ambulatory Visit (HOSPITAL_BASED_OUTPATIENT_CLINIC_OR_DEPARTMENT_OTHER): Payer: BLUE CROSS/BLUE SHIELD

## 2015-08-08 ENCOUNTER — Ambulatory Visit (HOSPITAL_BASED_OUTPATIENT_CLINIC_OR_DEPARTMENT_OTHER): Payer: BLUE CROSS/BLUE SHIELD | Admitting: Hematology & Oncology

## 2015-08-08 VITALS — BP 145/89 | HR 76 | Temp 98.0°F | Resp 16 | Ht 65.0 in | Wt 189.0 lb

## 2015-08-08 DIAGNOSIS — C787 Secondary malignant neoplasm of liver and intrahepatic bile duct: Principal | ICD-10-CM

## 2015-08-08 DIAGNOSIS — C189 Malignant neoplasm of colon, unspecified: Secondary | ICD-10-CM

## 2015-08-08 DIAGNOSIS — R112 Nausea with vomiting, unspecified: Secondary | ICD-10-CM

## 2015-08-08 DIAGNOSIS — K829 Disease of gallbladder, unspecified: Secondary | ICD-10-CM

## 2015-08-08 LAB — CBC WITH DIFFERENTIAL (CANCER CENTER ONLY)
BASO#: 0.1 10*3/uL (ref 0.0–0.2)
BASO%: 1.3 % (ref 0.0–2.0)
EOS%: 1.5 % (ref 0.0–7.0)
Eosinophils Absolute: 0.1 10*3/uL (ref 0.0–0.5)
HEMATOCRIT: 44.2 % (ref 38.7–49.9)
HGB: 15.5 g/dL (ref 13.0–17.1)
LYMPH#: 1.3 10*3/uL (ref 0.9–3.3)
LYMPH%: 24 % (ref 14.0–48.0)
MCH: 32.4 pg (ref 28.0–33.4)
MCHC: 35.1 g/dL (ref 32.0–35.9)
MCV: 93 fL (ref 82–98)
MONO#: 0.6 10*3/uL (ref 0.1–0.9)
MONO%: 11.9 % (ref 0.0–13.0)
NEUT%: 61.3 % (ref 40.0–80.0)
NEUTROS ABS: 3.3 10*3/uL (ref 1.5–6.5)
Platelets: 145 10*3/uL (ref 145–400)
RBC: 4.78 10*6/uL (ref 4.20–5.70)
RDW: 13 % (ref 11.1–15.7)
WBC: 5.4 10*3/uL (ref 4.0–10.0)

## 2015-08-08 LAB — CMP (CANCER CENTER ONLY)
ALK PHOS: 151 U/L — AB (ref 26–84)
ALT: 38 U/L (ref 10–47)
AST: 44 U/L — AB (ref 11–38)
Albumin: 3.7 g/dL (ref 3.3–5.5)
BILIRUBIN TOTAL: 1.1 mg/dL (ref 0.20–1.60)
BUN: 16 mg/dL (ref 7–22)
CALCIUM: 9.1 mg/dL (ref 8.0–10.3)
CO2: 30 mEq/L (ref 18–33)
Chloride: 102 mEq/L (ref 98–108)
Creat: 0.7 mg/dl (ref 0.6–1.2)
Glucose, Bld: 103 mg/dL (ref 73–118)
POTASSIUM: 3.9 meq/L (ref 3.3–4.7)
Sodium: 139 mEq/L (ref 128–145)
TOTAL PROTEIN: 7.6 g/dL (ref 6.4–8.1)

## 2015-08-08 MED ORDER — DOXYCYCLINE HYCLATE 100 MG PO TABS: 100.0000 mg | ORAL_TABLET | Freq: Two times a day (BID) | ORAL | Status: DC

## 2015-08-08 MED ORDER — OXYCODONE-ACETAMINOPHEN 5-325 MG PO TABS: 1.0000 | ORAL_TABLET | Freq: Three times a day (TID) | ORAL | Status: DC | PRN

## 2015-08-08 NOTE — Progress Notes (Signed)
Hematology and Oncology Follow Up Visit  Ryan Maynard [id number] Sep 14, 1950 65 y.o. 08/08/2015   Principle Diagnosis:  Metastatic colon cancer - K-RAS wild-type - progressive  Current Therapy:   XELOX/Avastin s/p cycle 5 XELIRI/Erbitux - start 3/13 Status post RFA of liver metastases    Interim History:  Ryan Maynard is here today for follow-up.he has decided on chemotherapy. He says he will try chemotherapy. Again, his quality of life is what he values most. He does not want chemotherapy to interfere with his quality of life.  He has finally quit work. This was very emotional for him. He really has enjoyed work. It is just gotten to the point where it is more than he can handle. I just feel bad for him. 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  He's not having any abdominal pain. He was having some gallbladder issues. However, the surgeon did not think that his gallbladder needed to come out.  His appetite is doing okay. He's had no problems with bowels or bladder. He has some cost spay some intermittent diarrhea.   He's not had any fever. He's had no bleeding.   I talked him about chemotherapy. He really does not want anything too aggressive. He wants chemotherapy once a month. We can adjust her dosing to try to accommodate this.   He does not want the 5-FU pump. As such, I think we can probably try Xeloda with irinotecan. I will reduce the irinotecan dose quite a bit. I do not want him having a lot of diarrhea. His Xeloda dose will be 1500 mg twice a day.   I also will have him on Erbitux. He is never has been on Erbitux. I really think that this combination should help him.   I went over the side effects with him. I told him about diarrhea. I told him that the skin rash with Erbitux. I gave him a recipe for the ingredients that we use to try to help with the skin rash.   I told as hair may come out. I told about his blood counts may be being little lower. He understands all of  this.   We will try to get treatment started next week.  Overall, his performance status is ECOG 1.  Medications: (   Medication List       This list is accurate as of: 08/08/15 12:35 PM.  Always use your most recent med list.               amLODipine-benazepril 5-20 MG capsule  Commonly known as:  LOTREL  Take 1 capsule by mouth daily.     capecitabine 500 MG tablet  Commonly known as:  XELODA  Take 3 tablets (1,500 mg total) by mouth 2 (two) times daily after a meal. Take for 14 days, off 7 days.     Coenzyme Q10 10 MG capsule  Take 10 mg by mouth daily.     diclofenac sodium 1 % Gel  Commonly known as:  VOLTAREN  Apply 2 g topically 4 (four) times daily.     doxycycline 100 MG tablet  Commonly known as:  VIBRA-TABS  Take 1 tablet (100 mg total) by mouth 2 (two) times daily.     gabapentin 100 MG capsule  Commonly known as:  NEURONTIN  Take 2 capsules (200 mg total) by mouth 3 (three) times daily. Pt okay to take only once a day     ibuprofen 200 MG tablet  Commonly  known as:  ADVIL,MOTRIN  Take 200-400 mg by mouth every 6 (six) hours as needed.     lidocaine-prilocaine cream  Commonly known as:  EMLA  Apply 1 application topically as needed. Apply quarter sized amount to portacath site 1-2 hours prior to chemotherapy appt.  Cover with saran wrap.     LORazepam 0.5 MG tablet  Commonly known as:  ATIVAN  Take 1 tablet (0.5 mg total) by mouth as needed (Nausea or vomiting).     oxyCODONE-acetaminophen 5-325 MG tablet  Commonly known as:  ROXICET  Take 1-2 tablets by mouth every 8 (eight) hours as needed for severe pain.     oxymetazoline 0.05 % nasal spray  Commonly known as:  AFRIN  Place 1 spray into both nostrils 2 (two) times daily as needed for congestion.     Pancrelipase (Lip-Prot-Amyl) 24000 units Cpep  Take 1 capsule (24,000 Units total) by mouth 3 (three) times daily before meals.        Allergies:  Allergies  Allergen Reactions  . Bactrim  [Sulfamethoxazole-Trimethoprim] Diarrhea    Has taken 2 different times and both times frequent diarrhea  . Lyrica [Pregabalin]     "thought I was going to die"  . Zofran [Ondansetron Hcl] Other (See Comments)    Hiccups    Past Medical History, Surgical history, Social history, and Family History were reviewed and updated.  Review of Systems: All other 10 point review of systems is negative.   Physical Exam:  height is _0  (1.651 m) and weight is 189 lb (85.73 kg). His oral temperature is 98 F (36.7 C). His blood pressure is 145/89 and his pulse is 76. His respiration is 16.   Wt Readings from Last 3 Encounters:  08/08/15 189 lb (85.73 kg)  07/21/15 190 lb (86.183 kg)  06/20/15 194 lb (87.998 kg)    Well-developed and well-nourished white male in no obvious distress. Head and neck exam shows no ocular or oral lesions. There are no palpable cervical or supraclavicular lymph nodes. Lungs are clear. Cardiac exam regular rate and rhythm with no murmurs, rubs or bruits. Abdomen is soft. There may be some slight distention. Bowel sounds are present. He has no guarding or rebound tenderness. No obvious abdominal masses noted. He has no hepatomegaly. Back exam shows no tenderness over the spine, ribs or hips. Externally shows no clubbing, cyanosis or edema. There may be some slight swelling in the left knee. He has good range of motion of the knee. Skin exam shows no rashes, ecchymosis or petechia. Neurological exam is nonfocal.   Lab Results  Component Value Date   WBC 5.4 08/08/2015   HGB 15.5 08/08/2015   HCT 44.2 08/08/2015   MCV 93 08/08/2015   PLT 145 08/08/2015   No results found for: FERRITIN, IRON, TIBC, UIBC, IRONPCTSAT Lab Results  Component Value Date   RBC 4.78 08/08/2015   No results found for: KPAFRELGTCHN, LAMBDASER, KAPLAMBRATIO No results found for: IGGSERUM, IGA, IGMSERUM No results found for: Odetta Pink,  SPEI   Chemistry      Component Value Date/Time   NA 139 08/08/2015 1103   NA 139 02/14/2015 0753   NA 137 01/26/2015 0815   K 3.9 08/08/2015 1103   K 4.4 02/14/2015 0753   K 4.1 01/26/2015 0815   CL 102 08/08/2015 1103   CL 104 01/26/2015 0815   CO2 30 08/08/2015 1103   CO2 24 02/14/2015 0753  CO2 26 01/26/2015 0815   BUN 16 08/08/2015 1103   BUN 18.1 02/14/2015 0753   BUN 14 01/26/2015 0815   CREATININE 0.7 08/08/2015 1103   CREATININE 0.9 02/14/2015 0753   CREATININE 0.82 01/26/2015 0815      Component Value Date/Time   CALCIUM 9.1 08/08/2015 1103   CALCIUM 9.6 02/14/2015 0753   CALCIUM 9.3 01/26/2015 0815   ALKPHOS 151* 08/08/2015 1103   ALKPHOS 115 02/14/2015 0753   ALKPHOS 98 01/26/2015 0815   AST 44* 08/08/2015 1103   AST 27 02/14/2015 0753   AST 31 01/26/2015 0815   ALT 38 08/08/2015 1103   ALT 28 02/14/2015 0753   ALT 24 01/26/2015 0815   BILITOT 1.10 08/08/2015 1103   BILITOT 0.73 02/14/2015 0753   BILITOT 1.0 01/26/2015 0815     Impression and Plan: Mr. Mizzell is 65 year old gentleman with metastatic colon cancer.   Overall, I think he's done incredibly well. We first started treating him back in December 2015.  He, in my mind, would still respond to treatment. He has never had irinotecan. He has never had Erbitux. Granted, side effects might be more prominent with these. However, he would suddenly be able to really handle the side effects area I talked about side effects area he understands these well.  I spent about 45 minutes with him.   Volanda Napoleon, MD 3/7/201712:35 PM

## 2015-08-08 NOTE — Progress Notes (Signed)
No treatment today per Dr Marin Olp. dph

## 2015-08-09 ENCOUNTER — Telehealth: Payer: Self-pay | Admitting: Hematology & Oncology

## 2015-08-09 NOTE — Telephone Encounter (Signed)
I spoke w Bronson Ing. @ [place] and she advised code below needs PA.  [place] [id number] - APPROVED Auth: [id number] Valid: 08/09/2015 - 08/08/2016 Erbitux [id number] - NPR Camptosar [id number] - NPR P: [id number]  Dx: C18.9 & C78.7  [employer] Com[employer] This patient does not require prior authorization through the [facility]. However, if you would like to request a Pre-Determination, click CONTINUE. Otherwise, click CANCEL to close this request and return to the home screen.  Pre-Determination info below: Your case reference number is [id number].  [employer] Clinical Coverage Review will be reviewing this request. You will be contacted if additional information is needed and we ask that you respond to any request as soon as possible to allow for a prompt coverage determination. You will be notified of the coverage determination by mail.  You may check the status of this request at anytime by selecting Existing Patient History and Authorization on this website or the [employer] Online portal.  Providers may request an urgent review of an injectable chemotherapy regimen if the member meets one of the following criteria:   Clinical symptoms that require emergent treatment within 48 hours (Clinic should list symptoms during the authorization process)  Member has an aggressive malignancy Contact  [phone] with any questions about this pended case or to request an urgent review. You will need to provide the case reference number and the patient name.         Provider Name:  Dr. Burney Gauze  Contact:  Baxter Flattery   Provider Address:  [address] [place], [place] [id number]  Phone Number:  [phone]      Fax Number:  [phone]     Patient Name:  Ryan Maynard  Patient Id:  [id number]   Insurance Carrier:  [employer]       Primary Diagnosis Code:  C18.9  Description:  Malignant neoplasm of colon, unspecified   Secondary Diagnosis Code:  C78.7   Description:  Secondary malignant neoplasm of liver and intrahepatic bile duct   Start Date:  08/15/2015  Request Type:  Change in Treatment   HCPCS code(s)  [id number], [id number], [id number], [id number]  Description:  Levoleucovorin [place]), 5-Fluorouracil (Adrucil, 5FU), Bevacizumab (Avastin), Leucovorin - inj   Case Number:  [id number]     Review Date:  08/09/2015 1:31:53 PM     Expiration Date:  N/A     Status:  Your case has been sent to Medical Review.

## 2015-08-15 ENCOUNTER — Other Ambulatory Visit (HOSPITAL_BASED_OUTPATIENT_CLINIC_OR_DEPARTMENT_OTHER): Payer: BLUE CROSS/BLUE SHIELD

## 2015-08-15 ENCOUNTER — Ambulatory Visit (HOSPITAL_BASED_OUTPATIENT_CLINIC_OR_DEPARTMENT_OTHER): Payer: BLUE CROSS/BLUE SHIELD

## 2015-08-15 ENCOUNTER — Other Ambulatory Visit: Payer: Self-pay | Admitting: Family

## 2015-08-15 VITALS — BP 146/80 | HR 68 | Temp 98.1°F | Resp 20 | Wt 189.2 lb

## 2015-08-15 DIAGNOSIS — K591 Functional diarrhea: Secondary | ICD-10-CM

## 2015-08-15 DIAGNOSIS — C787 Secondary malignant neoplasm of liver and intrahepatic bile duct: Secondary | ICD-10-CM | POA: Diagnosis not present

## 2015-08-15 DIAGNOSIS — Z5112 Encounter for antineoplastic immunotherapy: Secondary | ICD-10-CM | POA: Diagnosis not present

## 2015-08-15 DIAGNOSIS — R112 Nausea with vomiting, unspecified: Secondary | ICD-10-CM

## 2015-08-15 DIAGNOSIS — C189 Malignant neoplasm of colon, unspecified: Secondary | ICD-10-CM | POA: Diagnosis not present

## 2015-08-15 DIAGNOSIS — Z5111 Encounter for antineoplastic chemotherapy: Secondary | ICD-10-CM | POA: Diagnosis not present

## 2015-08-15 LAB — CMP (CANCER CENTER ONLY)
ALBUMIN: 3.4 g/dL (ref 3.3–5.5)
ALK PHOS: 159 U/L — AB (ref 26–84)
ALT(SGPT): 58 U/L — ABNORMAL HIGH (ref 10–47)
AST: 47 U/L — AB (ref 11–38)
BILIRUBIN TOTAL: 0.9 mg/dL (ref 0.20–1.60)
BUN, Bld: 15 mg/dL (ref 7–22)
CALCIUM: 9.2 mg/dL (ref 8.0–10.3)
CO2: 26 mEq/L (ref 18–33)
CREATININE: 0.9 mg/dL (ref 0.6–1.2)
Chloride: 104 mEq/L (ref 98–108)
Glucose, Bld: 107 mg/dL (ref 73–118)
Potassium: 3.7 mEq/L (ref 3.3–4.7)
SODIUM: 138 meq/L (ref 128–145)
TOTAL PROTEIN: 7.2 g/dL (ref 6.4–8.1)

## 2015-08-15 LAB — CBC WITH DIFFERENTIAL (CANCER CENTER ONLY)
BASO#: 0.1 10*3/uL (ref 0.0–0.2)
BASO%: 0.7 % (ref 0.0–2.0)
EOS ABS: 0.1 10*3/uL (ref 0.0–0.5)
EOS%: 1.6 % (ref 0.0–7.0)
HEMATOCRIT: 44.2 % (ref 38.7–49.9)
HEMOGLOBIN: 15.2 g/dL (ref 13.0–17.1)
LYMPH#: 1.3 10*3/uL (ref 0.9–3.3)
LYMPH%: 18.7 % (ref 14.0–48.0)
MCH: 31.8 pg (ref 28.0–33.4)
MCHC: 34.4 g/dL (ref 32.0–35.9)
MCV: 93 fL (ref 82–98)
MONO#: 0.7 10*3/uL (ref 0.1–0.9)
MONO%: 9.9 % (ref 0.0–13.0)
NEUT%: 69.1 % (ref 40.0–80.0)
NEUTROS ABS: 4.7 10*3/uL (ref 1.5–6.5)
Platelets: 130 10*3/uL — ABNORMAL LOW (ref 145–400)
RBC: 4.78 10*6/uL (ref 4.20–5.70)
RDW: 12.9 % (ref 11.1–15.7)
WBC: 6.8 10*3/uL (ref 4.0–10.0)

## 2015-08-15 MED ORDER — HEPARIN SOD (PORK) LOCK FLUSH 100 UNIT/ML IV SOLN
500.0000 [IU] | Freq: Once | INTRAVENOUS | Status: AC | PRN
  Administered 2015-08-15: 500 [IU]
  Filled 2015-08-15: qty 5

## 2015-08-15 MED ORDER — PALONOSETRON HCL INJECTION 0.25 MG/5ML
0.2500 mg | Freq: Once | INTRAVENOUS | Status: AC
  Administered 2015-08-15: 0.25 mg via INTRAVENOUS

## 2015-08-15 MED ORDER — LOPERAMIDE HCL 2 MG PO TABS
4.0000 mg | ORAL_TABLET | Freq: Once | ORAL | Status: AC
  Administered 2015-08-15: 4 mg via ORAL
  Filled 2015-08-15: qty 2

## 2015-08-15 MED ORDER — SODIUM CHLORIDE 0.9 % IV SOLN
10.0000 mg | Freq: Once | INTRAVENOUS | Status: AC
  Administered 2015-08-15: 10 mg via INTRAVENOUS
  Filled 2015-08-15: qty 1

## 2015-08-15 MED ORDER — ATROPINE SULFATE 1 MG/ML IJ SOLN
0.5000 mg | Freq: Once | INTRAMUSCULAR | Status: AC | PRN
  Administered 2015-08-15: 0.5 mg via INTRAVENOUS

## 2015-08-15 MED ORDER — SODIUM CHLORIDE 0.9% FLUSH
10.0000 mL | INTRAVENOUS | Status: DC | PRN
  Administered 2015-08-15: 10 mL
  Filled 2015-08-15: qty 10

## 2015-08-15 MED ORDER — SODIUM CHLORIDE 0.9 % IV SOLN
Freq: Once | INTRAVENOUS | Status: AC
  Administered 2015-08-15: 09:00:00 via INTRAVENOUS

## 2015-08-15 MED ORDER — PROCHLORPERAZINE MALEATE 10 MG PO TABS: 10.0000 mg | ORAL_TABLET | Freq: Four times a day (QID) | ORAL | Status: DC | PRN

## 2015-08-15 MED ORDER — ATROPINE SULFATE 1 MG/ML IJ SOLN
INTRAMUSCULAR | Status: AC
  Filled 2015-08-15: qty 1

## 2015-08-15 MED ORDER — LOPERAMIDE HCL 2 MG PO CAPS
ORAL_CAPSULE | ORAL | Status: AC
  Filled 2015-08-15: qty 2

## 2015-08-15 MED ORDER — PALONOSETRON HCL INJECTION 0.25 MG/5ML
INTRAVENOUS | Status: AC
  Filled 2015-08-15: qty 5

## 2015-08-15 MED ORDER — DIPHENHYDRAMINE HCL 50 MG/ML IJ SOLN
INTRAMUSCULAR | Status: AC
  Filled 2015-08-15: qty 1

## 2015-08-15 MED ORDER — DEXAMETHASONE 4 MG PO TABS: 8.0000 mg | ORAL_TABLET | Freq: Every day | ORAL | Status: DC

## 2015-08-15 MED ORDER — CETUXIMAB CHEMO IV INJECTION 200 MG/100ML
500.0000 mg/m2 | Freq: Once | INTRAVENOUS | Status: AC
  Administered 2015-08-15: 1000 mg via INTRAVENOUS
  Filled 2015-08-15: qty 500

## 2015-08-15 MED ORDER — LORAZEPAM 1 MG PO TABS: 1.0000 mg | ORAL_TABLET | Freq: Four times a day (QID) | ORAL | Status: DC | PRN

## 2015-08-15 MED ORDER — DEXTROSE 5 % IV SOLN
242.0000 mg/m2 | Freq: Once | INTRAVENOUS | Status: AC
  Administered 2015-08-15: 480 mg via INTRAVENOUS
  Filled 2015-08-15: qty 8

## 2015-08-15 MED ORDER — DIPHENHYDRAMINE HCL 50 MG/ML IJ SOLN
50.0000 mg | Freq: Once | INTRAMUSCULAR | Status: AC
  Administered 2015-08-15: 50 mg via INTRAVENOUS

## 2015-08-15 MED ORDER — LOPERAMIDE HCL 2 MG PO TABS: 4.0000 mg | ORAL_TABLET | Freq: Four times a day (QID) | ORAL | Status: DC | PRN

## 2015-08-15 NOTE — Progress Notes (Signed)
2:15 PM Complain of abd cramping, Sarah  Notified, orders received. Explained to wife and pt that he make repeat Immodium at @2pm  today if needed.

## 2015-08-15 NOTE — Patient Instructions (Signed)
[facility] Discharge Instructions for Patients Receiving Chemotherapy  Today you received the following chemotherapy agents:  Irinotecan and Erbitux.  To help prevent nausea and vomiting after your treatment, we encourage you to take your nausea medications as directed and written on the bottle. The prescriptions have been called into your pharmacy.   If you develop nausea and vomiting that is not controlled by your nausea medication, call the clinic.   BELOW ARE SYMPTOMS THAT SHOULD BE REPORTED IMMEDIATELY:  *FEVER GREATER THAN 100.5 F  *CHILLS WITH OR WITHOUT FEVER  NAUSEA AND VOMITING THAT IS NOT CONTROLLED WITH YOUR NAUSEA MEDICATION  *UNUSUAL SHORTNESS OF BREATH  *UNUSUAL BRUISING OR BLEEDING  TENDERNESS IN MOUTH AND THROAT WITH OR WITHOUT PRESENCE OF ULCERS  *URINARY PROBLEMS  *BOWEL PROBLEMS  UNUSUAL RASH Items with * indicate a potential emergency and should be followed up as soon as possible.  Feel free to call the clinic you have any questions or concerns. The clinic phone number is [phone].  Please show the [facility] at check-in to the Emergency Department and triage nurse.

## 2015-08-16 ENCOUNTER — Telehealth: Payer: Self-pay | Admitting: *Deleted

## 2015-08-16 ENCOUNTER — Encounter: Payer: Self-pay | Admitting: Hematology & Oncology

## 2015-08-16 NOTE — Telephone Encounter (Signed)
Called patient and he's doing well. He has had no issues with n/v or diarrhea. He does have hiccups, which have plagued him in the past with prior chemotherapy regimens. He is taking his decadron as scheduled. He has imodium at the home and understands to take this if he starts experiencing loose stools.  He will call the office if he has any questions or issues.

## 2015-08-28 ENCOUNTER — Other Ambulatory Visit: Payer: Self-pay | Admitting: *Deleted

## 2015-08-28 DIAGNOSIS — C189 Malignant neoplasm of colon, unspecified: Secondary | ICD-10-CM

## 2015-08-28 DIAGNOSIS — C787 Secondary malignant neoplasm of liver and intrahepatic bile duct: Principal | ICD-10-CM

## 2015-08-29 ENCOUNTER — Other Ambulatory Visit: Payer: BLUE CROSS/BLUE SHIELD

## 2015-08-29 ENCOUNTER — Ambulatory Visit: Payer: BLUE CROSS/BLUE SHIELD | Admitting: Hematology & Oncology

## 2015-08-29 ENCOUNTER — Encounter: Payer: Self-pay | Admitting: Hematology & Oncology

## 2015-08-29 ENCOUNTER — Other Ambulatory Visit (HOSPITAL_BASED_OUTPATIENT_CLINIC_OR_DEPARTMENT_OTHER): Payer: BLUE CROSS/BLUE SHIELD

## 2015-08-29 ENCOUNTER — Ambulatory Visit: Payer: BLUE CROSS/BLUE SHIELD

## 2015-08-29 ENCOUNTER — Ambulatory Visit (HOSPITAL_BASED_OUTPATIENT_CLINIC_OR_DEPARTMENT_OTHER): Payer: BLUE CROSS/BLUE SHIELD | Admitting: Hematology & Oncology

## 2015-08-29 VITALS — BP 131/84 | HR 72 | Temp 98.1°F | Resp 16 | Ht 65.0 in | Wt 186.0 lb

## 2015-08-29 DIAGNOSIS — C189 Malignant neoplasm of colon, unspecified: Secondary | ICD-10-CM

## 2015-08-29 DIAGNOSIS — C787 Secondary malignant neoplasm of liver and intrahepatic bile duct: Principal | ICD-10-CM

## 2015-08-29 LAB — COMPREHENSIVE METABOLIC PANEL
ALBUMIN: 3.7 g/dL (ref 3.5–5.0)
ALK PHOS: 134 U/L (ref 40–150)
ALT: 47 U/L (ref 0–55)
ANION GAP: 8 meq/L (ref 3–11)
AST: 36 U/L — ABNORMAL HIGH (ref 5–34)
BILIRUBIN TOTAL: 0.43 mg/dL (ref 0.20–1.20)
BUN: 13.2 mg/dL (ref 7.0–26.0)
CALCIUM: 9.1 mg/dL (ref 8.4–10.4)
CO2: 26 mEq/L (ref 22–29)
Chloride: 105 mEq/L (ref 98–109)
Creatinine: 0.8 mg/dL (ref 0.7–1.3)
GLUCOSE: 88 mg/dL (ref 70–140)
POTASSIUM: 4.2 meq/L (ref 3.5–5.1)
Sodium: 139 mEq/L (ref 136–145)
TOTAL PROTEIN: 7.2 g/dL (ref 6.4–8.3)

## 2015-08-29 LAB — CBC WITH DIFFERENTIAL (CANCER CENTER ONLY)
BASO#: 0.1 10*3/uL (ref 0.0–0.2)
BASO%: 1.2 % (ref 0.0–2.0)
EOS%: 2.3 % (ref 0.0–7.0)
Eosinophils Absolute: 0.1 10*3/uL (ref 0.0–0.5)
HEMATOCRIT: 43.1 % (ref 38.7–49.9)
HGB: 14.6 g/dL (ref 13.0–17.1)
LYMPH#: 1.3 10*3/uL (ref 0.9–3.3)
LYMPH%: 30 % (ref 14.0–48.0)
MCH: 31.1 pg (ref 28.0–33.4)
MCHC: 33.9 g/dL (ref 32.0–35.9)
MCV: 92 fL (ref 82–98)
MONO#: 0.5 10*3/uL (ref 0.1–0.9)
MONO%: 12.2 % (ref 0.0–13.0)
NEUT#: 2.3 10*3/uL (ref 1.5–6.5)
NEUT%: 54.3 % (ref 40.0–80.0)
Platelets: 157 10*3/uL (ref 145–400)
RBC: 4.7 10*6/uL (ref 4.20–5.70)
RDW: 13.1 % (ref 11.1–15.7)
WBC: 4.3 10*3/uL (ref 4.0–10.0)

## 2015-08-29 LAB — LACTATE DEHYDROGENASE: LDH: 165 U/L (ref 125–245)

## 2015-08-29 NOTE — Progress Notes (Signed)
Hematology and Oncology Follow Up Visit  Ryan Maynard [id number] July 23, 1950 65 y.o. 08/29/2015   Principle Diagnosis:  Metastatic colon cancer - K-RAS wild-type - progressive  Current Therapy:   XELOX/Avastin s/p cycle 5 XELIRI/Erbitux - s/p c#1 (28 day cycles) Status post RFA of liver metastases    Interim History:  [name] is here today for follow-up. He tolerated his first cycle treatment pretty well. He does have a little bit of a rash from the Erbitux. Is not too bad. He is being pretty diligent with using the lotions and doxycycline.  He's had no diarrhea. He's had no nausea or vomiting. He has had no mouth sores.  He has not noted any problem with increased knee pain. He says once he started treatment, his left knee pain improved.  He's had no fever. He's had no issues with obvious hair loss.  He tells that he is going to go onto Medicare in the fall. We will need to see how this will affect his Xeloda.   Overall, his performance status is ECOG 1.    Medications: (   Medication List       This list is accurate as of: 08/29/15 11:22 AM.  Always use your most recent med list.               amLODipine-benazepril 5-20 MG capsule  Commonly known as:  LOTREL  Take 1 capsule by mouth daily.     capecitabine 500 MG tablet  Commonly known as:  XELODA  Take 3 tablets (1,500 mg total) by mouth 2 (two) times daily after a meal. Take for 14 days, off 7 days.     Coenzyme Q10 10 MG capsule  Take 10 mg by mouth daily.     dexamethasone 4 MG tablet  Commonly known as:  DECADRON  Take 2 tablets (8 mg total) by mouth daily. Start the day after chemo for 2 days.     diclofenac sodium 1 % Gel  Commonly known as:  VOLTAREN  Apply 2 g topically 4 (four) times daily.     doxycycline 100 MG tablet  Commonly known as:  VIBRA-TABS  Take 1 tablet (100 mg total) by mouth 2 (two) times daily.     gabapentin 100 MG capsule  Commonly known as:  NEURONTIN  Take 2 capsules (200  mg total) by mouth 3 (three) times daily. Pt okay to take only once a day     ibuprofen 200 MG tablet  Commonly known as:  ADVIL,MOTRIN  Take 200-400 mg by mouth every 6 (six) hours as needed.     lidocaine-prilocaine cream  Commonly known as:  EMLA  Apply 1 application topically as needed. Apply quarter sized amount to portacath site 1-2 hours prior to chemotherapy appt.  Cover with saran wrap.     loperamide 2 MG tablet  Commonly known as:  IMODIUM A-D  Take 2 tablets (4 mg total) by mouth 4 (four) times daily as needed. Take 2 at diarrhea onset , then 1 every 2hr until 12hrs with no BM. May take 2 every 4hrs at night. If diarrhea recurs repeat.     LORazepam 1 MG tablet  Commonly known as:  ATIVAN  Take 1 tablet (1 mg total) by mouth every 6 (six) hours as needed (NAUSEA).     oxyCODONE-acetaminophen 5-325 MG tablet  Commonly known as:  ROXICET  Take 1-2 tablets by mouth every 8 (eight) hours as needed for severe pain.  oxymetazoline 0.05 % nasal spray  Commonly known as:  AFRIN  Place 1 spray into both nostrils 2 (two) times daily as needed for congestion.     Pancrelipase (Lip-Prot-Amyl) 24000 units Cpep  Take 1 capsule (24,000 Units total) by mouth 3 (three) times daily before meals.     prochlorperazine 10 MG tablet  Commonly known as:  COMPAZINE  Take 1 tablet (10 mg total) by mouth every 6 (six) hours as needed (NAUSEA).        Allergies:  Allergies  Allergen Reactions  . Bactrim [Sulfamethoxazole-Trimethoprim] Diarrhea    Has taken 2 different times and both times frequent diarrhea  . Lyrica [Pregabalin]     "thought I was going to die"  . Zofran [Ondansetron Hcl] Other (See Comments)    Hiccups    Past Medical History, Surgical history, Social history, and Family History were reviewed and updated.  Review of Systems: All other 10 point review of systems is negative.   Physical Exam:  height is 5' 5"  (1.651 m) and weight is 186 lb (84.369 kg). His  oral temperature is 98.1 F (36.7 C). His blood pressure is 131/84 and his pulse is 72. His respiration is 16.   Wt Readings from Last 3 Encounters:  08/29/15 186 lb (84.369 kg)  08/15/15 189 lb 4 oz (85.843 kg)  08/08/15 189 lb (85.73 kg)    Well-developed and well-nourished white male in no obvious distress. Head and neck exam shows no ocular or oral lesions. There are no palpable cervical or supraclavicular lymph nodes. Lungs are clear. Cardiac exam regular rate and rhythm with no murmurs, rubs or bruits. Abdomen is soft. There may be some slight distention. Bowel sounds are present. He has no guarding or rebound tenderness. No obvious abdominal masses noted. He has no hepatomegaly. Back exam shows no tenderness over the spine, ribs or hips. Externally shows no clubbing, cyanosis or edema. There may be some slight swelling in the left knee. He has good range of motion of the knee. Skin exam shows no rashes, ecchymosis or petechia. Neurological exam is nonfocal.   Lab Results  Component Value Date   WBC 4.3 08/29/2015   HGB 14.6 08/29/2015   HCT 43.1 08/29/2015   MCV 92 08/29/2015   PLT 157 08/29/2015   No results found for: FERRITIN, IRON, TIBC, UIBC, IRONPCTSAT Lab Results  Component Value Date   RBC 4.70 08/29/2015   No results found for: KPAFRELGTCHN, LAMBDASER, KAPLAMBRATIO No results found for: IGGSERUM, IGA, IGMSERUM No results found for: Odetta Pink, SPEI   Chemistry      Component Value Date/Time   NA 138 08/15/2015 0828   NA 139 02/14/2015 0753   NA 137 01/26/2015 0815   K 3.7 08/15/2015 0828   K 4.4 02/14/2015 0753   K 4.1 01/26/2015 0815   CL 104 08/15/2015 0828   CL 104 01/26/2015 0815   CO2 26 08/15/2015 0828   CO2 24 02/14/2015 0753   CO2 26 01/26/2015 0815   BUN 15 08/15/2015 0828   BUN 18.1 02/14/2015 0753   BUN 14 01/26/2015 0815   CREATININE 0.9 08/15/2015 0828   CREATININE 0.9 02/14/2015 0753    CREATININE 0.82 01/26/2015 0815      Component Value Date/Time   CALCIUM 9.2 08/15/2015 0828   CALCIUM 9.6 02/14/2015 0753   CALCIUM 9.3 01/26/2015 0815   ALKPHOS 159* 08/15/2015 0828   ALKPHOS 115 02/14/2015 0753   ALKPHOS 98  01/26/2015 0815   AST 47* 08/15/2015 0828   AST 27 02/14/2015 0753   AST 31 01/26/2015 0815   ALT 58* 08/15/2015 0828   ALT 28 02/14/2015 0753   ALT 24 01/26/2015 0815   BILITOT 0.90 08/15/2015 0828   BILITOT 0.73 02/14/2015 0753   BILITOT 1.0 01/26/2015 0815     Impression and Plan: Mr. Hingle is 65 year old gentleman with metastatic colon cancer.   Overall, I think he's done incredibly well. We first started treating him back in December 2015.  So far, he is on pretty well with the Xeloda and irinotecan. I did not see anything that looks suspicious.  We will continue to see him back every couple weeks for right now. He will come back in one week for his second cycle. We will have to see what his CEA level is.  I spent about 30 minutes with him.   Volanda Napoleon, MD 3/28/201711:22 AM

## 2015-09-04 LAB — CEA

## 2015-09-12 ENCOUNTER — Encounter: Payer: Self-pay | Admitting: Hematology & Oncology

## 2015-09-12 ENCOUNTER — Other Ambulatory Visit (HOSPITAL_BASED_OUTPATIENT_CLINIC_OR_DEPARTMENT_OTHER): Payer: BLUE CROSS/BLUE SHIELD

## 2015-09-12 ENCOUNTER — Other Ambulatory Visit: Payer: Self-pay | Admitting: Nurse Practitioner

## 2015-09-12 ENCOUNTER — Ambulatory Visit (HOSPITAL_BASED_OUTPATIENT_CLINIC_OR_DEPARTMENT_OTHER): Payer: BLUE CROSS/BLUE SHIELD | Admitting: Hematology & Oncology

## 2015-09-12 ENCOUNTER — Other Ambulatory Visit: Payer: Self-pay | Admitting: Family

## 2015-09-12 ENCOUNTER — Ambulatory Visit (HOSPITAL_BASED_OUTPATIENT_CLINIC_OR_DEPARTMENT_OTHER): Payer: BLUE CROSS/BLUE SHIELD

## 2015-09-12 VITALS — BP 152/91 | HR 72 | Temp 98.0°F | Resp 14 | Ht 65.0 in | Wt 189.0 lb

## 2015-09-12 DIAGNOSIS — C787 Secondary malignant neoplasm of liver and intrahepatic bile duct: Secondary | ICD-10-CM

## 2015-09-12 DIAGNOSIS — C189 Malignant neoplasm of colon, unspecified: Secondary | ICD-10-CM

## 2015-09-12 DIAGNOSIS — Z5112 Encounter for antineoplastic immunotherapy: Secondary | ICD-10-CM

## 2015-09-12 DIAGNOSIS — Z5111 Encounter for antineoplastic chemotherapy: Secondary | ICD-10-CM | POA: Diagnosis not present

## 2015-09-12 LAB — CBC WITH DIFFERENTIAL (CANCER CENTER ONLY)
BASO#: 0.1 10*3/uL (ref 0.0–0.2)
BASO%: 1.7 % (ref 0.0–2.0)
EOS%: 1.7 % (ref 0.0–7.0)
Eosinophils Absolute: 0.1 10*3/uL (ref 0.0–0.5)
HEMATOCRIT: 45.3 % (ref 38.7–49.9)
HEMOGLOBIN: 15.9 g/dL (ref 13.0–17.1)
LYMPH#: 1.2 10*3/uL (ref 0.9–3.3)
LYMPH%: 27.8 % (ref 14.0–48.0)
MCH: 32.3 pg (ref 28.0–33.4)
MCHC: 35.1 g/dL (ref 32.0–35.9)
MCV: 92 fL (ref 82–98)
MONO#: 0.7 10*3/uL (ref 0.1–0.9)
MONO%: 16.7 % — ABNORMAL HIGH (ref 0.0–13.0)
NEUT#: 2.2 10*3/uL (ref 1.5–6.5)
NEUT%: 52.1 % (ref 40.0–80.0)
Platelets: 144 10*3/uL — ABNORMAL LOW (ref 145–400)
RBC: 4.92 10*6/uL (ref 4.20–5.70)
RDW: 14.5 % (ref 11.1–15.7)
WBC: 4.1 10*3/uL (ref 4.0–10.0)

## 2015-09-12 LAB — CMP (CANCER CENTER ONLY)
ALBUMIN: 3.6 g/dL (ref 3.3–5.5)
ALK PHOS: 112 U/L — AB (ref 26–84)
ALT: 44 U/L (ref 10–47)
AST: 40 U/L — ABNORMAL HIGH (ref 11–38)
BILIRUBIN TOTAL: 1 mg/dL (ref 0.20–1.60)
BUN, Bld: 13 mg/dL (ref 7–22)
CALCIUM: 9.5 mg/dL (ref 8.0–10.3)
CO2: 28 mEq/L (ref 18–33)
Chloride: 98 mEq/L (ref 98–108)
Creat: 0.7 mg/dl (ref 0.6–1.2)
GLUCOSE: 92 mg/dL (ref 73–118)
POTASSIUM: 4.1 meq/L (ref 3.3–4.7)
Sodium: 136 mEq/L (ref 128–145)
Total Protein: 7 g/dL (ref 6.4–8.1)

## 2015-09-12 MED ORDER — DIPHENHYDRAMINE HCL 50 MG/ML IJ SOLN
INTRAMUSCULAR | Status: AC
  Filled 2015-09-12: qty 1

## 2015-09-12 MED ORDER — SODIUM CHLORIDE 0.9 % IV SOLN
Freq: Once | INTRAVENOUS | Status: AC
  Administered 2015-09-12: 11:00:00 via INTRAVENOUS

## 2015-09-12 MED ORDER — DIPHENHYDRAMINE HCL 50 MG/ML IJ SOLN
50.0000 mg | Freq: Once | INTRAMUSCULAR | Status: AC
  Administered 2015-09-12: 50 mg via INTRAVENOUS

## 2015-09-12 MED ORDER — SODIUM CHLORIDE 0.9% FLUSH
10.0000 mL | INTRAVENOUS | Status: DC | PRN
  Administered 2015-09-12: 10 mL
  Filled 2015-09-12: qty 10

## 2015-09-12 MED ORDER — ATROPINE SULFATE 1 MG/ML IJ SOLN
0.5000 mg | Freq: Once | INTRAMUSCULAR | Status: AC | PRN
Start: 2015-09-12 — End: 2015-09-12
  Administered 2015-09-12: 0.5 mg via INTRAVENOUS

## 2015-09-12 MED ORDER — BACLOFEN 10 MG PO TABS: ORAL_TABLET | ORAL | Status: DC

## 2015-09-12 MED ORDER — ATROPINE SULFATE 1 MG/ML IJ SOLN
INTRAMUSCULAR | Status: AC
  Filled 2015-09-12: qty 1

## 2015-09-12 MED ORDER — PALONOSETRON HCL INJECTION 0.25 MG/5ML
INTRAVENOUS | Status: AC
  Filled 2015-09-12: qty 5

## 2015-09-12 MED ORDER — SODIUM CHLORIDE 0.9 % IV SOLN
10.0000 mg | Freq: Once | INTRAVENOUS | Status: AC
  Administered 2015-09-12: 10 mg via INTRAVENOUS
  Filled 2015-09-12: qty 1

## 2015-09-12 MED ORDER — PALONOSETRON HCL INJECTION 0.25 MG/5ML
0.2500 mg | Freq: Once | INTRAVENOUS | Status: AC
  Administered 2015-09-12: 0.25 mg via INTRAVENOUS

## 2015-09-12 MED ORDER — IRINOTECAN HCL CHEMO INJECTION 100 MG/5ML
242.0000 mg/m2 | Freq: Once | INTRAVENOUS | Status: AC
  Administered 2015-09-12: 480 mg via INTRAVENOUS
  Filled 2015-09-12: qty 8

## 2015-09-12 MED ORDER — HEPARIN SOD (PORK) LOCK FLUSH 100 UNIT/ML IV SOLN
500.0000 [IU] | Freq: Once | INTRAVENOUS | Status: AC | PRN
  Administered 2015-09-12: 500 [IU]
  Filled 2015-09-12: qty 5

## 2015-09-12 MED ORDER — CETUXIMAB CHEMO IV INJECTION 200 MG/100ML
500.0000 mg/m2 | Freq: Once | INTRAVENOUS | Status: AC
  Administered 2015-09-12: 1000 mg via INTRAVENOUS
  Filled 2015-09-12: qty 500

## 2015-09-12 NOTE — Progress Notes (Signed)
Hematology and Oncology Follow Up Visit  Ryan Maynard [id number] [birth year] 65 y.o. 09/12/2015   Principle Diagnosis:  Metastatic colon cancer - K-RAS wild-type - progressive  Current Therapy:   XELOX/Avastin s/p cycle 5 XELIRI/Erbitux - s/p c#1 (28 day cycles) Status post RFA of liver metastases    Interim History:  Ryan Maynard is here today for follow-up. He sees been doing pretty well. He has had some issues with his skin from the sun. I told make sure he wears sunscreen.  He is not sleeping all that well. He does not wish to take any kind of sleep aid for right now.   He's having no problems with pain. He's had some left knee issues. He says this could better with treatment.   He's had no nausea or vomiting. He's had no diarrhea. He's had no bleeding. He's had no cough.   His last CEA level was 64. This is stable.   Overall, his performance status is ECOG 1.    Medications: (   Medication List       This list is accurate as of: 09/12/15 10:39 AM.  Always use your most recent med list.               amLODipine-benazepril 5-20 MG capsule  Commonly known as:  LOTREL  Take 1 capsule by mouth daily.     capecitabine 500 MG tablet  Commonly known as:  XELODA  Take 3 tablets (1,500 mg total) by mouth 2 (two) times daily after a meal. Take for 14 days, off 7 days.     Coenzyme Q10 10 MG capsule  Take 10 mg by mouth daily.     dexamethasone 4 MG tablet  Commonly known as:  DECADRON  Take 2 tablets (8 mg total) by mouth daily. Start the day after chemo for 2 days.     diclofenac sodium 1 % Gel  Commonly known as:  VOLTAREN  Apply 2 g topically 4 (four) times daily.     doxycycline 100 MG tablet  Commonly known as:  VIBRA-TABS  Take 1 tablet (100 mg total) by mouth 2 (two) times daily.     gabapentin 100 MG capsule  Commonly known as:  NEURONTIN  Take 2 capsules (200 mg total) by mouth 3 (three) times daily. Pt okay to take only once a day     ibuprofen 200 MG  tablet  Commonly known as:  ADVIL,MOTRIN  Take 200-400 mg by mouth every 6 (six) hours as needed.     lidocaine-prilocaine cream  Commonly known as:  EMLA  Apply 1 application topically as needed. Apply quarter sized amount to portacath site 1-2 hours prior to chemotherapy appt.  Cover with saran wrap.     loperamide 2 MG tablet  Commonly known as:  IMODIUM A-D  Take 2 tablets (4 mg total) by mouth 4 (four) times daily as needed. Take 2 at diarrhea onset , then 1 every 2hr until 12hrs with no BM. May take 2 every 4hrs at night. If diarrhea recurs repeat.     LORazepam 1 MG tablet  Commonly known as:  ATIVAN  Take 1 tablet (1 mg total) by mouth every 6 (six) hours as needed (NAUSEA).     oxyCODONE-acetaminophen 5-325 MG tablet  Commonly known as:  ROXICET  Take 1-2 tablets by mouth every 8 (eight) hours as needed for severe pain.     oxymetazoline 0.05 % nasal spray  Commonly known as:  AFRIN  Place 1  spray into both nostrils 2 (two) times daily as needed for congestion.     Pancrelipase (Lip-Prot-Amyl) 24000 units Cpep  Take 1 capsule (24,000 Units total) by mouth 3 (three) times daily before meals.     prochlorperazine 10 MG tablet  Commonly known as:  COMPAZINE  Take 1 tablet (10 mg total) by mouth every 6 (six) hours as needed (NAUSEA).        Allergies:  Allergies  Allergen Reactions  . Bactrim [Sulfamethoxazole-Trimethoprim] Diarrhea    Has taken 2 different times and both times frequent diarrhea  . Lyrica [Pregabalin]     "thought I was going to die"  . Zofran [Ondansetron Hcl] Other (See Comments)    Hiccups    Past Medical History, Surgical history, Social history, and Family History were reviewed and updated.  Review of Systems: All other 10 point review of systems is negative.   Physical Exam:  height is 5' 5"  (1.651 m) and weight is 189 lb (85.73 kg). His oral temperature is 98 F (36.7 C). His blood pressure is 152/91 and his pulse is 72. His  respiration is 14.   Wt Readings from Last 3 Encounters:  09/12/15 189 lb (85.73 kg)  08/29/15 186 lb (84.369 kg)  08/15/15 189 lb 4 oz (85.843 kg)    Well-developed and well-nourished white male in no obvious distress. Head and neck exam shows no ocular or oral lesions. There are no palpable cervical or supraclavicular lymph nodes. Lungs are clear. Cardiac exam regular rate and rhythm with no murmurs, rubs or bruits. Abdomen is soft. There may be some slight distention. Bowel sounds are present. He has no guarding or rebound tenderness. No obvious abdominal masses noted. He has no hepatomegaly. Back exam shows no tenderness over the spine, ribs or hips. Externally shows no clubbing, cyanosis or edema. There may be some slight swelling in the left knee. He has good range of motion of the knee. Skin exam shows no rashes, ecchymosis or petechia. Neurological exam is nonfocal.   Lab Results  Component Value Date   WBC 4.1 09/12/2015   HGB 15.9 09/12/2015   HCT 45.3 09/12/2015   MCV 92 09/12/2015   PLT 144* 09/12/2015   No results found for: FERRITIN, IRON, TIBC, UIBC, IRONPCTSAT Lab Results  Component Value Date   RBC 4.92 09/12/2015   No results found for: KPAFRELGTCHN, LAMBDASER, KAPLAMBRATIO No results found for: Kandis Cocking, IGMSERUM No results found for: Odetta Pink, SPEI   Chemistry      Component Value Date/Time   NA 136 09/12/2015 0953   NA 139 08/29/2015 0955   NA 137 01/26/2015 0815   K 4.1 09/12/2015 0953   K 4.2 08/29/2015 0955   K 4.1 01/26/2015 0815   CL 98 09/12/2015 0953   CL 104 01/26/2015 0815   CO2 28 09/12/2015 0953   CO2 26 08/29/2015 0955   CO2 26 01/26/2015 0815   BUN 13 09/12/2015 0953   BUN 13.2 08/29/2015 0955   BUN 14 01/26/2015 0815   CREATININE 0.7 09/12/2015 0953   CREATININE 0.8 08/29/2015 0955   CREATININE 0.82 01/26/2015 0815      Component Value Date/Time   CALCIUM 9.5 09/12/2015  0953   CALCIUM 9.1 08/29/2015 0955   CALCIUM 9.3 01/26/2015 0815   ALKPHOS 112* 09/12/2015 0953   ALKPHOS 134 08/29/2015 0955   ALKPHOS 98 01/26/2015 0815   AST 40* 09/12/2015 0953   AST 36* 08/29/2015 0955  AST 31 01/26/2015 0815   ALT 44 09/12/2015 0953   ALT 47 08/29/2015 0955   ALT 24 01/26/2015 0815   BILITOT 1.00 09/12/2015 0953   BILITOT 0.43 08/29/2015 0955   BILITOT 1.0 01/26/2015 0815     Impression and Plan: Mr. Victorian is 65 year old gentleman with metastatic colon cancer.   We will go ahead and give him his second cycle of treatment today .so far, he's tolerated this pretty well.   We will treat him with 3 cycles and then repeat his scans.   I will plan see him back in one month. He wants his treatments to be separated by one month.    Volanda Napoleon, MD 4/11/201710:39 AM

## 2015-09-12 NOTE — Patient Instructions (Signed)
[facility] Discharge Instructions for Patients Receiving Chemotherapy  Today you received the following chemotherapy agents Erbitux and Camptosar.  To help prevent nausea and vomiting after your treatment, we encourage you to take your nausea medication.   If you develop nausea and vomiting that is not controlled by your nausea medication, call the clinic.   BELOW ARE SYMPTOMS THAT SHOULD BE REPORTED IMMEDIATELY:  *FEVER GREATER THAN 100.5 F  *CHILLS WITH OR WITHOUT FEVER  NAUSEA AND VOMITING THAT IS NOT CONTROLLED WITH YOUR NAUSEA MEDICATION  *UNUSUAL SHORTNESS OF BREATH  *UNUSUAL BRUISING OR BLEEDING  TENDERNESS IN MOUTH AND THROAT WITH OR WITHOUT PRESENCE OF ULCERS  *URINARY PROBLEMS  *BOWEL PROBLEMS  UNUSUAL RASH Items with * indicate a potential emergency and should be followed up as soon as possible.  Feel free to call the clinic you have any questions or concerns. The clinic phone number is [phone].  Please show the [facility] at check-in to the Emergency Department and triage nurse.

## 2015-09-12 NOTE — Addendum Note (Signed)
Addended by: Burney Gauze R on: 09/12/2015 01:10 PM   Modules accepted: Orders

## 2015-09-13 LAB — CEA: CEA: 45.9 ng/mL — ABNORMAL HIGH (ref 0.0–4.7)

## 2015-09-28 ENCOUNTER — Other Ambulatory Visit: Payer: Self-pay | Admitting: *Deleted

## 2015-09-28 DIAGNOSIS — R112 Nausea with vomiting, unspecified: Secondary | ICD-10-CM

## 2015-09-28 DIAGNOSIS — C189 Malignant neoplasm of colon, unspecified: Secondary | ICD-10-CM

## 2015-09-28 DIAGNOSIS — C787 Secondary malignant neoplasm of liver and intrahepatic bile duct: Principal | ICD-10-CM

## 2015-09-28 MED ORDER — OXYCODONE-ACETAMINOPHEN 5-325 MG PO TABS: 1.0000 | ORAL_TABLET | Freq: Three times a day (TID) | ORAL | Status: DC | PRN

## 2015-09-29 ENCOUNTER — Other Ambulatory Visit: Payer: BLUE CROSS/BLUE SHIELD

## 2015-09-29 ENCOUNTER — Ambulatory Visit: Payer: BLUE CROSS/BLUE SHIELD | Admitting: Hematology & Oncology

## 2015-10-10 ENCOUNTER — Encounter: Payer: Self-pay | Admitting: Hematology & Oncology

## 2015-10-10 ENCOUNTER — Other Ambulatory Visit (HOSPITAL_BASED_OUTPATIENT_CLINIC_OR_DEPARTMENT_OTHER): Payer: BLUE CROSS/BLUE SHIELD

## 2015-10-10 ENCOUNTER — Ambulatory Visit (HOSPITAL_BASED_OUTPATIENT_CLINIC_OR_DEPARTMENT_OTHER): Payer: BLUE CROSS/BLUE SHIELD

## 2015-10-10 ENCOUNTER — Ambulatory Visit (HOSPITAL_BASED_OUTPATIENT_CLINIC_OR_DEPARTMENT_OTHER): Payer: BLUE CROSS/BLUE SHIELD | Admitting: Hematology & Oncology

## 2015-10-10 VITALS — BP 146/87 | HR 69 | Temp 98.2°F | Resp 16 | Ht 65.0 in | Wt 191.0 lb

## 2015-10-10 DIAGNOSIS — Z5111 Encounter for antineoplastic chemotherapy: Secondary | ICD-10-CM | POA: Diagnosis not present

## 2015-10-10 DIAGNOSIS — C787 Secondary malignant neoplasm of liver and intrahepatic bile duct: Principal | ICD-10-CM

## 2015-10-10 DIAGNOSIS — C189 Malignant neoplasm of colon, unspecified: Secondary | ICD-10-CM

## 2015-10-10 LAB — CMP (CANCER CENTER ONLY)
ALBUMIN: 3.6 g/dL (ref 3.3–5.5)
ALT(SGPT): 43 U/L (ref 10–47)
AST: 42 U/L — ABNORMAL HIGH (ref 11–38)
Alkaline Phosphatase: 99 U/L — ABNORMAL HIGH (ref 26–84)
BUN, Bld: 17 mg/dL (ref 7–22)
CHLORIDE: 103 meq/L (ref 98–108)
CO2: 29 mEq/L (ref 18–33)
Calcium: 9.3 mg/dL (ref 8.0–10.3)
Creat: 0.8 mg/dl (ref 0.6–1.2)
Glucose, Bld: 89 mg/dL (ref 73–118)
POTASSIUM: 4 meq/L (ref 3.3–4.7)
Sodium: 139 mEq/L (ref 128–145)
TOTAL PROTEIN: 6.7 g/dL (ref 6.4–8.1)
Total Bilirubin: 1 mg/dl (ref 0.20–1.60)

## 2015-10-10 LAB — CBC WITH DIFFERENTIAL (CANCER CENTER ONLY)
BASO#: 0.1 10*3/uL (ref 0.0–0.2)
BASO%: 2 % (ref 0.0–2.0)
EOS ABS: 0.1 10*3/uL (ref 0.0–0.5)
EOS%: 1.8 % (ref 0.0–7.0)
HCT: 44.7 % (ref 38.7–49.9)
HEMOGLOBIN: 15.4 g/dL (ref 13.0–17.1)
LYMPH#: 1.4 10*3/uL (ref 0.9–3.3)
LYMPH%: 25.5 % (ref 14.0–48.0)
MCH: 32 pg (ref 28.0–33.4)
MCHC: 34.5 g/dL (ref 32.0–35.9)
MCV: 93 fL (ref 82–98)
MONO#: 0.8 10*3/uL (ref 0.1–0.9)
MONO%: 13.7 % — ABNORMAL HIGH (ref 0.0–13.0)
NEUT%: 57 % (ref 40.0–80.0)
NEUTROS ABS: 3.2 10*3/uL (ref 1.5–6.5)
Platelets: 148 10*3/uL (ref 145–400)
RBC: 4.82 10*6/uL (ref 4.20–5.70)
RDW: 15.6 % (ref 11.1–15.7)
WBC: 5.6 10*3/uL (ref 4.0–10.0)

## 2015-10-10 MED ORDER — EPINEPHRINE HCL 1 MG/ML IJ SOLN: 0.5000 mg | Freq: Once | INTRAMUSCULAR | Status: DC | PRN

## 2015-10-10 MED ORDER — CETUXIMAB CHEMO IV INJECTION 200 MG/100ML
500.0000 mg/m2 | Freq: Once | INTRAVENOUS | Status: AC
  Administered 2015-10-10: 1000 mg via INTRAVENOUS
  Filled 2015-10-10: qty 500

## 2015-10-10 MED ORDER — DIPHENHYDRAMINE HCL 50 MG/ML IJ SOLN
INTRAMUSCULAR | Status: AC
  Filled 2015-10-10: qty 1

## 2015-10-10 MED ORDER — DIPHENHYDRAMINE HCL 50 MG/ML IJ SOLN: 50.0000 mg | Freq: Once | INTRAMUSCULAR | Status: DC | PRN

## 2015-10-10 MED ORDER — HEPARIN SOD (PORK) LOCK FLUSH 100 UNIT/ML IV SOLN
500.0000 [IU] | Freq: Once | INTRAVENOUS | Status: AC | PRN
  Administered 2015-10-10: 500 [IU]
  Filled 2015-10-10: qty 5

## 2015-10-10 MED ORDER — SODIUM CHLORIDE 0.9% FLUSH
3.0000 mL | INTRAVENOUS | Status: DC | PRN
  Filled 2015-10-10: qty 10

## 2015-10-10 MED ORDER — HEPARIN SOD (PORK) LOCK FLUSH 100 UNIT/ML IV SOLN
500.0000 [IU] | Freq: Once | INTRAVENOUS | Status: DC | PRN
  Filled 2015-10-10: qty 5

## 2015-10-10 MED ORDER — ATROPINE SULFATE 1 MG/ML IJ SOLN
0.5000 mg | Freq: Once | INTRAMUSCULAR | Status: AC | PRN
  Administered 2015-10-10: 0.5 mg via INTRAVENOUS

## 2015-10-10 MED ORDER — METHYLPREDNISOLONE SODIUM SUCC 125 MG IJ SOLR: 125.0000 mg | Freq: Once | INTRAMUSCULAR | Status: DC | PRN

## 2015-10-10 MED ORDER — ATROPINE SULFATE 1 MG/ML IJ SOLN
INTRAMUSCULAR | Status: AC
  Filled 2015-10-10: qty 1

## 2015-10-10 MED ORDER — EPINEPHRINE HCL 0.1 MG/ML IJ SOSY
0.2500 mg | PREFILLED_SYRINGE | Freq: Once | INTRAMUSCULAR | Status: DC | PRN
  Filled 2015-10-10: qty 10

## 2015-10-10 MED ORDER — ALBUTEROL SULFATE (2.5 MG/3ML) 0.083% IN NEBU
2.5000 mg | INHALATION_SOLUTION | Freq: Once | RESPIRATORY_TRACT | Status: DC | PRN
  Filled 2015-10-10: qty 3

## 2015-10-10 MED ORDER — DIPHENHYDRAMINE HCL 50 MG/ML IJ SOLN
50.0000 mg | Freq: Once | INTRAMUSCULAR | Status: AC
Start: 2015-10-10 — End: 2015-10-10
  Administered 2015-10-10: 50 mg via INTRAVENOUS

## 2015-10-10 MED ORDER — HEPARIN SOD (PORK) LOCK FLUSH 100 UNIT/ML IV SOLN
250.0000 [IU] | Freq: Once | INTRAVENOUS | Status: DC | PRN
  Filled 2015-10-10: qty 5

## 2015-10-10 MED ORDER — SODIUM CHLORIDE 0.9% FLUSH
10.0000 mL | INTRAVENOUS | Status: DC | PRN
  Administered 2015-10-10: 10 mL
  Filled 2015-10-10: qty 10

## 2015-10-10 MED ORDER — DEXTROSE 5 % IV SOLN
242.0000 mg/m2 | Freq: Once | INTRAVENOUS | Status: AC
  Administered 2015-10-10: 480 mg via INTRAVENOUS
  Filled 2015-10-10: qty 8

## 2015-10-10 MED ORDER — SODIUM CHLORIDE 0.9 % IV SOLN
Freq: Once | INTRAVENOUS | Status: AC
  Administered 2015-10-10: 12:00:00 via INTRAVENOUS

## 2015-10-10 MED ORDER — SODIUM CHLORIDE 0.9 % IV SOLN: Freq: Once | INTRAVENOUS | Status: DC | PRN

## 2015-10-10 MED ORDER — DIPHENHYDRAMINE HCL 50 MG/ML IJ SOLN: 25.0000 mg | Freq: Once | INTRAMUSCULAR | Status: DC | PRN

## 2015-10-10 MED ORDER — FAMOTIDINE IN NACL 20-0.9 MG/50ML-% IV SOLN: 20.0000 mg | Freq: Once | INTRAVENOUS | Status: DC | PRN

## 2015-10-10 MED ORDER — ALTEPLASE 2 MG IJ SOLR
2.0000 mg | Freq: Once | INTRAMUSCULAR | Status: DC | PRN
  Filled 2015-10-10: qty 2

## 2015-10-10 MED ORDER — PALONOSETRON HCL INJECTION 0.25 MG/5ML
INTRAVENOUS | Status: AC
  Filled 2015-10-10: qty 5

## 2015-10-10 MED ORDER — PALONOSETRON HCL INJECTION 0.25 MG/5ML
0.2500 mg | Freq: Once | INTRAVENOUS | Status: AC
  Administered 2015-10-10: 0.25 mg via INTRAVENOUS

## 2015-10-10 MED ORDER — SODIUM CHLORIDE 0.9% FLUSH
10.0000 mL | INTRAVENOUS | Status: DC | PRN
  Filled 2015-10-10: qty 10

## 2015-10-10 MED ORDER — SODIUM CHLORIDE 0.9 % IV SOLN: Freq: Once | INTRAVENOUS | Status: DC

## 2015-10-10 NOTE — Patient Instructions (Signed)
[facility] Discharge Instructions for Patients Receiving Chemotherapy  Today you received the following chemotherapy agents Erbitux, Irinotecan  To help prevent nausea and vomiting after your treatment, we encourage you to take your nausea medication    If you develop nausea and vomiting that is not controlled by your nausea medication, call the clinic.   BELOW ARE SYMPTOMS THAT SHOULD BE REPORTED IMMEDIATELY:  *FEVER GREATER THAN 100.5 F  *CHILLS WITH OR WITHOUT FEVER  NAUSEA AND VOMITING THAT IS NOT CONTROLLED WITH YOUR NAUSEA MEDICATION  *UNUSUAL SHORTNESS OF BREATH  *UNUSUAL BRUISING OR BLEEDING  TENDERNESS IN MOUTH AND THROAT WITH OR WITHOUT PRESENCE OF ULCERS  *URINARY PROBLEMS  *BOWEL PROBLEMS  UNUSUAL RASH Items with * indicate a potential emergency and should be followed up as soon as possible.  Feel free to call the clinic you have any questions or concerns. The clinic phone number is [phone].  Please show the [facility] at check-in to the Emergency Department and triage nurse.

## 2015-10-10 NOTE — Progress Notes (Signed)
Hematology and Oncology Follow Up Visit  Ryan Maynard [id number] [birth year] 65 y.o. 10/10/2015   Principle Diagnosis:  Metastatic colon cancer - K-RAS wild-type - progressive  Current Therapy:   XELOX/Avastin s/p cycle 5 XELIRI/Erbitux - s/p c#2 (28 day cycles) Status post RFA of liver metastases    Interim History:  Ryan Maynard is here today for follow-up. He sees been doing pretty well. He is still tolerating the chemotherapy pretty well. He says that he occasionally does not take the Xeloda twice a day. I told him that if he does forget to take Xeloda that he does not have to make it up.  He is being very careful with his skin. He is being very diligent with using the skin protocol to help prevent with reactions from the Erbitux.  He does not sleep well for the first couple days of his treatment. He has a lot of "energy". I did give him a couple samples of Valium. Hope this will help him rest. I will also hold on the Decadron in the premeds. He is not having any nausea. Hopefully, stopping the Decadron will help with some of the side effects that he is having.  His CEA is coming down slowly. His last CEA was 46.  There's not been any nausea or vomiting. He's had no diarrhea. His appetite has been good. He's had no mouth sores.  Overall, his performance status is ECOG 1.    Medications: (   Medication List       This list is accurate as of: 10/10/15  1:26 PM.  Always use your most recent med list.               amLODipine-benazepril 5-20 MG capsule  Commonly known as:  LOTREL  Take 1 capsule by mouth daily.     baclofen 10 MG tablet  Commonly known as:  LIORESAL  Take 1 pill if needed every 8 hrs for hiccups     capecitabine 500 MG tablet  Commonly known as:  XELODA  Take 3 tablets (1,500 mg total) by mouth 2 (two) times daily after a meal. Take for 14 days, off 7 days.     Coenzyme Q10 10 MG capsule  Take 10 mg by mouth daily.     dexamethasone 4 MG tablet    Commonly known as:  DECADRON  Take 2 tablets (8 mg total) by mouth daily. Start the day after chemo for 2 days.     diclofenac sodium 1 % Gel  Commonly known as:  VOLTAREN  Apply 2 g topically 4 (four) times daily.     doxycycline 100 MG tablet  Commonly known as:  VIBRA-TABS  Take 1 tablet (100 mg total) by mouth 2 (two) times daily.     gabapentin 100 MG capsule  Commonly known as:  NEURONTIN  Take 2 capsules (200 mg total) by mouth 3 (three) times daily. Pt okay to take only once a day     ibuprofen 200 MG tablet  Commonly known as:  ADVIL,MOTRIN  Take 200-400 mg by mouth every 6 (six) hours as needed.     lidocaine-prilocaine cream  Commonly known as:  EMLA  Apply 1 application topically as needed. Apply quarter sized amount to portacath site 1-2 hours prior to chemotherapy appt.  Cover with saran wrap.     loperamide 2 MG tablet  Commonly known as:  IMODIUM A-D  Take 2 tablets (4 mg total) by mouth 4 (four) times daily as needed.  Take 2 at diarrhea onset , then 1 every 2hr until 12hrs with no BM. May take 2 every 4hrs at night. If diarrhea recurs repeat.     LORazepam 1 MG tablet  Commonly known as:  ATIVAN  Take 1 tablet (1 mg total) by mouth every 6 (six) hours as needed (NAUSEA).     oxyCODONE-acetaminophen 5-325 MG tablet  Commonly known as:  ROXICET  Take 1-2 tablets by mouth every 8 (eight) hours as needed for severe pain.     oxymetazoline 0.05 % nasal spray  Commonly known as:  AFRIN  Place 1 spray into both nostrils 2 (two) times daily as needed for congestion.     Pancrelipase (Lip-Prot-Amyl) 24000 units Cpep  Take 1 capsule (24,000 Units total) by mouth 3 (three) times daily before meals.     prochlorperazine 10 MG tablet  Commonly known as:  COMPAZINE  Take 1 tablet (10 mg total) by mouth every 6 (six) hours as needed (NAUSEA).        Allergies:  Allergies  Allergen Reactions  . Bactrim [Sulfamethoxazole-Trimethoprim] Diarrhea    Has taken 2  different times and both times frequent diarrhea  . Lyrica [Pregabalin]     "thought I was going to die"  . Zofran [Ondansetron Hcl] Other (See Comments)    Hiccups    Past Medical History, Surgical history, Social history, and Family History were reviewed and updated.  Review of Systems: All other 10 point review of systems is negative.   Physical Exam:  height is _0  (1.651 m) and weight is 191 lb (86.637 kg). His oral temperature is 98.2 F (36.8 C). His blood pressure is 146/87 and his pulse is 69. His respiration is 16.   Wt Readings from Last 3 Encounters:  10/10/15 191 lb (86.637 kg)  09/12/15 189 lb (85.73 kg)  08/29/15 186 lb (84.369 kg)    Well-developed and well-nourished white male in no obvious distress. Head and neck exam shows no ocular or oral lesions. There are no palpable cervical or supraclavicular lymph nodes. Lungs are clear. Cardiac exam regular rate and rhythm with no murmurs, rubs or bruits. Abdomen is soft. There may be some slight distention. Bowel sounds are present. He has no guarding or rebound tenderness. No obvious abdominal masses noted. He has no hepatomegaly. Back exam shows no tenderness over the spine, ribs or hips. Externally shows no clubbing, cyanosis or edema. There may be some slight swelling in the left knee. He has good range of motion of the knee. Skin exam shows no rashes, ecchymosis or petechia. Neurological exam is nonfocal.   Lab Results  Component Value Date   WBC 5.6 10/10/2015   HGB 15.4 10/10/2015   HCT 44.7 10/10/2015   MCV 93 10/10/2015   PLT 148 10/10/2015   No results found for: FERRITIN, IRON, TIBC, UIBC, IRONPCTSAT Lab Results  Component Value Date   RBC 4.82 10/10/2015   No results found for: KPAFRELGTCHN, LAMBDASER, KAPLAMBRATIO No results found for: IGGSERUM, IGA, IGMSERUM No results found for: Odetta Pink, SPEI   Chemistry      Component Value Date/Time    NA 139 10/10/2015 1021   NA 139 08/29/2015 0955   NA 137 01/26/2015 0815   K 4.0 10/10/2015 1021   K 4.2 08/29/2015 0955   K 4.1 01/26/2015 0815   CL 103 10/10/2015 1021   CL 104 01/26/2015 0815   CO2 29 10/10/2015 1021   CO2  26 08/29/2015 0955   CO2 26 01/26/2015 0815   BUN 17 10/10/2015 1021   BUN 13.2 08/29/2015 0955   BUN 14 01/26/2015 0815   CREATININE 0.8 10/10/2015 1021   CREATININE 0.8 08/29/2015 0955   CREATININE 0.82 01/26/2015 0815      Component Value Date/Time   CALCIUM 9.3 10/10/2015 1021   CALCIUM 9.1 08/29/2015 0955   CALCIUM 9.3 01/26/2015 0815   ALKPHOS 99* 10/10/2015 1021   ALKPHOS 134 08/29/2015 0955   ALKPHOS 98 01/26/2015 0815   AST 42* 10/10/2015 1021   AST 36* 08/29/2015 0955   AST 31 01/26/2015 0815   ALT 43 10/10/2015 1021   ALT 47 08/29/2015 0955   ALT 24 01/26/2015 0815   BILITOT 1.00 10/10/2015 1021   BILITOT 0.43 08/29/2015 0955   BILITOT 1.0 01/26/2015 0815     Impression and Plan: Mr. Goracke is 65 year old gentleman with metastatic colon cancer.   We will go ahead and give him his Third cycle of treatment today .  With his wife retiring, they want to go on low vacation this summer. As such, after his next treatment in June, we will hold off on treating him until early August. I just want him to be over enjoyed vacation with his wife. I think this is a very good idea. I really do not see much of a downside to him being off treatment for next month.  From my point of view, his quality of life is pretty good right now. This is what is important.  I will plan to see him back in one month.Volanda Napoleon, MD 5/9/20171:26 PM

## 2015-10-11 ENCOUNTER — Encounter: Payer: Self-pay | Admitting: *Deleted

## 2015-10-11 LAB — CEA: CEA1: 25.9 ng/mL — AB (ref 0.0–4.7)

## 2015-11-07 ENCOUNTER — Encounter: Payer: Self-pay | Admitting: Hematology & Oncology

## 2015-11-07 ENCOUNTER — Ambulatory Visit (HOSPITAL_BASED_OUTPATIENT_CLINIC_OR_DEPARTMENT_OTHER): Payer: BLUE CROSS/BLUE SHIELD

## 2015-11-07 ENCOUNTER — Ambulatory Visit (HOSPITAL_BASED_OUTPATIENT_CLINIC_OR_DEPARTMENT_OTHER): Payer: BLUE CROSS/BLUE SHIELD | Admitting: Hematology & Oncology

## 2015-11-07 ENCOUNTER — Other Ambulatory Visit: Payer: Self-pay | Admitting: *Deleted

## 2015-11-07 ENCOUNTER — Ambulatory Visit (HOSPITAL_BASED_OUTPATIENT_CLINIC_OR_DEPARTMENT_OTHER): Payer: BLUE CROSS/BLUE SHIELD | Admitting: *Deleted

## 2015-11-07 ENCOUNTER — Other Ambulatory Visit: Payer: Self-pay | Admitting: Family

## 2015-11-07 VITALS — BP 139/82 | HR 71 | Temp 98.2°F | Resp 18 | Ht 65.0 in | Wt 193.0 lb

## 2015-11-07 DIAGNOSIS — C787 Secondary malignant neoplasm of liver and intrahepatic bile duct: Principal | ICD-10-CM

## 2015-11-07 DIAGNOSIS — R110 Nausea: Secondary | ICD-10-CM

## 2015-11-07 DIAGNOSIS — R059 Cough, unspecified: Secondary | ICD-10-CM

## 2015-11-07 DIAGNOSIS — I10 Essential (primary) hypertension: Secondary | ICD-10-CM

## 2015-11-07 DIAGNOSIS — R112 Nausea with vomiting, unspecified: Secondary | ICD-10-CM

## 2015-11-07 DIAGNOSIS — C189 Malignant neoplasm of colon, unspecified: Secondary | ICD-10-CM

## 2015-11-07 DIAGNOSIS — F419 Anxiety disorder, unspecified: Secondary | ICD-10-CM

## 2015-11-07 DIAGNOSIS — Z5112 Encounter for antineoplastic immunotherapy: Secondary | ICD-10-CM

## 2015-11-07 DIAGNOSIS — Z5111 Encounter for antineoplastic chemotherapy: Secondary | ICD-10-CM | POA: Diagnosis not present

## 2015-11-07 DIAGNOSIS — R05 Cough: Secondary | ICD-10-CM

## 2015-11-07 LAB — CMP (CANCER CENTER ONLY)
ALK PHOS: 98 U/L — AB (ref 26–84)
ALT: 33 U/L (ref 10–47)
AST: 31 U/L (ref 11–38)
Albumin: 3.5 g/dL (ref 3.3–5.5)
BUN: 13 mg/dL (ref 7–22)
CALCIUM: 9.4 mg/dL (ref 8.0–10.3)
CHLORIDE: 102 meq/L (ref 98–108)
CO2: 28 mEq/L (ref 18–33)
Creat: 0.9 mg/dl (ref 0.6–1.2)
GLUCOSE: 105 mg/dL (ref 73–118)
POTASSIUM: 3.6 meq/L (ref 3.3–4.7)
Sodium: 141 mEq/L (ref 128–145)
TOTAL PROTEIN: 6.6 g/dL (ref 6.4–8.1)
Total Bilirubin: 0.9 mg/dl (ref 0.20–1.60)

## 2015-11-07 LAB — CBC WITH DIFFERENTIAL (CANCER CENTER ONLY)
BASO#: 0.1 10*3/uL (ref 0.0–0.2)
BASO%: 1.5 % (ref 0.0–2.0)
EOS ABS: 0.1 10*3/uL (ref 0.0–0.5)
EOS%: 2.3 % (ref 0.0–7.0)
HEMATOCRIT: 45 % (ref 38.7–49.9)
HGB: 15.6 g/dL (ref 13.0–17.1)
LYMPH#: 1.3 10*3/uL (ref 0.9–3.3)
LYMPH%: 27.2 % (ref 14.0–48.0)
MCH: 33 pg (ref 28.0–33.4)
MCHC: 34.7 g/dL (ref 32.0–35.9)
MCV: 95 fL (ref 82–98)
MONO#: 0.5 10*3/uL (ref 0.1–0.9)
MONO%: 11.2 % (ref 0.0–13.0)
NEUT#: 2.8 10*3/uL (ref 1.5–6.5)
NEUT%: 57.8 % (ref 40.0–80.0)
PLATELETS: 135 10*3/uL — AB (ref 145–400)
RBC: 4.73 10*6/uL (ref 4.20–5.70)
RDW: 16 % — AB (ref 11.1–15.7)
WBC: 4.8 10*3/uL (ref 4.0–10.0)

## 2015-11-07 MED ORDER — PALONOSETRON HCL INJECTION 0.25 MG/5ML
0.2500 mg | Freq: Once | INTRAVENOUS | Status: AC
  Administered 2015-11-07: 0.25 mg via INTRAVENOUS

## 2015-11-07 MED ORDER — ATROPINE SULFATE 1 MG/ML IJ SOLN
0.5000 mg | Freq: Once | INTRAMUSCULAR | Status: AC | PRN
  Administered 2015-11-07: 0.5 mg via INTRAVENOUS

## 2015-11-07 MED ORDER — IRINOTECAN HCL CHEMO INJECTION 100 MG/5ML
242.0000 mg/m2 | Freq: Once | INTRAVENOUS | Status: AC
  Administered 2015-11-07: 480 mg via INTRAVENOUS
  Filled 2015-11-07: qty 8

## 2015-11-07 MED ORDER — LORAZEPAM 2 MG/ML IJ SOLN
0.5000 mg | Freq: Once | INTRAMUSCULAR | Status: AC
  Administered 2015-11-07: 0.5 mg via INTRAVENOUS

## 2015-11-07 MED ORDER — AMLODIPINE BESY-BENAZEPRIL HCL 5-20 MG PO CAPS: 1.0000 | ORAL_CAPSULE | Freq: Every day | ORAL | Status: DC

## 2015-11-07 MED ORDER — SODIUM CHLORIDE 0.9 % IV SOLN
Freq: Once | INTRAVENOUS | Status: AC
  Administered 2015-11-07: 10:00:00 via INTRAVENOUS

## 2015-11-07 MED ORDER — LORAZEPAM 2 MG/ML IJ SOLN
INTRAMUSCULAR | Status: AC
  Filled 2015-11-07: qty 1

## 2015-11-07 MED ORDER — DIPHENHYDRAMINE HCL 50 MG/ML IJ SOLN
INTRAMUSCULAR | Status: AC
  Filled 2015-11-07: qty 1

## 2015-11-07 MED ORDER — PALONOSETRON HCL INJECTION 0.25 MG/5ML
INTRAVENOUS | Status: AC
  Filled 2015-11-07: qty 5

## 2015-11-07 MED ORDER — OXYCODONE-ACETAMINOPHEN 5-325 MG PO TABS: 1.0000 | ORAL_TABLET | Freq: Three times a day (TID) | ORAL | Status: DC | PRN

## 2015-11-07 MED ORDER — DIPHENHYDRAMINE HCL 50 MG/ML IJ SOLN
50.0000 mg | Freq: Once | INTRAMUSCULAR | Status: AC
  Administered 2015-11-07: 50 mg via INTRAVENOUS

## 2015-11-07 MED ORDER — SODIUM CHLORIDE 0.9% FLUSH
10.0000 mL | INTRAVENOUS | Status: DC | PRN
  Administered 2015-11-07: 10 mL
  Filled 2015-11-07: qty 10

## 2015-11-07 MED ORDER — ATROPINE SULFATE 1 MG/ML IJ SOLN
INTRAMUSCULAR | Status: AC
  Filled 2015-11-07: qty 1

## 2015-11-07 MED ORDER — HEPARIN SOD (PORK) LOCK FLUSH 100 UNIT/ML IV SOLN
500.0000 [IU] | Freq: Once | INTRAVENOUS | Status: AC | PRN
  Administered 2015-11-07: 500 [IU]
  Filled 2015-11-07: qty 5

## 2015-11-07 MED ORDER — CETUXIMAB CHEMO IV INJECTION 200 MG/100ML
500.0000 mg/m2 | Freq: Once | INTRAVENOUS | Status: AC
  Administered 2015-11-07: 1000 mg via INTRAVENOUS
  Filled 2015-11-07: qty 500

## 2015-11-07 NOTE — Patient Instructions (Signed)
[facility] Discharge Instructions for Patients Receiving Chemotherapy  Today you received the following chemotherapy agents Erbitux and Camptosar.   To help prevent nausea and vomiting after your treatment, we encourage you to take your nausea medication.  If you develop nausea and vomiting that is not controlled by your nausea medication, call the clinic.   BELOW ARE SYMPTOMS THAT SHOULD BE REPORTED IMMEDIATELY:  *FEVER GREATER THAN 100.5 F  *CHILLS WITH OR WITHOUT FEVER  NAUSEA AND VOMITING THAT IS NOT CONTROLLED WITH YOUR NAUSEA MEDICATION  *UNUSUAL SHORTNESS OF BREATH  *UNUSUAL BRUISING OR BLEEDING  TENDERNESS IN MOUTH AND THROAT WITH OR WITHOUT PRESENCE OF ULCERS  *URINARY PROBLEMS  *BOWEL PROBLEMS  UNUSUAL RASH Items with * indicate a potential emergency and should be followed up as soon as possible.  Feel free to call the clinic you have any questions or concerns. The clinic phone number is [phone].  Please show the [facility] at check-in to the Emergency Department and triage nurse.

## 2015-11-07 NOTE — Progress Notes (Signed)
Hematology and Oncology Follow Up Visit  Cavin Longman [id number] [birth year] 65 y.o. 11/07/2015   Principle Diagnosis:  Metastatic colon cancer - K-RAS wild-type - progressive  Current Therapy:   XELOX/Avastin s/p cycle 5 XELIRI/Erbitux - s/p c#3  (28 day cycles) Status post RFA of liver metastases    Interim History:  Mr. Nohr is here today for follow-up. He sees been doing pretty well. He is still tolerating the chemotherapy pretty well.   He and his wife 3 and ready to go on vacation. They basically will be gone for a month. They will be leaving in about 2 weeks. I want him to be able to enjoy themselves and we will do treatment when he gets back.  His CEA has been coming down again. His last CEA was 26.  He does have a little bit of a rash from Erbitux. This Xeloda also is causing some rash. He has been very diligent with putting on skin cream and sunscreen.  He has had no problems with cough or shortness of breath. He's had no mouth sores. He really has not had much in way of diarrhea.  There's been no nausea or vomiting.  Overall, his performance status is ECOG 0.   Medications: (   Medication List       This list is accurate as of: 11/07/15 10:05 AM.  Always use your most recent med list.               amLODipine-benazepril 5-20 MG capsule  Commonly known as:  LOTREL  Take 1 capsule by mouth daily.     baclofen 10 MG tablet  Commonly known as:  LIORESAL  Take 1 pill if needed every 8 hrs for hiccups     capecitabine 500 MG tablet  Commonly known as:  XELODA  Take 3 tablets (1,500 mg total) by mouth 2 (two) times daily after a meal. Take for 14 days, off 7 days.     Coenzyme Q10 10 MG capsule  Take 10 mg by mouth daily.     dexamethasone 4 MG tablet  Commonly known as:  DECADRON  Take 2 tablets (8 mg total) by mouth daily. Start the day after chemo for 2 days.     diclofenac sodium 1 % Gel  Commonly known as:  VOLTAREN  Apply 2 g topically 4 (four) times  daily.     doxycycline 100 MG tablet  Commonly known as:  VIBRA-TABS  Take 1 tablet (100 mg total) by mouth 2 (two) times daily.     gabapentin 100 MG capsule  Commonly known as:  NEURONTIN  Take 2 capsules (200 mg total) by mouth 3 (three) times daily. Pt okay to take only once a day     ibuprofen 200 MG tablet  Commonly known as:  ADVIL,MOTRIN  Take 200-400 mg by mouth every 6 (six) hours as needed.     lidocaine-prilocaine cream  Commonly known as:  EMLA  Apply 1 application topically as needed. Apply quarter sized amount to portacath site 1-2 hours prior to chemotherapy appt.  Cover with saran wrap.     loperamide 2 MG tablet  Commonly known as:  IMODIUM A-D  Take 2 tablets (4 mg total) by mouth 4 (four) times daily as needed. Take 2 at diarrhea onset , then 1 every 2hr until 12hrs with no BM. May take 2 every 4hrs at night. If diarrhea recurs repeat.     LORazepam 1 MG tablet  Commonly known as:  ATIVAN  Take 1 tablet (1 mg total) by mouth every 6 (six) hours as needed (NAUSEA).     oxyCODONE-acetaminophen 5-325 MG tablet  Commonly known as:  ROXICET  Take 1-2 tablets by mouth every 8 (eight) hours as needed for severe pain.     oxymetazoline 0.05 % nasal spray  Commonly known as:  AFRIN  Place 1 spray into both nostrils 2 (two) times daily as needed for congestion.     Pancrelipase (Lip-Prot-Amyl) 24000 units Cpep  Take 1 capsule (24,000 Units total) by mouth 3 (three) times daily before meals.     prochlorperazine 10 MG tablet  Commonly known as:  COMPAZINE  Take 1 tablet (10 mg total) by mouth every 6 (six) hours as needed (NAUSEA).        Allergies:  Allergies  Allergen Reactions  . Bactrim [Sulfamethoxazole-Trimethoprim] Diarrhea    Has taken 2 different times and both times frequent diarrhea  . Lyrica [Pregabalin]     "thought I was going to die"  . Zofran [Ondansetron Hcl] Other (See Comments)    Hiccups    Past Medical History, Surgical history,  Social history, and Family History were reviewed and updated.  Review of Systems: All other 10 point review of systems is negative.   Physical Exam:  height is 5' 5"  (1.651 m) and weight is 193 lb (87.544 kg). His oral temperature is 98.2 F (36.8 C). His blood pressure is 139/82 and his pulse is 71. His respiration is 18.   Wt Readings from Last 3 Encounters:  11/07/15 193 lb (87.544 kg)  10/10/15 191 lb (86.637 kg)  09/12/15 189 lb (85.73 kg)    Well-developed and well-nourished white male in no obvious distress. Head and neck exam shows no ocular or oral lesions. There are no palpable cervical or supraclavicular lymph nodes. Lungs are clear. Cardiac exam regular rate and rhythm with no murmurs, rubs or bruits. Abdomen is soft. There may be some slight distention. Bowel sounds are present. He has no guarding or rebound tenderness. No obvious abdominal masses noted. He has no hepatomegaly. Back exam shows no tenderness over the spine, ribs or hips. Externally shows no clubbing, cyanosis or edema. There may be some slight swelling in the left knee. He has good range of motion of the knee. Skin exam shows no rashes, ecchymosis or petechia. Neurological exam is nonfocal.   Lab Results  Component Value Date   WBC 4.8 11/07/2015   HGB 15.6 11/07/2015   HCT 45.0 11/07/2015   MCV 95 11/07/2015   PLT 135* 11/07/2015   No results found for: FERRITIN, IRON, TIBC, UIBC, IRONPCTSAT Lab Results  Component Value Date   RBC 4.73 11/07/2015   No results found for: KPAFRELGTCHN, LAMBDASER, KAPLAMBRATIO No results found for: IGGSERUM, IGA, IGMSERUM No results found for: Odetta Pink, SPEI   Chemistry      Component Value Date/Time   NA 141 11/07/2015 0849   NA 139 08/29/2015 0955   NA 137 01/26/2015 0815   K 3.6 11/07/2015 0849   K 4.2 08/29/2015 0955   K 4.1 01/26/2015 0815   CL 102 11/07/2015 0849   CL 104 01/26/2015 0815   CO2 28  11/07/2015 0849   CO2 26 08/29/2015 0955   CO2 26 01/26/2015 0815   BUN 13 11/07/2015 0849   BUN 13.2 08/29/2015 0955   BUN 14 01/26/2015 0815   CREATININE 0.9 11/07/2015 0849   CREATININE 0.8 08/29/2015 0955  CREATININE 0.82 01/26/2015 0815      Component Value Date/Time   CALCIUM 9.4 11/07/2015 0849   CALCIUM 9.1 08/29/2015 0955   CALCIUM 9.3 01/26/2015 0815   ALKPHOS 98* 11/07/2015 0849   ALKPHOS 134 08/29/2015 0955   ALKPHOS 98 01/26/2015 0815   AST 31 11/07/2015 0849   AST 36* 08/29/2015 0955   AST 31 01/26/2015 0815   ALT 33 11/07/2015 0849   ALT 47 08/29/2015 0955   ALT 24 01/26/2015 0815   BILITOT 0.90 11/07/2015 0849   BILITOT 0.43 08/29/2015 0955   BILITOT 1.0 01/26/2015 0815     Impression and Plan: Mr. Borner is 65 year old gentleman with metastatic colon cancer.   We will go ahead and give him his Fourth cycle of treatment today .  We will go ahead and plan for follow-up scans after he gets back from his vacation. We will get CT scans set up the end of July.  I will refill his pain medication. He only takes 1 or 2 pills a day. This really helps him become more mobile and functional. I will also refill his high blood pressure medication.   I will plan to see him back on August 1    Volanda Napoleon, MD 6/6/201710:05 AM

## 2015-11-08 ENCOUNTER — Encounter: Payer: Self-pay | Admitting: *Deleted

## 2015-11-08 LAB — CEA: CEA: 18 ng/mL — ABNORMAL HIGH (ref 0.0–4.7)

## 2015-11-14 ENCOUNTER — Other Ambulatory Visit: Payer: Self-pay | Admitting: Nurse Practitioner

## 2015-12-08 ENCOUNTER — Other Ambulatory Visit: Payer: Self-pay | Admitting: Nurse Practitioner

## 2015-12-08 DIAGNOSIS — I10 Essential (primary) hypertension: Secondary | ICD-10-CM

## 2015-12-08 DIAGNOSIS — R112 Nausea with vomiting, unspecified: Secondary | ICD-10-CM

## 2015-12-08 DIAGNOSIS — C189 Malignant neoplasm of colon, unspecified: Secondary | ICD-10-CM

## 2015-12-08 DIAGNOSIS — C787 Secondary malignant neoplasm of liver and intrahepatic bile duct: Principal | ICD-10-CM

## 2015-12-08 MED ORDER — AMLODIPINE BESY-BENAZEPRIL HCL 5-20 MG PO CAPS: 1.0000 | ORAL_CAPSULE | Freq: Every day | ORAL | Status: DC

## 2015-12-25 ENCOUNTER — Other Ambulatory Visit: Payer: Self-pay | Admitting: *Deleted

## 2015-12-25 DIAGNOSIS — C787 Secondary malignant neoplasm of liver and intrahepatic bile duct: Principal | ICD-10-CM

## 2015-12-25 DIAGNOSIS — C189 Malignant neoplasm of colon, unspecified: Secondary | ICD-10-CM

## 2015-12-25 DIAGNOSIS — R112 Nausea with vomiting, unspecified: Secondary | ICD-10-CM

## 2015-12-25 DIAGNOSIS — I10 Essential (primary) hypertension: Secondary | ICD-10-CM

## 2015-12-25 MED ORDER — AMLODIPINE BESY-BENAZEPRIL HCL 5-20 MG PO CAPS: 1.0000 | ORAL_CAPSULE | Freq: Every day | ORAL | 3 refills | Status: DC

## 2015-12-25 MED ORDER — OXYCODONE-ACETAMINOPHEN 5-325 MG PO TABS: 1.0000 | ORAL_TABLET | Freq: Three times a day (TID) | ORAL | 0 refills | Status: DC | PRN

## 2015-12-26 ENCOUNTER — Other Ambulatory Visit (HOSPITAL_BASED_OUTPATIENT_CLINIC_OR_DEPARTMENT_OTHER): Payer: BLUE CROSS/BLUE SHIELD

## 2015-12-26 DIAGNOSIS — C787 Secondary malignant neoplasm of liver and intrahepatic bile duct: Secondary | ICD-10-CM

## 2015-12-26 DIAGNOSIS — C189 Malignant neoplasm of colon, unspecified: Secondary | ICD-10-CM | POA: Diagnosis not present

## 2015-12-26 DIAGNOSIS — I10 Essential (primary) hypertension: Secondary | ICD-10-CM

## 2015-12-26 DIAGNOSIS — R112 Nausea with vomiting, unspecified: Secondary | ICD-10-CM

## 2015-12-26 LAB — COMPREHENSIVE METABOLIC PANEL
ALBUMIN: 3.8 g/dL (ref 3.5–5.0)
ALK PHOS: 109 U/L (ref 40–150)
ALT: 30 U/L (ref 0–55)
AST: 28 U/L (ref 5–34)
Anion Gap: 10 mEq/L (ref 3–11)
BILIRUBIN TOTAL: 0.77 mg/dL (ref 0.20–1.20)
BUN: 16.8 mg/dL (ref 7.0–26.0)
CALCIUM: 9.3 mg/dL (ref 8.4–10.4)
CO2: 24 mEq/L (ref 22–29)
CREATININE: 0.8 mg/dL (ref 0.7–1.3)
Chloride: 106 mEq/L (ref 98–109)
EGFR: >90 mL/min/{1.73_m2} (ref >90)
Glucose: 108 mg/dl (ref 70–140)
POTASSIUM: 4.1 meq/L (ref 3.5–5.1)
Sodium: 140 mEq/L (ref 136–145)
Total Protein: 7.2 g/dL (ref 6.4–8.3)

## 2015-12-26 LAB — CBC WITH DIFFERENTIAL (CANCER CENTER ONLY)
BASO#: 0.1 10*3/uL (ref 0.0–0.2)
BASO%: 1.4 % (ref 0.0–2.0)
EOS%: 3.4 % (ref 0.0–7.0)
Eosinophils Absolute: 0.2 10*3/uL (ref 0.0–0.5)
HEMATOCRIT: 45.5 % (ref 38.7–49.9)
HGB: 15.8 g/dL (ref 13.0–17.1)
LYMPH#: 1.3 10*3/uL (ref 0.9–3.3)
LYMPH%: 23.8 % (ref 14.0–48.0)
MCH: 33.5 pg — ABNORMAL HIGH (ref 28.0–33.4)
MCHC: 34.7 g/dL (ref 32.0–35.9)
MCV: 96 fL (ref 82–98)
MONO#: 0.5 10*3/uL (ref 0.1–0.9)
MONO%: 9.1 % (ref 0.0–13.0)
NEUT#: 3.5 10*3/uL (ref 1.5–6.5)
NEUT%: 62.3 % (ref 40.0–80.0)
Platelets: 151 10*3/uL (ref 145–400)
RBC: 4.72 10*6/uL (ref 4.20–5.70)
RDW: 12.6 % (ref 11.1–15.7)
WBC: 5.6 10*3/uL (ref 4.0–10.0)

## 2015-12-26 LAB — MAGNESIUM: Magnesium: 2.1 mg/dl (ref 1.5–2.5)

## 2015-12-27 ENCOUNTER — Ambulatory Visit (HOSPITAL_COMMUNITY)
Admission: RE | Admit: 2015-12-27 | Discharge: 2015-12-27 | Disposition: A | Discharge status: Home / Self Care | Payer: BLUE CROSS/BLUE SHIELD | Source: Ambulatory Visit | Attending: Hematology & Oncology | Admitting: Hematology & Oncology

## 2015-12-27 ENCOUNTER — Other Ambulatory Visit: Payer: BLUE CROSS/BLUE SHIELD

## 2015-12-27 ENCOUNTER — Ambulatory Visit: Payer: BLUE CROSS/BLUE SHIELD

## 2015-12-27 ENCOUNTER — Encounter (HOSPITAL_COMMUNITY): Payer: Self-pay

## 2015-12-27 DIAGNOSIS — C787 Secondary malignant neoplasm of liver and intrahepatic bile duct: Secondary | ICD-10-CM | POA: Insufficient documentation

## 2015-12-27 DIAGNOSIS — R112 Nausea with vomiting, unspecified: Secondary | ICD-10-CM | POA: Diagnosis present

## 2015-12-27 DIAGNOSIS — I10 Essential (primary) hypertension: Secondary | ICD-10-CM | POA: Insufficient documentation

## 2015-12-27 DIAGNOSIS — I700 Atherosclerosis of aorta: Secondary | ICD-10-CM | POA: Insufficient documentation

## 2015-12-27 DIAGNOSIS — R590 Localized enlarged lymph nodes: Secondary | ICD-10-CM | POA: Insufficient documentation

## 2015-12-27 DIAGNOSIS — C189 Malignant neoplasm of colon, unspecified: Secondary | ICD-10-CM | POA: Insufficient documentation

## 2015-12-27 DIAGNOSIS — I2510 Atherosclerotic heart disease of native coronary artery without angina pectoris: Secondary | ICD-10-CM | POA: Diagnosis not present

## 2015-12-27 LAB — CEA: CEA: 31.2 ng/mL — ABNORMAL HIGH (ref 0.0–4.7)

## 2015-12-27 MED ORDER — IOPAMIDOL (ISOVUE-300) INJECTION 61%
100.0000 mL | Freq: Once | INTRAVENOUS | Status: AC | PRN
  Administered 2015-12-27: 100 mL via INTRAVENOUS

## 2016-01-02 ENCOUNTER — Ambulatory Visit (HOSPITAL_BASED_OUTPATIENT_CLINIC_OR_DEPARTMENT_OTHER): Payer: BLUE CROSS/BLUE SHIELD

## 2016-01-02 ENCOUNTER — Other Ambulatory Visit: Payer: BLUE CROSS/BLUE SHIELD

## 2016-01-02 ENCOUNTER — Encounter: Payer: Self-pay | Admitting: Hematology & Oncology

## 2016-01-02 ENCOUNTER — Ambulatory Visit (HOSPITAL_BASED_OUTPATIENT_CLINIC_OR_DEPARTMENT_OTHER): Payer: BLUE CROSS/BLUE SHIELD | Admitting: Hematology & Oncology

## 2016-01-02 VITALS — BP 149/75 | HR 75 | Temp 98.0°F | Resp 16 | Ht 65.0 in | Wt 199.0 lb

## 2016-01-02 DIAGNOSIS — C787 Secondary malignant neoplasm of liver and intrahepatic bile duct: Secondary | ICD-10-CM

## 2016-01-02 DIAGNOSIS — C189 Malignant neoplasm of colon, unspecified: Secondary | ICD-10-CM

## 2016-01-02 DIAGNOSIS — Z5111 Encounter for antineoplastic chemotherapy: Secondary | ICD-10-CM

## 2016-01-02 MED ORDER — PALONOSETRON HCL INJECTION 0.25 MG/5ML
0.2500 mg | Freq: Once | INTRAVENOUS | Status: AC
  Administered 2016-01-02: 0.25 mg via INTRAVENOUS

## 2016-01-02 MED ORDER — CETUXIMAB CHEMO IV INJECTION 200 MG/100ML
500.0000 mg/m2 | Freq: Once | INTRAVENOUS | Status: AC
  Administered 2016-01-02: 1000 mg via INTRAVENOUS
  Filled 2016-01-02: qty 500

## 2016-01-02 MED ORDER — SODIUM CHLORIDE 0.9 % IV SOLN
Freq: Once | INTRAVENOUS | Status: AC
  Administered 2016-01-02: 10:00:00 via INTRAVENOUS

## 2016-01-02 MED ORDER — ATROPINE SULFATE 1 MG/ML IJ SOLN
0.5000 mg | Freq: Once | INTRAMUSCULAR | Status: AC | PRN
  Administered 2016-01-02: 0.5 mg via INTRAVENOUS

## 2016-01-02 MED ORDER — SODIUM CHLORIDE 0.9% FLUSH
10.0000 mL | INTRAVENOUS | Status: DC | PRN
Start: 2016-01-02 — End: 2016-01-02
  Administered 2016-01-02: 10 mL
  Filled 2016-01-02: qty 10

## 2016-01-02 MED ORDER — ATROPINE SULFATE 1 MG/ML IJ SOLN
INTRAMUSCULAR | Status: AC
  Filled 2016-01-02: qty 1

## 2016-01-02 MED ORDER — DIPHENHYDRAMINE HCL 50 MG/ML IJ SOLN
INTRAMUSCULAR | Status: AC
  Filled 2016-01-02: qty 1

## 2016-01-02 MED ORDER — IRINOTECAN HCL CHEMO INJECTION 100 MG/5ML
245.0000 mg/m2 | Freq: Once | INTRAVENOUS | Status: AC
  Administered 2016-01-02: 480 mg via INTRAVENOUS
  Filled 2016-01-02: qty 15

## 2016-01-02 MED ORDER — PALONOSETRON HCL INJECTION 0.25 MG/5ML
INTRAVENOUS | Status: AC
  Filled 2016-01-02: qty 5

## 2016-01-02 MED ORDER — DIPHENHYDRAMINE HCL 50 MG/ML IJ SOLN
50.0000 mg | Freq: Once | INTRAMUSCULAR | Status: AC
  Administered 2016-01-02: 50 mg via INTRAVENOUS

## 2016-01-02 MED ORDER — HEPARIN SOD (PORK) LOCK FLUSH 100 UNIT/ML IV SOLN
500.0000 [IU] | Freq: Once | INTRAVENOUS | Status: AC | PRN
  Administered 2016-01-02: 500 [IU]
  Filled 2016-01-02: qty 5

## 2016-01-02 NOTE — Patient Instructions (Signed)
[facility] Discharge Instructions for Patients Receiving Chemotherapy  Today you received the following chemotherapy agents Camptosar, Erbitux  To help prevent nausea and vomiting after your treatment, we encourage you to take your nausea medication    If you develop nausea and vomiting that is not controlled by your nausea medication, call the clinic.   BELOW ARE SYMPTOMS THAT SHOULD BE REPORTED IMMEDIATELY:  *FEVER GREATER THAN 100.5 F  *CHILLS WITH OR WITHOUT FEVER  NAUSEA AND VOMITING THAT IS NOT CONTROLLED WITH YOUR NAUSEA MEDICATION  *UNUSUAL SHORTNESS OF BREATH  *UNUSUAL BRUISING OR BLEEDING  TENDERNESS IN MOUTH AND THROAT WITH OR WITHOUT PRESENCE OF ULCERS  *URINARY PROBLEMS  *BOWEL PROBLEMS  UNUSUAL RASH Items with * indicate a potential emergency and should be followed up as soon as possible.  Feel free to call the clinic you have any questions or concerns. The clinic phone number is [phone].  Please show the [facility] at check-in to the Emergency Department and triage nurse.

## 2016-01-02 NOTE — Progress Notes (Signed)
Hematology and Oncology Follow Up Visit  Ryan Maynard [id number] [birth year] 65 y.o. 01/02/2016   Principle Diagnosis:  Metastatic colon cancer - K-RAS wild-type - progressive  Current Therapy:   XELOX/Avastin s/p cycle 5 XELIRI/Erbitux - s/p c#4  (28 day cycles) Status post RFA of liver metastases    Interim History:  Ryan Maynard is here today for follow-up. He is now comeback from his vacation. He and his wife will basically off for the month of July. This did him return of good area and he really felt much better. The last treatment was back in early June.  He feels much more energetic. He was is happy to be off treatment for a good month.  We did go ahead and repeat his CT scans. This was done on July 27. This showed that everything looked fairly stable.  His CEA did go up a little bit. It went from 18 up to 31. This definitely is not surprising.  He has had no problems with nausea or vomiting. He's had no problems with bowels or bladder. Sometimes has diarrhea, sometimes he has constipation.  He's had no problems with cough. He's had no skin problems.  Overall, his performance status is ECOG 0. Medications: (   Medication List       Accurate as of 01/02/16  9:58 AM. Always use your most recent med list.          amLODipine-benazepril 5-20 MG capsule Commonly known as:  LOTREL Take 1 capsule by mouth daily.   baclofen 10 MG tablet Commonly known as:  LIORESAL Take 1 pill if needed every 8 hrs for hiccups   capecitabine 500 MG tablet Commonly known as:  XELODA Take 3 tablets (1,500 mg total) by mouth 2 (two) times daily after a meal. Take for 14 days, off 7 days.   Coenzyme Q10 10 MG capsule Take 10 mg by mouth daily.   dexamethasone 4 MG tablet Commonly known as:  DECADRON Take 2 tablets (8 mg total) by mouth daily. Start the day after chemo for 2 days.   diclofenac sodium 1 % Gel Commonly known as:  VOLTAREN Apply 2 g topically 4 (four) times daily.     doxycycline 100 MG tablet Commonly known as:  VIBRA-TABS Take 1 tablet (100 mg total) by mouth 2 (two) times daily.   gabapentin 100 MG capsule Commonly known as:  NEURONTIN Take 2 capsules (200 mg total) by mouth 3 (three) times daily. Pt okay to take only once a day   ibuprofen 200 MG tablet Commonly known as:  ADVIL,MOTRIN Take 200-400 mg by mouth every 6 (six) hours as needed.   lidocaine-prilocaine cream Commonly known as:  EMLA Apply 1 application topically as needed. Apply quarter sized amount to portacath site 1-2 hours prior to chemotherapy appt.  Cover with saran wrap.   loperamide 2 MG tablet Commonly known as:  IMODIUM A-D Take 2 tablets (4 mg total) by mouth 4 (four) times daily as needed. Take 2 at diarrhea onset , then 1 every 2hr until 12hrs with no BM. May take 2 every 4hrs at night. If diarrhea recurs repeat.   LORazepam 1 MG tablet Commonly known as:  ATIVAN Take 1 tablet (1 mg total) by mouth every 6 (six) hours as needed (NAUSEA).   oxyCODONE-acetaminophen 5-325 MG tablet Commonly known as:  ROXICET Take 1-2 tablets by mouth every 8 (eight) hours as needed for severe pain.   oxymetazoline 0.05 % nasal spray Commonly known as:  AFRIN Place 1 spray into both nostrils 2 (two) times daily as needed for congestion.   Pancrelipase (Lip-Prot-Amyl) 24000 units Cpep Take 1 capsule (24,000 Units total) by mouth 3 (three) times daily before meals.   prochlorperazine 10 MG tablet Commonly known as:  COMPAZINE Take 1 tablet (10 mg total) by mouth every 6 (six) hours as needed (NAUSEA).       Allergies:  Allergies  Allergen Reactions  . Bactrim [Sulfamethoxazole-Trimethoprim] Diarrhea    Has taken 2 different times and both times frequent diarrhea  . Lyrica [Pregabalin]     "thought I was going to die"  . Zofran [Ondansetron Hcl] Other (See Comments)    Hiccups    Past Medical History, Surgical history, Social history, and Family History were reviewed and  updated.  Review of Systems: All other 10 point review of systems is negative.   Physical Exam:  height is _0  (1.651 m) and weight is 199 lb (90.3 kg). His oral temperature is 98 F (36.7 C). His blood pressure is 149/75 (abnormal) and his pulse is 75. His respiration is 16.   Wt Readings from Last 3 Encounters:  01/02/16 199 lb (90.3 kg)  11/07/15 193 lb (87.5 kg)  10/10/15 191 lb (86.6 kg)    Well-developed and well-nourished white male in no obvious distress. Head and neck exam shows no ocular or oral lesions. There are no palpable cervical or supraclavicular lymph nodes. Lungs are clear. Cardiac exam regular rate and rhythm with no murmurs, rubs or bruits. Abdomen is soft. There may be some slight distention. Bowel sounds are present. He has no guarding or rebound tenderness. No obvious abdominal masses noted. He has no hepatomegaly. Back exam shows no tenderness over the spine, ribs or hips. Externally shows no clubbing, cyanosis or edema. There may be some slight swelling in the left knee. He has good range of motion of the knee. Skin exam shows no rashes, ecchymosis or petechia. Neurological exam is nonfocal.   Lab Results  Component Value Date   WBC 5.6 12/26/2015   HGB 15.8 12/26/2015   HCT 45.5 12/26/2015   MCV 96 12/26/2015   PLT 151 12/26/2015   No results found for: FERRITIN, IRON, TIBC, UIBC, IRONPCTSAT Lab Results  Component Value Date   RBC 4.72 12/26/2015   No results found for: KPAFRELGTCHN, LAMBDASER, KAPLAMBRATIO No results found for: IGGSERUM, IGA, IGMSERUM No results found for: Odetta Pink, SPEI   Chemistry      Component Value Date/Time   NA 140 12/26/2015 1013   K 4.1 12/26/2015 1013   CL 102 11/07/2015 0849   CO2 24 12/26/2015 1013   BUN 16.8 12/26/2015 1013   CREATININE 0.8 12/26/2015 1013      Component Value Date/Time   CALCIUM 9.3 12/26/2015 1013   ALKPHOS 109 12/26/2015 1013   AST 28  12/26/2015 1013   ALT 30 12/26/2015 1013   BILITOT 0.77 12/26/2015 1013     Impression and Plan: Ryan Maynard is 65 year old gentleman with metastatic colon cancer.   We will go ahead and give him his 5th cycle of treatment today .  We will have to see how his CEA level goes. This always is a good indicator as to how he is doing.   We'll plan to get him back in another 4 weeks.  Volanda Napoleon, MD 8/1/20179:58 AM

## 2016-01-03 ENCOUNTER — Telehealth: Payer: Self-pay | Admitting: *Deleted

## 2016-01-03 NOTE — Telephone Encounter (Signed)
Patient wife called stating that paitent has been up throwing up all night and has a throbbing headache.  Dr. Marin Olp notified. Told wife to have Dijuan take Decadron 8 mg bid for 3 days and Ativan 1 mg every 6 hours as needed for nausea.  Wife states he is too dizzy to come in for fluids.  Told wife to call us back with update

## 2016-01-04 ENCOUNTER — Other Ambulatory Visit: Payer: Self-pay | Admitting: *Deleted

## 2016-01-04 DIAGNOSIS — C787 Secondary malignant neoplasm of liver and intrahepatic bile duct: Principal | ICD-10-CM

## 2016-01-04 DIAGNOSIS — C189 Malignant neoplasm of colon, unspecified: Secondary | ICD-10-CM

## 2016-01-04 MED ORDER — LORAZEPAM 1 MG PO TABS: 1.0000 mg | ORAL_TABLET | Freq: Four times a day (QID) | ORAL | 2 refills | Status: AC | PRN

## 2016-01-23 ENCOUNTER — Ambulatory Visit (HOSPITAL_BASED_OUTPATIENT_CLINIC_OR_DEPARTMENT_OTHER): Payer: BLUE CROSS/BLUE SHIELD | Admitting: Hematology & Oncology

## 2016-01-23 ENCOUNTER — Encounter: Payer: Self-pay | Admitting: Hematology & Oncology

## 2016-01-23 DIAGNOSIS — C189 Malignant neoplasm of colon, unspecified: Secondary | ICD-10-CM | POA: Diagnosis not present

## 2016-01-23 DIAGNOSIS — C787 Secondary malignant neoplasm of liver and intrahepatic bile duct: Secondary | ICD-10-CM | POA: Diagnosis not present

## 2016-01-23 DIAGNOSIS — R112 Nausea with vomiting, unspecified: Secondary | ICD-10-CM

## 2016-01-23 DIAGNOSIS — I10 Essential (primary) hypertension: Secondary | ICD-10-CM | POA: Diagnosis not present

## 2016-01-23 MED ORDER — OXYCODONE-ACETAMINOPHEN 5-325 MG PO TABS: 1.0000 | ORAL_TABLET | Freq: Three times a day (TID) | ORAL | 0 refills | Status: DC | PRN

## 2016-01-23 MED ORDER — GABAPENTIN 100 MG PO CAPS: 200.0000 mg | ORAL_CAPSULE | Freq: Three times a day (TID) | ORAL | 3 refills | Status: DC

## 2016-01-23 NOTE — Progress Notes (Signed)
Hematology and Oncology Follow Up Visit  Ryan Maynard [id number] Aug 09, 1950 65 y.o. 01/23/2016   Principle Diagnosis:  Metastatic colon cancer - K-RAS wild-type - progressive  Current Therapy:   XELOX/Avastin s/p cycle 5 XELIRI/Erbitux - s/p c#4  (28 day cycles) - omit the Irinotecan Status post RFA of liver metastases    Interim History:  [name] is here today for follow-up. He really wanted to talk to me about the problems that he have his last cycle of chemotherapy. He just felt horrible area and he said the day after he got treatment, he just felt bad. He had a lot of nausea. He got sick on his stomach. He had a lot of fatigue. He had no energy. He felt this way for a couple weeks. He had some diarrhea.  He says that he will not take any more treatment if he continues to feel this way. He is starting to feel better. He and his wife actually went down to [place] to see the [employer]. They got back to 1 AM this morning.  His last CEA level was 31. This was the end of July. He'll be interesting to see what this does over the next month or so.  He has had no bleeding.  There's been no leg swelling. He has had some pain issues from the liver. He is on Percocet for this which is fine. I keep trying to encourage him to make sure he takes enough pain medicine so that he does not have discomfort.  His skin actually is doing pretty well despite being on the Erbitux.  Overall, his performance status is ECOG 1 . Medications: (   Medication List       Accurate as of 01/23/16 12:56 PM. Always use your most recent med list.          amLODipine-benazepril 5-20 MG capsule Commonly known as:  LOTREL Take 1 capsule by mouth daily.   baclofen 10 MG tablet Commonly known as:  LIORESAL Take 1 pill if needed every 8 hrs for hiccups   capecitabine 500 MG tablet Commonly known as:  XELODA Take 3 tablets (1,500 mg total) by mouth 2 (two) times daily after a meal. Take for 14 days, off  7 days.   Coenzyme Q10 10 MG capsule Take 10 mg by mouth daily.   dexamethasone 4 MG tablet Commonly known as:  DECADRON Take 2 tablets (8 mg total) by mouth daily. Start the day after chemo for 2 days.   diclofenac sodium 1 % Gel Commonly known as:  VOLTAREN Apply 2 g topically 4 (four) times daily.   doxycycline 100 MG tablet Commonly known as:  VIBRA-TABS Take 1 tablet (100 mg total) by mouth 2 (two) times daily.   gabapentin 100 MG capsule Commonly known as:  NEURONTIN Take 2 capsules (200 mg total) by mouth 3 (three) times daily. Pt okay to take only once a day   ibuprofen 200 MG tablet Commonly known as:  ADVIL,MOTRIN Take 200-400 mg by mouth every 6 (six) hours as needed.   lidocaine-prilocaine cream Commonly known as:  EMLA Apply 1 application topically as needed. Apply quarter sized amount to portacath site 1-2 hours prior to chemotherapy appt.  Cover with saran wrap.   loperamide 2 MG tablet Commonly known as:  IMODIUM A-D Take 2 tablets (4 mg total) by mouth 4 (four) times daily as needed. Take 2 at diarrhea onset , then 1 every 2hr until 12hrs with no BM. May take  2 every 4hrs at night. If diarrhea recurs repeat.   LORazepam 1 MG tablet Commonly known as:  ATIVAN Take 1 tablet (1 mg total) by mouth every 6 (six) hours as needed (NAUSEA).   oxyCODONE-acetaminophen 5-325 MG tablet Commonly known as:  ROXICET Take 1-2 tablets by mouth every 8 (eight) hours as needed for severe pain.   oxymetazoline 0.05 % nasal spray Commonly known as:  AFRIN Place 1 spray into both nostrils 2 (two) times daily as needed for congestion.   Pancrelipase (Lip-Prot-Amyl) 24000 units Cpep Take 1 capsule (24,000 Units total) by mouth 3 (three) times daily before meals.   prochlorperazine 10 MG tablet Commonly known as:  COMPAZINE Take 1 tablet (10 mg total) by mouth every 6 (six) hours as needed (NAUSEA).       Allergies:  Allergies  Allergen Reactions  . Bactrim  [Sulfamethoxazole-Trimethoprim] Diarrhea    Has taken 2 different times and both times frequent diarrhea  . Lyrica [Pregabalin]     "thought I was going to die"  . Zofran [Ondansetron Hcl] Other (See Comments)    Hiccups    Past Medical History, Surgical history, Social history, and Family History were reviewed and updated.  Review of Systems: All other 10 point review of systems is negative.   Physical Exam:  height is 5' 5" (1.651 m) and weight is 195 lb (88.5 kg). His oral temperature is 98.2 F (36.8 C). His blood pressure is 138/75 and his pulse is 75. His respiration is 16.   Wt Readings from Last 3 Encounters:  01/23/16 195 lb (88.5 kg)  01/02/16 199 lb (90.3 kg)  11/07/15 193 lb (87.5 kg)    Well-developed and well-nourished white male in no obvious distress. Head and neck exam shows no ocular or oral lesions. There are no palpable cervical or supraclavicular lymph nodes. Lungs are clear. Cardiac exam regular rate and rhythm with no murmurs, rubs or bruits. Abdomen is soft. There may be some slight distention. Bowel sounds are present. He has no guarding or rebound tenderness. No obvious abdominal masses noted. He has no hepatomegaly. Back exam shows no tenderness over the spine, ribs or hips. Externally shows no clubbing, cyanosis or edema. There may be some slight swelling in the left knee. He has good range of motion of the knee. Skin exam shows no rashes, ecchymosis or petechia. Neurological exam is nonfocal.   Lab Results  Component Value Date   WBC 5.6 12/26/2015   HGB 15.8 12/26/2015   HCT 45.5 12/26/2015   MCV 96 12/26/2015   PLT 151 12/26/2015   No results found for: FERRITIN, IRON, TIBC, UIBC, IRONPCTSAT Lab Results  Component Value Date   RBC 4.72 12/26/2015   No results found for: KPAFRELGTCHN, LAMBDASER, KAPLAMBRATIO No results found for: IGGSERUM, IGA, IGMSERUM No results found for: Odetta Pink,  SPEI   Chemistry      Component Value Date/Time   NA 140 12/26/2015 1013   K 4.1 12/26/2015 1013   CL 102 11/07/2015 0849   CO2 24 12/26/2015 1013   BUN 16.8 12/26/2015 1013   CREATININE 0.8 12/26/2015 1013      Component Value Date/Time   CALCIUM 9.3 12/26/2015 1013   ALKPHOS 109 12/26/2015 1013   AST 28 12/26/2015 1013   ALT 30 12/26/2015 1013   BILITOT 0.77 12/26/2015 1013     Impression and Plan: Mr. Odonoghue is 65 year old gentleman with metastatic colon cancer.   He really  had a total time of the last cycle of chemotherapy. He says he will does not want take any more chemotherapy if he feels that way. I totally understand this. He is not curable. He has done incredibly well so far.  What we will do that we will omit the irinotecan. He will just be on Xeloda with the Erbitux. We will have to see how this does for him.  He is agreeable to this.  I will have him come back in one week to see Judson Roch and make sure everything is okay before he starts his next cycle of treatment.    if Volanda Napoleon, MD 8/22/201712:56 PM

## 2016-01-30 ENCOUNTER — Ambulatory Visit (HOSPITAL_BASED_OUTPATIENT_CLINIC_OR_DEPARTMENT_OTHER): Payer: BLUE CROSS/BLUE SHIELD

## 2016-01-30 ENCOUNTER — Ambulatory Visit: Payer: BLUE CROSS/BLUE SHIELD | Admitting: Hematology & Oncology

## 2016-01-30 ENCOUNTER — Other Ambulatory Visit (HOSPITAL_BASED_OUTPATIENT_CLINIC_OR_DEPARTMENT_OTHER): Payer: BLUE CROSS/BLUE SHIELD

## 2016-01-30 ENCOUNTER — Other Ambulatory Visit: Payer: Self-pay | Admitting: Family

## 2016-01-30 ENCOUNTER — Other Ambulatory Visit: Payer: BLUE CROSS/BLUE SHIELD

## 2016-01-30 VITALS — BP 144/87 | HR 75 | Temp 98.2°F | Resp 18

## 2016-01-30 DIAGNOSIS — C787 Secondary malignant neoplasm of liver and intrahepatic bile duct: Secondary | ICD-10-CM

## 2016-01-30 DIAGNOSIS — C189 Malignant neoplasm of colon, unspecified: Secondary | ICD-10-CM

## 2016-01-30 DIAGNOSIS — Z5112 Encounter for antineoplastic immunotherapy: Secondary | ICD-10-CM

## 2016-01-30 LAB — CMP (CANCER CENTER ONLY)
ALBUMIN: 3.3 g/dL (ref 3.3–5.5)
ALT(SGPT): 43 U/L (ref 10–47)
AST: 38 U/L (ref 11–38)
Alkaline Phosphatase: 114 U/L — ABNORMAL HIGH (ref 26–84)
BILIRUBIN TOTAL: 0.9 mg/dL (ref 0.20–1.60)
BUN: 13 mg/dL (ref 7–22)
CHLORIDE: 106 meq/L (ref 98–108)
CO2: 32 meq/L (ref 18–33)
CREATININE: 0.8 mg/dL (ref 0.6–1.2)
Calcium: 9.1 mg/dL (ref 8.0–10.3)
Glucose, Bld: 106 mg/dL (ref 73–118)
Potassium: 4.2 mEq/L (ref 3.3–4.7)
SODIUM: 136 meq/L (ref 128–145)
TOTAL PROTEIN: 6.9 g/dL (ref 6.4–8.1)

## 2016-01-30 LAB — CBC WITH DIFFERENTIAL (CANCER CENTER ONLY)
BASO#: 0.1 10*3/uL (ref 0.0–0.2)
BASO%: 1.6 % (ref 0.0–2.0)
EOS%: 1.6 % (ref 0.0–7.0)
Eosinophils Absolute: 0.1 10*3/uL (ref 0.0–0.5)
HCT: 44.1 % (ref 38.7–49.9)
HGB: 15.4 g/dL (ref 13.0–17.1)
LYMPH#: 1.2 10*3/uL (ref 0.9–3.3)
LYMPH%: 31.3 % (ref 14.0–48.0)
MCH: 32.4 pg (ref 28.0–33.4)
MCHC: 34.9 g/dL (ref 32.0–35.9)
MCV: 93 fL (ref 82–98)
MONO#: 0.5 10*3/uL (ref 0.1–0.9)
MONO%: 13.6 % — ABNORMAL HIGH (ref 0.0–13.0)
NEUT#: 1.9 10*3/uL (ref 1.5–6.5)
NEUT%: 51.9 % (ref 40.0–80.0)
PLATELETS: 203 10*3/uL (ref 145–400)
RBC: 4.76 10*6/uL (ref 4.20–5.70)
RDW: 12.6 % (ref 11.1–15.7)
WBC: 3.7 10*3/uL — AB (ref 4.0–10.0)

## 2016-01-30 LAB — CEA (IN HOUSE-CHCC): CEA (CHCC-In House): 39.58 ng/mL — ABNORMAL HIGH (ref 0.00–5.00)

## 2016-01-30 MED ORDER — DIPHENHYDRAMINE HCL 50 MG/ML IJ SOLN
50.0000 mg | Freq: Once | INTRAMUSCULAR | Status: AC
  Administered 2016-01-30: 50 mg via INTRAVENOUS

## 2016-01-30 MED ORDER — PALONOSETRON HCL INJECTION 0.25 MG/5ML
0.2500 mg | Freq: Once | INTRAVENOUS | Status: AC
  Administered 2016-01-30: 0.25 mg via INTRAVENOUS

## 2016-01-30 MED ORDER — HEPARIN SOD (PORK) LOCK FLUSH 100 UNIT/ML IV SOLN
500.0000 [IU] | Freq: Once | INTRAVENOUS | Status: AC | PRN
  Administered 2016-01-30: 500 [IU]
  Filled 2016-01-30: qty 5

## 2016-01-30 MED ORDER — SODIUM CHLORIDE 0.9 % IV SOLN
10.0000 mg | Freq: Once | INTRAVENOUS | Status: AC
  Administered 2016-01-30: 10 mg via INTRAVENOUS
  Filled 2016-01-30: qty 1

## 2016-01-30 MED ORDER — PALONOSETRON HCL INJECTION 0.25 MG/5ML
INTRAVENOUS | Status: AC
  Filled 2016-01-30: qty 5

## 2016-01-30 MED ORDER — SODIUM CHLORIDE 0.9 % IV SOLN
Freq: Once | INTRAVENOUS | Status: AC
  Administered 2016-01-30: 11:00:00 via INTRAVENOUS

## 2016-01-30 MED ORDER — CETUXIMAB CHEMO IV INJECTION 200 MG/100ML
500.0000 mg/m2 | Freq: Once | INTRAVENOUS | Status: AC
  Administered 2016-01-30: 1000 mg via INTRAVENOUS
  Filled 2016-01-30: qty 500

## 2016-01-30 MED ORDER — DIPHENHYDRAMINE HCL 50 MG/ML IJ SOLN
INTRAMUSCULAR | Status: AC
Start: 2016-01-30 — End: 2016-01-30
  Filled 2016-01-30: qty 1

## 2016-01-30 MED ORDER — SODIUM CHLORIDE 0.9% FLUSH
10.0000 mL | INTRAVENOUS | Status: DC | PRN
  Administered 2016-01-30: 10 mL
  Filled 2016-01-30: qty 10

## 2016-01-30 NOTE — Patient Instructions (Signed)
Cetuximab injection What is this medicine? CETUXIMAB (se TUX i mab) is a monoclonal antibody. It is used to treat colorectal cancer and head and neck cancer. This medicine may be used for other purposes; ask your health care provider or pharmacist if you have questions. What should I tell my health care provider before I take this medicine? They need to know if you have any of these conditions: -heart disease -history of irregular heartbeat -history of low levels of calcium, magnesium, or potassium in the blood -lung or breathing disease, like asthma -an unusual or allergic reaction to cetuximab, other medicines, foods, dyes, or preservatives -pregnant or trying to get pregnant -breast-feeding How should I use this medicine? This drug is given as an infusion into a vein. It is administered in a hospital or clinic by a specially trained health care professional. Talk to your pediatrician regarding the use of this medicine in children. Special care may be needed. Overdosage: If you think you have taken too much of this medicine contact a poison control center or emergency room at once. NOTE: This medicine is only for you. Do not share this medicine with others. What if I miss a dose? It is important not to miss your dose. Call your doctor or health care professional if you are unable to keep an appointment. What may interact with this medicine? Interactions are not expected. This list may not describe all possible interactions. Give your health care provider a list of all the medicines, herbs, non-prescription drugs, or dietary supplements you use. Also tell them if you smoke, drink alcohol, or use illegal drugs. Some items may interact with your medicine. What should I watch for while using this medicine? Visit your doctor or health care professional for regular checks on your progress. This drug may make you feel generally unwell. This is not uncommon, as chemotherapy can affect healthy cells  as well as cancer cells. Report any side effects. Continue your course of treatment even though you feel ill unless your doctor tells you to stop. This medicine can make you more sensitive to the sun. Keep out of the sun while taking this medicine and for 2 months after the last dose. If you cannot avoid being in the sun, wear protective clothing and use sunscreen. Do not use sun lamps or tanning beds/booths. You may need blood work done while you are taking this medicine. In some cases, you may be given additional medicines to help with side effects. Follow all directions for their use. Call your doctor or health care professional for advice if you get a fever, chills or sore throat, or other symptoms of a cold or flu. Do not treat yourself. This drug decreases your body's ability to fight infections. Try to avoid being around people who are sick. Avoid taking products that contain aspirin, acetaminophen, ibuprofen, naproxen, or ketoprofen unless instructed by your doctor. These medicines may hide a fever. Do not become pregnant while taking this medicine. Women should inform their doctor if they wish to become pregnant or think they might be pregnant. There is a potential for serious side effects to an unborn child. Use adequate birth control methods. Avoid pregnancy for at least 6 months after your last dose. Talk to your health care professional or pharmacist for more information. Do not breast-feed an infant while taking this medicine or during the 2 months after your last dose. What side effects may I notice from receiving this medicine? Side effects that you should report to   your doctor or health care professional as soon as possible: -allergic reactions like skin rash, itching or hives, swelling of the face, lips, or tongue -breathing problems -changes in vision -fast, irregular heartbeat -feeling faint or lightheaded, falls -fever, chills -mouth sores -redness, blistering, peeling or  loosening of the skin, including inside the mouth -trouble passing urine or change in the amount of urine -unusually weak or tired Side effects that usually do not require medical attention (report to your doctor or health care professional if they continue or are bothersome): -changes in skin like acne, cracks, skin dryness -constipation -diarrhea -headache -nail changes -nausea, vomiting -stomach upset -weight loss This list may not describe all possible side effects. Call your doctor for medical advice about side effects. You may report side effects to FDA at 1-800-FDA-1088. Where should I keep my medicine? This drug is given in a hospital or clinic and will not be stored at home. NOTE: This sheet is a summary. It may not cover all possible information. If you have questions about this medicine, talk to your doctor, pharmacist, or health care provider.    2016, [employer]. (2014-07-27 22:27:08)  

## 2016-01-31 LAB — CEA: CEA: 39.8 ng/mL — ABNORMAL HIGH (ref 0.0–4.7)

## 2016-02-08 ENCOUNTER — Other Ambulatory Visit: Payer: Self-pay | Admitting: Nurse Practitioner

## 2016-02-08 DIAGNOSIS — C9110 Chronic lymphocytic leukemia of B-cell type not having achieved remission: Secondary | ICD-10-CM

## 2016-02-08 DIAGNOSIS — C787 Secondary malignant neoplasm of liver and intrahepatic bile duct: Principal | ICD-10-CM

## 2016-02-08 DIAGNOSIS — C189 Malignant neoplasm of colon, unspecified: Secondary | ICD-10-CM

## 2016-02-08 MED ORDER — CAPECITABINE 500 MG PO TABS: 1500.0000 mg | ORAL_TABLET | Freq: Two times a day (BID) | ORAL | 4 refills | Status: DC

## 2016-02-09 ENCOUNTER — Other Ambulatory Visit: Payer: Self-pay | Admitting: Hematology & Oncology

## 2016-02-09 DIAGNOSIS — C9110 Chronic lymphocytic leukemia of B-cell type not having achieved remission: Secondary | ICD-10-CM

## 2016-02-09 DIAGNOSIS — C787 Secondary malignant neoplasm of liver and intrahepatic bile duct: Principal | ICD-10-CM

## 2016-02-09 DIAGNOSIS — C189 Malignant neoplasm of colon, unspecified: Secondary | ICD-10-CM

## 2016-02-26 ENCOUNTER — Other Ambulatory Visit: Payer: Self-pay | Admitting: *Deleted

## 2016-02-26 DIAGNOSIS — C787 Secondary malignant neoplasm of liver and intrahepatic bile duct: Principal | ICD-10-CM

## 2016-02-26 DIAGNOSIS — C189 Malignant neoplasm of colon, unspecified: Secondary | ICD-10-CM

## 2016-02-27 ENCOUNTER — Other Ambulatory Visit (HOSPITAL_BASED_OUTPATIENT_CLINIC_OR_DEPARTMENT_OTHER): Payer: BLUE CROSS/BLUE SHIELD

## 2016-02-27 ENCOUNTER — Other Ambulatory Visit: Payer: Self-pay | Admitting: *Deleted

## 2016-02-27 ENCOUNTER — Ambulatory Visit (HOSPITAL_BASED_OUTPATIENT_CLINIC_OR_DEPARTMENT_OTHER): Payer: BLUE CROSS/BLUE SHIELD | Admitting: Hematology & Oncology

## 2016-02-27 ENCOUNTER — Ambulatory Visit (HOSPITAL_BASED_OUTPATIENT_CLINIC_OR_DEPARTMENT_OTHER): Payer: Medicare Other

## 2016-02-27 VITALS — BP 155/80 | HR 75 | Temp 98.2°F | Resp 18 | Wt 197.1 lb

## 2016-02-27 DIAGNOSIS — C189 Malignant neoplasm of colon, unspecified: Secondary | ICD-10-CM

## 2016-02-27 DIAGNOSIS — Z5112 Encounter for antineoplastic immunotherapy: Secondary | ICD-10-CM | POA: Diagnosis not present

## 2016-02-27 DIAGNOSIS — C787 Secondary malignant neoplasm of liver and intrahepatic bile duct: Secondary | ICD-10-CM

## 2016-02-27 DIAGNOSIS — C799 Secondary malignant neoplasm of unspecified site: Secondary | ICD-10-CM | POA: Diagnosis not present

## 2016-02-27 DIAGNOSIS — I10 Essential (primary) hypertension: Secondary | ICD-10-CM

## 2016-02-27 DIAGNOSIS — R21 Rash and other nonspecific skin eruption: Secondary | ICD-10-CM | POA: Diagnosis not present

## 2016-02-27 DIAGNOSIS — Z23 Encounter for immunization: Secondary | ICD-10-CM | POA: Diagnosis not present

## 2016-02-27 DIAGNOSIS — R112 Nausea with vomiting, unspecified: Secondary | ICD-10-CM

## 2016-02-27 DIAGNOSIS — C9110 Chronic lymphocytic leukemia of B-cell type not having achieved remission: Secondary | ICD-10-CM

## 2016-02-27 LAB — CBC WITH DIFFERENTIAL (CANCER CENTER ONLY)
BASO#: 0.1 10e3/uL (ref 0.0–0.2)
BASO%: 1.6 % (ref 0.0–2.0)
EOS%: 5 % (ref 0.0–7.0)
Eosinophils Absolute: 0.3 10e3/uL (ref 0.0–0.5)
HCT: 45.3 % (ref 38.7–49.9)
HGB: 16 g/dL (ref 13.0–17.1)
LYMPH#: 1.2 10e3/uL (ref 0.9–3.3)
LYMPH%: 23.2 % (ref 14.0–48.0)
MCH: 32.6 pg (ref 28.0–33.4)
MCHC: 35.3 g/dL (ref 32.0–35.9)
MCV: 92 fL (ref 82–98)
MONO#: 0.6 10e3/uL (ref 0.1–0.9)
MONO%: 12.8 % (ref 0.0–13.0)
NEUT#: 2.9 10e3/uL (ref 1.5–6.5)
NEUT%: 57.4 % (ref 40.0–80.0)
Platelets: 153 10e3/uL (ref 145–400)
RBC: 4.91 10e6/uL (ref 4.20–5.70)
RDW: 13.9 % (ref 11.1–15.7)
WBC: 5 10e3/uL (ref 4.0–10.0)

## 2016-02-27 LAB — CMP (CANCER CENTER ONLY)
ALT(SGPT): 33 U/L (ref 10–47)
AST: 32 U/L (ref 11–38)
Albumin: 3.6 g/dL (ref 3.3–5.5)
Alkaline Phosphatase: 89 U/L — ABNORMAL HIGH (ref 26–84)
BUN, Bld: 11 mg/dL (ref 7–22)
CO2: 28 meq/L (ref 18–33)
Calcium: 9 mg/dL (ref 8.0–10.3)
Chloride: 105 meq/L (ref 98–108)
Creat: 0.9 mg/dL (ref 0.6–1.2)
Glucose, Bld: 95 mg/dL (ref 73–118)
Potassium: 3.6 meq/L (ref 3.3–4.7)
Sodium: 138 meq/L (ref 128–145)
Total Bilirubin: 1 mg/dL (ref 0.20–1.60)
Total Protein: 6.9 g/dL (ref 6.4–8.1)

## 2016-02-27 LAB — CEA (IN HOUSE-CHCC): CEA (CHCC-In House): 44.57 ng/mL — ABNORMAL HIGH (ref 0.00–5.00)

## 2016-02-27 MED ORDER — DIPHENHYDRAMINE HCL 50 MG/ML IJ SOLN
INTRAMUSCULAR | Status: AC
  Filled 2016-02-27: qty 1

## 2016-02-27 MED ORDER — DIPHENHYDRAMINE HCL 50 MG/ML IJ SOLN
50.0000 mg | Freq: Once | INTRAMUSCULAR | Status: AC
  Administered 2016-02-27: 50 mg via INTRAVENOUS

## 2016-02-27 MED ORDER — SODIUM CHLORIDE 0.9% FLUSH
10.0000 mL | INTRAVENOUS | Status: DC | PRN
  Administered 2016-02-27: 10 mL
  Filled 2016-02-27: qty 10

## 2016-02-27 MED ORDER — CAPECITABINE 500 MG PO TABS: ORAL_TABLET | ORAL | 3 refills | Status: DC

## 2016-02-27 MED ORDER — AMLODIPINE BESY-BENAZEPRIL HCL 5-20 MG PO CAPS
1.0000 | ORAL_CAPSULE | Freq: Every day | ORAL | 3 refills | Status: DC
Start: 2016-02-27 — End: 2017-01-02

## 2016-02-27 MED ORDER — OXYCODONE-ACETAMINOPHEN 5-325 MG PO TABS: 1.0000 | ORAL_TABLET | Freq: Three times a day (TID) | ORAL | 0 refills | Status: DC | PRN

## 2016-02-27 MED ORDER — HEPARIN SOD (PORK) LOCK FLUSH 100 UNIT/ML IV SOLN
500.0000 [IU] | Freq: Once | INTRAVENOUS | Status: AC | PRN
  Administered 2016-02-27: 500 [IU]
  Filled 2016-02-27: qty 5

## 2016-02-27 MED ORDER — CETUXIMAB CHEMO IV INJECTION 200 MG/100ML
500.0000 mg/m2 | Freq: Once | INTRAVENOUS | Status: AC
  Administered 2016-02-27: 1000 mg via INTRAVENOUS
  Filled 2016-02-27: qty 200

## 2016-02-27 MED ORDER — INFLUENZA VAC SPLIT QUAD 0.5 ML IM SUSY
0.5000 mL | PREFILLED_SYRINGE | Freq: Once | INTRAMUSCULAR | Status: AC
  Administered 2016-02-27: 0.5 mL via INTRAMUSCULAR
  Filled 2016-02-27: qty 0.5

## 2016-02-27 MED ORDER — SODIUM CHLORIDE 0.9 % IV SOLN
Freq: Once | INTRAVENOUS | Status: AC
  Administered 2016-02-27: 12:00:00 via INTRAVENOUS

## 2016-02-27 NOTE — Patient Instructions (Signed)
Influenza Virus Vaccine injection What is this medicine? INFLUENZA VIRUS VACCINE (in floo EN zuh VAHY ruhs vak SEEN) helps to reduce the risk of getting influenza also known as the flu. The vaccine only helps protect you against some strains of the flu. This medicine may be used for other purposes; ask your health care provider or pharmacist if you have questions. What should I tell my health care provider before I take this medicine? They need to know if you have any of these conditions: -bleeding disorder like hemophilia -fever or infection -Guillain-Barre syndrome or other neurological problems -immune system problems -infection with the human immunodeficiency virus (HIV) or AIDS -low blood platelet counts -multiple sclerosis -an unusual or allergic reaction to influenza virus vaccine, latex, other medicines, foods, dyes, or preservatives. Different brands of vaccines contain different allergens. Some may contain latex or eggs. Talk to your doctor about your allergies to make sure that you get the right vaccine. -pregnant or trying to get pregnant -breast-feeding How should I use this medicine? This vaccine is for injection into a muscle or under the skin. It is given by a health care professional. A copy of Vaccine Information Statements will be given before each vaccination. Read this sheet carefully each time. The sheet may change frequently. Talk to your healthcare provider to see which vaccines are right for you. Some vaccines should not be used in all age groups. Overdosage: If you think you have taken too much of this medicine contact a poison control center or emergency room at once. NOTE: This medicine is only for you. Do not share this medicine with others. What if I miss a dose? This does not apply. What may interact with this medicine? -chemotherapy or radiation therapy -medicines that lower your immune system like etanercept, anakinra, infliximab, and adalimumab -medicines  that treat or prevent blood clots like warfarin -phenytoin -steroid medicines like prednisone or cortisone -theophylline -vaccines This list may not describe all possible interactions. Give your health care provider a list of all the medicines, herbs, non-prescription drugs, or dietary supplements you use. Also tell them if you smoke, drink alcohol, or use illegal drugs. Some items may interact with your medicine. What should I watch for while using this medicine? Report any side effects that do not go away within 3 days to your doctor or health care professional. Call your health care provider if any unusual symptoms occur within 6 weeks of receiving this vaccine. You may still catch the flu, but the illness is not usually as bad. You cannot get the flu from the vaccine. The vaccine will not protect against colds or other illnesses that may cause fever. The vaccine is needed every year. What side effects may I notice from receiving this medicine? Side effects that you should report to your doctor or health care professional as soon as possible: -allergic reactions like skin rash, itching or hives, swelling of the face, lips, or tongue Side effects that usually do not require medical attention (report to your doctor or health care professional if they continue or are bothersome): -fever -headache -muscle aches and pains -pain, tenderness, redness, or swelling at the injection site -tiredness This list may not describe all possible side effects. Call your doctor for medical advice about side effects. You may report side effects to FDA at 1-800-FDA-1088. Where should I keep my medicine? The vaccine will be given by a health care professional in a clinic, pharmacy, doctor's office, or other health care setting. You will not   be given vaccine doses to store at home. NOTE: This sheet is a summary. It may not cover all possible information. If you have questions about this medicine, talk to your  doctor, pharmacist, or health care provider.    2016, [employer]. (2014-12-09 10:07:28) Cetuximab injection What is this medicine? CETUXIMAB (se TUX i mab) is a monoclonal antibody. It is used to treat colorectal cancer and head and neck cancer. This medicine may be used for other purposes; ask your health care provider or pharmacist if you have questions. What should I tell my health care provider before I take this medicine? They need to know if you have any of these conditions: -heart disease -history of irregular heartbeat -history of low levels of calcium, magnesium, or potassium in the blood -lung or breathing disease, like asthma -an unusual or allergic reaction to cetuximab, other medicines, foods, dyes, or preservatives -pregnant or trying to get pregnant -breast-feeding How should I use this medicine? This drug is given as an infusion into a vein. It is administered in a hospital or clinic by a specially trained health care professional. Talk to your pediatrician regarding the use of this medicine in children. Special care may be needed. Overdosage: If you think you have taken too much of this medicine contact a poison control center or emergency room at once. NOTE: This medicine is only for you. Do not share this medicine with others. What if I miss a dose? It is important not to miss your dose. Call your doctor or health care professional if you are unable to keep an appointment. What may interact with this medicine? Interactions are not expected. This list may not describe all possible interactions. Give your health care provider a list of all the medicines, herbs, non-prescription drugs, or dietary supplements you use. Also tell them if you smoke, drink alcohol, or use illegal drugs. Some items may interact with your medicine. What should I watch for while using this medicine? Visit your doctor or health care professional for regular checks on your progress. This  drug may make you feel generally unwell. This is not uncommon, as chemotherapy can affect healthy cells as well as cancer cells. Report any side effects. Continue your course of treatment even though you feel ill unless your doctor tells you to stop. This medicine can make you more sensitive to the sun. Keep out of the sun while taking this medicine and for 2 months after the last dose. If you cannot avoid being in the sun, wear protective clothing and use sunscreen. Do not use sun lamps or tanning beds/booths. You may need blood work done while you are taking this medicine. In some cases, you may be given additional medicines to help with side effects. Follow all directions for their use. Call your doctor or health care professional for advice if you get a fever, chills or sore throat, or other symptoms of a cold or flu. Do not treat yourself. This drug decreases your body's ability to fight infections. Try to avoid being around people who are sick. Avoid taking products that contain aspirin, acetaminophen, ibuprofen, naproxen, or ketoprofen unless instructed by your doctor. These medicines may hide a fever. Do not become pregnant while taking this medicine. Women should inform their doctor if they wish to become pregnant or think they might be pregnant. There is a potential for serious side effects to an unborn child. Use adequate birth control methods. Avoid pregnancy for at least 6 months after your last dose.  Talk to your health care professional or pharmacist for more information. Do not breast-feed an infant while taking this medicine or during the 2 months after your last dose. What side effects may I notice from receiving this medicine? Side effects that you should report to your doctor or health care professional as soon as possible: -allergic reactions like skin rash, itching or hives, swelling of the face, lips, or tongue -breathing problems -changes in vision -fast, irregular  heartbeat -feeling faint or lightheaded, falls -fever, chills -mouth sores -redness, blistering, peeling or loosening of the skin, including inside the mouth -trouble passing urine or change in the amount of urine -unusually weak or tired Side effects that usually do not require medical attention (report to your doctor or health care professional if they continue or are bothersome): -changes in skin like acne, cracks, skin dryness -constipation -diarrhea -headache -nail changes -nausea, vomiting -stomach upset -weight loss This list may not describe all possible side effects. Call your doctor for medical advice about side effects. You may report side effects to FDA at 1-800-FDA-1088. Where should I keep my medicine? This drug is given in a hospital or clinic and will not be stored at home. NOTE: This sheet is a summary. It may not cover all possible information. If you have questions about this medicine, talk to your doctor, pharmacist, or health care provider.    2016, [employer]. (2014-07-27 22:27:08)

## 2016-02-27 NOTE — Progress Notes (Signed)
Hematology and Oncology Follow Up Visit  Ryan Maynard [id number] 1950/10/26 65 y.o. 02/27/2016   Principle Diagnosis:  Metastatic colon cancer - K-RAS wild-type - progressive  Current Therapy:   XELOX/Avastin s/p cycle 5 XELIRI/Erbitux - s/p c#6  (28 day cycles) - omit the Irinotecan Status post RFA of liver metastases    Interim History:  Ryan Maynard is here today for follow-up. He looks quite good. He feels pretty good. He is doing some yard work. He is able to do more now that the irinotecan has been discontinued.  The Erbitux is still causing a lot of a skin rash. However, he seems to be tolerating this pretty well. He is on the "cocktail" to help with the skin rash.  His last CEA was up slightly. It was 39. We will have to see what the results are today.  He's had no abdominal pain. He's had no change in bowel or bladder habits. He may have some loose stools.  He's had no cough.  He's had no leg swelling.   Overall, his performance status is ECOG 1 . Medications: (   Medication List       Accurate as of 02/27/16 11:05 AM. Always use your most recent med list.          amLODipine-benazepril 5-20 MG capsule Commonly known as:  LOTREL Take 1 capsule by mouth daily.   baclofen 10 MG tablet Commonly known as:  LIORESAL Take 1 pill if needed every 8 hrs for hiccups   capecitabine 500 MG tablet Commonly known as:  XELODA TAKE 3 TABLETS (1500 MG) BY MOUTH TWICE A DAY AFTER A MEAL FOR 14 DAYS ON, THEN 7 DAYS OFF.   Coenzyme Q10 10 MG capsule Take 10 mg by mouth daily.   dexamethasone 4 MG tablet Commonly known as:  DECADRON Take 2 tablets (8 mg total) by mouth daily. Start the day after chemo for 2 days.   diclofenac sodium 1 % Gel Commonly known as:  VOLTAREN Apply 2 g topically 4 (four) times daily.   doxycycline 100 MG tablet Commonly known as:  VIBRA-TABS Take 1 tablet (100 mg total) by mouth 2 (two) times daily.   gabapentin 100 MG capsule Commonly  known as:  NEURONTIN Take 2 capsules (200 mg total) by mouth 3 (three) times daily. Pt okay to take only once a day   ibuprofen 200 MG tablet Commonly known as:  ADVIL,MOTRIN Take 200-400 mg by mouth every 6 (six) hours as needed.   lidocaine-prilocaine cream Commonly known as:  EMLA Apply 1 application topically as needed. Apply quarter sized amount to portacath site 1-2 hours prior to chemotherapy appt.  Cover with saran wrap.   loperamide 2 MG tablet Commonly known as:  IMODIUM A-D Take 2 tablets (4 mg total) by mouth 4 (four) times daily as needed. Take 2 at diarrhea onset , then 1 every 2hr until 12hrs with no BM. May take 2 every 4hrs at night. If diarrhea recurs repeat.   LORazepam 1 MG tablet Commonly known as:  ATIVAN Take 1 tablet (1 mg total) by mouth every 6 (six) hours as needed (NAUSEA).   oxyCODONE-acetaminophen 5-325 MG tablet Commonly known as:  ROXICET Take 1-2 tablets by mouth every 8 (eight) hours as needed for severe pain.   oxymetazoline 0.05 % nasal spray Commonly known as:  AFRIN Place 1 spray into both nostrils 2 (two) times daily as needed for congestion.   Pancrelipase (Lip-Prot-Amyl) 24000-76000 units Cpep Take 1 capsule (  24,000 Units total) by mouth 3 (three) times daily before meals.   prochlorperazine 10 MG tablet Commonly known as:  COMPAZINE Take 1 tablet (10 mg total) by mouth every 6 (six) hours as needed (NAUSEA).       Allergies:  Allergies  Allergen Reactions  . Bactrim [Sulfamethoxazole-Trimethoprim] Diarrhea    Has taken 2 different times and both times frequent diarrhea  . Lyrica [Pregabalin]     "thought I was going to die"  . Zofran [Ondansetron Hcl] Other (See Comments)    Hiccups    Past Medical History, Surgical history, Social history, and Family History were reviewed and updated.  Review of Systems: All other 10 point review of systems is negative.   Physical Exam:  weight is 197 lb 1.9 oz (89.4 kg). His oral  temperature is 98.2 F (36.8 C). His blood pressure is 155/80 (abnormal) and his pulse is 75. His respiration is 18 and oxygen saturation is 98%.   Wt Readings from Last 3 Encounters:  02/27/16 197 lb 1.9 oz (89.4 kg)  01/23/16 195 lb (88.5 kg)  01/02/16 199 lb (90.3 kg)    Well-developed and well-nourished white male in no obvious distress. Head and neck exam shows no ocular or oral lesions. There are no palpable cervical or supraclavicular lymph nodes. Lungs are clear. Cardiac exam regular rate and rhythm with no murmurs, rubs or bruits. Abdomen is soft. There may be some slight distention. Bowel sounds are present. He has no guarding or rebound tenderness. No obvious abdominal masses noted. He has no hepatomegaly. Back exam shows no tenderness over the spine, ribs or hips. Externally shows no clubbing, cyanosis or edema. There may be some slight swelling in the left knee. He has good range of motion of the knee. Skin exam shows no rashes, ecchymosis or petechia. Neurological exam is nonfocal.   Lab Results  Component Value Date   WBC 5.0 02/27/2016   HGB 16.0 02/27/2016   HCT 45.3 02/27/2016   MCV 92 02/27/2016   PLT 153 02/27/2016   No results found for: FERRITIN, IRON, TIBC, UIBC, IRONPCTSAT Lab Results  Component Value Date   RBC 4.91 02/27/2016   No results found for: KPAFRELGTCHN, LAMBDASER, KAPLAMBRATIO No results found for: IGGSERUM, IGA, IGMSERUM No results found for: Odetta Pink, SPEI   Chemistry      Component Value Date/Time   NA 138 02/27/2016 0950   NA 140 12/26/2015 1013   K 3.6 02/27/2016 0950   K 4.1 12/26/2015 1013   CL 105 02/27/2016 0950   CO2 28 02/27/2016 0950   CO2 24 12/26/2015 1013   BUN 11 02/27/2016 0950   BUN 16.8 12/26/2015 1013   CREATININE 0.9 02/27/2016 0950   CREATININE 0.8 12/26/2015 1013      Component Value Date/Time   CALCIUM 9.0 02/27/2016 0950   CALCIUM 9.3 12/26/2015 1013    ALKPHOS 89 (H) 02/27/2016 0950   ALKPHOS 109 12/26/2015 1013   AST 32 02/27/2016 0950   AST 28 12/26/2015 1013   ALT 33 02/27/2016 0950   ALT 30 12/26/2015 1013   BILITOT 1.00 02/27/2016 0950   BILITOT 0.77 12/26/2015 1013     Impression and Plan: Ryan Maynard is 65 year old gentleman with metastatic colon cancer.   He is looking quite good. His quality of life is good.  We will go ahead with his seventh cycle of treatment. We will see what his CEA level is.  Allergies 8 cycle  of treatment, we'll then repeat his scans and see how things are looking. However, his CEA typically is a good barometer for response.  I am just glad that his quality of life is doing better.   if Volanda Napoleon, MD 9/26/201711:05 AM

## 2016-02-28 LAB — CEA: CEA: 46.2 ng/mL — ABNORMAL HIGH (ref 0.0–4.7)

## 2016-03-22 IMAGING — XA IR US GUIDE VASC ACCESS RIGHT
7 series · 13 of 24 positions shown · non-contrast
Comparison: none

ADDENDUM:
The Y-90 dose administered to the liver was recalculated, and 43% of
the prescribed dose of 28 mCi was given in the right hepatic
arterial territory.
CLINICAL DATA: Metastatic colorectal carcinoma to the liver.
Recurrent metastatic disease in both right and left lobes. Status
post arteriography with embolization of the gastroduodenal artery,
right gastric/pancreaticoduodenal branch and cystic artery on
01/12/2015.

[Series 1: body 4 · 2 of 66 frames shown (1 of 3)]
[frame 10/66]
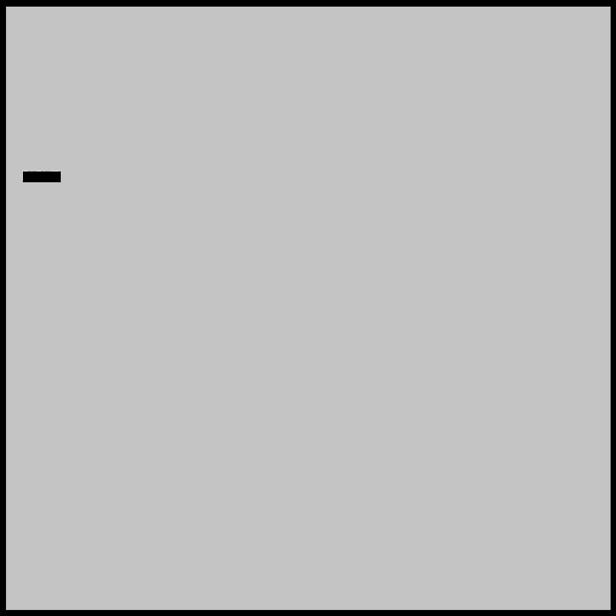
[frame 41/66]
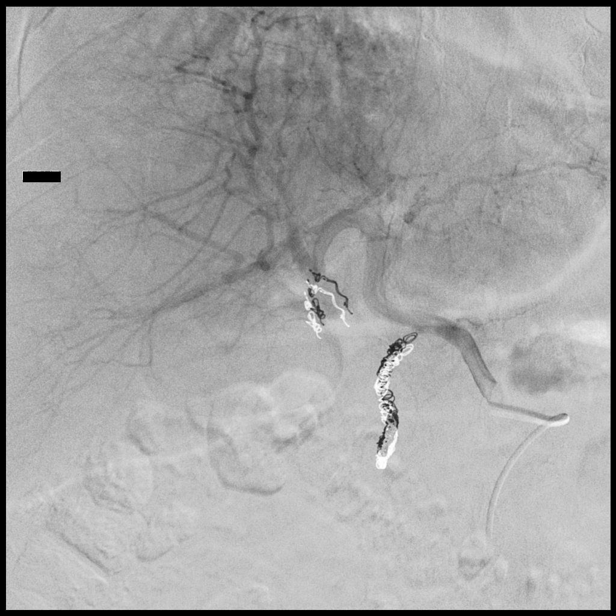

[Series 2: body 4 · 2 of 19 frames shown (2 of 3)]
[frame 3/19]
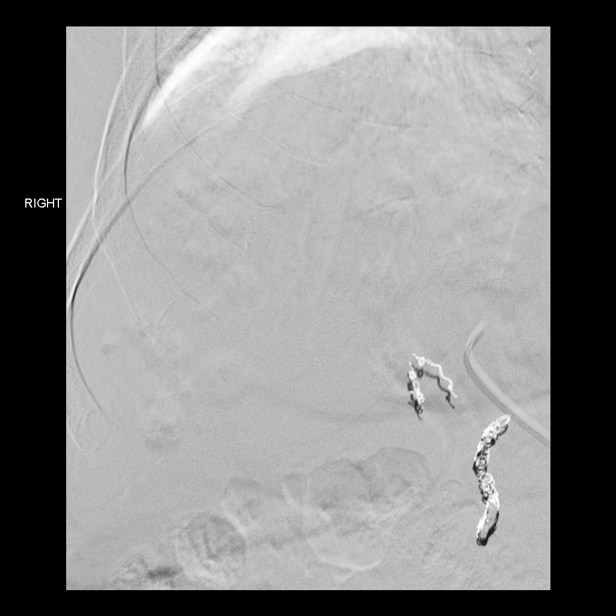
[frame 16/19]
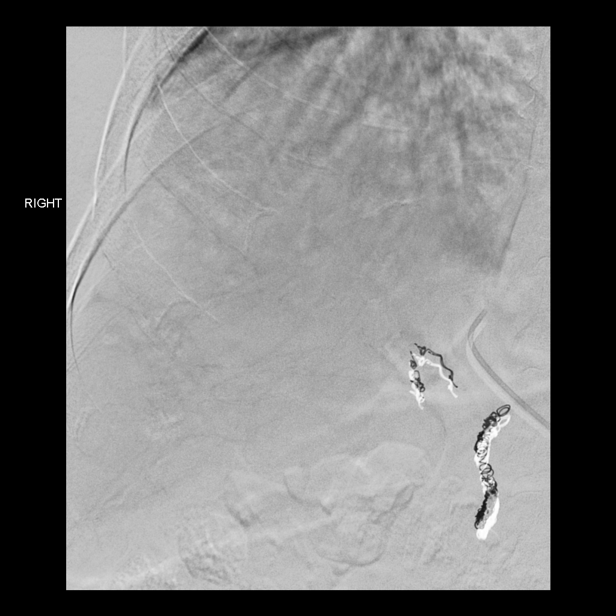

[Series 3: body 4 · 2 of 63 frames shown (3 of 3)]
[frame 10/63]
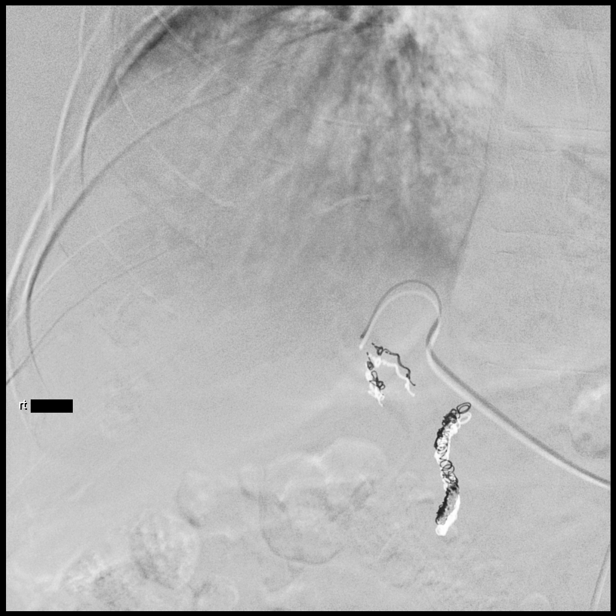
[frame 54/63]
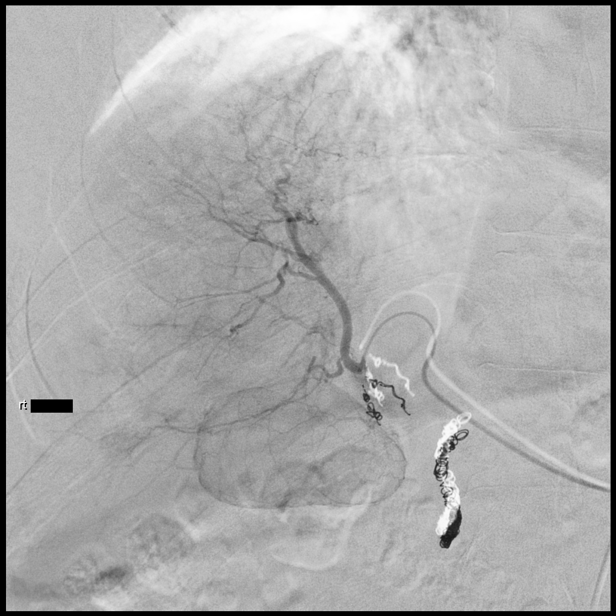

[Series 4: fl - angio · 2 of 23 frames shown (1 of 3)]
[frame 4/23]
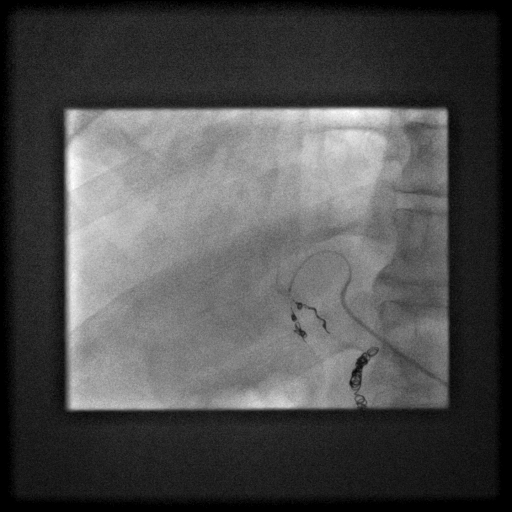
[frame 12/23]
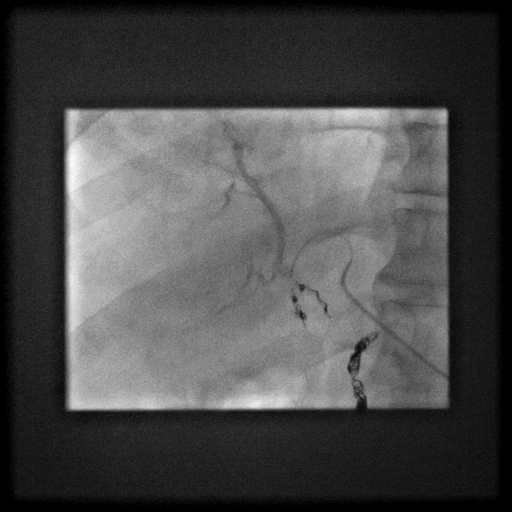

[Series 5: fl - angio · 2 of 48 frames shown (2 of 3)]
[frame 8/48]
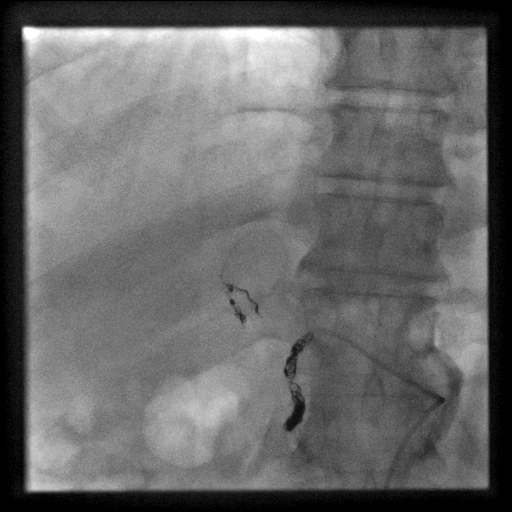
[frame 41/48]
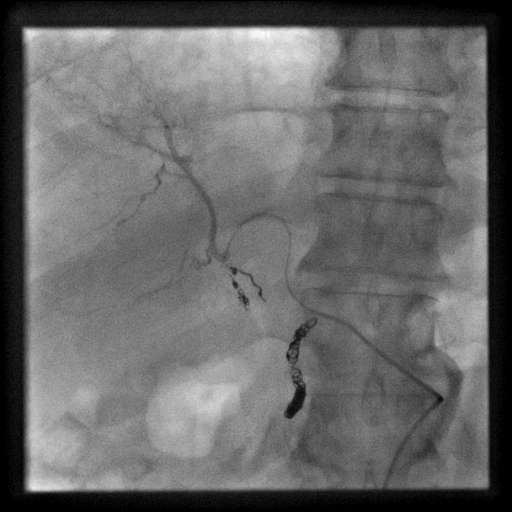

[Series 6: fl - angio · 2 of 56 frames shown (3 of 3)]
[frame 9/56]
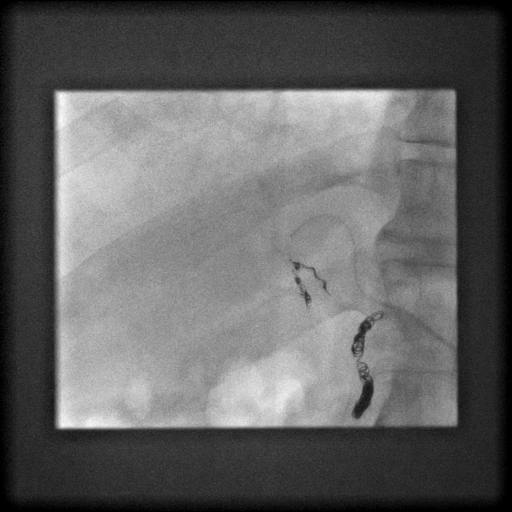
[frame 48/56]
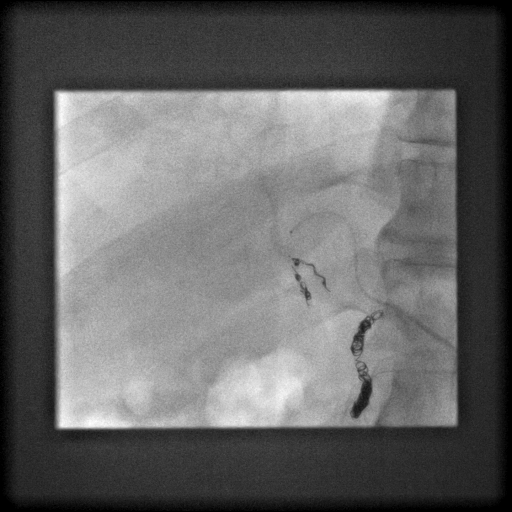

[Series 300: ir embo tumor organ ischemia infarct inc · 1 of 2 slices shown]
[im 2/2]
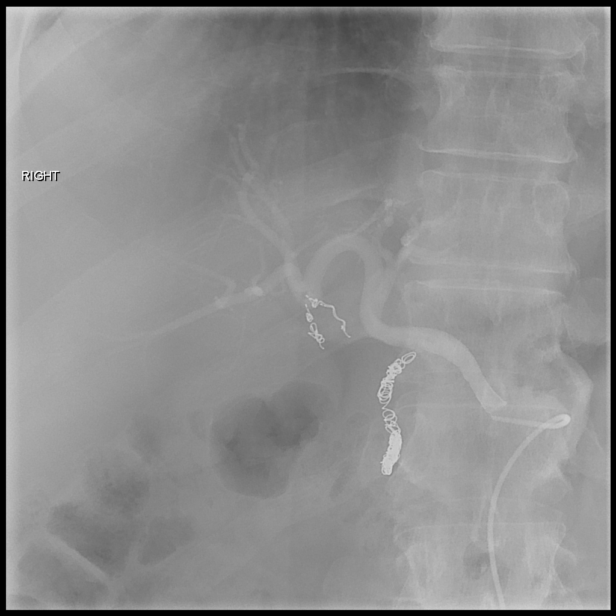

[13 of 24 positions shown; findings below may reference images not displayed]

EXAM:
1. ULTRASOUND GUIDANCE FOR VASCULAR ACCESS OF THE RIGHT COMMON
FEMORAL ARTERY
2. HEPATIC ARTERIOGRAPHY WITH SELECTIVE CATHETERIZATION OF THE
CELIAC AXIS AND ARTERIOGRAPHY AT THE LEVEL OF THE COMMON HEPATIC
ARTERY
3. SELECTIVE ARTERIOGRAPHY OF THE RIGHT HEPATIC ARTERY
4. TRANSCATHETER [id number] RADIOEMBOLIZATION OF THE RIGHT HEPATIC
ARTERY (INCOMPLETE)

FLUOROSCOPY TIME:  16 minutes and 54 seconds.

MEDICATIONS AND MEDICAL HISTORY:
5.0 mg IV Versed; 100 mcg IV Fentanyl.

Additional Medications: 3.375 grams IV Zosyn, 10 mg IV Decadron, 40
mg IV Protonix, 4 mg IV Zofran

Y-90 dose: Approximately 11.0 mCi of 28 mCi delivered

ANESTHESIA/SEDATION:
Moderate sedation time: 178 minutes minutes

CONTRAST:  100 ml [id number]

PROCEDURE:
The procedure, risks, benefits, and alternatives were explained to
the patient. Questions regarding the procedure were encouraged and
answered. The patient understands and consents to the procedure. A
time-out was performed prior to the procedure.

Ultrasound was used to confirm patency of the right common femoral
artery. The right groin was prepped with Betadine in a sterile
fashion, and a sterile drape was applied covering the operative
field. A sterile gown and sterile gloves were used for the
procedure. Local anesthesia was provided with 1% Lidocaine.
Ultrasound image documentation was performed.Access of the right
common femoral artery was performed under ultrasound guidance with a
micropuncture [DATE]-French sheath was placed. A 5-French Cobra catheter was advanced
and used to selectively catheterize the celiac axis. The catheter
was further advanced into the common hepatic artery and selective
arteriography performed. A microcatheter was then advanced through
the 5 French catheter. The microcatheter was further advanced into
the right hepatic artery. Selective arteriography was performed of
the right hepatic artery.

Radioembolization was begun with Pttrium-T7 SIR Spheres. Particles
were administered via a microcatheter utilizing a completely
enclosed system. Monitoring of antegrade flow was performed during
administration under fluoroscopy with use of contrast
intermittently. After administration of the first dose of particles,
the microcatheter was removed and discarded along with the attached
tubing and particle vial.

A second dose of Pttrium-T7 SIR Spheres was prepared in a completely
new administrations system. A new 5 French catheter was readvanced
an used to selectively catheterize the celiac axis. This catheter
was further advanced into the common hepatic artery. A new
microcatheter was then advanced into the right hepatic artery.
Radioembolization was then begun through this new infusions system.
The infusion was aborted prior to administration of the dose.

Femoral puncture site was assessed with oblique arteriography. The
sheath was re-prepped with Betadine. Arteriotomy closure was
performed with the Cordis Exoseal device.
FINDINGS: Initial arteriography shows durable occlusion of the gastroduodenal,
right gastric/gastroduodenal and cystic arteries after previous coil
embolization. No new gastric or duodenal supply is identified off of
the hepatic arteries.

Good antegrade flow was present by arteriography in the hepatic
arteries including the right hepatic artery. A micro catheter was
positioned in the right hepatic artery prior to infusion of
Pttrium-T7 microspheres. After partial infusion of 9 mCi of the
prescribed Y-90 dose of 28 mCi, further infusion could not be
completed due to high pressure in the administration system
resulting in back flow of fluid into the microsphere source vial.
Ultimately, this resulted and a small amount of leakage of fluid
from the septum of the mobile and administration had to be aborted.

There was enough Y-90 dose sent overnight to prepare a second full
dose of 28 mCi of particles. After this was performed, a new
infusion system was utilized as well as new catheters advanced into
the right hepatic artery. With attempted administration of
microspheres for a second time, there was again high pressure
encountered in the system and only 2 mCi of the particles could be
administered through the micro catheter. Based on the repeated
difficulty of administration, it is felt that failure to administer
the particles is due to high pressure and arterial resistance in the
hepatic arterial tree. This may be the result of previously treated
metastatic disease with prior imaging showing a multitude of densely
calcified treated metastatic lesions throughout the liver.

The second attempt at infusing Y-90 particles was aborted after
incomplete administration.

COMPLICATIONS:
None
IMPRESSION: Partial Y-90 treatment of the right lobe of the liver due to
unusually high arterial pressures and resistance encountered in the
hepatic arterial tree with administration. Only 11 mCi of Y-90
microspheres was administered. The calculated prescribed dose was 28
mCi. With attempt at infusing a second dose via a completely new
infusion system, the same problems were encountered . 9 mCi was
delivered during the first treatment and 2 mCi were delivered with
the second infusion system. Due to the unusual hepatic arterial
resistance, attempt at further treatment of the right lobe or left
lobe with SIR Spheres is not recommended.

## 2016-03-26 ENCOUNTER — Encounter: Payer: Self-pay | Admitting: Family

## 2016-03-26 ENCOUNTER — Other Ambulatory Visit (HOSPITAL_BASED_OUTPATIENT_CLINIC_OR_DEPARTMENT_OTHER): Payer: Medicare Other

## 2016-03-26 ENCOUNTER — Ambulatory Visit (HOSPITAL_BASED_OUTPATIENT_CLINIC_OR_DEPARTMENT_OTHER): Payer: Medicare Other | Admitting: Family

## 2016-03-26 ENCOUNTER — Ambulatory Visit (HOSPITAL_BASED_OUTPATIENT_CLINIC_OR_DEPARTMENT_OTHER): Payer: Medicare Other

## 2016-03-26 ENCOUNTER — Other Ambulatory Visit: Payer: Self-pay | Admitting: *Deleted

## 2016-03-26 VITALS — BP 155/83 | HR 84 | Temp 98.0°F | Resp 16

## 2016-03-26 DIAGNOSIS — G4700 Insomnia, unspecified: Secondary | ICD-10-CM | POA: Diagnosis not present

## 2016-03-26 DIAGNOSIS — C189 Malignant neoplasm of colon, unspecified: Secondary | ICD-10-CM

## 2016-03-26 DIAGNOSIS — Z5112 Encounter for antineoplastic immunotherapy: Secondary | ICD-10-CM

## 2016-03-26 DIAGNOSIS — R112 Nausea with vomiting, unspecified: Secondary | ICD-10-CM

## 2016-03-26 DIAGNOSIS — C787 Secondary malignant neoplasm of liver and intrahepatic bile duct: Principal | ICD-10-CM

## 2016-03-26 DIAGNOSIS — I10 Essential (primary) hypertension: Secondary | ICD-10-CM

## 2016-03-26 DIAGNOSIS — C9110 Chronic lymphocytic leukemia of B-cell type not having achieved remission: Secondary | ICD-10-CM

## 2016-03-26 LAB — CMP (CANCER CENTER ONLY)
ALBUMIN: 3.7 g/dL (ref 3.3–5.5)
ALT(SGPT): 29 U/L (ref 10–47)
AST: 28 U/L (ref 11–38)
Alkaline Phosphatase: 99 U/L — ABNORMAL HIGH (ref 26–84)
BUN, Bld: 11 mg/dL (ref 7–22)
CALCIUM: 9.1 mg/dL (ref 8.0–10.3)
CHLORIDE: 103 meq/L (ref 98–108)
CO2: 26 meq/L (ref 18–33)
Creat: 0.6 mg/dl (ref 0.6–1.2)
Glucose, Bld: 143 mg/dL — ABNORMAL HIGH (ref 73–118)
POTASSIUM: 3.7 meq/L (ref 3.3–4.7)
SODIUM: 137 meq/L (ref 128–145)
TOTAL PROTEIN: 7.2 g/dL (ref 6.4–8.1)
Total Bilirubin: 0.9 mg/dl (ref 0.20–1.60)

## 2016-03-26 LAB — CBC WITH DIFFERENTIAL (CANCER CENTER ONLY)
BASO#: 0 10*3/uL (ref 0.0–0.2)
BASO%: 0.8 % (ref 0.0–2.0)
EOS ABS: 0.2 10*3/uL (ref 0.0–0.5)
EOS%: 3.1 % (ref 0.0–7.0)
HEMATOCRIT: 46.4 % (ref 38.7–49.9)
HEMOGLOBIN: 16.5 g/dL (ref 13.0–17.1)
LYMPH#: 1.1 10*3/uL (ref 0.9–3.3)
LYMPH%: 21.8 % (ref 14.0–48.0)
MCH: 32.7 pg (ref 28.0–33.4)
MCHC: 35.6 g/dL (ref 32.0–35.9)
MCV: 92 fL (ref 82–98)
MONO#: 0.5 10*3/uL (ref 0.1–0.9)
MONO%: 9.7 % (ref 0.0–13.0)
NEUT%: 64.6 % (ref 40.0–80.0)
NEUTROS ABS: 3.3 10*3/uL (ref 1.5–6.5)
Platelets: 163 10*3/uL (ref 145–400)
RBC: 5.05 10*6/uL (ref 4.20–5.70)
RDW: 14.9 % (ref 11.1–15.7)
WBC: 5.1 10*3/uL (ref 4.0–10.0)

## 2016-03-26 LAB — CEA (IN HOUSE-CHCC): CEA (CHCC-IN HOUSE): 68.85 ng/mL — AB (ref 0.00–5.00)

## 2016-03-26 LAB — LACTATE DEHYDROGENASE: LDH: 199 U/L (ref 125–245)

## 2016-03-26 MED ORDER — CETUXIMAB CHEMO IV INJECTION 200 MG/100ML
500.0000 mg/m2 | Freq: Once | INTRAVENOUS | Status: AC
  Administered 2016-03-26: 1000 mg via INTRAVENOUS
  Filled 2016-03-26: qty 500

## 2016-03-26 MED ORDER — DIPHENHYDRAMINE HCL 50 MG/ML IJ SOLN
INTRAMUSCULAR | Status: AC
  Filled 2016-03-26: qty 1

## 2016-03-26 MED ORDER — SODIUM CHLORIDE 0.9% FLUSH
10.0000 mL | INTRAVENOUS | Status: DC | PRN
  Administered 2016-03-26: 10 mL
  Filled 2016-03-26: qty 10

## 2016-03-26 MED ORDER — HEPARIN SOD (PORK) LOCK FLUSH 100 UNIT/ML IV SOLN
500.0000 [IU] | Freq: Once | INTRAVENOUS | Status: AC | PRN
  Administered 2016-03-26: 500 [IU]
  Filled 2016-03-26: qty 5

## 2016-03-26 MED ORDER — CAPECITABINE 500 MG PO TABS: ORAL_TABLET | ORAL | 3 refills | Status: DC

## 2016-03-26 MED ORDER — SODIUM CHLORIDE 0.9 % IV SOLN
Freq: Once | INTRAVENOUS | Status: AC
  Administered 2016-03-26: 11:00:00 via INTRAVENOUS

## 2016-03-26 MED ORDER — TRAZODONE HCL 50 MG PO TABS: 50.0000 mg | ORAL_TABLET | Freq: Every evening | ORAL | 1 refills | Status: DC | PRN

## 2016-03-26 MED ORDER — DIPHENHYDRAMINE HCL 50 MG/ML IJ SOLN
50.0000 mg | Freq: Once | INTRAMUSCULAR | Status: AC
  Administered 2016-03-26: 50 mg via INTRAVENOUS

## 2016-03-26 NOTE — Patient Instructions (Signed)
Cetuximab injection What is this medicine? CETUXIMAB (se TUX i mab) is a monoclonal antibody. It is used to treat colorectal cancer and head and neck cancer. This medicine may be used for other purposes; ask your health care provider or pharmacist if you have questions. What should I tell my health care provider before I take this medicine? They need to know if you have any of these conditions: -heart disease -history of irregular heartbeat -history of low levels of calcium, magnesium, or potassium in the blood -lung or breathing disease, like asthma -an unusual or allergic reaction to cetuximab, other medicines, foods, dyes, or preservatives -pregnant or trying to get pregnant -breast-feeding How should I use this medicine? This drug is given as an infusion into a vein. It is administered in a hospital or clinic by a specially trained health care professional. Talk to your pediatrician regarding the use of this medicine in children. Special care may be needed. Overdosage: If you think you have taken too much of this medicine contact a poison control center or emergency room at once. NOTE: This medicine is only for you. Do not share this medicine with others. What if I miss a dose? It is important not to miss your dose. Call your doctor or health care professional if you are unable to keep an appointment. What may interact with this medicine? Interactions are not expected. This list may not describe all possible interactions. Give your health care provider a list of all the medicines, herbs, non-prescription drugs, or dietary supplements you use. Also tell them if you smoke, drink alcohol, or use illegal drugs. Some items may interact with your medicine. What should I watch for while using this medicine? Visit your doctor or health care professional for regular checks on your progress. This drug may make you feel generally unwell. This is not uncommon, as chemotherapy can affect healthy cells  as well as cancer cells. Report any side effects. Continue your course of treatment even though you feel ill unless your doctor tells you to stop. This medicine can make you more sensitive to the sun. Keep out of the sun while taking this medicine and for 2 months after the last dose. If you cannot avoid being in the sun, wear protective clothing and use sunscreen. Do not use sun lamps or tanning beds/booths. You may need blood work done while you are taking this medicine. In some cases, you may be given additional medicines to help with side effects. Follow all directions for their use. Call your doctor or health care professional for advice if you get a fever, chills or sore throat, or other symptoms of a cold or flu. Do not treat yourself. This drug decreases your body's ability to fight infections. Try to avoid being around people who are sick. Avoid taking products that contain aspirin, acetaminophen, ibuprofen, naproxen, or ketoprofen unless instructed by your doctor. These medicines may hide a fever. Do not become pregnant while taking this medicine. Women should inform their doctor if they wish to become pregnant or think they might be pregnant. There is a potential for serious side effects to an unborn child. Use adequate birth control methods. Avoid pregnancy for at least 6 months after your last dose. Talk to your health care professional or pharmacist for more information. Do not breast-feed an infant while taking this medicine or during the 2 months after your last dose. What side effects may I notice from receiving this medicine? Side effects that you should report to   your doctor or health care professional as soon as possible: -allergic reactions like skin rash, itching or hives, swelling of the face, lips, or tongue -breathing problems -changes in vision -fast, irregular heartbeat -feeling faint or lightheaded, falls -fever, chills -mouth sores -redness, blistering, peeling or  loosening of the skin, including inside the mouth -trouble passing urine or change in the amount of urine -unusually weak or tired Side effects that usually do not require medical attention (report to your doctor or health care professional if they continue or are bothersome): -changes in skin like acne, cracks, skin dryness -constipation -diarrhea -headache -nail changes -nausea, vomiting -stomach upset -weight loss This list may not describe all possible side effects. Call your doctor for medical advice about side effects. You may report side effects to FDA at 1-800-FDA-1088. Where should I keep my medicine? This drug is given in a hospital or clinic and will not be stored at home. NOTE: This sheet is a summary. It may not cover all possible information. If you have questions about this medicine, talk to your doctor, pharmacist, or health care provider.    2016, [employer]. (2014-07-27 22:27:08)  

## 2016-03-26 NOTE — Progress Notes (Signed)
Hematology and Oncology Follow Up Visit  Ryan Maynard [id number] 1951-03-12 65 y.o. 03/26/2016   Principle Diagnosis:  Metastatic colon cancer - K-RAS wild-type - progressive  Current Therapy:   XELOX/Avastin s/p cycle 5 XELIRI/Erbitux - s/p cycle 7 (28 day cycles) - omit the Irinotecan Status post RFA of liver metastases     Interim History:  Ryan Maynard is here today for follow-up and cycle 8 of treatment. He is doing fairly well but still complains of not being able to sleep and when he does fall asleep he has strange dreams. He denies having nightmares. He is fatigued. He would like to try something to help him sleep.  He is still staying busy doing yard work and other activities. He will take breaks to rest as needed.  He still has a bit of a red raised rash on his face but this continues to improve. No fever, chills, n/v, cough, dizziness, headache, vision changes, SOB, chest pain, palpitations, abdominal pain or changes in bowel or bladder habits.  No swelling, tenderness, numbness or tingling in his extremities. He has chronic back pain and spasms due to a bulging disc at L3-L4 He has maintained a good appetite and is staying well hydrated. His weight is stable.   Medications:    Medication List       Accurate as of 03/26/16  9:57 AM. Always use your most recent med list.          amLODipine-benazepril 5-20 MG capsule Commonly known as:  LOTREL Take 1 capsule by mouth daily.   baclofen 10 MG tablet Commonly known as:  LIORESAL Take 1 pill if needed every 8 hrs for hiccups   capecitabine 500 MG tablet Commonly known as:  XELODA TAKE 3 TABLETS (1500 MG) BY MOUTH TWICE A DAY AFTER A MEAL FOR 14 DAYS ON, THEN 7 DAYS OFF.   Coenzyme Q10 10 MG capsule Take 10 mg by mouth daily.   diclofenac sodium 1 % Gel Commonly known as:  VOLTAREN Apply 2 g topically 4 (four) times daily.   doxycycline 100 MG tablet Commonly known as:  VIBRA-TABS Take 1 tablet (100 mg total) by  mouth 2 (two) times daily.   gabapentin 100 MG capsule Commonly known as:  NEURONTIN Take 2 capsules (200 mg total) by mouth 3 (three) times daily. Pt okay to take only once a day   ibuprofen 200 MG tablet Commonly known as:  ADVIL,MOTRIN Take 200-400 mg by mouth every 6 (six) hours as needed.   lidocaine-prilocaine cream Commonly known as:  EMLA Apply 1 application topically as needed. Apply quarter sized amount to portacath site 1-2 hours prior to chemotherapy appt.  Cover with saran wrap.   LORazepam 1 MG tablet Commonly known as:  ATIVAN Take 1 tablet (1 mg total) by mouth every 6 (six) hours as needed (NAUSEA).   oxyCODONE-acetaminophen 5-325 MG tablet Commonly known as:  ROXICET Take 1-2 tablets by mouth every 8 (eight) hours as needed for severe pain.   oxymetazoline 0.05 % nasal spray Commonly known as:  AFRIN Place 1 spray into both nostrils 2 (two) times daily as needed for congestion.   Pancrelipase (Lip-Prot-Amyl) 24000-76000 units Cpep Take 1 capsule (24,000 Units total) by mouth 3 (three) times daily before meals.       Allergies:  Allergies  Allergen Reactions  . Bactrim [Sulfamethoxazole-Trimethoprim] Diarrhea    Has taken 2 different times and both times frequent diarrhea  . Lyrica [Pregabalin]     "thought I  was going to die"  . Zofran [Ondansetron Hcl] Other (See Comments)    Hiccups    Past Medical History, Surgical history, Social history, and Family History were reviewed and updated.  Review of Systems: All other 10 point review of systems is negative.   Physical Exam:  vitals were not taken for this visit.  Wt Readings from Last 3 Encounters:  02/27/16 197 lb 1.9 oz (89.4 kg)  01/23/16 195 lb (88.5 kg)  01/02/16 199 lb (90.3 kg)    Ocular: Sclerae unicteric, pupils equal, round and reactive to light Ear-nose-throat: Oropharynx clear, dentition fair Lymphatic: No cervical supraclavicular or axillary adenopathy Lungs no rales or  rhonchi, good excursion bilaterally Heart regular rate and rhythm, no murmur appreciated Abd soft, nontender, positive bowel sounds, no liver or spleen tip palpated on exam, no fluid wave  MSK no focal spinal tenderness, no joint edema Neuro: non-focal, well-oriented, appropriate affect Breasts: Deferred  Lab Results  Component Value Date   WBC 5.0 02/27/2016   HGB 16.0 02/27/2016   HCT 45.3 02/27/2016   MCV 92 02/27/2016   PLT 153 02/27/2016   No results found for: FERRITIN, IRON, TIBC, UIBC, IRONPCTSAT Lab Results  Component Value Date   RBC 4.91 02/27/2016   No results found for: KPAFRELGTCHN, LAMBDASER, KAPLAMBRATIO No results found for: IGGSERUM, IGA, IGMSERUM No results found for: Odetta Pink, SPEI   Chemistry      Component Value Date/Time   NA 138 02/27/2016 0950   NA 140 12/26/2015 1013   K 3.6 02/27/2016 0950   K 4.1 12/26/2015 1013   CL 105 02/27/2016 0950   CO2 28 02/27/2016 0950   CO2 24 12/26/2015 1013   BUN 11 02/27/2016 0950   BUN 16.8 12/26/2015 1013   CREATININE 0.9 02/27/2016 0950   CREATININE 0.8 12/26/2015 1013      Component Value Date/Time   CALCIUM 9.0 02/27/2016 0950   CALCIUM 9.3 12/26/2015 1013   ALKPHOS 89 (H) 02/27/2016 0950   ALKPHOS 109 12/26/2015 1013   AST 32 02/27/2016 0950   AST 28 12/26/2015 1013   ALT 33 02/27/2016 0950   ALT 30 12/26/2015 1013   BILITOT 1.00 02/27/2016 0950   BILITOT 0.77 12/26/2015 1013     Impression and Plan: Ryan Maynard is 65 yo gentleman with metastatic colon cancer. He continues to do well on treatment with Xeloda and Erbitux. His main issue at this time is his inability to sleep. This has been going on for a while and he is finally ready to try something to help. We will have him take Trazedone 50 mg PO QHS PRN. We discussed potential side effects to be on the look out for. He will call and let us know how it is working for him later this week.  His  CBC and CMP look good.  We will proceed with cycle 8 of Xeloda/Erbitux as planned. He does need a refill on his Xeloda. We will take care of this today.  We will repeat scans after this cycle of treatment in 3 weeks.  He has his current treatment and appointment schedule.  Both he and his wife know to contact our office with any questions or concerns. We can certainly see him sooner if need be.   Eliezer Bottom, NP 10/24/20179:57 AM

## 2016-04-16 ENCOUNTER — Other Ambulatory Visit (HOSPITAL_BASED_OUTPATIENT_CLINIC_OR_DEPARTMENT_OTHER): Payer: Medicare Other

## 2016-04-16 ENCOUNTER — Encounter (HOSPITAL_COMMUNITY): Payer: Self-pay

## 2016-04-16 ENCOUNTER — Ambulatory Visit (HOSPITAL_COMMUNITY)
Admission: RE | Admit: 2016-04-16 | Discharge: 2016-04-16 | Disposition: A | Discharge status: Home / Self Care | Payer: Medicare Other | Source: Ambulatory Visit | Attending: Family | Admitting: Family

## 2016-04-16 DIAGNOSIS — K4090 Unilateral inguinal hernia, without obstruction or gangrene, not specified as recurrent: Secondary | ICD-10-CM | POA: Diagnosis not present

## 2016-04-16 DIAGNOSIS — I2510 Atherosclerotic heart disease of native coronary artery without angina pectoris: Secondary | ICD-10-CM | POA: Diagnosis not present

## 2016-04-16 DIAGNOSIS — G4700 Insomnia, unspecified: Secondary | ICD-10-CM

## 2016-04-16 DIAGNOSIS — C787 Secondary malignant neoplasm of liver and intrahepatic bile duct: Secondary | ICD-10-CM | POA: Diagnosis not present

## 2016-04-16 DIAGNOSIS — C189 Malignant neoplasm of colon, unspecified: Secondary | ICD-10-CM | POA: Insufficient documentation

## 2016-04-16 DIAGNOSIS — I700 Atherosclerosis of aorta: Secondary | ICD-10-CM | POA: Insufficient documentation

## 2016-04-16 DIAGNOSIS — N4289 Other specified disorders of prostate: Secondary | ICD-10-CM | POA: Diagnosis not present

## 2016-04-16 DIAGNOSIS — M47814 Spondylosis without myelopathy or radiculopathy, thoracic region: Secondary | ICD-10-CM | POA: Insufficient documentation

## 2016-04-16 MED ORDER — IOPAMIDOL (ISOVUE-300) INJECTION 61%
100.0000 mL | Freq: Once | INTRAVENOUS | Status: AC | PRN
  Administered 2016-04-16: 100 mL via INTRAVENOUS

## 2016-04-17 ENCOUNTER — Telehealth: Payer: Self-pay | Admitting: *Deleted

## 2016-04-17 NOTE — Telephone Encounter (Addendum)
Patient aware of results  ----- Message from Volanda Napoleon, MD sent at 04/17/2016  6:44 AM EST ----- Call - cancer is shrinking again!!  This is great news!!!  Happy Thanksgiving!!!  pete

## 2016-04-23 ENCOUNTER — Ambulatory Visit (HOSPITAL_BASED_OUTPATIENT_CLINIC_OR_DEPARTMENT_OTHER): Payer: Medicare Other

## 2016-04-23 ENCOUNTER — Other Ambulatory Visit (HOSPITAL_BASED_OUTPATIENT_CLINIC_OR_DEPARTMENT_OTHER): Payer: Medicare Other

## 2016-04-23 ENCOUNTER — Ambulatory Visit (HOSPITAL_BASED_OUTPATIENT_CLINIC_OR_DEPARTMENT_OTHER): Payer: Medicare Other | Admitting: Hematology & Oncology

## 2016-04-23 VITALS — BP 133/79 | HR 75 | Temp 98.2°F | Resp 16 | Wt 200.0 lb

## 2016-04-23 DIAGNOSIS — C189 Malignant neoplasm of colon, unspecified: Secondary | ICD-10-CM | POA: Diagnosis not present

## 2016-04-23 DIAGNOSIS — C9110 Chronic lymphocytic leukemia of B-cell type not having achieved remission: Secondary | ICD-10-CM

## 2016-04-23 DIAGNOSIS — C787 Secondary malignant neoplasm of liver and intrahepatic bile duct: Secondary | ICD-10-CM

## 2016-04-23 DIAGNOSIS — R112 Nausea with vomiting, unspecified: Secondary | ICD-10-CM

## 2016-04-23 DIAGNOSIS — Z5112 Encounter for antineoplastic immunotherapy: Secondary | ICD-10-CM

## 2016-04-23 DIAGNOSIS — G4700 Insomnia, unspecified: Secondary | ICD-10-CM

## 2016-04-23 DIAGNOSIS — I10 Essential (primary) hypertension: Secondary | ICD-10-CM

## 2016-04-23 LAB — CBC WITH DIFFERENTIAL (CANCER CENTER ONLY)
BASO#: 0.1 10*3/uL (ref 0.0–0.2)
BASO%: 1.7 % (ref 0.0–2.0)
EOS%: 3.5 % (ref 0.0–7.0)
Eosinophils Absolute: 0.2 10*3/uL (ref 0.0–0.5)
HEMATOCRIT: 46.8 % (ref 38.7–49.9)
HEMOGLOBIN: 16.5 g/dL (ref 13.0–17.1)
LYMPH#: 1.3 10*3/uL (ref 0.9–3.3)
LYMPH%: 23.1 % (ref 14.0–48.0)
MCH: 33.1 pg (ref 28.0–33.4)
MCHC: 35.3 g/dL (ref 32.0–35.9)
MCV: 94 fL (ref 82–98)
MONO#: 0.7 10*3/uL (ref 0.1–0.9)
MONO%: 12 % (ref 0.0–13.0)
NEUT%: 59.7 % (ref 40.0–80.0)
NEUTROS ABS: 3.2 10*3/uL (ref 1.5–6.5)
Platelets: 190 10*3/uL (ref 145–400)
RBC: 4.98 10*6/uL (ref 4.20–5.70)
RDW: 14.9 % (ref 11.1–15.7)
WBC: 5.4 10*3/uL (ref 4.0–10.0)

## 2016-04-23 LAB — CMP (CANCER CENTER ONLY)
ALK PHOS: 100 U/L — AB (ref 26–84)
ALT: 31 U/L (ref 10–47)
AST: 33 U/L (ref 11–38)
Albumin: 3.7 g/dL (ref 3.3–5.5)
BILIRUBIN TOTAL: 1 mg/dL (ref 0.20–1.60)
BUN, Bld: 10 mg/dL (ref 7–22)
CALCIUM: 9.6 mg/dL (ref 8.0–10.3)
CO2: 28 mEq/L (ref 18–33)
Chloride: 105 mEq/L (ref 98–108)
Creat: 0.6 mg/dl (ref 0.6–1.2)
GLUCOSE: 102 mg/dL (ref 73–118)
Potassium: 3.7 mEq/L (ref 3.3–4.7)
Sodium: 138 mEq/L (ref 128–145)
Total Protein: 7 g/dL (ref 6.4–8.1)

## 2016-04-23 LAB — CEA (IN HOUSE-CHCC): CEA (CHCC-In House): 79.49 ng/mL — ABNORMAL HIGH (ref 0.00–5.00)

## 2016-04-23 LAB — LACTATE DEHYDROGENASE: LDH: 188 U/L (ref 125–245)

## 2016-04-23 MED ORDER — DIPHENHYDRAMINE HCL 50 MG/ML IJ SOLN
INTRAMUSCULAR | Status: AC
  Filled 2016-04-23: qty 1

## 2016-04-23 MED ORDER — HEPARIN SOD (PORK) LOCK FLUSH 100 UNIT/ML IV SOLN
500.0000 [IU] | Freq: Once | INTRAVENOUS | Status: AC | PRN
  Administered 2016-04-23: 500 [IU]
  Filled 2016-04-23: qty 5

## 2016-04-23 MED ORDER — TRAZODONE HCL 50 MG PO TABS: 50.0000 mg | ORAL_TABLET | Freq: Every evening | ORAL | 3 refills | Status: DC | PRN

## 2016-04-23 MED ORDER — SODIUM CHLORIDE 0.9 % IV SOLN
Freq: Once | INTRAVENOUS | Status: AC
  Administered 2016-04-23: 12:00:00 via INTRAVENOUS

## 2016-04-23 MED ORDER — DIPHENHYDRAMINE HCL 50 MG/ML IJ SOLN
50.0000 mg | Freq: Once | INTRAMUSCULAR | Status: AC
  Administered 2016-04-23: 50 mg via INTRAVENOUS

## 2016-04-23 MED ORDER — CETUXIMAB CHEMO IV INJECTION 200 MG/100ML
500.0000 mg/m2 | Freq: Once | INTRAVENOUS | Status: AC
  Administered 2016-04-23: 1000 mg via INTRAVENOUS
  Filled 2016-04-23: qty 500

## 2016-04-23 MED ORDER — SODIUM CHLORIDE 0.9% FLUSH
10.0000 mL | INTRAVENOUS | Status: DC | PRN
  Administered 2016-04-23: 10 mL
  Filled 2016-04-23: qty 10

## 2016-04-23 NOTE — Progress Notes (Signed)
Hematology and Oncology Follow Up Visit  Ryan Maynard [id number] Jul 18, 1950 65 y.o. 04/23/2016   Principle Diagnosis:  Metastatic colon cancer - K-RAS wild-type - progressive  Current Therapy:   XELOX/Avastin s/p cycle 5 XELIRI/Erbitux - s/p c#8  (28 day cycles) - omit the Irinotecan Status post RFA of liver metastases    Interim History:  Ryan Maynard is here today for follow-up. He looks quite good. He feels pretty good. He is doing some yard work. He is able to do more now that the irinotecan has been discontinued.  The Erbitux is still causing a lot of a skin rash. However, he seems to be tolerating this pretty well. He is on the "cocktail" to help with the skin rash.  Unfortunately, the CEA continues to rise. 2 months ago it was 19. Now it is up to 80.  In as is, he just is on Xeloda with Erbitux. The irinotecan, which I think really has been helpful, just caused too much in the way of toxicity. He really wants to have good quality of life which I totally understand.  His appetite is doing okay. He is really not lost any weight. He is not having diarrhea. He is having a little bit of constipation.   Overall, his performance status is ECOG 1 . Medications: (   Medication List       Accurate as of 04/23/16  2:30 PM. Always use your most recent med list.          amLODipine-benazepril 5-20 MG capsule Commonly known as:  LOTREL Take 1 capsule by mouth daily.   baclofen 10 MG tablet Commonly known as:  LIORESAL Take 1 pill if needed every 8 hrs for hiccups   capecitabine 500 MG tablet Commonly known as:  XELODA TAKE 3 TABLETS (1500 MG) BY MOUTH TWICE A DAY AFTER A MEAL FOR 14 DAYS ON, THEN 7 DAYS OFF.   Coenzyme Q10 10 MG capsule Take 10 mg by mouth daily.   diclofenac sodium 1 % Gel Commonly known as:  VOLTAREN Apply 2 g topically 4 (four) times daily.   doxycycline 100 MG tablet Commonly known as:  VIBRA-TABS Take 1 tablet (100 mg total) by mouth 2 (two) times  daily.   gabapentin 100 MG capsule Commonly known as:  NEURONTIN   gabapentin 100 MG capsule Commonly known as:  NEURONTIN Take 2 capsules (200 mg total) by mouth 3 (three) times daily. Pt okay to take only once a day   ibuprofen 200 MG tablet Commonly known as:  ADVIL,MOTRIN Take 200-400 mg by mouth every 6 (six) hours as needed.   lidocaine-prilocaine cream Commonly known as:  EMLA Apply 1 application topically as needed. Apply quarter sized amount to portacath site 1-2 hours prior to chemotherapy appt.  Cover with saran wrap.   LORazepam 1 MG tablet Commonly known as:  ATIVAN Take 1 tablet (1 mg total) by mouth every 6 (six) hours as needed (NAUSEA).   oxyCODONE-acetaminophen 5-325 MG tablet Commonly known as:  ROXICET Take 1-2 tablets by mouth every 8 (eight) hours as needed for severe pain.   oxymetazoline 0.05 % nasal spray Commonly known as:  AFRIN Place 1 spray into both nostrils 2 (two) times daily as needed for congestion.   Pancrelipase (Lip-Prot-Amyl) 24000-76000 units Cpep Take 1 capsule (24,000 Units total) by mouth 3 (three) times daily before meals.   traZODone 50 MG tablet Commonly known as:  DESYREL Take 1 tablet (50 mg total) by mouth at bedtime as  needed for sleep.       Allergies:  Allergies  Allergen Reactions  . Bactrim [Sulfamethoxazole-Trimethoprim] Diarrhea    Has taken 2 different times and both times frequent diarrhea  . Lyrica [Pregabalin]     "thought I was going to die"  . Zofran [Ondansetron Hcl] Other (See Comments)    Hiccups    Past Medical History, Surgical history, Social history, and Family History were reviewed and updated.  Review of Systems: All other 10 point review of systems is negative.   Physical Exam:  weight is 200 lb (90.7 kg). His oral temperature is 98.2 F (36.8 C). His blood pressure is 133/79 and his pulse is 75. His respiration is 16.   Wt Readings from Last 3 Encounters:  04/23/16 200 lb (90.7 kg)    02/27/16 197 lb 1.9 oz (89.4 kg)  01/23/16 195 lb (88.5 kg)    Well-developed and well-nourished white male in no obvious distress. Head and neck exam shows no ocular or oral lesions. There are no palpable cervical or supraclavicular lymph nodes. Lungs are clear. Cardiac exam regular rate and rhythm with no murmurs, rubs or bruits. Abdomen is soft. There may be some slight distention. Bowel sounds are present. He has no guarding or rebound tenderness. No obvious abdominal masses noted. He has no hepatomegaly. Back exam shows no tenderness over the spine, ribs or hips. Externally shows no clubbing, cyanosis or edema. There may be some slight swelling in the left knee. He has good range of motion of the knee. Skin exam shows no rashes, ecchymosis or petechia. Neurological exam is nonfocal.   Lab Results  Component Value Date   WBC 5.4 04/23/2016   HGB 16.5 04/23/2016   HCT 46.8 04/23/2016   MCV 94 04/23/2016   PLT 190 04/23/2016   No results found for: FERRITIN, IRON, TIBC, UIBC, IRONPCTSAT Lab Results  Component Value Date   RBC 4.98 04/23/2016   No results found for: KPAFRELGTCHN, LAMBDASER, KAPLAMBRATIO No results found for: IGGSERUM, IGA, IGMSERUM No results found for: Odetta Pink, SPEI   Chemistry      Component Value Date/Time   NA 138 04/23/2016 1011   NA 140 12/26/2015 1013   K 3.7 04/23/2016 1011   K 4.1 12/26/2015 1013   CL 105 04/23/2016 1011   CO2 28 04/23/2016 1011   CO2 24 12/26/2015 1013   BUN 10 04/23/2016 1011   BUN 16.8 12/26/2015 1013   CREATININE 0.6 04/23/2016 1011   CREATININE 0.8 12/26/2015 1013      Component Value Date/Time   CALCIUM 9.6 04/23/2016 1011   CALCIUM 9.3 12/26/2015 1013   ALKPHOS 100 (H) 04/23/2016 1011   ALKPHOS 109 12/26/2015 1013   AST 33 04/23/2016 1011   AST 28 12/26/2015 1013   ALT 31 04/23/2016 1011   ALT 30 12/26/2015 1013   BILITOT 1.00 04/23/2016 1011   BILITOT 0.77  12/26/2015 1013     Impression and Plan: Ryan Maynard is 65 year old gentleman with metastatic colon cancer.   I think that it is obvious that his tumor is growing. Again, with the holidays coming up, he does not want to make any changes with his protocol since he is feeling well.  At some point, we really are going to have to do a biopsy. He really has not had a biopsy since we first saw him. I think a biopsy would be essential that we can check for MSI instability. I realize  this would be in a minute or 2 patients but yet, if he does have MSI instability, then we could utilize immunotherapy.   He does not want have anything done until after the holidays.  We will proceed with treatment. I will plan to get him back in one month for his next cycle. After that, then we will do our scans and biopsies.   I spent about 30 minutes with him today.    Volanda Napoleon, MD 11/21/20172:30 PM

## 2016-04-23 NOTE — Patient Instructions (Signed)
Cetuximab injection What is this medicine? CETUXIMAB (se TUX i mab) is a monoclonal antibody. It is used to treat colorectal cancer and head and neck cancer. This medicine may be used for other purposes; ask your health care provider or pharmacist if you have questions. What should I tell my health care provider before I take this medicine? They need to know if you have any of these conditions: -heart disease -history of irregular heartbeat -history of low levels of calcium, magnesium, or potassium in the blood -lung or breathing disease, like asthma -an unusual or allergic reaction to cetuximab, other medicines, foods, dyes, or preservatives -pregnant or trying to get pregnant -breast-feeding How should I use this medicine? This drug is given as an infusion into a vein. It is administered in a hospital or clinic by a specially trained health care professional. Talk to your pediatrician regarding the use of this medicine in children. Special care may be needed. Overdosage: If you think you have taken too much of this medicine contact a poison control center or emergency room at once. NOTE: This medicine is only for you. Do not share this medicine with others. What if I miss a dose? It is important not to miss your dose. Call your doctor or health care professional if you are unable to keep an appointment. What may interact with this medicine? Interactions are not expected. This list may not describe all possible interactions. Give your health care provider a list of all the medicines, herbs, non-prescription drugs, or dietary supplements you use. Also tell them if you smoke, drink alcohol, or use illegal drugs. Some items may interact with your medicine. What should I watch for while using this medicine? Visit your doctor or health care professional for regular checks on your progress. This drug may make you feel generally unwell. This is not uncommon, as chemotherapy can affect healthy cells  as well as cancer cells. Report any side effects. Continue your course of treatment even though you feel ill unless your doctor tells you to stop. This medicine can make you more sensitive to the sun. Keep out of the sun while taking this medicine and for 2 months after the last dose. If you cannot avoid being in the sun, wear protective clothing and use sunscreen. Do not use sun lamps or tanning beds/booths. You may need blood work done while you are taking this medicine. In some cases, you may be given additional medicines to help with side effects. Follow all directions for their use. Call your doctor or health care professional for advice if you get a fever, chills or sore throat, or other symptoms of a cold or flu. Do not treat yourself. This drug decreases your body's ability to fight infections. Try to avoid being around people who are sick. Avoid taking products that contain aspirin, acetaminophen, ibuprofen, naproxen, or ketoprofen unless instructed by your doctor. These medicines may hide a fever. Do not become pregnant while taking this medicine. Women should inform their doctor if they wish to become pregnant or think they might be pregnant. There is a potential for serious side effects to an unborn child. Use adequate birth control methods. Avoid pregnancy for at least 6 months after your last dose. Talk to your health care professional or pharmacist for more information. Do not breast-feed an infant while taking this medicine or during the 2 months after your last dose. What side effects may I notice from receiving this medicine? Side effects that you should report to   your doctor or health care professional as soon as possible: -allergic reactions like skin rash, itching or hives, swelling of the face, lips, or tongue -breathing problems -changes in vision -fast, irregular heartbeat -feeling faint or lightheaded, falls -fever, chills -mouth sores -redness, blistering, peeling or  loosening of the skin, including inside the mouth -trouble passing urine or change in the amount of urine -unusually weak or tired Side effects that usually do not require medical attention (report to your doctor or health care professional if they continue or are bothersome): -changes in skin like acne, cracks, skin dryness -constipation -diarrhea -headache -nail changes -nausea, vomiting -stomach upset -weight loss This list may not describe all possible side effects. Call your doctor for medical advice about side effects. You may report side effects to FDA at 1-800-FDA-1088. Where should I keep my medicine? This drug is given in a hospital or clinic and will not be stored at home. NOTE: This sheet is a summary. It may not cover all possible information. If you have questions about this medicine, talk to your doctor, pharmacist, or health care provider.    2016, [employer]. (2014-07-27 22:27:08)  

## 2016-05-21 ENCOUNTER — Ambulatory Visit (HOSPITAL_BASED_OUTPATIENT_CLINIC_OR_DEPARTMENT_OTHER): Payer: Medicare Other | Admitting: Hematology & Oncology

## 2016-05-21 ENCOUNTER — Other Ambulatory Visit (HOSPITAL_BASED_OUTPATIENT_CLINIC_OR_DEPARTMENT_OTHER): Payer: Medicare Other

## 2016-05-21 ENCOUNTER — Ambulatory Visit (HOSPITAL_BASED_OUTPATIENT_CLINIC_OR_DEPARTMENT_OTHER): Payer: Medicare Other

## 2016-05-21 VITALS — BP 151/89 | HR 76 | Temp 98.4°F | Resp 20 | Wt 201.0 lb

## 2016-05-21 DIAGNOSIS — C189 Malignant neoplasm of colon, unspecified: Secondary | ICD-10-CM

## 2016-05-21 DIAGNOSIS — C787 Secondary malignant neoplasm of liver and intrahepatic bile duct: Secondary | ICD-10-CM

## 2016-05-21 DIAGNOSIS — I10 Essential (primary) hypertension: Secondary | ICD-10-CM

## 2016-05-21 DIAGNOSIS — Z5112 Encounter for antineoplastic immunotherapy: Secondary | ICD-10-CM

## 2016-05-21 DIAGNOSIS — R112 Nausea with vomiting, unspecified: Secondary | ICD-10-CM

## 2016-05-21 DIAGNOSIS — C9110 Chronic lymphocytic leukemia of B-cell type not having achieved remission: Secondary | ICD-10-CM

## 2016-05-21 LAB — CMP (CANCER CENTER ONLY)
ALT(SGPT): 29 U/L (ref 10–47)
AST: 33 U/L (ref 11–38)
Albumin: 3.8 g/dL (ref 3.3–5.5)
Alkaline Phosphatase: 108 U/L — ABNORMAL HIGH (ref 26–84)
BILIRUBIN TOTAL: 0.9 mg/dL (ref 0.20–1.60)
BUN, Bld: 9 mg/dL (ref 7–22)
CALCIUM: 9.7 mg/dL (ref 8.0–10.3)
CO2: 28 meq/L (ref 18–33)
CREATININE: 0.7 mg/dL (ref 0.6–1.2)
Chloride: 105 mEq/L (ref 98–108)
GLUCOSE: 109 mg/dL (ref 73–118)
Potassium: 3.7 mEq/L (ref 3.3–4.7)
SODIUM: 140 meq/L (ref 128–145)
Total Protein: 7.2 g/dL (ref 6.4–8.1)

## 2016-05-21 LAB — CBC WITH DIFFERENTIAL (CANCER CENTER ONLY)
BASO#: 0.1 10*3/uL (ref 0.0–0.2)
BASO%: 1.7 % (ref 0.0–2.0)
EOS ABS: 0.2 10*3/uL (ref 0.0–0.5)
EOS%: 3.5 % (ref 0.0–7.0)
HCT: 48.2 % (ref 38.7–49.9)
HEMOGLOBIN: 17.2 g/dL — AB (ref 13.0–17.1)
LYMPH#: 1.3 10*3/uL (ref 0.9–3.3)
LYMPH%: 22.6 % (ref 14.0–48.0)
MCH: 32.8 pg (ref 28.0–33.4)
MCHC: 35.7 g/dL (ref 32.0–35.9)
MCV: 92 fL (ref 82–98)
MONO#: 0.6 10*3/uL (ref 0.1–0.9)
MONO%: 9.9 % (ref 0.0–13.0)
NEUT#: 3.6 10*3/uL (ref 1.5–6.5)
NEUT%: 62.3 % (ref 40.0–80.0)
PLATELETS: 185 10*3/uL (ref 145–400)
RBC: 5.24 10*6/uL (ref 4.20–5.70)
RDW: 13 % (ref 11.1–15.7)
WBC: 5.7 10*3/uL (ref 4.0–10.0)

## 2016-05-21 LAB — CEA (IN HOUSE-CHCC): CEA (CHCC-In House): 125.75 ng/mL — ABNORMAL HIGH (ref 0.00–5.00)

## 2016-05-21 MED ORDER — SODIUM CHLORIDE 0.9 % IV SOLN
Freq: Once | INTRAVENOUS | Status: AC
  Administered 2016-05-21: 12:00:00 via INTRAVENOUS

## 2016-05-21 MED ORDER — DIPHENHYDRAMINE HCL 50 MG/ML IJ SOLN
50.0000 mg | Freq: Once | INTRAMUSCULAR | Status: AC
  Administered 2016-05-21: 50 mg via INTRAVENOUS

## 2016-05-21 MED ORDER — CETUXIMAB CHEMO IV INJECTION 200 MG/100ML
500.0000 mg/m2 | Freq: Once | INTRAVENOUS | Status: AC
  Administered 2016-05-21: 1000 mg via INTRAVENOUS
  Filled 2016-05-21: qty 400

## 2016-05-21 MED ORDER — SODIUM CHLORIDE 0.9% FLUSH
10.0000 mL | INTRAVENOUS | Status: DC | PRN
  Administered 2016-05-21: 10 mL
  Filled 2016-05-21: qty 10

## 2016-05-21 MED ORDER — DIPHENHYDRAMINE HCL 50 MG/ML IJ SOLN
INTRAMUSCULAR | Status: AC
  Filled 2016-05-21: qty 1

## 2016-05-21 MED ORDER — SODIUM CHLORIDE 0.9% FLUSH
3.0000 mL | INTRAVENOUS | Status: DC | PRN
  Filled 2016-05-21: qty 10

## 2016-05-21 MED ORDER — ALTEPLASE 2 MG IJ SOLR
2.0000 mg | Freq: Once | INTRAMUSCULAR | Status: DC | PRN
  Filled 2016-05-21: qty 2

## 2016-05-21 MED ORDER — HEPARIN SOD (PORK) LOCK FLUSH 100 UNIT/ML IV SOLN
500.0000 [IU] | Freq: Once | INTRAVENOUS | Status: AC | PRN
  Administered 2016-05-21: 500 [IU]
  Filled 2016-05-21: qty 5

## 2016-05-21 MED ORDER — CAPECITABINE 500 MG PO TABS: ORAL_TABLET | ORAL | 3 refills | Status: DC

## 2016-05-21 MED ORDER — HEPARIN SOD (PORK) LOCK FLUSH 100 UNIT/ML IV SOLN
250.0000 [IU] | Freq: Once | INTRAVENOUS | Status: DC | PRN
  Filled 2016-05-21: qty 5

## 2016-05-21 NOTE — Progress Notes (Signed)
Hematology and Oncology Follow Up Visit  Ryan Maynard [id number] [birth year] 65 y.o. 05/21/2016   Principle Diagnosis:  Metastatic colon cancer - K-RAS wild-type - progressive  Current Therapy:   XELOX/Avastin s/p cycle 5 XELIRI/Erbitux - s/p c#8  (28 day cycles) - omit the Irinotecan; Xeloda not     being taken due to cost factor. Status post RFA of liver metastases    Interim History:  Ryan Maynard is here today for follow-up. He looks quite good. He feels pretty good. He is doing some yard work. He is able to do more now that the irinotecan has been discontinued.  He also is not taking the Xeloda. He cannot afford the $1200 a month that he has to pay. Somehow, Medicare is not covering this. This is quite unfortunate. Maybe, the company will help pay for this.  The Erbitux is still causing a skin rash. However, he seems to be tolerating this pretty well. He is on the "cocktail" to help with the skin rash.  Unfortunately, the CEA continues to rise. 2 months ago it was 104. Now it is up to 80. The rate of rise is not as quick.  His appetite is doing okay. He is really not lost any weight. He is not having diarrhea. He is having a little bit of constipation.   He did have a nice Thanksgiving. He is looking forward to Christmas.  Overall, his performance status is ECOG 1 . Medications: ( Allergies as of 05/21/2016      Reactions   Bactrim [sulfamethoxazole-trimethoprim] Diarrhea   Has taken 2 different times and both times frequent diarrhea   Lyrica [pregabalin]    "thought I was going to die"   Zofran [ondansetron Hcl] Other (See Comments)   Hiccups      Medication List       Accurate as of 05/21/16 11:41 AM. Always use your most recent med list.          amLODipine-benazepril 5-20 MG capsule Commonly known as:  LOTREL Take 1 capsule by mouth daily.   capecitabine 500 MG tablet Commonly known as:  XELODA TAKE 3 TABLETS (1500 MG) BY MOUTH TWICE A DAY AFTER A MEAL FOR 14  DAYS ON, THEN 7 DAYS OFF.   Coenzyme Q10 10 MG capsule Take 10 mg by mouth daily.   doxycycline 100 MG tablet Commonly known as:  VIBRA-TABS Take 1 tablet (100 mg total) by mouth 2 (two) times daily.   gabapentin 100 MG capsule Commonly known as:  NEURONTIN Take 2 capsules (200 mg total) by mouth 3 (three) times daily. Pt okay to take only once a day   ibuprofen 200 MG tablet Commonly known as:  ADVIL,MOTRIN Take 200-400 mg by mouth every 6 (six) hours as needed.   lidocaine-prilocaine cream Commonly known as:  EMLA Apply 1 application topically as needed. Apply quarter sized amount to portacath site 1-2 hours prior to chemotherapy appt.  Cover with saran wrap.   LORazepam 1 MG tablet Commonly known as:  ATIVAN Take 1 tablet (1 mg total) by mouth every 6 (six) hours as needed (NAUSEA).   oxyCODONE-acetaminophen 5-325 MG tablet Commonly known as:  ROXICET Take 1-2 tablets by mouth every 8 (eight) hours as needed for severe pain.   oxymetazoline 0.05 % nasal spray Commonly known as:  AFRIN Place 1 spray into both nostrils 2 (two) times daily as needed for congestion.   traZODone 50 MG tablet Commonly known as:  DESYREL Take 1 tablet (50 mg  total) by mouth at bedtime as needed for sleep.       Allergies:  Allergies  Allergen Reactions  . Bactrim [Sulfamethoxazole-Trimethoprim] Diarrhea    Has taken 2 different times and both times frequent diarrhea  . Lyrica [Pregabalin]     "thought I was going to die"  . Zofran [Ondansetron Hcl] Other (See Comments)    Hiccups    Past Medical History, Surgical history, Social history, and Family History were reviewed and updated.  Review of Systems: All other 10 point review of systems is negative.   Physical Exam:  weight is 201 lb (91.2 kg). His oral temperature is 98.4 F (36.9 C). His blood pressure is 151/89 (abnormal) and his pulse is 76. His respiration is 20.   Wt Readings from Last 3 Encounters:  05/21/16 201 lb  (91.2 kg)  04/23/16 200 lb (90.7 kg)  02/27/16 197 lb 1.9 oz (89.4 kg)    Well-developed and well-nourished white male in no obvious distress. Head and neck exam shows no ocular or oral lesions. There are no palpable cervical or supraclavicular lymph nodes. Lungs are clear. Cardiac exam regular rate and rhythm with no murmurs, rubs or bruits. Abdomen is soft. There may be some slight distention. Bowel sounds are present. He has no guarding or rebound tenderness. No obvious abdominal masses noted. He has no hepatomegaly. Back exam shows no tenderness over the spine, ribs or hips. Externally shows no clubbing, cyanosis or edema. There may be some slight swelling in the left knee. He has good range of motion of the knee. Skin exam shows no rashes, ecchymosis or petechia. Neurological exam is nonfocal.   Lab Results  Component Value Date   WBC 5.7 05/21/2016   HGB 17.2 (H) 05/21/2016   HCT 48.2 05/21/2016   MCV 92 05/21/2016   PLT 185 05/21/2016   No results found for: FERRITIN, IRON, TIBC, UIBC, IRONPCTSAT Lab Results  Component Value Date   RBC 5.24 05/21/2016   No results found for: KPAFRELGTCHN, LAMBDASER, KAPLAMBRATIO No results found for: IGGSERUM, IGA, IGMSERUM No results found for: Odetta Pink, SPEI   Chemistry      Component Value Date/Time   NA 140 05/21/2016 1023   NA 140 12/26/2015 1013   K 3.7 05/21/2016 1023   K 4.1 12/26/2015 1013   CL 105 05/21/2016 1023   CO2 28 05/21/2016 1023   CO2 24 12/26/2015 1013   BUN 9 05/21/2016 1023   BUN 16.8 12/26/2015 1013   CREATININE 0.7 05/21/2016 1023   CREATININE 0.8 12/26/2015 1013      Component Value Date/Time   CALCIUM 9.7 05/21/2016 1023   CALCIUM 9.3 12/26/2015 1013   ALKPHOS 108 (H) 05/21/2016 1023   ALKPHOS 109 12/26/2015 1013   AST 33 05/21/2016 1023   AST 28 12/26/2015 1013   ALT 29 05/21/2016 1023   ALT 30 12/26/2015 1013   BILITOT 0.90 05/21/2016 1023    BILITOT 0.77 12/26/2015 1013     Impression and Plan: Ryan Maynard is 64 year old gentleman with metastatic colon cancer.   For now, we will proceed with the Erbitux. I think it is very sad that he cannot get Xeloda because it is not being covered by Medicare. He does not want the infusional 5-FU.  I do think that a biopsy would be helpful. With the biopsy, we can send off for genetic instability (MMR and MSI). We can see how all this looks. We might be over  use immunotherapy.   If he does not qualify for immunotherapy, then he might agree to [employer]. I talked to him and his wife about this. I explained to him what Lonsurf was. I went over some of the side effects.  I will set him up for a biopsy. This will be the second week in January.   I spent about 30 minutes with him today.    Volanda Napoleon, MD 12/19/201711:41 AM

## 2016-05-21 NOTE — Patient Instructions (Signed)
Cetuximab injection What is this medicine? CETUXIMAB (se TUX i mab) is a monoclonal antibody. It is used to treat colorectal cancer and head and neck cancer. This medicine may be used for other purposes; ask your health care provider or pharmacist if you have questions. COMMON BRAND NAME(S): Erbitux What should I tell my health care provider before I take this medicine? They need to know if you have any of these conditions: -heart disease -history of irregular heartbeat -history of low levels of calcium, magnesium, or potassium in the blood -lung or breathing disease, like asthma -an unusual or allergic reaction to cetuximab, other medicines, foods, dyes, or preservatives -pregnant or trying to get pregnant -breast-feeding How should I use this medicine? This drug is given as an infusion into a vein. It is administered in a hospital or clinic by a specially trained health care professional. Talk to your pediatrician regarding the use of this medicine in children. Special care may be needed. Overdosage: If you think you have taken too much of this medicine contact a poison control center or emergency room at once. NOTE: This medicine is only for you. Do not share this medicine with others. What if I miss a dose? It is important not to miss your dose. Call your doctor or health care professional if you are unable to keep an appointment. What may interact with this medicine? Interactions are not expected. This list may not describe all possible interactions. Give your health care provider a list of all the medicines, herbs, non-prescription drugs, or dietary supplements you use. Also tell them if you smoke, drink alcohol, or use illegal drugs. Some items may interact with your medicine. What should I watch for while using this medicine? Visit your doctor or health care professional for regular checks on your progress. This drug may make you feel generally unwell. This is not uncommon, as  chemotherapy can affect healthy cells as well as cancer cells. Report any side effects. Continue your course of treatment even though you feel ill unless your doctor tells you to stop. This medicine can make you more sensitive to the sun. Keep out of the sun while taking this medicine and for 2 months after the last dose. If you cannot avoid being in the sun, wear protective clothing and use sunscreen. Do not use sun lamps or tanning beds/booths. You may need blood work done while you are taking this medicine. In some cases, you may be given additional medicines to help with side effects. Follow all directions for their use. Call your doctor or health care professional for advice if you get a fever, chills or sore throat, or other symptoms of a cold or flu. Do not treat yourself. This drug decreases your body's ability to fight infections. Try to avoid being around people who are sick. Avoid taking products that contain aspirin, acetaminophen, ibuprofen, naproxen, or ketoprofen unless instructed by your doctor. These medicines may hide a fever. Do not become pregnant while taking this medicine. Women should inform their doctor if they wish to become pregnant or think they might be pregnant. There is a potential for serious side effects to an unborn child. Use adequate birth control methods. Avoid pregnancy for at least 6 months after your last dose. Talk to your health care professional or pharmacist for more information. Do not breast-feed an infant while taking this medicine or during the 2 months after your last dose. What side effects may I notice from receiving this medicine? Side effects that  you should report to your doctor or health care professional as soon as possible: -allergic reactions like skin rash, itching or hives, swelling of the face, lips, or tongue -breathing problems -changes in vision -fast, irregular heartbeat -feeling faint or lightheaded, falls -fever, chills -mouth  sores -redness, blistering, peeling or loosening of the skin, including inside the mouth -trouble passing urine or change in the amount of urine -unusually weak or tired Side effects that usually do not require medical attention (report to your doctor or health care professional if they continue or are bothersome): -changes in skin like acne, cracks, skin dryness -constipation -diarrhea -headache -nail changes -nausea, vomiting -stomach upset -weight loss This list may not describe all possible side effects. Call your doctor for medical advice about side effects. You may report side effects to FDA at 1-800-FDA-1088. Where should I keep my medicine? This drug is given in a hospital or clinic and will not be stored at home. NOTE: This sheet is a summary. It may not cover all possible information. If you have questions about this medicine, talk to your doctor, pharmacist, or health care provider.  2017 [employer]vier/Gold Standard (2014-07-27 22:27:08)

## 2016-05-23 ENCOUNTER — Telehealth: Payer: Self-pay | Admitting: *Deleted

## 2016-05-23 MED FILL — CAPECITABINE 500 MG TABLET: 500 | 21 days supply | Qty: 84 | Fill #0

## 2016-05-23 NOTE — Telephone Encounter (Addendum)
Patient wants prescription transferred to [facility]. Called and had it transferred. Patient aware he can pick up today.   ----- Message from Volanda Napoleon, MD sent at 05/22/2016  5:21 PM EST ----- I called and talked to Mr. Vanwagoner. I told him that we were able to get the Xeloda for him for only about $170 a month. He is okay with this.  We need to let him know that he needs to pick this up at the [facility] outpatient pharmacy.  He can also pick this up here but the pharmacy here needs to know so the 'script can be transferred here.  Please call him and see what he wants to do!!  [place]

## 2016-06-05 ENCOUNTER — Encounter: Payer: Self-pay | Admitting: Diagnostic Radiology

## 2016-06-10 ENCOUNTER — Other Ambulatory Visit: Payer: Self-pay | Admitting: Radiology

## 2016-06-11 ENCOUNTER — Ambulatory Visit (HOSPITAL_COMMUNITY)
Admission: RE | Admit: 2016-06-11 | Discharge: 2016-06-11 | Disposition: A | Discharge status: Home / Self Care | Payer: Medicare Other | Source: Ambulatory Visit | Attending: Hematology & Oncology | Admitting: Hematology & Oncology

## 2016-06-11 ENCOUNTER — Encounter (HOSPITAL_COMMUNITY): Payer: Self-pay

## 2016-06-11 DIAGNOSIS — K769 Liver disease, unspecified: Secondary | ICD-10-CM | POA: Diagnosis not present

## 2016-06-11 DIAGNOSIS — C189 Malignant neoplasm of colon, unspecified: Secondary | ICD-10-CM

## 2016-06-11 DIAGNOSIS — C787 Secondary malignant neoplasm of liver and intrahepatic bile duct: Secondary | ICD-10-CM | POA: Insufficient documentation

## 2016-06-11 DIAGNOSIS — Z85038 Personal history of other malignant neoplasm of large intestine: Secondary | ICD-10-CM | POA: Diagnosis not present

## 2016-06-11 HISTORY — DX: Nasal congestion: R09.81

## 2016-06-11 LAB — CBC WITH DIFFERENTIAL/PLATELET
BASOS ABS: 0.1 10*3/uL (ref 0.0–0.1)
Basophils Relative: 1 %
Eosinophils Absolute: 0.1 10*3/uL (ref 0.0–0.7)
Eosinophils Relative: 1 %
HEMATOCRIT: 47.2 % (ref 39.0–52.0)
HEMOGLOBIN: 17.2 g/dL — AB (ref 13.0–17.0)
LYMPHS PCT: 23 %
Lymphs Abs: 1.6 10*3/uL (ref 0.7–4.0)
MCH: 33.3 pg (ref 26.0–34.0)
MCHC: 36.4 g/dL — ABNORMAL HIGH (ref 30.0–36.0)
MCV: 91.5 fL (ref 78.0–100.0)
MONO ABS: 0.6 10*3/uL (ref 0.1–1.0)
Monocytes Relative: 8 %
NEUTROS ABS: 4.6 10*3/uL (ref 1.7–7.7)
Neutrophils Relative %: 67 %
Platelets: 187 10*3/uL (ref 150–400)
RBC: 5.16 MIL/uL (ref 4.22–5.81)
RDW: 13.9 % (ref 11.5–15.5)
WBC: 6.9 10*3/uL (ref 4.0–10.5)

## 2016-06-11 LAB — COMPREHENSIVE METABOLIC PANEL
ALK PHOS: 116 U/L (ref 38–126)
ALT: 26 U/L (ref 17–63)
AST: 29 U/L (ref 15–41)
Albumin: 4.4 g/dL (ref 3.5–5.0)
Anion gap: 9 (ref 5–15)
BILIRUBIN TOTAL: 1 mg/dL (ref 0.3–1.2)
BUN: 10 mg/dL (ref 6–20)
CALCIUM: 8.9 mg/dL (ref 8.9–10.3)
CO2: 25 mmol/L (ref 22–32)
Chloride: 103 mmol/L (ref 101–111)
Creatinine, Ser: 0.75 mg/dL (ref 0.61–1.24)
GFR calc Af Amer: >60 mL/min (ref >60)
Glucose, Bld: 102 mg/dL — ABNORMAL HIGH (ref 65–99)
Potassium: 3.5 mmol/L (ref 3.5–5.1)
Sodium: 137 mmol/L (ref 135–145)
TOTAL PROTEIN: 7.4 g/dL (ref 6.5–8.1)

## 2016-06-11 LAB — PROTIME-INR
INR: 1.04
PROTHROMBIN TIME: 13.6 s (ref 11.4–15.2)

## 2016-06-11 MED ORDER — FENTANYL CITRATE (PF) 100 MCG/2ML IJ SOLN
INTRAMUSCULAR | Status: AC
  Filled 2016-06-11: qty 4

## 2016-06-11 MED ORDER — SODIUM CHLORIDE 0.9 % IV SOLN
INTRAVENOUS | Status: DC
  Administered 2016-06-11: 11:00:00 via INTRAVENOUS

## 2016-06-11 MED ORDER — FENTANYL CITRATE (PF) 100 MCG/2ML IJ SOLN
INTRAMUSCULAR | Status: AC | PRN
  Administered 2016-06-11: 50 ug via INTRAVENOUS
  Administered 2016-06-11 (×2): 25 ug via INTRAVENOUS

## 2016-06-11 MED ORDER — MIDAZOLAM HCL 2 MG/2ML IJ SOLN
INTRAMUSCULAR | Status: AC
  Filled 2016-06-11: qty 6

## 2016-06-11 MED ORDER — MIDAZOLAM HCL 2 MG/2ML IJ SOLN
INTRAMUSCULAR | Status: AC | PRN
  Administered 2016-06-11 (×3): 1 mg via INTRAVENOUS

## 2016-06-11 NOTE — Progress Notes (Signed)
Pt. Refused to let RN access power-port, pt. Stated" I only use it for Chemotherapy",peripheral IV started.

## 2016-06-11 NOTE — Discharge Instructions (Signed)
Liver Biopsy °The liver is a large organ in the upper right-hand side of your abdomen. A liver biopsy is a procedure in which a tissue sample is taken from the liver and examined under a microscope. The procedure is done to confirm a suspected problem. °There are three types of liver biopsies: °· Percutaneous. In this type, an incision is made in your abdomen. The sample is removed through the incision with a needle. °· Laparoscopic. In this type, several incisions are made in the abdomen. A tiny camera is passed through one of the incisions to help guide the health care provider. The sample is removed through the other incision or incisions. °· Transjugular. In this type, an incision is made in the neck. A tube is passed through the incision to the liver. The sample is removed through the tube with a needle. °Tell a health care provider about: °· Any allergies you have. °· All medicines you are taking, including vitamins, herbs, eye drops, creams, and over-the-counter medicines. °· Any problems you or family members have had with anesthetic medicines. °· Any blood disorders you have. °· Any surgeries you have had. °· Any medical conditions you have. °· Possibility of pregnancy, if this applies. °What are the risks? °Generally, this is a safe procedure. However, problems can occur and include: °· Bleeding. °· Infection. °· Bruising. °· Collapsed lung. °· Leak of digestive juices (bile) from the liver or gallbladder. °· Problems with heart rhythm. °· Pain at the biopsy site or in the right shoulder. °· Low blood pressure (hypotension). °· Injury to nearby organs or tissues. °What happens before the procedure? °· Your health care provider may do some blood or urine tests. These will help your health care provider learn how well your kidneys and liver are working and how well your blood clots. °· Ask your health care provider if you will be able to go home the day of the procedure. Arrange for someone to take you home  and stay with you for at least 24 hours. °· Do not eat or drink anything after midnight on the night before the procedure or as directed by your health care provider. °· Ask your health care provider about: °¨ Changing or stopping your regular medicines. This is especially important if you are taking diabetes medicines or blood thinners. °¨ Taking medicines such as aspirin and ibuprofen. These medicines can thin your blood. Do not take these medicines before your procedure if your health care provider asks you not to. °What happens during the procedure? °Regardless of the type of biopsy that will be done, you will have an IV line placed. Through this line, you will receive fluids and medicine to relax you. If you will be having a laparoscopic biopsy, you may also receive medicine through this line to make you sleep during the procedure (general anesthetic). °Percutaneous Liver Biopsy °· You will positioned on your back, with your right hand over your head. °· A health care provider will locate your liver by tapping and pressing on the right side of your abdomen or with the help of an ultrasound machine or CT scan. °· An area at the bottom of your last right rib will be numbed. °· An incision will be made in the numbed area. °· The biopsy needle will be inserted into the incision. °· Several samples of liver tissue will be taken with the biopsy needle. You will be asked to hold your breath as each sample is taken. °Laparoscopic Liver Biopsy °·   You will be positioned on your back. °· Several small incisions will be made in your abdomen. °· Your doctor will pass a tiny camera through one incision. The camera will allow the liver to be viewed on a TV monitor in the operating room. °· Tools will be passed through the other incision or incisions. These tools will be used to remove samples of liver tissue. °Transjugular Liver Biopsy °· You will be positioned on your back on an X-ray table, with your head turned to your  left. °· An area on your neck just over your jugular vein will be numbed. °· An incision will be made in the numbed area. °· A tiny tube will be inserted through the incision. It will be pushed through the jugular vein to a blood vessel in the liver called the hepatic vein. °· Dye will be inserted through the tube, and X-rays will be taken. The dye will make the blood vessels in the liver light up on the X-rays. °· The biopsy needle will be pushed through the tube until it reaches the liver. °· Samples of liver tissue will be taken with the biopsy needle. °· The needle and the tube will be removed. °After the samples are obtained, the incision or incisions will be closed. °What happens after the procedure? °· You will be taken to a recovery area. °· You may have to lie on your right side for 1-2 hours. This will prevent bleeding from the biopsy site. °· Your progress will be watched. Your blood pressure, pulse, and the biopsy site will be checked often. °· You may have some pain or feel sick. If this happens, tell your health care provider. °· As you begin to feel better, you will be offered ice and beverages. °· You may be allowed to go home when the medicines have worn off and you can walk, drink, eat, and use the bathroom. °This information is not intended to replace advice given to you by your health care provider. Make sure you discuss any questions you have with your health care provider. °Document Released: 08/10/2003 Document Revised: 10/23/2015 Document Reviewed: 07/16/2013 °Elsevier Interactive Patient Education © 2017 [employer] °Liver Biopsy, Care After °Introduction °Refer to this sheet in the next few weeks. These instructions provide you with information on caring for yourself after your procedure. Your health care provider may also give you more specific instructions. Your treatment has been planned according to current medical practices, but problems sometimes occur. Call your health care  provider if you have any problems or questions after your procedure. °What can I expect after the procedure? °After your procedure, it is typical to have the following: °· A small amount of discomfort in the area where the biopsy was done and in the right shoulder or shoulder blade. °· A small amount of bruising around the area where the biopsy was done and on the skin over the liver. °· Sleepiness and fatigue for the rest of the day. °Follow these instructions at home: °· Rest at home for 1-2 days or as directed by your health care provider. °· Have a friend or family member stay with you for at least 24 hours. °· Because of the medicines used during the procedure, you should not do the following things in the first 24 hours: °¨ Drive. °¨ Use machinery. °¨ Be responsible for the care of other people. °¨ Sign legal documents. °¨ Take a bath or shower. °· There are many different ways to close and   cover an incision, including stitches, skin glue, and adhesive strips. Follow your health care provider's instructions on: °¨ Incision care. °¨ Bandage (dressing) changes and removal. °¨ Incision closure removal. °· Do not drink alcohol in the first week. °· Do not lift more than 5 pounds or play contact sports for 2 weeks after this test. °· Take medicines only as directed by your health care provider. Do not take medicine containing aspirin or non-steroidal anti-inflammatory medicines such as ibuprofen for 1 week after this test. °· It is your responsibility to get your test results. °Contact a health care provider if: °· You have increased bleeding from an incision that results in more than a small spot of blood. °· You have redness, swelling, or increasing pain in any incisions. °· You notice a discharge or a bad smell coming from any of your incisions. °· You have a fever or chills. °Get help right away if: °· You develop swelling, bloating, or pain in your abdomen. °· You become dizzy or faint. °· You develop a  rash. °· You are nauseous or vomit. °· You have difficulty breathing, feel short of breath, or feel faint. °· You develop chest pain. °· You have problems with your speech or vision. °· You have trouble balancing or moving your arms or legs. °This information is not intended to replace advice given to you by your health care provider. Make sure you discuss any questions you have with your health care provider. °Document Released: 12/07/2004 Document Revised: 10/26/2015 Document Reviewed: 07/16/2013 °© 2017 Elsevier °Moderate Conscious Sedation, Adult °Sedation is the use of medicines to promote relaxation and relieve discomfort and anxiety. Moderate conscious sedation is a type of sedation. Under moderate conscious sedation, you are less alert than normal, but you are still able to respond to instructions, touch, or both. °Moderate conscious sedation is used during short medical and dental procedures. It is milder than deep sedation, which is a type of sedation under which you cannot be easily woken up. It is also milder than general anesthesia, which is the use of medicines to make you unconscious. Moderate conscious sedation allows you to return to your regular activities sooner. °Tell a health care provider about: °· Any allergies you have. °· All medicines you are taking, including vitamins, herbs, eye drops, creams, and over-the-counter medicines. °· Use of steroids (by mouth or creams). °· Any problems you or family members have had with sedatives and anesthetic medicines. °· Any blood disorders you have. °· Any surgeries you have had. °· Any medical conditions you have, such as sleep apnea. °· Whether you are pregnant or may be pregnant. °· Any use of cigarettes, alcohol, marijuana, or street drugs. °What are the risks? °Generally, this is a safe procedure. However, problems may occur, including: °· Getting too much medicine (oversedation). °· Nausea. °· Allergic reaction to medicines. °· Trouble breathing.  If this happens, a breathing tube may be used to help with breathing. It will be removed when you are awake and breathing on your own. °· Heart trouble. °· Lung trouble. °What happens before the procedure? °Staying hydrated  °Follow instructions from your health care provider about hydration, which may include: °· Up to 2 hours before the procedure - you may continue to drink clear liquids, such as water, clear fruit juice, black coffee, and plain tea. °Eating and drinking restrictions  °Follow instructions from your health care provider about eating and drinking, which may include: °· 8 hours before the procedure - stop   eating heavy meals or foods such as meat, fried foods, or fatty foods. °· 6 hours before the procedure - stop eating light meals or foods, such as toast or cereal. °· 6 hours before the procedure - stop drinking milk or drinks that contain milk. °· 2 hours before the procedure - stop drinking clear liquids. °Medicine  °Ask your health care provider about: °· Changing or stopping your regular medicines. This is especially important if you are taking diabetes medicines or blood thinners. °· Taking medicines such as aspirin and ibuprofen. These medicines can thin your blood. Do not take these medicines before your procedure if your health care provider instructs you not to. °Tests and exams °· You will have a physical exam. °· You may have blood tests done to show: °¨ How well your kidneys and liver are working. °¨ How well your blood can clot. °General instructions °· Plan to have someone take you home from the hospital or clinic. °· If you will be going home right after the procedure, plan to have someone with you for 24 hours. °What happens during the procedure? °· An IV tube will be inserted into one of your veins. °· Medicine to help you relax (sedative) will be given through the IV tube. °· The medical or dental procedure will be performed. °What happens after the procedure? °· Your blood  pressure, heart rate, breathing rate, and blood oxygen level will be monitored often until the medicines you were given have worn off. °· Do not drive for 24 hours. °This information is not intended to replace advice given to you by your health care provider. Make sure you discuss any questions you have with your health care provider. °Document Released: 02/12/2001 Document Revised: 10/24/2015 Document Reviewed: 09/09/2015 °Elsevier Interactive Patient Education © 2017 [employer] ° °

## 2016-06-11 NOTE — Procedures (Signed)
Interventional Radiology Procedure Note  Procedure: US guided biopsy liver lesion  Complications: No immediate  Estimated Blood Loss: 0  Recommendations: - Path pending - Bed rest - DC home  Signed,  Criselda Peaches, MD

## 2016-06-11 NOTE — H&P (Signed)
Referring Physician(s): Ennever,Peter R  Supervising Physician: Jacqulynn Cadet  Patient Status:  [facility] OP  Chief Complaint:  "I'm having a liver biopsy"  Subjective: Patient familiar to IR service from prior right Port-A-Cath placement in 2015 as well as microwave ablation of right hepatic lesions in March 2016 followed by right hepatic lobe Y 90 radio embolization in August 2016. He has known history of metastatic colon cancer ,currently receiving Erbitux and now with rising CEA levels. He presents again today for image guided liver lesion biopsy for genetic markers. He currently denies fever, headache, chest pain, dyspnea, cough, abdominal/back pain, nausea vomiting or abnormal bleeding. Additional history as below. Past Medical History:  Diagnosis Date  . Colon cancer metastasized to liver ([place]) 06/15/2013  . History of chemotherapy jan 2016  . Hypertension   . Nasal congestion   . PONV (postoperative nausea and vomiting) age 66   Past Surgical History:  Procedure Laterality Date  . IR GENERIC HISTORICAL  07/14/2014   IR RADIOLOGIST EVAL & MGMT 07/14/2014 Markus Daft, MD [facility] INTERV RAD  . IR GENERIC HISTORICAL  08/11/2014   IR RADIOLOGIST EVAL & MGMT 08/11/2014 Markus Daft, MD [facility] INTERV RAD  . port a cath insertion  14 months ago   right chest  . right hip growth removed     . skin grafts  age 44     Allergies: Bactrim [sulfamethoxazole-trimethoprim]; Lyrica [pregabalin]; and Zofran [ondansetron hcl]  Medications: Prior to Admission medications   Medication Sig Start Date End Date Taking? Authorizing Provider  amLODipine-benazepril (LOTREL) 5-20 MG capsule Take 1 capsule by mouth daily. 02/27/16  Yes Volanda Napoleon, MD  capecitabine (XELODA) 500 MG tablet TAKE 3 TABLETS (1500 MG) BY MOUTH TWICE A DAY AFTER A MEAL FOR 14 DAYS ON, THEN 7 DAYS OFF. 05/21/16  Yes Volanda Napoleon, MD  Coenzyme Q10 10 MG capsule Take 10 mg by mouth daily.   Yes Historical Provider, MD    doxycycline (VIBRA-TABS) 100 MG tablet Take 1 tablet (100 mg total) by mouth 2 (two) times daily. 08/08/15  Yes Volanda Napoleon, MD  gabapentin (NEURONTIN) 100 MG capsule Take 2 capsules (200 mg total) by mouth 3 (three) times daily. Pt okay to take only once a day 01/23/16  Yes Volanda Napoleon, MD  ibuprofen (ADVIL,MOTRIN) 200 MG tablet Take 200-400 mg by mouth every 6 (six) hours as needed.   Yes Historical Provider, MD  lidocaine-prilocaine (EMLA) cream Apply 1 application topically as needed. Apply quarter sized amount to portacath site 1-2 hours prior to chemotherapy appt.  Cover with saran wrap. 06/23/13  Yes Volanda Napoleon, MD  oxymetazoline (AFRIN) 0.05 % nasal spray Place 1 spray into both nostrils 2 (two) times daily as needed for congestion.   Yes Historical Provider, MD  traZODone (DESYREL) 50 MG tablet Take 1 tablet (50 mg total) by mouth at bedtime as needed for sleep. 04/23/16  Yes Eliezer Bottom, NP  LORazepam (ATIVAN) 1 MG tablet Take 1 tablet (1 mg total) by mouth every 6 (six) hours as needed (NAUSEA). 01/04/16   Volanda Napoleon, MD  oxyCODONE-acetaminophen (ROXICET) 5-325 MG tablet Take 1-2 tablets by mouth every 8 (eight) hours as needed for severe pain. Patient not taking: Reported on 05/21/2016 02/27/16   Volanda Napoleon, MD     Vital Signs: BP (!) 148/101 (BP Location: Left Arm)   Pulse 82   Temp 98.1 F (36.7 C) (Oral)   Resp 20  SpO2 97%   Physical Exam Awake, alert. Intact right chest wall Port-A-Cath; chest with clear breath sounds bilaterally. Heart with regular rate and rhythm. Abdomen soft, protuberant, positive bowel sounds, nontender; lower extremities no edema  Imaging: No results found.  Labs:  CBC:  Recent Labs  03/26/16 0934 04/23/16 1011 05/21/16 1023 06/11/16 1113  WBC 5.1 5.4 5.7 6.9  HGB 16.5 16.5 17.2* 17.2*  HCT 46.4 46.8 48.2 47.2  PLT 163 190 185 187    COAGS: No results for input(s): INR, APTT in the last 8760  hours.  BMP:  Recent Labs  02/27/16 0950 03/26/16 0935 04/23/16 1011 05/21/16 1023  NA 138 137 138 140  K 3.6 3.7 3.7 3.7  CL 105 103 105 105  CO2 28 26 28 28   GLUCOSE 95 143* 102 109  BUN 11 11 10 9   CALCIUM 9.0 9.1 9.6 9.7  CREATININE 0.9 0.6 0.6 0.7    LIVER FUNCTION TESTS:  Recent Labs  02/27/16 0950 03/26/16 0935 04/23/16 1011 05/21/16 1023  BILITOT 1.00 0.90 1.00 0.90  AST 32 28 33 33  ALT 33 29 31 29   ALKPHOS 89* 99* 100* 108*  PROT 6.9 7.2 7.0 7.2  ALBUMIN 3.6 3.7 3.7 3.8    Assessment and Plan: Patient with history of metastatic colon cancer with prior chemotherapy, thermal ablation of right hepatic lobe lesions as well as right hepatic lobe Y 90 radio embolization in 2016. Now with rising CEA levels. He presents today for image guided liver lesion biopsy for further evaluation/genetic markers.Risks and benefits discussed with the patient/significant other including, but not limited to bleeding, infection, damage to adjacent structures or low yield requiring additional tests. All of the patient's questions were answered, patient is agreeable to proceed. Consent signed and in chart. Labs pending.      Electronically Signed: D. Rowe Robert 06/11/2016, 11:31 AM   I spent a total of 20 minutes at the the patient's bedside AND on the patient's hospital floor or unit, greater than 50% of which was counseling/coordinating care for image guided liver lesion biopsy

## 2016-06-18 ENCOUNTER — Ambulatory Visit (HOSPITAL_BASED_OUTPATIENT_CLINIC_OR_DEPARTMENT_OTHER): Payer: Medicare Other | Admitting: Hematology & Oncology

## 2016-06-18 ENCOUNTER — Other Ambulatory Visit (HOSPITAL_BASED_OUTPATIENT_CLINIC_OR_DEPARTMENT_OTHER): Payer: Medicare Other

## 2016-06-18 ENCOUNTER — Ambulatory Visit (HOSPITAL_BASED_OUTPATIENT_CLINIC_OR_DEPARTMENT_OTHER): Payer: Medicare Other

## 2016-06-18 VITALS — BP 147/84 | HR 77 | Temp 98.2°F | Resp 20 | Wt 202.0 lb

## 2016-06-18 DIAGNOSIS — C189 Malignant neoplasm of colon, unspecified: Secondary | ICD-10-CM

## 2016-06-18 DIAGNOSIS — C787 Secondary malignant neoplasm of liver and intrahepatic bile duct: Secondary | ICD-10-CM | POA: Diagnosis not present

## 2016-06-18 DIAGNOSIS — C19 Malignant neoplasm of rectosigmoid junction: Secondary | ICD-10-CM | POA: Diagnosis not present

## 2016-06-18 DIAGNOSIS — Z5112 Encounter for antineoplastic immunotherapy: Secondary | ICD-10-CM | POA: Diagnosis present

## 2016-06-18 LAB — CBC WITH DIFFERENTIAL (CANCER CENTER ONLY)
BASO#: 0.1 10*3/uL (ref 0.0–0.2)
BASO%: 1.2 % (ref 0.0–2.0)
EOS ABS: 0.2 10*3/uL (ref 0.0–0.5)
EOS%: 3.8 % (ref 0.0–7.0)
HEMATOCRIT: 47.9 % (ref 38.7–49.9)
HEMOGLOBIN: 17.1 g/dL (ref 13.0–17.1)
LYMPH#: 1.4 10*3/uL (ref 0.9–3.3)
LYMPH%: 23.6 % (ref 14.0–48.0)
MCH: 33.1 pg (ref 28.0–33.4)
MCHC: 35.7 g/dL (ref 32.0–35.9)
MCV: 93 fL (ref 82–98)
MONO#: 0.5 10*3/uL (ref 0.1–0.9)
MONO%: 9.4 % (ref 0.0–13.0)
NEUT%: 62 % (ref 40.0–80.0)
NEUTROS ABS: 3.6 10*3/uL (ref 1.5–6.5)
Platelets: 150 10*3/uL (ref 145–400)
RBC: 5.16 10*6/uL (ref 4.20–5.70)
RDW: 14 % (ref 11.1–15.7)
WBC: 5.8 10*3/uL (ref 4.0–10.0)

## 2016-06-18 LAB — CEA (IN HOUSE-CHCC): CEA (CHCC-IN HOUSE): 160.16 ng/mL — AB (ref 0.00–5.00)

## 2016-06-18 LAB — CMP (CANCER CENTER ONLY)
ALT(SGPT): 41 U/L (ref 10–47)
AST: 32 U/L (ref 11–38)
Albumin: 3.7 g/dL (ref 3.3–5.5)
Alkaline Phosphatase: 123 U/L — ABNORMAL HIGH (ref 26–84)
BUN, Bld: 10 mg/dL (ref 7–22)
CALCIUM: 9.4 mg/dL (ref 8.0–10.3)
CHLORIDE: 101 meq/L (ref 98–108)
CO2: 28 meq/L (ref 18–33)
Creat: 0.8 mg/dl (ref 0.6–1.2)
Glucose, Bld: 97 mg/dL (ref 73–118)
Potassium: 3.7 mEq/L (ref 3.3–4.7)
Sodium: 137 mEq/L (ref 128–145)
TOTAL PROTEIN: 7.2 g/dL (ref 6.4–8.1)
Total Bilirubin: 1 mg/dl (ref 0.20–1.60)

## 2016-06-18 LAB — LACTATE DEHYDROGENASE: LDH: 205 U/L (ref 125–245)

## 2016-06-18 MED ORDER — CETUXIMAB CHEMO IV INJECTION 200 MG/100ML
500.0000 mg/m2 | Freq: Once | INTRAVENOUS | Status: AC
  Administered 2016-06-18: 1000 mg via INTRAVENOUS
  Filled 2016-06-18: qty 500

## 2016-06-18 MED ORDER — HEPARIN SOD (PORK) LOCK FLUSH 100 UNIT/ML IV SOLN
500.0000 [IU] | Freq: Once | INTRAVENOUS | Status: AC | PRN
  Administered 2016-06-18: 500 [IU]
  Filled 2016-06-18: qty 5

## 2016-06-18 MED ORDER — DIPHENHYDRAMINE HCL 50 MG/ML IJ SOLN
INTRAMUSCULAR | Status: AC
  Filled 2016-06-18: qty 1

## 2016-06-18 MED ORDER — SODIUM CHLORIDE 0.9 % IV SOLN
Freq: Once | INTRAVENOUS | Status: AC
  Administered 2016-06-18: 13:00:00 via INTRAVENOUS

## 2016-06-18 MED ORDER — SODIUM CHLORIDE 0.9% FLUSH
10.0000 mL | INTRAVENOUS | Status: DC | PRN
  Administered 2016-06-18: 10 mL
  Filled 2016-06-18: qty 10

## 2016-06-18 MED ORDER — DIPHENHYDRAMINE HCL 50 MG/ML IJ SOLN
50.0000 mg | Freq: Once | INTRAMUSCULAR | Status: AC
Start: 2016-06-18 — End: 2016-06-18
  Administered 2016-06-18: 50 mg via INTRAVENOUS

## 2016-06-18 MED FILL — CAPECITABINE 500 MG TABLET: 500 | 21 days supply | Qty: 84 | Fill #1

## 2016-06-18 NOTE — Progress Notes (Signed)
Hematology and Oncology Follow Up Visit  Ryan Maynard [id number] 1950/07/11 66 y.o. 06/18/2016   Principle Diagnosis:  Metastatic colon cancer - K-RAS wild-type - progressive  Current Therapy:   XELOX/Avastin s/p cycle 5 XELIRI/Erbitux - s/p c#9  (28 day cycles) - omit the Irinotecan;  Status post RFA of liver metastases    Interim History:  Ryan Maynard is here today for follow-up. He looks quite good. He feels pretty good.he and his wife had a very nice Christmas and New Year's. They went to a [employer] concert in [place]. They really enjoyed the music.   He did have a liver biopsy done on generate ninth. This went well. The pathology report ([id number]) showed metastatic adenocarcinoma, consistent with colorectal cancer.   I spoke to pathology today and also last week. I told him to make sure that we sent off the material for K-ras/BRAF, HER-2, MSI. These results are back yet.   His last CEA was up to 125.   He had been off Xeloda because somehow, this was going to cost him $1300 a month. We were able to get this $117 and now he is on a great aunt that gives him the Xeloda for free. He will restart this today.   I talked to he and his wife about the liver biopsy. I told them the importance of the she neck studies. We know that his initial tumor was K-ras wild-type. This is why we are using the Erbitux. I will like to think that once he is back on the Xeloda, his CEA become back down.   His appetite is quite good. His weight is up a little bit. He has had no nausea or vomiting. He's had no diarrhea. He's had no mouth sores.  Overall, his performance status is ECOG 1 . Medications: ( Allergies as of 06/18/2016      Reactions   Bactrim [sulfamethoxazole-trimethoprim] Diarrhea   Has taken 2 different times and both times frequent diarrhea   Lyrica [pregabalin]    "thought I was going to die"   Zofran [ondansetron Hcl] Other (See Comments)   Hiccups      Medication List         Accurate as of 06/18/16 11:40 AM. Always use your most recent med list.          amLODipine-benazepril 5-20 MG capsule Commonly known as:  LOTREL Take 1 capsule by mouth daily.   capecitabine 500 MG tablet Commonly known as:  XELODA TAKE 3 TABLETS (1500 MG) BY MOUTH TWICE A DAY AFTER A MEAL FOR 14 DAYS ON, THEN 7 DAYS OFF.   Coenzyme Q10 10 MG capsule Take 10 mg by mouth daily.   doxycycline 100 MG tablet Commonly known as:  VIBRA-TABS Take 1 tablet (100 mg total) by mouth 2 (two) times daily.   ERBITUX IV Inject into the vein every 30 (thirty) days. Pt takes every 4 weeks; last dose 05/21/2016   gabapentin 100 MG capsule Commonly known as:  NEURONTIN Take 2 capsules (200 mg total) by mouth 3 (three) times daily. Pt okay to take only once a day   ibuprofen 200 MG tablet Commonly known as:  ADVIL,MOTRIN Take 200-400 mg by mouth every 6 (six) hours as needed.   lidocaine-prilocaine cream Commonly known as:  EMLA Apply 1 application topically as needed. Apply quarter sized amount to portacath site 1-2 hours prior to chemotherapy appt.  Cover with saran wrap.   LORazepam 1 MG tablet Commonly known as:  ATIVAN Take  1 tablet (1 mg total) by mouth every 6 (six) hours as needed (NAUSEA).   oxyCODONE-acetaminophen 5-325 MG tablet Commonly known as:  ROXICET Take 1-2 tablets by mouth every 8 (eight) hours as needed for severe pain.   oxymetazoline 0.05 % nasal spray Commonly known as:  AFRIN Place 1 spray into both nostrils 2 (two) times daily as needed for congestion.   traZODone 50 MG tablet Commonly known as:  DESYREL Take 1 tablet (50 mg total) by mouth at bedtime as needed for sleep.       Allergies:  Allergies  Allergen Reactions  . Bactrim [Sulfamethoxazole-Trimethoprim] Diarrhea    Has taken 2 different times and both times frequent diarrhea  . Lyrica [Pregabalin]     "thought I was going to die"  . Zofran [Ondansetron Hcl] Other (See Comments)    Hiccups     Past Medical History, Surgical history, Social history, and Family History were reviewed and updated.  Review of Systems: All other 10 point review of systems is negative.   Physical Exam:  vitals were not taken for this visit.  Wt Readings from Last 3 Encounters:  06/11/16 198 lb (89.8 kg)  05/21/16 201 lb (91.2 kg)  04/23/16 200 lb (90.7 kg)    Well-developed and well-nourished white male in no obvious distress. Head and neck exam shows no ocular or oral lesions. There are no palpable cervical or supraclavicular lymph nodes. Lungs are clear. Cardiac exam regular rate and rhythm with no murmurs, rubs or bruits. Abdomen is soft. There may be some slight distention. Bowel sounds are present. He has no guarding or rebound tenderness. No obvious abdominal masses noted. He has no hepatomegaly. Back exam shows no tenderness over the spine, ribs or hips. Externally shows no clubbing, cyanosis or edema. There may be some slight swelling in the left knee. He has good range of motion of the knee. Skin exam shows no rashes, ecchymosis or petechia. Neurological exam is nonfocal.   Lab Results  Component Value Date   WBC 5.8 06/18/2016   HGB 17.1 06/18/2016   HCT 47.9 06/18/2016   MCV 93 06/18/2016   PLT 150 06/18/2016   No results found for: FERRITIN, IRON, TIBC, UIBC, IRONPCTSAT Lab Results  Component Value Date   RBC 5.16 06/18/2016   No results found for: KPAFRELGTCHN, LAMBDASER, KAPLAMBRATIO No results found for: IGGSERUM, IGA, IGMSERUM No results found for: Odetta Pink, SPEI   Chemistry      Component Value Date/Time   NA 137 06/18/2016 1051   NA 140 12/26/2015 1013   K 3.7 06/18/2016 1051   K 4.1 12/26/2015 1013   CL 101 06/18/2016 1051   CO2 28 06/18/2016 1051   CO2 24 12/26/2015 1013   BUN 10 06/18/2016 1051   BUN 16.8 12/26/2015 1013   CREATININE 0.8 06/18/2016 1051   CREATININE 0.8 12/26/2015 1013       Component Value Date/Time   CALCIUM 9.4 06/18/2016 1051   CALCIUM 9.3 12/26/2015 1013   ALKPHOS 123 (H) 06/18/2016 1051   ALKPHOS 109 12/26/2015 1013   AST 32 06/18/2016 1051   AST 28 12/26/2015 1013   ALT 41 06/18/2016 1051   ALT 30 12/26/2015 1013   BILITOT 1.00 06/18/2016 1051   BILITOT 0.77 12/26/2015 1013     Impression and Plan: Ryan Maynard is 66 year old gentleman with metastatic colon cancer.   For now, we will proceed with theXeloda/Erbitux. Now that he has the Xeloda  covered by insurance and by the [place], everything should be better.   I told him about the chance of him being over use immunotherapy. I think the chance of his tumor having significant genetic instability probably will be less than 10%. However, I think it is definitely worthwhile looking into.   I'm so happy that he actually looks as good as he ties.  We will keep his appointments as scheduled for right now. I will plan to get him back in another month, or sooner if we need to make a change with treatment.   I spent about 30 minutes with he and his wife.   Volanda Napoleon, MD 1/16/201811:40 AM

## 2016-06-21 ENCOUNTER — Ambulatory Visit: Payer: Medicare Other

## 2016-06-21 ENCOUNTER — Ambulatory Visit: Payer: Medicare Other | Admitting: Hematology & Oncology

## 2016-06-21 ENCOUNTER — Other Ambulatory Visit: Payer: Medicare Other

## 2016-07-01 ENCOUNTER — Encounter (HOSPITAL_COMMUNITY): Payer: Self-pay

## 2016-07-03 ENCOUNTER — Other Ambulatory Visit (HOSPITAL_COMMUNITY)
Admission: RE | Admit: 2016-07-03 | Discharge: 2016-07-03 | Disposition: A | Discharge status: Home / Self Care | Payer: Medicare Other | Source: Ambulatory Visit | Attending: Hematology & Oncology | Admitting: Hematology & Oncology

## 2016-07-03 DIAGNOSIS — C189 Malignant neoplasm of colon, unspecified: Secondary | ICD-10-CM | POA: Diagnosis not present

## 2016-07-16 ENCOUNTER — Ambulatory Visit (HOSPITAL_BASED_OUTPATIENT_CLINIC_OR_DEPARTMENT_OTHER): Payer: Medicare Other

## 2016-07-16 ENCOUNTER — Other Ambulatory Visit (HOSPITAL_BASED_OUTPATIENT_CLINIC_OR_DEPARTMENT_OTHER): Payer: Medicare Other

## 2016-07-16 ENCOUNTER — Ambulatory Visit (HOSPITAL_BASED_OUTPATIENT_CLINIC_OR_DEPARTMENT_OTHER): Payer: Medicare Other | Admitting: Hematology & Oncology

## 2016-07-16 VITALS — BP 156/82 | HR 83 | Temp 98.0°F | Wt 202.8 lb

## 2016-07-16 DIAGNOSIS — C787 Secondary malignant neoplasm of liver and intrahepatic bile duct: Secondary | ICD-10-CM

## 2016-07-16 DIAGNOSIS — C19 Malignant neoplasm of rectosigmoid junction: Secondary | ICD-10-CM | POA: Diagnosis present

## 2016-07-16 DIAGNOSIS — Z5112 Encounter for antineoplastic immunotherapy: Secondary | ICD-10-CM | POA: Diagnosis not present

## 2016-07-16 DIAGNOSIS — C189 Malignant neoplasm of colon, unspecified: Secondary | ICD-10-CM | POA: Diagnosis not present

## 2016-07-16 LAB — CBC WITH DIFFERENTIAL (CANCER CENTER ONLY)
BASO#: 0.1 10*3/uL (ref 0.0–0.2)
BASO%: 1.2 % (ref 0.0–2.0)
EOS%: 4.5 % (ref 0.0–7.0)
Eosinophils Absolute: 0.3 10*3/uL (ref 0.0–0.5)
HCT: 46.8 % (ref 38.7–49.9)
HGB: 16.8 g/dL (ref 13.0–17.1)
LYMPH#: 1.3 10*3/uL (ref 0.9–3.3)
LYMPH%: 21.4 % (ref 14.0–48.0)
MCH: 33.3 pg (ref 28.0–33.4)
MCHC: 35.9 g/dL (ref 32.0–35.9)
MCV: 93 fL (ref 82–98)
MONO#: 0.7 10*3/uL (ref 0.1–0.9)
MONO%: 11.7 % (ref 0.0–13.0)
NEUT#: 3.6 10*3/uL (ref 1.5–6.5)
NEUT%: 61.2 % (ref 40.0–80.0)
Platelets: 142 10*3/uL — ABNORMAL LOW (ref 145–400)
RBC: 5.05 10*6/uL (ref 4.20–5.70)
RDW: 14.6 % (ref 11.1–15.7)
WBC: 5.8 10*3/uL (ref 4.0–10.0)

## 2016-07-16 LAB — LACTATE DEHYDROGENASE: LDH: 217 U/L (ref 125–245)

## 2016-07-16 LAB — CMP (CANCER CENTER ONLY)
ALT: 23 U/L (ref 10–47)
AST: 31 U/L (ref 11–38)
Albumin: 3.8 g/dL (ref 3.3–5.5)
Alkaline Phosphatase: 114 U/L — ABNORMAL HIGH (ref 26–84)
BUN: 9 mg/dL (ref 7–22)
CO2: 28 meq/L (ref 18–33)
Calcium: 9.6 mg/dL (ref 8.0–10.3)
Chloride: 103 mEq/L (ref 98–108)
Creat: 0.7 mg/dl (ref 0.6–1.2)
GLUCOSE: 102 mg/dL (ref 73–118)
POTASSIUM: 3.5 meq/L (ref 3.3–4.7)
Sodium: 140 mEq/L (ref 128–145)
Total Bilirubin: 1.1 mg/dl (ref 0.20–1.60)
Total Protein: 7.2 g/dL (ref 6.4–8.1)

## 2016-07-16 LAB — CEA (IN HOUSE-CHCC): CEA (CHCC-IN HOUSE): 199.17 ng/mL — AB (ref 0.00–5.00)

## 2016-07-16 LAB — MAGNESIUM: Magnesium: 1.9 mg/dl (ref 1.5–2.5)

## 2016-07-16 MED ORDER — DIPHENHYDRAMINE HCL 50 MG/ML IJ SOLN
50.0000 mg | Freq: Once | INTRAMUSCULAR | Status: AC
  Administered 2016-07-16: 50 mg via INTRAVENOUS

## 2016-07-16 MED ORDER — SODIUM CHLORIDE 0.9 % IV SOLN
Freq: Once | INTRAVENOUS | Status: AC
  Administered 2016-07-16: 11:00:00 via INTRAVENOUS

## 2016-07-16 MED ORDER — HEPARIN SOD (PORK) LOCK FLUSH 100 UNIT/ML IV SOLN
500.0000 [IU] | Freq: Once | INTRAVENOUS | Status: AC | PRN
  Administered 2016-07-16: 500 [IU]
  Filled 2016-07-16: qty 5

## 2016-07-16 MED ORDER — DIPHENHYDRAMINE HCL 50 MG/ML IJ SOLN
INTRAMUSCULAR | Status: AC
  Filled 2016-07-16: qty 1

## 2016-07-16 MED ORDER — SODIUM CHLORIDE 0.9% FLUSH
10.0000 mL | INTRAVENOUS | Status: DC | PRN
  Administered 2016-07-16: 10 mL
  Filled 2016-07-16: qty 10

## 2016-07-16 MED ORDER — CETUXIMAB CHEMO IV INJECTION 200 MG/100ML
500.0000 mg/m2 | Freq: Once | INTRAVENOUS | Status: AC
  Administered 2016-07-16: 1000 mg via INTRAVENOUS
  Filled 2016-07-16: qty 500

## 2016-07-16 MED FILL — CAPECITABINE 500 MG TABLET: 500 | 21 days supply | Qty: 84 | Fill #2

## 2016-07-16 NOTE — Patient Instructions (Signed)
Cetuximab injection What is this medicine? CETUXIMAB (se TUX i mab) is a monoclonal antibody. It is used to treat colorectal cancer and head and neck cancer. This medicine may be used for other purposes; ask your health care provider or pharmacist if you have questions. COMMON BRAND NAME(S): Erbitux What should I tell my health care provider before I take this medicine? They need to know if you have any of these conditions: -heart disease -history of irregular heartbeat -history of low levels of calcium, magnesium, or potassium in the blood -lung or breathing disease, like asthma -an unusual or allergic reaction to cetuximab, other medicines, foods, dyes, or preservatives -pregnant or trying to get pregnant -breast-feeding How should I use this medicine? This drug is given as an infusion into a vein. It is administered in a hospital or clinic by a specially trained health care professional. Talk to your pediatrician regarding the use of this medicine in children. Special care may be needed. Overdosage: If you think you have taken too much of this medicine contact a poison control center or emergency room at once. NOTE: This medicine is only for you. Do not share this medicine with others. What if I miss a dose? It is important not to miss your dose. Call your doctor or health care professional if you are unable to keep an appointment. What may interact with this medicine? Interactions are not expected. This list may not describe all possible interactions. Give your health care provider a list of all the medicines, herbs, non-prescription drugs, or dietary supplements you use. Also tell them if you smoke, drink alcohol, or use illegal drugs. Some items may interact with your medicine. What should I watch for while using this medicine? Visit your doctor or health care professional for regular checks on your progress. This drug may make you feel generally unwell. This is not uncommon, as  chemotherapy can affect healthy cells as well as cancer cells. Report any side effects. Continue your course of treatment even though you feel ill unless your doctor tells you to stop. This medicine can make you more sensitive to the sun. Keep out of the sun while taking this medicine and for 2 months after the last dose. If you cannot avoid being in the sun, wear protective clothing and use sunscreen. Do not use sun lamps or tanning beds/booths. You may need blood work done while you are taking this medicine. In some cases, you may be given additional medicines to help with side effects. Follow all directions for their use. Call your doctor or health care professional for advice if you get a fever, chills or sore throat, or other symptoms of a cold or flu. Do not treat yourself. This drug decreases your body's ability to fight infections. Try to avoid being around people who are sick. Avoid taking products that contain aspirin, acetaminophen, ibuprofen, naproxen, or ketoprofen unless instructed by your doctor. These medicines may hide a fever. Do not become pregnant while taking this medicine. Women should inform their doctor if they wish to become pregnant or think they might be pregnant. There is a potential for serious side effects to an unborn child. Use adequate birth control methods. Avoid pregnancy for at least 6 months after your last dose. Talk to your health care professional or pharmacist for more information. Do not breast-feed an infant while taking this medicine or during the 2 months after your last dose. What side effects may I notice from receiving this medicine? Side effects that  you should report to your doctor or health care professional as soon as possible: -allergic reactions like skin rash, itching or hives, swelling of the face, lips, or tongue -breathing problems -changes in vision -fast, irregular heartbeat -feeling faint or lightheaded, falls -fever, chills -mouth  sores -redness, blistering, peeling or loosening of the skin, including inside the mouth -trouble passing urine or change in the amount of urine -unusually weak or tired Side effects that usually do not require medical attention (report to your doctor or health care professional if they continue or are bothersome): -changes in skin like acne, cracks, skin dryness -constipation -diarrhea -headache -nail changes -nausea, vomiting -stomach upset -weight loss This list may not describe all possible side effects. Call your doctor for medical advice about side effects. You may report side effects to FDA at 1-800-FDA-1088. Where should I keep my medicine? This drug is given in a hospital or clinic and will not be stored at home. NOTE: This sheet is a summary. It may not cover all possible information. If you have questions about this medicine, talk to your doctor, pharmacist, or health care provider.  2017 [employer]vier/Gold Standard (2014-07-27 22:27:08)

## 2016-07-16 NOTE — Progress Notes (Signed)
Hematology and Oncology Follow Up Visit  Ryan [place]nard [id number] [birth year] 66 y.o. 07/16/2016   Principle Diagnosis:  Metastatic colon cancer - K-RAS wild-type - progressive  Current Therapy:   XELOX/Avastin s/p cycle 5 XELIRI/Erbitux - s/p c#10  (28 day cycles) - omit the Irinotecan;  Status post RFA of liver metastases    Interim History:  [place] is here today for follow-up. He looks quite good. He did well with the Xeloda. We will to get Xeloda for him. I don't think he has to pay anything for it.  His CEA has gone up. I think a lot of this was because we cannot get Xeloda for him. Hopefully, we will see that the CEA will start to come down a little bit.  The good news is that on his liver biopsy, he is HER-2 positive. He is wild-type for K-ras and BRAF.  I talked he is wife at length. I told him that there is a clinical trial at [facility] that he probably would qualify for. I have spoken with Dr. Sigurd Sos. She says that he probably would qualify. I think this should be a very good option for him. I think that he would respond. I think he would do well.  He really wants to think about this.  Otherwise, he is doing all right. He has had a good appetite. He's gained some weight. He's had no cough. He's had the rash from Erbitux but this is not too troublesome.  I talked to him diarrhea. He's had no mouth sores.  Overall, his performance status is ECOG 1 . Medications: ( Allergies as of 07/16/2016      Reactions   Bactrim [sulfamethoxazole-trimethoprim] Diarrhea   Has taken 2 different times and both times frequent diarrhea   Lyrica [pregabalin]    "thought I was going to die"   Zofran [ondansetron Hcl] Other (See Comments)   Hiccups      Medication List       Accurate as of 07/16/16 10:26 AM. Always use your most recent med list.          amLODipine-benazepril 5-20 MG capsule Commonly known as:  LOTREL Take 1 capsule by mouth daily.   capecitabine 500 MG  tablet Commonly known as:  XELODA TAKE 3 TABLETS (1500 MG) BY MOUTH TWICE A DAY AFTER A MEAL FOR 14 DAYS ON, THEN 7 DAYS OFF.   Coenzyme Q10 10 MG capsule Take 10 mg by mouth daily.   doxycycline 100 MG tablet Commonly known as:  VIBRA-TABS Take 1 tablet (100 mg total) by mouth 2 (two) times daily.   ERBITUX IV Inject into the vein every 30 (thirty) days. Pt takes every 4 weeks; last dose 05/21/2016   gabapentin 100 MG capsule Commonly known as:  NEURONTIN Take 2 capsules (200 mg total) by mouth 3 (three) times daily. Pt okay to take only once a day   ibuprofen 200 MG tablet Commonly known as:  ADVIL,MOTRIN Take 200-400 mg by mouth every 6 (six) hours as needed.   lidocaine-prilocaine cream Commonly known as:  EMLA Apply 1 application topically as needed. Apply quarter sized amount to portacath site 1-2 hours prior to chemotherapy appt.  Cover with saran wrap.   LORazepam 1 MG tablet Commonly known as:  ATIVAN Take 1 tablet (1 mg total) by mouth every 6 (six) hours as needed (NAUSEA).   oxyCODONE-acetaminophen 5-325 MG tablet Commonly known as:  ROXICET Take 1-2 tablets by mouth every 8 (eight) hours as needed  for severe pain.   oxymetazoline 0.05 % nasal spray Commonly known as:  AFRIN Place 1 spray into both nostrils 2 (two) times daily as needed for congestion.   traZODone 50 MG tablet Commonly known as:  DESYREL Take 1 tablet (50 mg total) by mouth at bedtime as needed for sleep.       Allergies:  Allergies  Allergen Reactions  . Bactrim [Sulfamethoxazole-Trimethoprim] Diarrhea    Has taken 2 different times and both times frequent diarrhea  . Lyrica [Pregabalin]     "thought I was going to die"  . Zofran [Ondansetron Hcl] Other (See Comments)    Hiccups    Past Medical History, Surgical history, Social history, and Family History were reviewed and updated.  Review of Systems: All other 10 point review of systems is negative.   Physical Exam:  weight  is 202 lb 12.8 oz (92 kg). His oral temperature is 98 F (36.7 C). His blood pressure is 156/82 (abnormal) and his pulse is 83.   Wt Readings from Last 3 Encounters:  07/16/16 202 lb 12.8 oz (92 kg)  06/18/16 202 lb (91.6 kg)  06/11/16 198 lb (89.8 kg)    Well-developed and well-nourished white male in no obvious distress. Head and neck exam shows no ocular or oral lesions. There are no palpable cervical or supraclavicular lymph nodes. Lungs are clear. Cardiac exam regular rate and rhythm with no murmurs, rubs or bruits. Abdomen is soft. There may be some slight distention. Bowel sounds are present. He has no guarding or rebound tenderness. No obvious abdominal masses noted. He has no hepatomegaly. Back exam shows no tenderness over the spine, ribs or hips. Externally shows no clubbing, cyanosis or edema. There may be some slight swelling in the left knee. He has good range of motion of the knee. Skin exam shows no rashes, ecchymosis or petechia. Neurological exam is nonfocal.   Lab Results  Component Value Date   WBC 5.8 07/16/2016   HGB 16.8 07/16/2016   HCT 46.8 07/16/2016   MCV 93 07/16/2016   PLT 142 (L) 07/16/2016   No results found for: FERRITIN, IRON, TIBC, UIBC, IRONPCTSAT Lab Results  Component Value Date   RBC 5.05 07/16/2016   No results found for: KPAFRELGTCHN, LAMBDASER, KAPLAMBRATIO No results found for: IGGSERUM, IGA, IGMSERUM No results found for: Odetta Pink, SPEI   Chemistry      Component Value Date/Time   NA 140 07/16/2016 0951   NA 140 12/26/2015 1013   K 3.5 07/16/2016 0951   K 4.1 12/26/2015 1013   CL 103 07/16/2016 0951   CO2 28 07/16/2016 0951   CO2 24 12/26/2015 1013   BUN 9 07/16/2016 0951   BUN 16.8 12/26/2015 1013   CREATININE 0.7 07/16/2016 0951   CREATININE 0.8 12/26/2015 1013      Component Value Date/Time   CALCIUM 9.6 07/16/2016 0951   CALCIUM 9.3 12/26/2015 1013   ALKPHOS 114 (H)  07/16/2016 0951   ALKPHOS 109 12/26/2015 1013   AST 31 07/16/2016 0951   AST 28 12/26/2015 1013   ALT 23 07/16/2016 0951   ALT 30 12/26/2015 1013   BILITOT 1.10 07/16/2016 0951   BILITOT 0.77 12/26/2015 1013     Impression and Plan: Ryan Maynard is 66 year old gentleman with metastatic colon cancer.   For now, we will proceed with the next cycle of Xeloda/Erbitux.  Hopefully, we will see that the CEA will come down.  He will  think about going [place] [facility] [place]. I told him that he is going to talk to the doctors. He does not have to make a commitment to them.  I spent about 30 minutes with he and his wife. I went over the genetic studies. I gave them copies of the reports.  The fact that his alkaline phosphatase is down a little bit might be assigned that things are starting to work again with Xeloda.   Volanda Napoleon, MD 2/13/201810:26 AM

## 2016-08-13 ENCOUNTER — Ambulatory Visit (HOSPITAL_BASED_OUTPATIENT_CLINIC_OR_DEPARTMENT_OTHER): Payer: Medicare Other | Admitting: Hematology & Oncology

## 2016-08-13 ENCOUNTER — Other Ambulatory Visit (HOSPITAL_BASED_OUTPATIENT_CLINIC_OR_DEPARTMENT_OTHER): Payer: Medicare Other

## 2016-08-13 ENCOUNTER — Encounter: Payer: Self-pay | Admitting: Hematology & Oncology

## 2016-08-13 ENCOUNTER — Ambulatory Visit (HOSPITAL_BASED_OUTPATIENT_CLINIC_OR_DEPARTMENT_OTHER): Payer: Medicare Other

## 2016-08-13 DIAGNOSIS — Z5112 Encounter for antineoplastic immunotherapy: Secondary | ICD-10-CM

## 2016-08-13 DIAGNOSIS — R970 Elevated carcinoembryonic antigen [CEA]: Secondary | ICD-10-CM | POA: Diagnosis not present

## 2016-08-13 DIAGNOSIS — C787 Secondary malignant neoplasm of liver and intrahepatic bile duct: Secondary | ICD-10-CM

## 2016-08-13 DIAGNOSIS — C189 Malignant neoplasm of colon, unspecified: Secondary | ICD-10-CM | POA: Diagnosis not present

## 2016-08-13 DIAGNOSIS — R112 Nausea with vomiting, unspecified: Secondary | ICD-10-CM

## 2016-08-13 LAB — CBC WITH DIFFERENTIAL (CANCER CENTER ONLY)
BASO#: 0.1 10*3/uL (ref 0.0–0.2)
BASO%: 1.3 % (ref 0.0–2.0)
EOS%: 5.1 % (ref 0.0–7.0)
Eosinophils Absolute: 0.4 10*3/uL (ref 0.0–0.5)
HCT: 46.6 % (ref 38.7–49.9)
HGB: 16.7 g/dL (ref 13.0–17.1)
LYMPH#: 1.6 10*3/uL (ref 0.9–3.3)
LYMPH%: 23.1 % (ref 14.0–48.0)
MCH: 33.4 pg (ref 28.0–33.4)
MCHC: 35.8 g/dL (ref 32.0–35.9)
MCV: 93 fL (ref 82–98)
MONO#: 0.8 10*3/uL (ref 0.1–0.9)
MONO%: 11.1 % (ref 0.0–13.0)
NEUT#: 4.1 10*3/uL (ref 1.5–6.5)
NEUT%: 59.4 % (ref 40.0–80.0)
PLATELETS: 134 10*3/uL — AB (ref 145–400)
RBC: 5 10*6/uL (ref 4.20–5.70)
RDW: 15.1 % (ref 11.1–15.7)
WBC: 6.9 10*3/uL (ref 4.0–10.0)

## 2016-08-13 LAB — CMP (CANCER CENTER ONLY)
ALBUMIN: 3.7 g/dL (ref 3.3–5.5)
ALT(SGPT): 24 U/L (ref 10–47)
AST: 29 U/L (ref 11–38)
Alkaline Phosphatase: 116 U/L — ABNORMAL HIGH (ref 26–84)
BUN: 11 mg/dL (ref 7–22)
CO2: 28 mEq/L (ref 18–33)
Calcium: 9.1 mg/dL (ref 8.0–10.3)
Chloride: 103 mEq/L (ref 98–108)
Creat: 0.9 mg/dl (ref 0.6–1.2)
Glucose, Bld: 96 mg/dL (ref 73–118)
POTASSIUM: 3.7 meq/L (ref 3.3–4.7)
Sodium: 141 mEq/L (ref 128–145)
TOTAL PROTEIN: 6.9 g/dL (ref 6.4–8.1)
Total Bilirubin: 1 mg/dl (ref 0.20–1.60)

## 2016-08-13 LAB — CEA (IN HOUSE-CHCC): CEA (CHCC-IN HOUSE): 235.19 ng/mL — AB (ref 0.00–5.00)

## 2016-08-13 MED ORDER — SODIUM CHLORIDE 0.9 % IV SOLN
Freq: Once | INTRAVENOUS | Status: AC
  Administered 2016-08-13: 11:00:00 via INTRAVENOUS

## 2016-08-13 MED ORDER — SODIUM CHLORIDE 0.9% FLUSH
10.0000 mL | INTRAVENOUS | Status: DC | PRN
  Administered 2016-08-13: 10 mL
  Filled 2016-08-13: qty 10

## 2016-08-13 MED ORDER — DIPHENHYDRAMINE HCL 50 MG/ML IJ SOLN
50.0000 mg | Freq: Once | INTRAMUSCULAR | Status: AC
  Administered 2016-08-13: 50 mg via INTRAVENOUS

## 2016-08-13 MED ORDER — DIPHENHYDRAMINE HCL 50 MG/ML IJ SOLN
INTRAMUSCULAR | Status: AC
  Filled 2016-08-13: qty 1

## 2016-08-13 MED ORDER — DOXYCYCLINE HYCLATE 100 MG PO TABS: 100.0000 mg | ORAL_TABLET | Freq: Two times a day (BID) | ORAL | 6 refills | Status: DC

## 2016-08-13 MED ORDER — CETUXIMAB CHEMO IV INJECTION 200 MG/100ML
500.0000 mg/m2 | Freq: Once | INTRAVENOUS | Status: AC
  Administered 2016-08-13: 1000 mg via INTRAVENOUS
  Filled 2016-08-13: qty 500

## 2016-08-13 MED ORDER — HEPARIN SOD (PORK) LOCK FLUSH 100 UNIT/ML IV SOLN
500.0000 [IU] | Freq: Once | INTRAVENOUS | Status: AC | PRN
  Administered 2016-08-13: 500 [IU]
  Filled 2016-08-13: qty 5

## 2016-08-13 MED FILL — CAPECITABINE 500 MG TABLET: 500 | 21 days supply | Qty: 84 | Fill #3

## 2016-08-13 NOTE — Progress Notes (Addendum)
Hematology and Oncology Follow Up Visit  Ryan Maynard [id number] [birth year] 66 y.o. 08/13/2016   Principle Diagnosis:  Metastatic colon cancer - K-RAS wild-type - progressive  Current Therapy:   XELOX/Avastin s/p cycle 5 XELIRI/Erbitux - s/p c#12  (28 day cycles) - omit the Irinotecan;  Status post RFA of liver metastases    Interim History:  Ryan Maynard is here today for follow-up. Unfortunately, his last CEA was up to 191. This is a ominous indicator for [place]. Maybe, we are seeing a delayed response to the Xeloda. We will see what his CEA level is.  I talked to he and his wife about going down to [facility]. He has agreed. I will make a call to [place].  He feels well. Unfortunately, his wife's brother died yesterday of cholangiocarcinoma. He only had his malignancy for 4 months.  Ryan Maynard has had no problems with bowels or bladder. He has had no cough. He has had no rashes. The Erbitux rash has not been all that bad.  He has had no leg swelling.  He has had no bleeding.  He has had no headaches. He has had no mouth sores. There has been no problem with his hands or feet while being on Xeloda.  Overall, his performance status is ECOG 1 . Medications: ( Allergies as of 08/13/2016      Reactions   Bactrim [sulfamethoxazole-trimethoprim] Diarrhea   Has taken 2 different times and both times frequent diarrhea   Lyrica [pregabalin]    "thought I was going to die"   Zofran [ondansetron Hcl] Other (See Comments)   Hiccups      Medication List       Accurate as of 08/13/16  9:41 AM. Always use your most recent med list.          amLODipine-benazepril 5-20 MG capsule Commonly known as:  LOTREL Take 1 capsule by mouth daily.   capecitabine 500 MG tablet Commonly known as:  XELODA TAKE 3 TABLETS (1500 MG) BY MOUTH TWICE A DAY AFTER A MEAL FOR 14 DAYS ON, THEN 7 DAYS OFF.   Coenzyme Q10 10 MG capsule Take 10 mg by mouth daily.   doxycycline 100 MG tablet Commonly  known as:  VIBRA-TABS Take 1 tablet (100 mg total) by mouth 2 (two) times daily.   ERBITUX IV Inject into the vein every 30 (thirty) days. Pt takes every 4 weeks; last dose 05/21/2016   gabapentin 100 MG capsule Commonly known as:  NEURONTIN Take 2 capsules (200 mg total) by mouth 3 (three) times daily. Pt okay to take only once a day   ibuprofen 200 MG tablet Commonly known as:  ADVIL,MOTRIN Take 200-400 mg by mouth every 6 (six) hours as needed.   lidocaine-prilocaine cream Commonly known as:  EMLA Apply 1 application topically as needed. Apply quarter sized amount to portacath site 1-2 hours prior to chemotherapy appt.  Cover with saran wrap.   LORazepam 1 MG tablet Commonly known as:  ATIVAN Take 1 tablet (1 mg total) by mouth every 6 (six) hours as needed (NAUSEA).   oxyCODONE-acetaminophen 5-325 MG tablet Commonly known as:  ROXICET Take 1-2 tablets by mouth every 8 (eight) hours as needed for severe pain.   oxymetazoline 0.05 % nasal spray Commonly known as:  AFRIN Place 1 spray into both nostrils 2 (two) times daily as needed for congestion.   traZODone 50 MG tablet Commonly known as:  DESYREL Take 1 tablet (50 mg total) by mouth at bedtime  as needed for sleep.       Allergies:  Allergies  Allergen Reactions  . Bactrim [Sulfamethoxazole-Trimethoprim] Diarrhea    Has taken 2 different times and both times frequent diarrhea  . Lyrica [Pregabalin]     "thought I was going to die"  . Zofran [Ondansetron Hcl] Other (See Comments)    Hiccups    Past Medical History, Surgical history, Social history, and Family History were reviewed and updated.  Review of Systems: All other 10 point review of systems is negative.   Physical Exam:  weight is 203 lb 6.4 oz (92.3 kg). His oral temperature is 98.3 F (36.8 C). His blood pressure is 158/85 (abnormal) and his pulse is 78. His respiration is 20 and oxygen saturation is 96%.   Wt Readings from Last 3 Encounters:    08/13/16 203 lb 6.4 oz (92.3 kg)  07/16/16 202 lb 12.8 oz (92 kg)  06/18/16 202 lb (91.6 kg)    Well-developed and well-nourished white male in no obvious distress. Head and neck exam shows no ocular or oral lesions. There are no palpable cervical or supraclavicular lymph nodes. Lungs are clear. Cardiac exam regular rate and rhythm with no murmurs, rubs or bruits. Abdomen is soft. There may be some slight distention. Bowel sounds are present. He has no guarding or rebound tenderness. No obvious abdominal masses noted. He has no hepatomegaly. Back exam shows no tenderness over the spine, ribs or hips. Externally shows no clubbing, cyanosis or edema. There may be some slight swelling in the left knee. He has good range of motion of the knee. Skin exam shows no rashes, ecchymosis or petechia. Neurological exam is nonfocal.   Lab Results  Component Value Date   WBC 6.9 08/13/2016   HGB 16.7 08/13/2016   HCT 46.6 08/13/2016   MCV 93 08/13/2016   PLT 134 (L) 08/13/2016   No results found for: FERRITIN, IRON, TIBC, UIBC, IRONPCTSAT Lab Results  Component Value Date   RBC 5.00 08/13/2016   No results found for: KPAFRELGTCHN, LAMBDASER, KAPLAMBRATIO No results found for: IGGSERUM, IGA, IGMSERUM No results found for: Odetta Pink, SPEI   Chemistry      Component Value Date/Time   NA 140 07/16/2016 0951   NA 140 12/26/2015 1013   K 3.5 07/16/2016 0951   K 4.1 12/26/2015 1013   CL 103 07/16/2016 0951   CO2 28 07/16/2016 0951   CO2 24 12/26/2015 1013   BUN 9 07/16/2016 0951   BUN 16.8 12/26/2015 1013   CREATININE 0.7 07/16/2016 0951   CREATININE 0.8 12/26/2015 1013      Component Value Date/Time   CALCIUM 9.6 07/16/2016 0951   CALCIUM 9.3 12/26/2015 1013   ALKPHOS 114 (H) 07/16/2016 0951   ALKPHOS 109 12/26/2015 1013   AST 31 07/16/2016 0951   AST 28 12/26/2015 1013   ALT 23 07/16/2016 0951   ALT 30 12/26/2015 1013   BILITOT  1.10 07/16/2016 0951   BILITOT 0.77 12/26/2015 1013     Impression and Plan: Ryan Maynard is 66 year old gentleman with metastatic colon cancer.   He does agree to go to [facility] to talk to the GI oncologist about a clinical trial. Hopefully, he will qualify. I couldn't think of a reason why he would not qualify for a clinical trial. I think the fact that he is HER-2 positive is significant for him. I told him that we, in the community, are not able to treat  HER-2-positive colon cancer because of FDA restrictions.  I offered my condolences to he and his wife for his wife's brother passing away yesterday. 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  I will plan to get him back here in 1 month as scheduled. If he is on clinical trial, then we can get him back as necessary.  We will see what his CEA level is.  We will go ahead with his 13th cycle of treatment.   Volanda Napoleon, MD 3/13/20189:41 AM  ADDENDUM:  I called pathology to have the specimen sent off for [facility] testing  I did speak with Dr. Eppie Gibson at [facility]. I gave her the information on Ryan Maynard. She will call him and get him set up to be seen.

## 2016-08-13 NOTE — Patient Instructions (Signed)
[facility] Discharge Instructions for Patients Receiving Chemotherapy  Today you received the following chemotherapy agents: Erbitux   To help prevent nausea and vomiting after your treatment, we encourage you to take your nausea medication as directed.    If you develop nausea and vomiting that is not controlled by your nausea medication, call the clinic.   BELOW ARE SYMPTOMS THAT SHOULD BE REPORTED IMMEDIATELY:  *FEVER GREATER THAN 100.5 F  *CHILLS WITH OR WITHOUT FEVER  NAUSEA AND VOMITING THAT IS NOT CONTROLLED WITH YOUR NAUSEA MEDICATION  *UNUSUAL SHORTNESS OF BREATH  *UNUSUAL BRUISING OR BLEEDING  TENDERNESS IN MOUTH AND THROAT WITH OR WITHOUT PRESENCE OF ULCERS  *URINARY PROBLEMS  *BOWEL PROBLEMS  UNUSUAL RASH Items with * indicate a potential emergency and should be followed up as soon as possible.  Feel free to call the clinic you have any questions or concerns. The clinic phone number is [phone].  Please show the CHEMO ALERT CARD at check-in to the Emergency Department and triage nurse.   

## 2016-08-19 DIAGNOSIS — C787 Secondary malignant neoplasm of liver and intrahepatic bile duct: Secondary | ICD-10-CM | POA: Diagnosis not present

## 2016-08-19 DIAGNOSIS — C189 Malignant neoplasm of colon, unspecified: Secondary | ICD-10-CM | POA: Diagnosis not present

## 2016-08-27 ENCOUNTER — Other Ambulatory Visit: Payer: Self-pay | Admitting: Hematology & Oncology

## 2016-08-27 ENCOUNTER — Other Ambulatory Visit: Payer: Self-pay | Admitting: *Deleted

## 2016-08-27 ENCOUNTER — Telehealth: Payer: Self-pay | Admitting: *Deleted

## 2016-08-27 DIAGNOSIS — G4700 Insomnia, unspecified: Secondary | ICD-10-CM

## 2016-08-27 DIAGNOSIS — C787 Secondary malignant neoplasm of liver and intrahepatic bile duct: Principal | ICD-10-CM

## 2016-08-27 DIAGNOSIS — C189 Malignant neoplasm of colon, unspecified: Secondary | ICD-10-CM

## 2016-08-27 MED ORDER — TRAZODONE HCL 50 MG PO TABS: 50.0000 mg | ORAL_TABLET | Freq: Every evening | ORAL | 3 refills | Status: DC | PRN

## 2016-08-27 NOTE — Telephone Encounter (Signed)
Patient was accepted into clinical trial at [facility]. He needs a baseline CT of CAP ordered by Dr Marin Olp. He would also like to know if he needs to keep his 09/10/16 appointment.  Dr Marin Olp placed orders. He does not feel its necessary to keep 4/10 appointment. Patient aware that this appointment was cancelled and order placed.

## 2016-09-09 ENCOUNTER — Ambulatory Visit (HOSPITAL_COMMUNITY)
Admission: RE | Admit: 2016-09-09 | Discharge: 2016-09-09 | Disposition: A | Discharge status: Home / Self Care | Payer: Medicare Other | Source: Ambulatory Visit | Attending: Hematology & Oncology | Admitting: Hematology & Oncology

## 2016-09-09 ENCOUNTER — Encounter (HOSPITAL_COMMUNITY): Payer: Self-pay

## 2016-09-09 DIAGNOSIS — C189 Malignant neoplasm of colon, unspecified: Secondary | ICD-10-CM

## 2016-09-09 DIAGNOSIS — I2510 Atherosclerotic heart disease of native coronary artery without angina pectoris: Secondary | ICD-10-CM | POA: Diagnosis not present

## 2016-09-09 DIAGNOSIS — R911 Solitary pulmonary nodule: Secondary | ICD-10-CM | POA: Insufficient documentation

## 2016-09-09 DIAGNOSIS — I700 Atherosclerosis of aorta: Secondary | ICD-10-CM | POA: Diagnosis not present

## 2016-09-09 DIAGNOSIS — N281 Cyst of kidney, acquired: Secondary | ICD-10-CM | POA: Insufficient documentation

## 2016-09-09 DIAGNOSIS — C787 Secondary malignant neoplasm of liver and intrahepatic bile duct: Secondary | ICD-10-CM | POA: Diagnosis not present

## 2016-09-09 DIAGNOSIS — K469 Unspecified abdominal hernia without obstruction or gangrene: Secondary | ICD-10-CM | POA: Insufficient documentation

## 2016-09-09 MED ORDER — IOPAMIDOL (ISOVUE-300) INJECTION 61%
100.0000 mL | Freq: Once | INTRAVENOUS | Status: AC | PRN
  Administered 2016-09-09: 100 mL via INTRAVENOUS

## 2016-09-09 MED ORDER — IOPAMIDOL (ISOVUE-300) INJECTION 61%
INTRAVENOUS | Status: AC
  Filled 2016-09-09: qty 30

## 2016-09-09 MED ORDER — IOPAMIDOL (ISOVUE-300) INJECTION 61%
30.0000 mL | Freq: Once | INTRAVENOUS | Status: AC | PRN
  Administered 2016-09-09: 30 mL via ORAL

## 2016-09-09 MED ORDER — IOPAMIDOL (ISOVUE-300) INJECTION 61%
INTRAVENOUS | Status: AC
  Filled 2016-09-09: qty 100

## 2016-09-10 ENCOUNTER — Other Ambulatory Visit: Payer: Medicare Other

## 2016-09-10 ENCOUNTER — Ambulatory Visit: Payer: Medicare Other

## 2016-09-10 ENCOUNTER — Ambulatory Visit: Payer: Medicare Other | Admitting: Hematology & Oncology

## 2016-09-13 DIAGNOSIS — C787 Secondary malignant neoplasm of liver and intrahepatic bile duct: Secondary | ICD-10-CM | POA: Diagnosis not present

## 2016-09-13 DIAGNOSIS — C189 Malignant neoplasm of colon, unspecified: Secondary | ICD-10-CM | POA: Diagnosis not present

## 2016-09-13 DIAGNOSIS — I10 Essential (primary) hypertension: Secondary | ICD-10-CM | POA: Diagnosis not present

## 2016-09-18 DIAGNOSIS — I10 Essential (primary) hypertension: Secondary | ICD-10-CM | POA: Diagnosis not present

## 2016-09-18 DIAGNOSIS — Z006 Encounter for examination for normal comparison and control in clinical research program: Secondary | ICD-10-CM | POA: Diagnosis not present

## 2016-09-18 DIAGNOSIS — Z5112 Encounter for antineoplastic immunotherapy: Secondary | ICD-10-CM | POA: Diagnosis not present

## 2016-09-18 DIAGNOSIS — C20 Malignant neoplasm of rectum: Secondary | ICD-10-CM | POA: Diagnosis not present

## 2016-09-18 DIAGNOSIS — C787 Secondary malignant neoplasm of liver and intrahepatic bile duct: Secondary | ICD-10-CM | POA: Diagnosis not present

## 2016-10-08 ENCOUNTER — Ambulatory Visit: Payer: Medicare Other

## 2016-10-08 ENCOUNTER — Other Ambulatory Visit: Payer: Medicare Other

## 2016-10-08 ENCOUNTER — Ambulatory Visit: Payer: Medicare Other | Admitting: Hematology & Oncology

## 2016-10-09 DIAGNOSIS — Z006 Encounter for examination for normal comparison and control in clinical research program: Secondary | ICD-10-CM | POA: Diagnosis not present

## 2016-10-09 DIAGNOSIS — I10 Essential (primary) hypertension: Secondary | ICD-10-CM | POA: Diagnosis not present

## 2016-10-09 DIAGNOSIS — C787 Secondary malignant neoplasm of liver and intrahepatic bile duct: Secondary | ICD-10-CM | POA: Diagnosis not present

## 2016-10-09 DIAGNOSIS — C19 Malignant neoplasm of rectosigmoid junction: Secondary | ICD-10-CM | POA: Diagnosis not present

## 2016-10-29 DIAGNOSIS — C189 Malignant neoplasm of colon, unspecified: Secondary | ICD-10-CM | POA: Diagnosis not present

## 2016-10-29 DIAGNOSIS — C787 Secondary malignant neoplasm of liver and intrahepatic bile duct: Secondary | ICD-10-CM | POA: Diagnosis not present

## 2016-10-29 DIAGNOSIS — C7800 Secondary malignant neoplasm of unspecified lung: Secondary | ICD-10-CM | POA: Diagnosis not present

## 2016-10-30 DIAGNOSIS — C787 Secondary malignant neoplasm of liver and intrahepatic bile duct: Secondary | ICD-10-CM | POA: Diagnosis not present

## 2016-10-30 DIAGNOSIS — Z006 Encounter for examination for normal comparison and control in clinical research program: Secondary | ICD-10-CM | POA: Diagnosis not present

## 2016-10-30 DIAGNOSIS — C20 Malignant neoplasm of rectum: Secondary | ICD-10-CM | POA: Diagnosis not present

## 2016-10-30 DIAGNOSIS — Z79899 Other long term (current) drug therapy: Secondary | ICD-10-CM | POA: Diagnosis not present

## 2016-10-30 DIAGNOSIS — C7800 Secondary malignant neoplasm of unspecified lung: Secondary | ICD-10-CM | POA: Diagnosis not present

## 2016-10-30 DIAGNOSIS — I10 Essential (primary) hypertension: Secondary | ICD-10-CM | POA: Diagnosis not present

## 2016-10-31 MED FILL — CAPECITABINE 500 MG TABLET: 500 | 21 days supply | Qty: 84 | Fill #4

## 2016-11-05 ENCOUNTER — Ambulatory Visit: Payer: Medicare Other | Admitting: Hematology & Oncology

## 2016-11-05 ENCOUNTER — Other Ambulatory Visit: Payer: Medicare Other

## 2016-11-05 ENCOUNTER — Ambulatory Visit: Payer: Medicare Other

## 2016-11-07 ENCOUNTER — Ambulatory Visit: Payer: Medicare Other

## 2016-11-07 ENCOUNTER — Other Ambulatory Visit (HOSPITAL_BASED_OUTPATIENT_CLINIC_OR_DEPARTMENT_OTHER): Payer: Medicare Other

## 2016-11-07 ENCOUNTER — Ambulatory Visit (HOSPITAL_BASED_OUTPATIENT_CLINIC_OR_DEPARTMENT_OTHER): Payer: Medicare Other | Admitting: Hematology & Oncology

## 2016-11-07 ENCOUNTER — Encounter: Payer: Self-pay | Admitting: Hematology & Oncology

## 2016-11-07 VITALS — BP 149/83 | HR 81 | Temp 98.4°F | Resp 16 | Wt 196.0 lb

## 2016-11-07 DIAGNOSIS — C787 Secondary malignant neoplasm of liver and intrahepatic bile duct: Secondary | ICD-10-CM | POA: Diagnosis not present

## 2016-11-07 DIAGNOSIS — Z7189 Other specified counseling: Secondary | ICD-10-CM

## 2016-11-07 DIAGNOSIS — C189 Malignant neoplasm of colon, unspecified: Secondary | ICD-10-CM

## 2016-11-07 DIAGNOSIS — R112 Nausea with vomiting, unspecified: Secondary | ICD-10-CM

## 2016-11-07 DIAGNOSIS — I10 Essential (primary) hypertension: Secondary | ICD-10-CM | POA: Diagnosis not present

## 2016-11-07 HISTORY — DX: Other specified counseling: Z71.89

## 2016-11-07 LAB — CMP (CANCER CENTER ONLY)
ALK PHOS: 187 U/L — AB (ref 26–84)
ALT(SGPT): 36 U/L (ref 10–47)
AST: 37 U/L (ref 11–38)
Albumin: 3.5 g/dL (ref 3.3–5.5)
BUN, Bld: 13 mg/dL (ref 7–22)
CALCIUM: 9.4 mg/dL (ref 8.0–10.3)
CHLORIDE: 107 meq/L (ref 98–108)
CO2: 24 mEq/L (ref 18–33)
Creat: 0.8 mg/dl (ref 0.6–1.2)
GLUCOSE: 148 mg/dL — AB (ref 73–118)
POTASSIUM: 3.9 meq/L (ref 3.3–4.7)
Sodium: 135 mEq/L (ref 128–145)
Total Bilirubin: 0.7 mg/dl (ref 0.20–1.60)
Total Protein: 7.1 g/dL (ref 6.4–8.1)

## 2016-11-07 LAB — CBC WITH DIFFERENTIAL (CANCER CENTER ONLY)
BASO#: 0.1 10*3/uL (ref 0.0–0.2)
BASO%: 1.2 % (ref 0.0–2.0)
EOS ABS: 0.2 10*3/uL (ref 0.0–0.5)
EOS%: 2.7 % (ref 0.0–7.0)
HCT: 45.8 % (ref 38.7–49.9)
HEMOGLOBIN: 15.7 g/dL (ref 13.0–17.1)
LYMPH#: 1 10*3/uL (ref 0.9–3.3)
LYMPH%: 17.5 % (ref 14.0–48.0)
MCH: 32 pg (ref 28.0–33.4)
MCHC: 34.3 g/dL (ref 32.0–35.9)
MCV: 93 fL (ref 82–98)
MONO#: 0.5 10*3/uL (ref 0.1–0.9)
MONO%: 7.8 % (ref 0.0–13.0)
NEUT#: 4.2 10*3/uL (ref 1.5–6.5)
NEUT%: 70.8 % (ref 40.0–80.0)
Platelets: 180 10*3/uL (ref 145–400)
RBC: 4.91 10*6/uL (ref 4.20–5.70)
RDW: 12.1 % (ref 11.1–15.7)
WBC: 5.9 10*3/uL (ref 4.0–10.0)

## 2016-11-07 LAB — CEA (IN HOUSE-CHCC): CEA (CHCC-In House): 711.86 ng/mL — ABNORMAL HIGH (ref 0.00–5.00)

## 2016-11-07 LAB — LACTATE DEHYDROGENASE: LDH: 273 U/L — ABNORMAL HIGH (ref 125–245)

## 2016-11-07 NOTE — Progress Notes (Signed)
Hematology and Oncology Follow Up Visit  Ryan Maynard [id number] [birth year] 66 y.o. 11/07/2016   Principle Diagnosis:  Metastatic colon cancer - K-RAS wild-type - progressive  Current Therapy:   XELOX/Avastin s/p cycle 5 - stopped on 06/30/2015 XELIRI/Erbitux - s/p c#12  (28 day cycles) - omit the Irinotecan;  Status post RFA of liver metastases Clinical Trial at [facility] - [id number] - Perjeta/Herceptin/Exp chemo XELOX/Avastin - re-trial to start 11/12/2016    Interim History:  Ryan Maynard is here today for follow-up. He has been at [facility] for the past several months. He was on experimental protocol. Unfortunately, his last studies showed that he was progressing. His CT scans showed progressive disease. He had progressive pulmonary and hepatic metastasis. His CEA was up to 700.  He is followed by Dr. Aleatha Borer. As always, she has done a fantastic job. She offered him a couple other clinical trials. He wanted to be closer to home.  I spoke with Dr. Aleatha Borer by phone. We decided that he him back on XELOX/Avastin would be reasonable. He has not been on this for probably a year or more. According to our records, his last cycle of this was back in January 2017. We both feel that he could respond.  He actually looks quite good. He feels good. He has had no abdominal pain. He's had no nausea or vomiting. He's had no cough. He's had no change in bowel or bladder habits.  As always, he comes in with his wife. She has done a great job with him.   He's had no bleeding. He's had no diarrhea. He has had no mouth sores.Marland Kitchen  Unfortunately, his CEA level today was 710.  Overall, his performance status is ECOG 1 . Medications: ( Allergies as of 11/07/2016      Reactions   Bactrim [sulfamethoxazole-trimethoprim] Diarrhea   Has taken 2 different times and both times frequent diarrhea   Lyrica [pregabalin]    "thought I was going to die"   Zofran [ondansetron Hcl] Other (See Comments)   Hiccups        Medication List       Accurate as of 11/07/16  5:50 PM. Always use your most recent med list.          amLODipine-benazepril 5-20 MG capsule Commonly known as:  LOTREL Take 1 capsule by mouth daily.   capecitabine 500 MG tablet Commonly known as:  XELODA TAKE 3 TABLETS (1500 MG) BY MOUTH TWICE A DAY AFTER A MEAL FOR 14 DAYS ON, THEN 7 DAYS OFF.   Coenzyme Q10 10 MG capsule Take 10 mg by mouth daily.   doxycycline 100 MG tablet Commonly known as:  VIBRA-TABS Take 1 tablet (100 mg total) by mouth 2 (two) times daily.   ERBITUX IV Inject into the vein every 30 (thirty) days. Pt takes every 4 weeks; last dose 05/21/2016   gabapentin 100 MG capsule Commonly known as:  NEURONTIN Take 2 capsules (200 mg total) by mouth 3 (three) times daily. Pt okay to take only once a day   ibuprofen 200 MG tablet Commonly known as:  ADVIL,MOTRIN Take 200-400 mg by mouth every 6 (six) hours as needed.   lidocaine-prilocaine cream Commonly known as:  EMLA Apply 1 application topically as needed. Apply quarter sized amount to portacath site 1-2 hours prior to chemotherapy appt.  Cover with saran wrap.   LORazepam 1 MG tablet Commonly known as:  ATIVAN Take 1 tablet (1 mg total) by mouth every 6 (six) hours  as needed (NAUSEA).   oxyCODONE-acetaminophen 5-325 MG tablet Commonly known as:  ROXICET Take 1-2 tablets by mouth every 8 (eight) hours as needed for severe pain.   oxymetazoline 0.05 % nasal spray Commonly known as:  AFRIN Place 1 spray into both nostrils 2 (two) times daily as needed for congestion.   traZODone 50 MG tablet Commonly known as:  DESYREL Take 1 tablet (50 mg total) by mouth at bedtime as needed for sleep.       Allergies:  Allergies  Allergen Reactions  . Bactrim [Sulfamethoxazole-Trimethoprim] Diarrhea    Has taken 2 different times and both times frequent diarrhea  . Lyrica [Pregabalin]     "thought I was going to die"  . Zofran [Ondansetron Hcl] Other  (See Comments)    Hiccups    Past Medical History, Surgical history, Social history, and Family History were reviewed and updated.  Review of Systems: All other 10 point review of systems is negative.   Physical Exam:  weight is 196 lb (88.9 kg). His oral temperature is 98.4 F (36.9 C). His blood pressure is 149/83 (abnormal) and his pulse is 81. His respiration is 16 and oxygen saturation is 98%.   Wt Readings from Last 3 Encounters:  11/07/16 196 lb (88.9 kg)  08/13/16 203 lb 6.4 oz (92.3 kg)  07/16/16 202 lb 12.8 oz (92 kg)    Well-developed and well-nourished white male in no obvious distress. Head and neck exam shows no ocular or oral lesions. There are no palpable cervical or supraclavicular lymph nodes. Lungs are clear. Cardiac exam regular rate and rhythm with no murmurs, rubs or bruits. Abdomen is soft. There may be some slight distention. Bowel sounds are present. He has no guarding or rebound tenderness. No obvious abdominal masses noted. He has no hepatomegaly. Back exam shows no tenderness over the spine, ribs or hips. Externally shows no clubbing, cyanosis or edema. There may be some slight swelling in the left knee. He has good range of motion of the knee. Skin exam shows no rashes, ecchymosis or petechia. Neurological exam is nonfocal.   Lab Results  Component Value Date   WBC 5.9 11/07/2016   HGB 15.7 11/07/2016   HCT 45.8 11/07/2016   MCV 93 11/07/2016   PLT 180 11/07/2016   No results found for: FERRITIN, IRON, TIBC, UIBC, IRONPCTSAT Lab Results  Component Value Date   RBC 4.91 11/07/2016   No results found for: KPAFRELGTCHN, LAMBDASER, KAPLAMBRATIO No results found for: Kandis Cocking, IGMSERUM No results found for: Odetta Pink, SPEI   Chemistry      Component Value Date/Time   NA 135 11/07/2016 1012   NA 140 12/26/2015 1013   K 3.9 11/07/2016 1012   K 4.1 12/26/2015 1013   CL 107 11/07/2016 1012    CO2 24 11/07/2016 1012   CO2 24 12/26/2015 1013   BUN 13 11/07/2016 1012   BUN 16.8 12/26/2015 1013   CREATININE 0.8 11/07/2016 1012   CREATININE 0.8 12/26/2015 1013      Component Value Date/Time   CALCIUM 9.4 11/07/2016 1012   CALCIUM 9.3 12/26/2015 1013   ALKPHOS 187 (H) 11/07/2016 1012   ALKPHOS 109 12/26/2015 1013   AST 37 11/07/2016 1012   AST 28 12/26/2015 1013   ALT 36 11/07/2016 1012   ALT 30 12/26/2015 1013   BILITOT 0.70 11/07/2016 1012   BILITOT 0.77 12/26/2015 1013     Impression and Plan: Ryan. Ticas is  66 year old gentleman with metastatic colon cancer.   He has always wanted quality of life as the primary goal. I totally agree with this. He wants to be as functional as possible. He wants to be able to go on vacation. He was going to most hard and work in the yard. So far, he has done this.  We will have him retry XELOX/Avastin. I think this would be reasonable. He had some toxicity with this and not so much progression.  I think that the chance of responding should be about 50%. His CEA level always tells [place] how he is doing.  He knows very well this side effects of treatment. He knows about the risk with Avastin with hypertension, proteinuria, possibility of thromboembolic disease. With oxaliplatin, he knows about the neuropathy.  With Xeloda, he knows that he has to wear sunscreen and protective skin quite well during the summertime because of the increased sun sensitivity.   I spent about 40 minutes with he and his wife. It was nice to see him again. He certainly understands well that this is something that is not curable and again, her goal is palliation and minimize issue of any symptoms.  We will be started next week. I will see him back in early July for his second cycle of treatment.Volanda Napoleon, MD 6/7/20185:50 PM

## 2016-11-07 NOTE — Progress Notes (Signed)
START ON PATHWAY REGIMEN - Colorectal     A cycle is every 21 days:     Capecitabine      Oxaliplatin      Bevacizumab   **Always confirm dose/schedule in your pharmacy ordering system**    Patient Characteristics: Metastatic Colorectal, Second Line, KRAS Mutation Positive/Unknown, BRAF Mutation Positive, Bevacizumab Eligible Current evidence of distant metastases? Yes AJCC T Category: TX AJCC N Category: NX AJCC M Category: M1c AJCC 8 Stage Grouping: IVC BRAF Mutation Status: Mutation Positive KRAS/NRAS Mutation Status: Mutation Positive Line of therapy: Second Line Would you be surprised if this patient died  in the next year? I would be surprised if this patient died in the next year Intent of Therapy: Non-Curative / Palliative Intent, Discussed with Patient

## 2016-11-12 ENCOUNTER — Other Ambulatory Visit: Payer: Self-pay | Admitting: Family

## 2016-11-12 ENCOUNTER — Ambulatory Visit (HOSPITAL_BASED_OUTPATIENT_CLINIC_OR_DEPARTMENT_OTHER): Payer: Medicare Other

## 2016-11-12 VITALS — BP 154/84 | HR 82 | Temp 98.0°F

## 2016-11-12 DIAGNOSIS — C189 Malignant neoplasm of colon, unspecified: Secondary | ICD-10-CM

## 2016-11-12 DIAGNOSIS — Z79899 Other long term (current) drug therapy: Secondary | ICD-10-CM

## 2016-11-12 DIAGNOSIS — C787 Secondary malignant neoplasm of liver and intrahepatic bile duct: Secondary | ICD-10-CM

## 2016-11-12 DIAGNOSIS — Z5112 Encounter for antineoplastic immunotherapy: Secondary | ICD-10-CM | POA: Diagnosis not present

## 2016-11-12 DIAGNOSIS — Z5111 Encounter for antineoplastic chemotherapy: Secondary | ICD-10-CM | POA: Diagnosis not present

## 2016-11-12 LAB — UA PROTEIN, DIPSTICK - CHCC SATELLITE: Protein, Urine: NEGATIVE mg/dL

## 2016-11-12 MED ORDER — DEXTROSE 5 % IV SOLN
Freq: Once | INTRAVENOUS | Status: AC
  Administered 2016-11-12: 11:00:00 via INTRAVENOUS

## 2016-11-12 MED ORDER — OXALIPLATIN CHEMO INJECTION 100 MG/20ML
104.0000 mg/m2 | Freq: Once | INTRAVENOUS | Status: AC
  Administered 2016-11-12: 210 mg via INTRAVENOUS
  Filled 2016-11-12: qty 35

## 2016-11-12 MED ORDER — PALONOSETRON HCL INJECTION 0.25 MG/5ML
0.2500 mg | Freq: Once | INTRAVENOUS | Status: AC
  Administered 2016-11-12: 0.25 mg via INTRAVENOUS

## 2016-11-12 MED ORDER — PALONOSETRON HCL INJECTION 0.25 MG/5ML
INTRAVENOUS | Status: AC
  Filled 2016-11-12: qty 5

## 2016-11-12 MED ORDER — DEXAMETHASONE SODIUM PHOSPHATE 10 MG/ML IJ SOLN
INTRAMUSCULAR | Status: AC
  Filled 2016-11-12: qty 1

## 2016-11-12 MED ORDER — SODIUM CHLORIDE 0.9 % IV SOLN
Freq: Once | INTRAVENOUS | Status: AC
  Administered 2016-11-12: 13:00:00 via INTRAVENOUS

## 2016-11-12 MED ORDER — SODIUM CHLORIDE 0.9% FLUSH
10.0000 mL | INTRAVENOUS | Status: DC | PRN
  Administered 2016-11-12: 10 mL
  Filled 2016-11-12: qty 10

## 2016-11-12 MED ORDER — HEPARIN SOD (PORK) LOCK FLUSH 100 UNIT/ML IV SOLN
500.0000 [IU] | Freq: Once | INTRAVENOUS | Status: AC | PRN
Start: 2016-11-12 — End: 2016-11-12
  Administered 2016-11-12: 500 [IU]
  Filled 2016-11-12: qty 5

## 2016-11-12 MED ORDER — DEXAMETHASONE SODIUM PHOSPHATE 10 MG/ML IJ SOLN
10.0000 mg | Freq: Once | INTRAMUSCULAR | Status: AC
  Administered 2016-11-12: 10 mg via INTRAVENOUS

## 2016-11-12 MED ORDER — SODIUM CHLORIDE 0.9 % IV SOLN
14.7000 mg/kg | Freq: Once | INTRAVENOUS | Status: AC
  Administered 2016-11-12: 1300 mg via INTRAVENOUS
  Filled 2016-11-12: qty 48

## 2016-11-12 NOTE — Patient Instructions (Signed)
[facility] Discharge Instructions for Patients Receiving Chemotherapy  Today you received the following chemotherapy agents Oxaliplatin, Avastin   To help prevent nausea and vomiting after your treatment, we encourage you to take your nausea medication    If you develop nausea and vomiting that is not controlled by your nausea medication, call the clinic.   BELOW ARE SYMPTOMS THAT SHOULD BE REPORTED IMMEDIATELY:  *FEVER GREATER THAN 100.5 F  *CHILLS WITH OR WITHOUT FEVER  NAUSEA AND VOMITING THAT IS NOT CONTROLLED WITH YOUR NAUSEA MEDICATION  *UNUSUAL SHORTNESS OF BREATH  *UNUSUAL BRUISING OR BLEEDING  TENDERNESS IN MOUTH AND THROAT WITH OR WITHOUT PRESENCE OF ULCERS  *URINARY PROBLEMS  *BOWEL PROBLEMS  UNUSUAL RASH Items with * indicate a potential emergency and should be followed up as soon as possible.  Feel free to call the clinic you have any questions or concerns. The clinic phone number is [phone].  Please show the [facility] at check-in to the Emergency Department and triage nurse.

## 2016-12-03 ENCOUNTER — Ambulatory Visit (HOSPITAL_BASED_OUTPATIENT_CLINIC_OR_DEPARTMENT_OTHER): Payer: Medicare Other

## 2016-12-03 ENCOUNTER — Other Ambulatory Visit (HOSPITAL_BASED_OUTPATIENT_CLINIC_OR_DEPARTMENT_OTHER): Payer: Medicare Other

## 2016-12-03 ENCOUNTER — Ambulatory Visit: Payer: Medicare Other | Admitting: Hematology & Oncology

## 2016-12-03 ENCOUNTER — Ambulatory Visit: Payer: Medicare Other

## 2016-12-03 ENCOUNTER — Other Ambulatory Visit: Payer: Medicare Other

## 2016-12-03 ENCOUNTER — Ambulatory Visit (HOSPITAL_BASED_OUTPATIENT_CLINIC_OR_DEPARTMENT_OTHER): Payer: Medicare Other | Admitting: Hematology & Oncology

## 2016-12-03 VITALS — BP 150/80 | HR 76 | Temp 98.3°F | Resp 17 | Wt 195.0 lb

## 2016-12-03 DIAGNOSIS — C787 Secondary malignant neoplasm of liver and intrahepatic bile duct: Secondary | ICD-10-CM

## 2016-12-03 DIAGNOSIS — C189 Malignant neoplasm of colon, unspecified: Secondary | ICD-10-CM

## 2016-12-03 DIAGNOSIS — Z5111 Encounter for antineoplastic chemotherapy: Secondary | ICD-10-CM | POA: Diagnosis not present

## 2016-12-03 DIAGNOSIS — Z5112 Encounter for antineoplastic immunotherapy: Secondary | ICD-10-CM | POA: Diagnosis present

## 2016-12-03 LAB — CBC WITH DIFFERENTIAL (CANCER CENTER ONLY)
BASO#: 0.1 10*3/uL (ref 0.0–0.2)
BASO%: 1.3 % (ref 0.0–2.0)
EOS ABS: 0.1 10*3/uL (ref 0.0–0.5)
EOS%: 2.3 % (ref 0.0–7.0)
HCT: 45.4 % (ref 38.7–49.9)
HEMOGLOBIN: 16 g/dL (ref 13.0–17.1)
LYMPH#: 1.1 10*3/uL (ref 0.9–3.3)
LYMPH%: 23.5 % (ref 14.0–48.0)
MCH: 31.6 pg (ref 28.0–33.4)
MCHC: 35.2 g/dL (ref 32.0–35.9)
MCV: 90 fL (ref 82–98)
MONO#: 0.6 10*3/uL (ref 0.1–0.9)
MONO%: 12.8 % (ref 0.0–13.0)
NEUT%: 60.1 % (ref 40.0–80.0)
NEUTROS ABS: 2.9 10*3/uL (ref 1.5–6.5)
Platelets: 156 10*3/uL (ref 145–400)
RBC: 5.06 10*6/uL (ref 4.20–5.70)
RDW: 12.7 % (ref 11.1–15.7)
WBC: 4.8 10*3/uL (ref 4.0–10.0)

## 2016-12-03 LAB — CMP (CANCER CENTER ONLY)
ALBUMIN: 3.4 g/dL (ref 3.3–5.5)
ALT(SGPT): 29 U/L (ref 10–47)
AST: 37 U/L (ref 11–38)
Alkaline Phosphatase: 176 U/L — ABNORMAL HIGH (ref 26–84)
BUN, Bld: 11 mg/dL (ref 7–22)
CO2: 28 meq/L (ref 18–33)
Calcium: 9.4 mg/dL (ref 8.0–10.3)
Chloride: 104 mEq/L (ref 98–108)
Creat: 0.8 mg/dl (ref 0.6–1.2)
Glucose, Bld: 118 mg/dL (ref 73–118)
POTASSIUM: 3.8 meq/L (ref 3.3–4.7)
SODIUM: 139 meq/L (ref 128–145)
Total Bilirubin: 0.8 mg/dl (ref 0.20–1.60)
Total Protein: 7.2 g/dL (ref 6.4–8.1)

## 2016-12-03 LAB — LACTATE DEHYDROGENASE: LDH: 200 U/L (ref 125–245)

## 2016-12-03 LAB — CEA (IN HOUSE-CHCC): CEA (CHCC-IN HOUSE): 434.42 ng/mL — AB (ref 0.00–5.00)

## 2016-12-03 MED ORDER — HEPARIN SOD (PORK) LOCK FLUSH 100 UNIT/ML IV SOLN
500.0000 [IU] | Freq: Once | INTRAVENOUS | Status: DC | PRN
  Filled 2016-12-03: qty 5

## 2016-12-03 MED ORDER — OXALIPLATIN CHEMO INJECTION 100 MG/20ML
100.0000 mg/m2 | Freq: Once | INTRAVENOUS | Status: AC
  Administered 2016-12-03: 200 mg via INTRAVENOUS
  Filled 2016-12-03: qty 40

## 2016-12-03 MED ORDER — SODIUM CHLORIDE 0.9% FLUSH
10.0000 mL | INTRAVENOUS | Status: DC | PRN
  Filled 2016-12-03: qty 10

## 2016-12-03 MED ORDER — SODIUM CHLORIDE 0.9 % IV SOLN
14.6000 mg/kg | Freq: Once | INTRAVENOUS | Status: AC
  Administered 2016-12-03: 1300 mg via INTRAVENOUS
  Filled 2016-12-03: qty 48

## 2016-12-03 MED ORDER — DEXAMETHASONE SODIUM PHOSPHATE 10 MG/ML IJ SOLN
10.0000 mg | Freq: Once | INTRAMUSCULAR | Status: AC
  Administered 2016-12-03: 10 mg via INTRAVENOUS

## 2016-12-03 MED ORDER — PALONOSETRON HCL INJECTION 0.25 MG/5ML
0.2500 mg | Freq: Once | INTRAVENOUS | Status: AC
  Administered 2016-12-03: 0.25 mg via INTRAVENOUS

## 2016-12-03 MED ORDER — PALONOSETRON HCL INJECTION 0.25 MG/5ML
INTRAVENOUS | Status: AC
  Filled 2016-12-03: qty 5

## 2016-12-03 MED ORDER — SODIUM CHLORIDE 0.9 % IV SOLN
Freq: Once | INTRAVENOUS | Status: AC
  Administered 2016-12-03: 10:00:00 via INTRAVENOUS

## 2016-12-03 MED ORDER — DEXTROSE 5 % IV SOLN
Freq: Once | INTRAVENOUS | Status: AC
  Administered 2016-12-03: 11:00:00 via INTRAVENOUS

## 2016-12-03 MED ORDER — DEXAMETHASONE SODIUM PHOSPHATE 10 MG/ML IJ SOLN
INTRAMUSCULAR | Status: AC
  Filled 2016-12-03: qty 1

## 2016-12-03 NOTE — Patient Instructions (Signed)
Bevacizumab injection What is this medicine? BEVACIZUMAB (be va SIZ yoo mab) is a monoclonal antibody. It is used to treat many types of cancer. This medicine may be used for other purposes; ask your health care provider or pharmacist if you have questions. COMMON BRAND NAME(S): Avastin What should I tell my health care provider before I take this medicine? They need to know if you have any of these conditions: -diabetes -heart disease -high blood pressure -history of coughing up blood -prior anthracycline chemotherapy (e.g., doxorubicin, daunorubicin, epirubicin) -recent or ongoing radiation therapy -recent or planning to have surgery -stroke -an unusual or allergic reaction to bevacizumab, hamster proteins, mouse proteins, other medicines, foods, dyes, or preservatives -pregnant or trying to get pregnant -breast-feeding How should I use this medicine? This medicine is for infusion into a vein. It is given by a health care professional in a hospital or clinic setting. Talk to your pediatrician regarding the use of this medicine in children. Special care may be needed. Overdosage: If you think you have taken too much of this medicine contact a poison control center or emergency room at once. NOTE: This medicine is only for you. Do not share this medicine with others. What if I miss a dose? It is important not to miss your dose. Call your doctor or health care professional if you are unable to keep an appointment. What may interact with this medicine? Interactions are not expected. This list may not describe all possible interactions. Give your health care provider a list of all the medicines, herbs, non-prescription drugs, or dietary supplements you use. Also tell them if you smoke, drink alcohol, or use illegal drugs. Some items may interact with your medicine. What should I watch for while using this medicine? Your condition will be monitored carefully while you are receiving this  medicine. You will need important blood work and urine testing done while you are taking this medicine. This medicine may increase your risk to bruise or bleed. Call your doctor or health care professional if you notice any unusual bleeding. This medicine should be started at least 28 days following major surgery and the site of the surgery should be totally healed. Check with your doctor before scheduling dental work or surgery while you are receiving this treatment. Talk to your doctor if you have recently had surgery or if you have a wound that has not healed. Do not become pregnant while taking this medicine or for 6 months after stopping it. Women should inform their doctor if they wish to become pregnant or think they might be pregnant. There is a potential for serious side effects to an unborn child. Talk to your health care professional or pharmacist for more information. Do not breast-feed an infant while taking this medicine and for 6 months after the last dose. This medicine has caused ovarian failure in some women. This medicine may interfere with the ability to have a child. You should talk to your doctor or health care professional if you are concerned about your fertility. What side effects may I notice from receiving this medicine? Side effects that you should report to your doctor or health care professional as soon as possible: -allergic reactions like skin rash, itching or hives, swelling of the face, lips, or tongue -chest pain or chest tightness -chills -coughing up blood -high fever -seizures -severe constipation -signs and symptoms of bleeding such as bloody or black, tarry stools; red or dark-brown urine; spitting up blood or brown material that looks   like coffee grounds; red spots on the skin; unusual bruising or bleeding from the eye, gums, or nose -signs and symptoms of a blood clot such as breathing problems; chest pain; severe, sudden headache; pain, swelling, warmth in  the leg -signs and symptoms of a stroke like changes in vision; confusion; trouble speaking or understanding; severe headaches; sudden numbness or weakness of the face, arm or leg; trouble walking; dizziness; loss of balance or coordination -stomach pain -sweating -swelling of legs or ankles -vomiting -weight gain Side effects that usually do not require medical attention (report to your doctor or health care professional if they continue or are bothersome): -back pain -changes in taste -decreased appetite -dry skin -nausea -tiredness This list may not describe all possible side effects. Call your doctor for medical advice about side effects. You may report side effects to FDA at 1-800-FDA-1088. Where should I keep my medicine? This drug is given in a hospital or clinic and will not be stored at home. NOTE: This sheet is a summary. It may not cover all possible information. If you have questions about this medicine, talk to your doctor, pharmacist, or health care provider.  2018 Elsevier/Gold Standard (2016-05-17 14:33:29) Oxaliplatin Injection What is this medicine? OXALIPLATIN (ox AL i PLA tin) is a chemotherapy drug. It targets fast dividing cells, like cancer cells, and causes these cells to die. This medicine is used to treat cancers of the colon and rectum, and many other cancers. This medicine may be used for other purposes; ask your health care provider or pharmacist if you have questions. COMMON BRAND NAME(S): Eloxatin What should I tell my health care provider before I take this medicine? They need to know if you have any of these conditions: -kidney disease -an unusual or allergic reaction to oxaliplatin, other chemotherapy, other medicines, foods, dyes, or preservatives -pregnant or trying to get pregnant -breast-feeding How should I use this medicine? This drug is given as an infusion into a vein. It is administered in a hospital or clinic by a specially trained health  care professional. Talk to your pediatrician regarding the use of this medicine in children. Special care may be needed. Overdosage: If you think you have taken too much of this medicine contact a poison control center or emergency room at once. NOTE: This medicine is only for you. Do not share this medicine with others. What if I miss a dose? It is important not to miss a dose. Call your doctor or health care professional if you are unable to keep an appointment. What may interact with this medicine? -medicines to increase blood counts like filgrastim, pegfilgrastim, sargramostim -probenecid -some antibiotics like amikacin, gentamicin, neomycin, polymyxin B, streptomycin, tobramycin -zalcitabine Talk to your doctor or health care professional before taking any of these medicines: -acetaminophen -aspirin -ibuprofen -ketoprofen -naproxen This list may not describe all possible interactions. Give your health care provider a list of all the medicines, herbs, non-prescription drugs, or dietary supplements you use. Also tell them if you smoke, drink alcohol, or use illegal drugs. Some items may interact with your medicine. What should I watch for while using this medicine? Your condition will be monitored carefully while you are receiving this medicine. You will need important blood work done while you are taking this medicine. This medicine can make you more sensitive to cold. Do not drink cold drinks or use ice. Cover exposed skin before coming in contact with cold temperatures or cold objects. When out in cold weather wear warm clothing   use. Also tell them if you smoke, drink alcohol, or use illegal drugs. Some items may interact with your medicine.  What should I watch for while using this medicine?  Your condition will be monitored carefully while you are receiving this medicine. You will need important blood work done while you are taking this medicine.  This medicine can make you more sensitive to cold. Do not drink cold drinks or use ice. Cover exposed skin before coming in contact with cold temperatures or cold objects. When out in cold weather wear warm clothing and cover your mouth and nose to warm the air that goes into your lungs. Tell your doctor if you get sensitive to the cold.  This drug may make you feel generally unwell. This is not uncommon, as  chemotherapy can affect healthy cells as well as cancer cells. Report any side effects. Continue your course of treatment even though you feel ill unless your doctor tells you to stop.  In some cases, you may be given additional medicines to help with side effects. Follow all directions for their use.  Call your doctor or health care professional for advice if you get a fever, chills or sore throat, or other symptoms of a cold or flu. Do not treat yourself. This drug decreases your body's ability to fight infections. Try to avoid being around people who are sick.  This medicine may increase your risk to bruise or bleed. Call your doctor or health care professional if you notice any unusual bleeding.  Be careful brushing and flossing your teeth or using a toothpick because you may get an infection or bleed more easily. If you have any dental work done, tell your dentist you are receiving this medicine.  Avoid taking products that contain aspirin, acetaminophen, ibuprofen, naproxen, or ketoprofen unless instructed by your doctor. These medicines may hide a fever.  Do not become pregnant while taking this medicine. Women should inform their doctor if they wish to become pregnant or think they might be pregnant. There is a potential for serious side effects to an unborn child. Talk to your health care professional or pharmacist for more information. Do not breast-feed an infant while taking this medicine.  Call your doctor or health care professional if you get diarrhea. Do not treat yourself.  What side effects may I notice from receiving this medicine?  Side effects that you should report to your doctor or health care professional as soon as possible:  -allergic reactions like skin rash, itching or hives, swelling of the face, lips, or tongue  -low blood counts - This drug may decrease the number of white blood cells, red blood cells and platelets. You may be at increased risk for infections and bleeding.   -signs of infection - fever or chills, cough, sore throat, pain or difficulty passing urine  -signs of decreased platelets or bleeding - bruising, pinpoint red spots on the skin, black, tarry stools, nosebleeds  -signs of decreased red blood cells - unusually weak or tired, fainting spells, lightheadedness  -breathing problems  -chest pain, pressure  -cough  -diarrhea  -jaw tightness  -mouth sores  -nausea and vomiting  -pain, swelling, redness or irritation at the injection site  -pain, tingling, numbness in the hands or feet  -problems with balance, talking, walking  -redness, blistering, peeling or loosening of the skin, including inside the mouth  -trouble passing urine or change in the amount of urine  Side effects that usually do not require medical attention (

## 2016-12-03 NOTE — Progress Notes (Signed)
Hematology and Oncology Follow Up Visit  Yehudah Standing [id number] 1950/08/29 66 y.o. 12/03/2016   Principle Diagnosis:  Metastatic colon cancer - K-RAS wild-type - progressive  Current Therapy:   XELOX/Avastin s/p cycle 5 - stopped on 06/30/2015 XELIRI/Erbitux - s/p c#12  (28 day cycles) - omit the Irinotecan;  Status post RFA of liver metastases Clinical Trial at [facility] - [id number] - Perjeta/Herceptin/Exp chemo XELOX/Avastin - s/p cycle #1    Interim History:  Mr. Kakar is here today for follow-up. He did have a little hard time with chemotherapy. He had a lot of diarrhea. He only took the Xeloda for 5 or 6 days. He said that Imodium did help.  He says the diarrhea happens again, he may stop treatment.  I told him that we can try him on every other day Xeloda area and he was still go 14 days. We will see how this does for him.  He does not want to have his upcoming vacation interfered with by side effects of treatment. This is been very very important for him.  His initial CEA was 711. Hopefully, this will be better.  As always, he comes in with his wife. She has done a great job with him.   His appetite is doing okay. It was down a little bit when he had the diarrhea.  He's had no cough or shortness of breath. He did have some effects from the oxaliplatin with some jaw pain.  Overall, his performance status is ECOG 1 . Medications: ( Allergies as of 12/03/2016      Reactions   Bactrim [sulfamethoxazole-trimethoprim] Diarrhea   Has taken 2 different times and both times frequent diarrhea   Lyrica [pregabalin]    "thought I was going to die"   Zofran [ondansetron Hcl] Other (See Comments)   Hiccups      Medication List       Accurate as of 12/03/16  8:37 AM. Always use your most recent med list.          amLODipine-benazepril 5-20 MG capsule Commonly known as:  LOTREL Take 1 capsule by mouth daily.   capecitabine 500 MG tablet Commonly known as:  XELODA TAKE 3  TABLETS (1500 MG) BY MOUTH TWICE A DAY AFTER A MEAL FOR 14 DAYS ON, THEN 7 DAYS OFF.   Coenzyme Q10 10 MG capsule Take 10 mg by mouth daily.   doxycycline 100 MG tablet Commonly known as:  VIBRA-TABS Take 1 tablet (100 mg total) by mouth 2 (two) times daily.   ERBITUX IV Inject into the vein every 30 (thirty) days. Pt takes every 4 weeks; last dose 05/21/2016   gabapentin 100 MG capsule Commonly known as:  NEURONTIN Take 2 capsules (200 mg total) by mouth 3 (three) times daily. Pt okay to take only once a day   ibuprofen 200 MG tablet Commonly known as:  ADVIL,MOTRIN Take 200-400 mg by mouth every 6 (six) hours as needed.   lidocaine-prilocaine cream Commonly known as:  EMLA Apply 1 application topically as needed. Apply quarter sized amount to portacath site 1-2 hours prior to chemotherapy appt.  Cover with saran wrap.   LORazepam 1 MG tablet Commonly known as:  ATIVAN Take 1 tablet (1 mg total) by mouth every 6 (six) hours as needed (NAUSEA).   oxyCODONE-acetaminophen 5-325 MG tablet Commonly known as:  ROXICET Take 1-2 tablets by mouth every 8 (eight) hours as needed for severe pain.   oxymetazoline 0.05 % nasal spray Commonly known as:  AFRIN Place 1 spray into both nostrils 2 (two) times daily as needed for congestion.   traZODone 50 MG tablet Commonly known as:  DESYREL Take 1 tablet (50 mg total) by mouth at bedtime as needed for sleep.       Allergies:  Allergies  Allergen Reactions  . Bactrim [Sulfamethoxazole-Trimethoprim] Diarrhea    Has taken 2 different times and both times frequent diarrhea  . Lyrica [Pregabalin]     "thought I was going to die"  . Zofran [Ondansetron Hcl] Other (See Comments)    Hiccups    Past Medical History, Surgical history, Social history, and Family History were reviewed and updated.  Review of Systems: All other 10 point review of systems is negative.   Physical Exam:  weight is 195 lb (88.5 kg). His oral temperature  is 98.3 F (36.8 C). His blood pressure is 150/80 (abnormal) and his pulse is 76. His respiration is 17 and oxygen saturation is 98%.   Wt Readings from Last 3 Encounters:  12/03/16 195 lb (88.5 kg)  11/07/16 196 lb (88.9 kg)  08/13/16 203 lb 6.4 oz (92.3 kg)    Well-developed and well-nourished white male in no obvious distress. Head and neck exam shows no ocular or oral lesions. There are no palpable cervical or supraclavicular lymph nodes. Lungs are clear. Cardiac exam regular rate and rhythm with no murmurs, rubs or bruits. Abdomen is soft. There may be some slight distention. Bowel sounds are present. He has no guarding or rebound tenderness. No obvious abdominal masses noted. He has no hepatomegaly. Back exam shows no tenderness over the spine, ribs or hips. Externally shows no clubbing, cyanosis or edema. There may be some slight swelling in the left knee. He has good range of motion of the knee. Skin exam shows no rashes, ecchymosis or petechia. Neurological exam is nonfocal.   Lab Results  Component Value Date   WBC 4.8 12/03/2016   HGB 16.0 12/03/2016   HCT 45.4 12/03/2016   MCV 90 12/03/2016   PLT 156 12/03/2016   No results found for: FERRITIN, IRON, TIBC, UIBC, IRONPCTSAT Lab Results  Component Value Date   RBC 5.06 12/03/2016   No results found for: KPAFRELGTCHN, LAMBDASER, KAPLAMBRATIO No results found for: Kandis Cocking, IGMSERUM No results found for: Odetta Pink, SPEI   Chemistry      Component Value Date/Time   NA 135 11/07/2016 1012   NA 140 12/26/2015 1013   K 3.9 11/07/2016 1012   K 4.1 12/26/2015 1013   CL 107 11/07/2016 1012   CO2 24 11/07/2016 1012   CO2 24 12/26/2015 1013   BUN 13 11/07/2016 1012   BUN 16.8 12/26/2015 1013   CREATININE 0.8 11/07/2016 1012   CREATININE 0.8 12/26/2015 1013      Component Value Date/Time   CALCIUM 9.4 11/07/2016 1012   CALCIUM 9.3 12/26/2015 1013   ALKPHOS 187  (H) 11/07/2016 1012   ALKPHOS 109 12/26/2015 1013   AST 37 11/07/2016 1012   AST 28 12/26/2015 1013   ALT 36 11/07/2016 1012   ALT 30 12/26/2015 1013   BILITOT 0.70 11/07/2016 1012   BILITOT 0.77 12/26/2015 1013     Impression and Plan: Mr. Can is 66 year old gentleman with metastatic colon cancer.   He has always wanted quality of life as the primary goal. I totally agree with this. He wants to be as functional as possible. He wants to be able to go on vacation.  We will see how the every other day Xeloda we'll do for him. Maybe, he did not have the diarrhea from Xeloda. I think if his bowels work normally, with the next cycle we may go 2 days in a row with Xeloda and then often day.  His CEA will always tell us what is going on and hopefully, this will be improved.   We will see him back in 3 weeks. Again, he is going on vacation to the beach with his wife. I told them to make sure that they wear sunscreen.    Volanda Napoleon, MD 7/3/20188:37 AM

## 2016-12-06 MED FILL — CAPECITABINE 500 MG TABLET: 500 | 21 days supply | Qty: 84 | Fill #5 | Status: TO

## 2016-12-24 ENCOUNTER — Ambulatory Visit (HOSPITAL_BASED_OUTPATIENT_CLINIC_OR_DEPARTMENT_OTHER): Payer: Medicare Other

## 2016-12-24 ENCOUNTER — Other Ambulatory Visit (HOSPITAL_BASED_OUTPATIENT_CLINIC_OR_DEPARTMENT_OTHER): Payer: Medicare Other

## 2016-12-24 ENCOUNTER — Ambulatory Visit (HOSPITAL_BASED_OUTPATIENT_CLINIC_OR_DEPARTMENT_OTHER): Payer: Medicare Other | Admitting: Hematology & Oncology

## 2016-12-24 ENCOUNTER — Other Ambulatory Visit: Payer: Self-pay | Admitting: *Deleted

## 2016-12-24 VITALS — BP 165/83 | HR 73 | Temp 98.2°F | Resp 16 | Wt 198.0 lb

## 2016-12-24 VITALS — BP 168/88

## 2016-12-24 DIAGNOSIS — C189 Malignant neoplasm of colon, unspecified: Secondary | ICD-10-CM | POA: Diagnosis not present

## 2016-12-24 DIAGNOSIS — C787 Secondary malignant neoplasm of liver and intrahepatic bile duct: Secondary | ICD-10-CM | POA: Diagnosis not present

## 2016-12-24 DIAGNOSIS — Z79899 Other long term (current) drug therapy: Secondary | ICD-10-CM | POA: Diagnosis present

## 2016-12-24 DIAGNOSIS — Z5111 Encounter for antineoplastic chemotherapy: Secondary | ICD-10-CM

## 2016-12-24 DIAGNOSIS — Z5112 Encounter for antineoplastic immunotherapy: Secondary | ICD-10-CM

## 2016-12-24 LAB — CBC WITH DIFFERENTIAL (CANCER CENTER ONLY)
BASO#: 0.1 10*3/uL (ref 0.0–0.2)
BASO%: 1.8 % (ref 0.0–2.0)
EOS%: 2.4 % (ref 0.0–7.0)
Eosinophils Absolute: 0.1 10*3/uL (ref 0.0–0.5)
HEMATOCRIT: 44.2 % (ref 38.7–49.9)
HEMOGLOBIN: 15.7 g/dL (ref 13.0–17.1)
LYMPH#: 1.2 10*3/uL (ref 0.9–3.3)
LYMPH%: 23.9 % (ref 14.0–48.0)
MCH: 31.4 pg (ref 28.0–33.4)
MCHC: 35.5 g/dL (ref 32.0–35.9)
MCV: 88 fL (ref 82–98)
MONO#: 0.6 10*3/uL (ref 0.1–0.9)
MONO%: 12.6 % (ref 0.0–13.0)
NEUT%: 59.3 % (ref 40.0–80.0)
NEUTROS ABS: 2.9 10*3/uL (ref 1.5–6.5)
Platelets: 172 10*3/uL (ref 145–400)
RBC: 5 10*6/uL (ref 4.20–5.70)
RDW: 13.3 % (ref 11.1–15.7)
WBC: 4.9 10*3/uL (ref 4.0–10.0)

## 2016-12-24 LAB — LACTATE DEHYDROGENASE: LDH: 231 U/L (ref 125–245)

## 2016-12-24 LAB — CMP (CANCER CENTER ONLY)
ALBUMIN: 3.4 g/dL (ref 3.3–5.5)
ALK PHOS: 141 U/L — AB (ref 26–84)
ALT: 32 U/L (ref 10–47)
AST: 33 U/L (ref 11–38)
BILIRUBIN TOTAL: 0.7 mg/dL (ref 0.20–1.60)
BUN, Bld: 9 mg/dL (ref 7–22)
CALCIUM: 9.5 mg/dL (ref 8.0–10.3)
CO2: 25 meq/L (ref 18–33)
Chloride: 105 mEq/L (ref 98–108)
Creat: 1.1 mg/dl (ref 0.6–1.2)
GLUCOSE: 94 mg/dL (ref 73–118)
Potassium: 4 mEq/L (ref 3.3–4.7)
SODIUM: 138 meq/L (ref 128–145)
Total Protein: 7.1 g/dL (ref 6.4–8.1)

## 2016-12-24 LAB — CEA (IN HOUSE-CHCC): CEA (CHCC-IN HOUSE): 281.46 ng/mL — AB (ref 0.00–5.00)

## 2016-12-24 LAB — UA PROTEIN, DIPSTICK - CHCC SATELLITE: Protein, Urine: NEGATIVE mg/dL

## 2016-12-24 MED ORDER — PALONOSETRON HCL INJECTION 0.25 MG/5ML
INTRAVENOUS | Status: AC
  Filled 2016-12-24: qty 5

## 2016-12-24 MED ORDER — OXALIPLATIN CHEMO INJECTION 100 MG/20ML
100.0000 mg/m2 | Freq: Once | INTRAVENOUS | Status: AC
  Administered 2016-12-24: 200 mg via INTRAVENOUS
  Filled 2016-12-24: qty 40

## 2016-12-24 MED ORDER — SODIUM CHLORIDE 0.9% FLUSH
10.0000 mL | INTRAVENOUS | Status: DC | PRN
  Administered 2016-12-24: 10 mL
  Filled 2016-12-24: qty 10

## 2016-12-24 MED ORDER — HEPARIN SOD (PORK) LOCK FLUSH 100 UNIT/ML IV SOLN
500.0000 [IU] | Freq: Once | INTRAVENOUS | Status: AC | PRN
  Administered 2016-12-24: 500 [IU]
  Filled 2016-12-24: qty 5

## 2016-12-24 MED ORDER — BEVACIZUMAB CHEMO INJECTION 400 MG/16ML
14.6000 mg/kg | Freq: Once | INTRAVENOUS | Status: AC
  Administered 2016-12-24: 1300 mg via INTRAVENOUS
  Filled 2016-12-24: qty 4

## 2016-12-24 MED ORDER — DEXAMETHASONE SODIUM PHOSPHATE 10 MG/ML IJ SOLN
INTRAMUSCULAR | Status: AC
  Filled 2016-12-24: qty 1

## 2016-12-24 MED ORDER — SODIUM CHLORIDE 0.9 % IV SOLN
Freq: Once | INTRAVENOUS | Status: AC
  Administered 2016-12-24: 10:00:00 via INTRAVENOUS

## 2016-12-24 MED ORDER — DEXTROSE 5 % IV SOLN
Freq: Once | INTRAVENOUS | Status: AC
  Administered 2016-12-24: 13:00:00 via INTRAVENOUS

## 2016-12-24 MED ORDER — PALONOSETRON HCL INJECTION 0.25 MG/5ML
0.2500 mg | Freq: Once | INTRAVENOUS | Status: AC
  Administered 2016-12-24: 0.25 mg via INTRAVENOUS

## 2016-12-24 MED ORDER — DEXAMETHASONE SODIUM PHOSPHATE 10 MG/ML IJ SOLN
10.0000 mg | Freq: Once | INTRAMUSCULAR | Status: AC
Start: 2016-12-24 — End: 2016-12-24
  Administered 2016-12-24: 10 mg via INTRAVENOUS

## 2016-12-24 NOTE — Progress Notes (Signed)
Dr Marin Olp is okay with Avastin dose with BP of 166/88.

## 2016-12-24 NOTE — Progress Notes (Signed)
Hematology and Oncology Follow Up Visit  Ryan Maynard [id number] [birth year] 66 y.o. 12/24/2016   Principle Diagnosis:  Metastatic colon cancer - K-RAS wild-type - progressive  Current Therapy:   XELOX/Avastin s/p cycle 5 - stopped on 06/30/2015 XELIRI/Erbitux - s/p c#12  (28 day cycles) - omit the Irinotecan;  Status post RFA of liver metastases Clinical Trial at [facility] - [id number] - Perjeta/Herceptin/Exp chemo XELOX/Avastin - s/p cycle #1    Interim History:  Ryan Maynard is here today for follow-up. He and his wife to have a very nice vacation. They were at the beach. He really enjoyed himself.  He did have some diarrhea. He only take Xeloda for a day. He was determined that he was not going to have an issue at the beach. As such, he really has not taken his Xeloda since we last saw him.  To no surprise, his last CEA was down to 420. This is typical for Ryan Maynard. Once we get him back on treatment, he typically responds.  He's had no fever. He's had no cough. He's had no nausea or vomiting. He's had no leg swelling.  Overall, his performance status is ECOG 1 . Medications: ( Allergies as of 12/24/2016      Reactions   Bactrim [sulfamethoxazole-trimethoprim] Diarrhea   Has taken 2 different times and both times frequent diarrhea   Lyrica [pregabalin]    "thought I was going to die"   Zofran [ondansetron Hcl] Other (See Comments)   Hiccups      Medication List       Accurate as of 12/24/16  9:53 AM. Always use your most recent med list.          amLODipine-benazepril 5-20 MG capsule Commonly known as:  LOTREL Take 1 capsule by mouth daily.   capecitabine 500 MG tablet Commonly known as:  XELODA TAKE 3 TABLETS (1500 MG) BY MOUTH TWICE A DAY AFTER A MEAL FOR 14 DAYS ON, THEN 7 DAYS OFF.   Coenzyme Q10 10 MG capsule Take 10 mg by mouth daily.   doxycycline 100 MG tablet Commonly known as:  VIBRA-TABS Take 1 tablet (100 mg total) by mouth 2 (two) times daily.     ERBITUX IV Inject into the vein every 30 (thirty) days. Pt takes every 4 weeks; last dose 05/21/2016   gabapentin 100 MG capsule Commonly known as:  NEURONTIN Take 2 capsules (200 mg total) by mouth 3 (three) times daily. Pt okay to take only once a day   ibuprofen 200 MG tablet Commonly known as:  ADVIL,MOTRIN Take 200-400 mg by mouth every 6 (six) hours as needed.   lidocaine-prilocaine cream Commonly known as:  EMLA Apply 1 application topically as needed. Apply quarter sized amount to portacath site 1-2 hours prior to chemotherapy appt.  Cover with saran wrap.   LORazepam 1 MG tablet Commonly known as:  ATIVAN Take 1 tablet (1 mg total) by mouth every 6 (six) hours as needed (NAUSEA).   oxyCODONE-acetaminophen 5-325 MG tablet Commonly known as:  ROXICET Take 1-2 tablets by mouth every 8 (eight) hours as needed for severe pain.   oxymetazoline 0.05 % nasal spray Commonly known as:  AFRIN Place 1 spray into both nostrils 2 (two) times daily as needed for congestion.   traZODone 50 MG tablet Commonly known as:  DESYREL Take 1 tablet (50 mg total) by mouth at bedtime as needed for sleep.       Allergies:  Allergies  Allergen Reactions  . Bactrim [  Sulfamethoxazole-Trimethoprim] Diarrhea    Has taken 2 different times and both times frequent diarrhea  . Lyrica [Pregabalin]     "thought I was going to die"  . Zofran [Ondansetron Hcl] Other (See Comments)    Hiccups    Past Medical History, Surgical history, Social history, and Family History were reviewed and updated.  Review of Systems: All other 10 point review of systems is negative.   Physical Exam:  weight is 198 lb (89.8 kg). His oral temperature is 98.2 F (36.8 C). His blood pressure is 165/83 (abnormal) and his pulse is 73. His respiration is 16 and oxygen saturation is 100%.   Wt Readings from Last 3 Encounters:  12/24/16 198 lb (89.8 kg)  12/03/16 195 lb (88.5 kg)  11/07/16 196 lb (88.9 kg)     Well-developed and well-nourished white male in no obvious distress. Head and neck exam shows no ocular or oral lesions. There are no palpable cervical or supraclavicular lymph nodes. Lungs are clear. Cardiac exam regular rate and rhythm with no murmurs, rubs or bruits. Abdomen is soft. There may be some slight distention. Bowel sounds are present. He has no guarding or rebound tenderness. No obvious abdominal masses noted. He has no hepatomegaly. Back exam shows no tenderness over the spine, ribs or hips. Externally shows no clubbing, cyanosis or edema. There may be some slight swelling in the left knee. He has good range of motion of the knee. Skin exam shows no rashes, ecchymosis or petechia. Neurological exam is nonfocal.   Lab Results  Component Value Date   WBC 4.9 12/24/2016   HGB 15.7 12/24/2016   HCT 44.2 12/24/2016   MCV 88 12/24/2016   PLT 172 12/24/2016   No results found for: FERRITIN, IRON, TIBC, UIBC, IRONPCTSAT Lab Results  Component Value Date   RBC 5.00 12/24/2016   No results found for: KPAFRELGTCHN, LAMBDASER, KAPLAMBRATIO No results found for: IGGSERUM, IGA, IGMSERUM No results found for: Odetta Pink, SPEI   Chemistry      Component Value Date/Time   NA 138 12/24/2016 0821   NA 140 12/26/2015 1013   K 4.0 12/24/2016 0821   K 4.1 12/26/2015 1013   CL 105 12/24/2016 0821   CO2 25 12/24/2016 0821   CO2 24 12/26/2015 1013   BUN 9 12/24/2016 0821   BUN 16.8 12/26/2015 1013   CREATININE 1.1 12/24/2016 0821   CREATININE 0.8 12/26/2015 1013      Component Value Date/Time   CALCIUM 9.5 12/24/2016 0821   CALCIUM 9.3 12/26/2015 1013   ALKPHOS 141 (H) 12/24/2016 0821   ALKPHOS 109 12/26/2015 1013   AST 33 12/24/2016 0821   AST 28 12/26/2015 1013   ALT 32 12/24/2016 0821   ALT 30 12/26/2015 1013   BILITOT 0.70 12/24/2016 0821   BILITOT 0.77 12/26/2015 1013     Impression and Plan: Ryan Maynard is  66 year old gentleman with metastatic colon cancer.   Given the fact that his alkaline phosphatase is coming down, I think this is a good indicator that the next CEA level that we drawn him today will be further decreased.   He is going to try the Xeloda again. Hopefully, there will not be an issue with diarrhea.   We will get him back to see [place] in another 3 weeks. We had to make sure that we work around his vacation schedule.    Volanda Napoleon, MD 7/24/20189:53 AM

## 2016-12-31 ENCOUNTER — Ambulatory Visit: Payer: Medicare Other

## 2016-12-31 ENCOUNTER — Other Ambulatory Visit: Payer: Medicare Other

## 2016-12-31 ENCOUNTER — Ambulatory Visit: Payer: Medicare Other | Admitting: Hematology & Oncology

## 2017-01-02 ENCOUNTER — Other Ambulatory Visit: Payer: Self-pay | Admitting: *Deleted

## 2017-01-02 DIAGNOSIS — C787 Secondary malignant neoplasm of liver and intrahepatic bile duct: Principal | ICD-10-CM

## 2017-01-02 DIAGNOSIS — C9110 Chronic lymphocytic leukemia of B-cell type not having achieved remission: Secondary | ICD-10-CM

## 2017-01-02 DIAGNOSIS — I10 Essential (primary) hypertension: Secondary | ICD-10-CM

## 2017-01-02 DIAGNOSIS — G4700 Insomnia, unspecified: Secondary | ICD-10-CM

## 2017-01-02 DIAGNOSIS — C189 Malignant neoplasm of colon, unspecified: Secondary | ICD-10-CM

## 2017-01-02 DIAGNOSIS — R112 Nausea with vomiting, unspecified: Secondary | ICD-10-CM

## 2017-01-02 MED ORDER — AMLODIPINE BESY-BENAZEPRIL HCL 5-20 MG PO CAPS: 1.0000 | ORAL_CAPSULE | Freq: Every day | ORAL | 3 refills | Status: DC

## 2017-01-02 MED ORDER — TRAZODONE HCL 50 MG PO TABS: 50.0000 mg | ORAL_TABLET | Freq: Every evening | ORAL | 3 refills | Status: DC | PRN

## 2017-01-13 ENCOUNTER — Telehealth: Payer: Self-pay | Admitting: Pharmacist

## 2017-01-13 ENCOUNTER — Other Ambulatory Visit: Payer: Self-pay | Admitting: Hematology & Oncology

## 2017-01-13 DIAGNOSIS — C787 Secondary malignant neoplasm of liver and intrahepatic bile duct: Principal | ICD-10-CM

## 2017-01-13 DIAGNOSIS — C189 Malignant neoplasm of colon, unspecified: Secondary | ICD-10-CM

## 2017-01-13 DIAGNOSIS — R112 Nausea with vomiting, unspecified: Secondary | ICD-10-CM

## 2017-01-13 DIAGNOSIS — C9110 Chronic lymphocytic leukemia of B-cell type not having achieved remission: Secondary | ICD-10-CM

## 2017-01-13 DIAGNOSIS — I10 Essential (primary) hypertension: Secondary | ICD-10-CM

## 2017-01-13 NOTE — Telephone Encounter (Signed)
Oral Chemotherapy Pharmacist Encounter  Follow-Up Form  Pt was not home but spoke with his wife Mrs. Pascua today to follow up regarding patient's oral chemotherapy medication: Xeloda  Pt is doing well today. Per wife patient is taking his Xeloda every other day and taking imodium to lessen GI side effects.  Recent labs reviewed: CBC/CMP from 12/24/16  Other Issues: Wife would like to pick up refill at [facility] tomorrow following Mr. Dyane Dustman MD appt. [place] and medication should be couriered to [facility] for patient pick up.   Mrs. Gracy knows to call the office with questions or concerns. Oral Oncology Clinic will continue to follow.   Thank you,  Nuala Alpha, PharmD, BCPS 01/13/2017 2:50 PM Oral Oncology Clinic [phone]

## 2017-01-14 ENCOUNTER — Ambulatory Visit (HOSPITAL_BASED_OUTPATIENT_CLINIC_OR_DEPARTMENT_OTHER): Payer: Medicare Other

## 2017-01-14 ENCOUNTER — Ambulatory Visit (HOSPITAL_BASED_OUTPATIENT_CLINIC_OR_DEPARTMENT_OTHER): Payer: Medicare Other | Admitting: Hematology & Oncology

## 2017-01-14 ENCOUNTER — Other Ambulatory Visit (HOSPITAL_BASED_OUTPATIENT_CLINIC_OR_DEPARTMENT_OTHER): Payer: Medicare Other

## 2017-01-14 DIAGNOSIS — C189 Malignant neoplasm of colon, unspecified: Secondary | ICD-10-CM

## 2017-01-14 DIAGNOSIS — C787 Secondary malignant neoplasm of liver and intrahepatic bile duct: Secondary | ICD-10-CM

## 2017-01-14 DIAGNOSIS — Z5111 Encounter for antineoplastic chemotherapy: Secondary | ICD-10-CM | POA: Diagnosis not present

## 2017-01-14 DIAGNOSIS — I10 Essential (primary) hypertension: Secondary | ICD-10-CM | POA: Diagnosis not present

## 2017-01-14 DIAGNOSIS — G4700 Insomnia, unspecified: Secondary | ICD-10-CM

## 2017-01-14 DIAGNOSIS — Z5112 Encounter for antineoplastic immunotherapy: Secondary | ICD-10-CM | POA: Diagnosis not present

## 2017-01-14 DIAGNOSIS — R112 Nausea with vomiting, unspecified: Secondary | ICD-10-CM

## 2017-01-14 DIAGNOSIS — C9110 Chronic lymphocytic leukemia of B-cell type not having achieved remission: Secondary | ICD-10-CM

## 2017-01-14 LAB — CMP (CANCER CENTER ONLY)
ALK PHOS: 135 U/L — AB (ref 26–84)
ALT: 23 U/L (ref 10–47)
AST: 32 U/L (ref 11–38)
Albumin: 3.5 g/dL (ref 3.3–5.5)
BILIRUBIN TOTAL: 1 mg/dL (ref 0.20–1.60)
BUN: 10 mg/dL (ref 7–22)
CALCIUM: 9.4 mg/dL (ref 8.0–10.3)
CO2: 28 meq/L (ref 18–33)
Chloride: 104 mEq/L (ref 98–108)
Creat: 1 mg/dl (ref 0.6–1.2)
GLUCOSE: 130 mg/dL — AB (ref 73–118)
Potassium: 4 mEq/L (ref 3.3–4.7)
SODIUM: 140 meq/L (ref 128–145)
Total Protein: 7.6 g/dL (ref 6.4–8.1)

## 2017-01-14 LAB — CBC WITH DIFFERENTIAL (CANCER CENTER ONLY)
BASO#: 0.1 10*3/uL (ref 0.0–0.2)
BASO%: 1.4 % (ref 0.0–2.0)
EOS%: 1.8 % (ref 0.0–7.0)
Eosinophils Absolute: 0.1 10*3/uL (ref 0.0–0.5)
HEMATOCRIT: 43.9 % (ref 38.7–49.9)
HGB: 15.7 g/dL (ref 13.0–17.1)
LYMPH#: 1.1 10*3/uL (ref 0.9–3.3)
LYMPH%: 24.4 % (ref 14.0–48.0)
MCH: 31.9 pg (ref 28.0–33.4)
MCHC: 35.8 g/dL (ref 32.0–35.9)
MCV: 89 fL (ref 82–98)
MONO#: 0.6 10*3/uL (ref 0.1–0.9)
MONO%: 12.8 % (ref 0.0–13.0)
NEUT%: 59.6 % (ref 40.0–80.0)
NEUTROS ABS: 2.6 10*3/uL (ref 1.5–6.5)
PLATELETS: 140 10*3/uL — AB (ref 145–400)
RBC: 4.92 10*6/uL (ref 4.20–5.70)
RDW: 14 % (ref 11.1–15.7)
WBC: 4.4 10*3/uL (ref 4.0–10.0)

## 2017-01-14 LAB — CEA (IN HOUSE-CHCC): CEA (CHCC-In House): 292.32 ng/mL — ABNORMAL HIGH (ref 0.00–5.00)

## 2017-01-14 MED ORDER — CLONIDINE HCL 0.1 MG PO TABS: 0.1000 mg | ORAL_TABLET | Freq: Once | ORAL | Status: DC

## 2017-01-14 MED ORDER — DEXTROSE 5 % IV SOLN
Freq: Once | INTRAVENOUS | Status: AC
  Administered 2017-01-14: 10:00:00 via INTRAVENOUS

## 2017-01-14 MED ORDER — HEPARIN SOD (PORK) LOCK FLUSH 100 UNIT/ML IV SOLN
500.0000 [IU] | Freq: Once | INTRAVENOUS | Status: AC | PRN
  Filled 2017-01-14: qty 5

## 2017-01-14 MED ORDER — OXALIPLATIN CHEMO INJECTION 100 MG/20ML
100.0000 mg/m2 | Freq: Once | INTRAVENOUS | Status: AC
  Administered 2017-01-14: 200 mg via INTRAVENOUS
  Filled 2017-01-14: qty 40

## 2017-01-14 MED ORDER — PALONOSETRON HCL INJECTION 0.25 MG/5ML
0.2500 mg | Freq: Once | INTRAVENOUS | Status: AC
  Administered 2017-01-14: 0.25 mg via INTRAVENOUS

## 2017-01-14 MED ORDER — AMLODIPINE BESY-BENAZEPRIL HCL 10-40 MG PO CAPS: 1.0000 | ORAL_CAPSULE | Freq: Every day | ORAL | 3 refills | Status: DC

## 2017-01-14 MED ORDER — SODIUM CHLORIDE 0.9% FLUSH
10.0000 mL | INTRAVENOUS | Status: DC | PRN
  Administered 2017-01-14: 10 mL
  Filled 2017-01-14: qty 10

## 2017-01-14 MED ORDER — PALONOSETRON HCL INJECTION 0.25 MG/5ML
INTRAVENOUS | Status: AC
  Filled 2017-01-14: qty 5

## 2017-01-14 MED ORDER — SODIUM CHLORIDE 0.9% FLUSH
10.0000 mL | INTRAVENOUS | Status: DC | PRN
  Filled 2017-01-14: qty 10

## 2017-01-14 MED ORDER — DEXAMETHASONE SODIUM PHOSPHATE 10 MG/ML IJ SOLN
INTRAMUSCULAR | Status: AC
  Filled 2017-01-14: qty 1

## 2017-01-14 MED ORDER — TRAZODONE HCL 100 MG PO TABS: 100.0000 mg | ORAL_TABLET | Freq: Every evening | ORAL | 4 refills | Status: DC | PRN

## 2017-01-14 MED ORDER — CLONIDINE HCL 0.1 MG PO TABS
0.1000 mg | ORAL_TABLET | Freq: Once | ORAL | Status: AC
  Administered 2017-01-14: 0.1 mg via ORAL

## 2017-01-14 MED ORDER — BEVACIZUMAB CHEMO INJECTION 400 MG/16ML
1300.0000 mg | Freq: Once | INTRAVENOUS | Status: AC
  Administered 2017-01-14: 1300 mg via INTRAVENOUS
  Filled 2017-01-14: qty 48

## 2017-01-14 MED ORDER — DEXAMETHASONE SODIUM PHOSPHATE 10 MG/ML IJ SOLN
10.0000 mg | Freq: Once | INTRAMUSCULAR | Status: AC
  Administered 2017-01-14: 10 mg via INTRAVENOUS

## 2017-01-14 MED ORDER — LOPERAMIDE HCL 2 MG PO CAPS: 2.0000 mg | ORAL_CAPSULE | ORAL | 2 refills | Status: AC | PRN

## 2017-01-14 MED ORDER — HEPARIN SOD (PORK) LOCK FLUSH 100 UNIT/ML IV SOLN
500.0000 [IU] | Freq: Once | INTRAVENOUS | Status: AC | PRN
  Administered 2017-01-14: 500 [IU]
  Filled 2017-01-14: qty 5

## 2017-01-14 MED ORDER — SODIUM CHLORIDE 0.9 % IV SOLN
Freq: Once | INTRAVENOUS | Status: AC
  Administered 2017-01-14: 09:00:00 via INTRAVENOUS

## 2017-01-14 MED FILL — XELODA 500 MG TABLET: 500 | 21 days supply | Qty: 84 | Fill #0

## 2017-01-14 NOTE — Addendum Note (Signed)
Addended by: Burney Gauze R on: 01/14/2017 09:21 AM   Modules accepted: Orders

## 2017-01-14 NOTE — Addendum Note (Signed)
Addended by: Burney Gauze R on: 01/14/2017 09:04 AM   Modules accepted: Orders

## 2017-01-14 NOTE — Progress Notes (Addendum)
Hematology and Oncology Follow Up Visit  Ryan Maynard [id number] May 22, 1951 66 y.o. 01/14/2017   Principle Diagnosis:  Metastatic colon cancer - K-RAS wild-type - progressive  Current Therapy:   XELOX/Avastin s/p cycle 5 - stopped on 06/30/2015 XELIRI/Erbitux - s/p c#12  (28 day cycles) - omit the Irinotecan;  Status post RFA of liver metastases Clinical Trial at [facility] - [id number] - Perjeta/Herceptin/Exp chemo XELOX/Avastin - s/p cycle #3    Interim History:  Ryan Maynard is here today for follow-up. He still is having a little bit of difficulty with the Xeloda. He says that once he starts his Xeloda, the next day he has some diarrhea. He then stops taking the Xeloda. He takes Imodium and this does seem to help.  I told him to try taking Imodium with the Xeloda. He is now afraid that he may have constipation. I told him that if the Xeloda would be continued, then I would not think constipation should be a problem.  His CEA is coming down nicely. His last CEA was 281. Hopefully, if he continues with his Xeloda, the CA will respond even better.  He has had no cough. He has had no mouth sores. He's had no visual issues. There had been no urinary problems. He has had no leg swelling. He's had no rashes.  He had 11 sugars of brain at his house. He really wanted to have some rain. He got the rain that he asked for.  Overall, his performance status is ECOG 1 . Medications: ( Allergies as of 01/14/2017      Reactions   Bactrim [sulfamethoxazole-trimethoprim] Diarrhea   Has taken 2 different times and both times frequent diarrhea   Lyrica [pregabalin]    "thought I was going to die"   Zofran [ondansetron Hcl] Other (See Comments)   Hiccups      Medication List       Accurate as of 01/14/17  8:34 AM. Always use your most recent med list.          amLODipine-benazepril 5-20 MG capsule Commonly known as:  LOTREL Take 1 capsule by mouth daily.   capecitabine 500 MG  tablet Commonly known as:  XELODA TAKE 3 TABLETS BY MOUTH TWICE A DAY AFTER A MEAL FOR 14 DAYS ON, THEN 7 DAYS OFF.   Coenzyme Q10 10 MG capsule Take 10 mg by mouth daily.   doxycycline 100 MG tablet Commonly known as:  VIBRA-TABS Take 1 tablet (100 mg total) by mouth 2 (two) times daily.   ERBITUX IV Inject into the vein every 30 (thirty) days. Pt takes every 4 weeks; last dose 05/21/2016   gabapentin 100 MG capsule Commonly known as:  NEURONTIN Take 2 capsules (200 mg total) by mouth 3 (three) times daily. Pt okay to take only once a day   ibuprofen 200 MG tablet Commonly known as:  ADVIL,MOTRIN Take 200-400 mg by mouth every 6 (six) hours as needed.   lidocaine-prilocaine cream Commonly known as:  EMLA Apply 1 application topically as needed. Apply quarter sized amount to portacath site 1-2 hours prior to chemotherapy appt.  Cover with saran wrap.   LORazepam 1 MG tablet Commonly known as:  ATIVAN Take 1 tablet (1 mg total) by mouth every 6 (six) hours as needed (NAUSEA).   oxyCODONE-acetaminophen 5-325 MG tablet Commonly known as:  ROXICET Take 1-2 tablets by mouth every 8 (eight) hours as needed for severe pain.   oxymetazoline 0.05 % nasal spray Commonly known as:  Melissa Montane  Place 1 spray into both nostrils 2 (two) times daily as needed for congestion.   traZODone 50 MG tablet Commonly known as:  DESYREL Take 1 tablet (50 mg total) by mouth at bedtime as needed for sleep.       Allergies:  Allergies  Allergen Reactions  . Bactrim [Sulfamethoxazole-Trimethoprim] Diarrhea    Has taken 2 different times and both times frequent diarrhea  . Lyrica [Pregabalin]     "thought I was going to die"  . Zofran [Ondansetron Hcl] Other (See Comments)    Hiccups    Past Medical History, Surgical history, Social history, and Family History were reviewed and updated.  Review of Systems: All other 10 point review of systems is negative.   Physical Exam:  weight is 196 lb  (88.9 kg). His oral temperature is 98.4 F (36.9 C). His blood pressure is 172/97 (abnormal) and his pulse is 83. His respiration is 20 and oxygen saturation is 98%.   Wt Readings from Last 3 Encounters:  01/14/17 196 lb (88.9 kg)  12/24/16 198 lb (89.8 kg)  12/03/16 195 lb (88.5 kg)    Well-developed and well-nourished white male in no obvious distress. Head and neck exam shows no ocular or oral lesions. There are no palpable cervical or supraclavicular lymph nodes. Lungs are clear. Cardiac exam regular rate and rhythm with no murmurs, rubs or bruits. Abdomen is soft. There may be some slight distention. Bowel sounds are present. He has no guarding or rebound tenderness. No obvious abdominal masses noted. He has no hepatomegaly. Back exam shows no tenderness over the spine, ribs or hips. Externally shows no clubbing, cyanosis or edema. There may be some slight swelling in the left knee. He has good range of motion of the knee. Skin exam shows no rashes, ecchymosis or petechia. Neurological exam is nonfocal.   Lab Results  Component Value Date   WBC 4.4 01/14/2017   HGB 15.7 01/14/2017   HCT 43.9 01/14/2017   MCV 89 01/14/2017   PLT 140 (L) 01/14/2017   No results found for: FERRITIN, IRON, TIBC, UIBC, IRONPCTSAT Lab Results  Component Value Date   RBC 4.92 01/14/2017   No results found for: KPAFRELGTCHN, LAMBDASER, KAPLAMBRATIO No results found for: IGGSERUM, IGA, IGMSERUM No results found for: Odetta Pink, SPEI   Chemistry      Component Value Date/Time   NA 138 12/24/2016 0821   NA 140 12/26/2015 1013   K 4.0 12/24/2016 0821   K 4.1 12/26/2015 1013   CL 105 12/24/2016 0821   CO2 25 12/24/2016 0821   CO2 24 12/26/2015 1013   BUN 9 12/24/2016 0821   BUN 16.8 12/26/2015 1013   CREATININE 1.1 12/24/2016 0821   CREATININE 0.8 12/26/2015 1013      Component Value Date/Time   CALCIUM 9.5 12/24/2016 0821   CALCIUM 9.3  12/26/2015 1013   ALKPHOS 141 (H) 12/24/2016 0821   ALKPHOS 109 12/26/2015 1013   AST 33 12/24/2016 0821   AST 28 12/26/2015 1013   ALT 32 12/24/2016 0821   ALT 30 12/26/2015 1013   BILITOT 0.70 12/24/2016 0821   BILITOT 0.77 12/26/2015 1013     Impression and Plan: Ryan Maynard is 66 year old gentleman with metastatic colon cancer.   Given the fact that his alkaline phosphatase isStill coming down, I think this is a good indicator that the next CEA level that we drawn him today will be further decreased.   He is  going to try the Xeloda again. Hopefully, there will not be an issue with diarrhea. I try to reassure him that he continues the Imodium, by continuing the Xeloda, he should not get constipated.  This will be cycle #4. As long as his CEA is coming down, I just don't think that a scan will  really add much to our management recommendations.  We will get him back to see [place] in another 3 weeks. We had to make sure that we work around his vacation schedule.    Volanda Napoleon, MD 8/14/20188:34 AM

## 2017-02-04 ENCOUNTER — Other Ambulatory Visit (HOSPITAL_BASED_OUTPATIENT_CLINIC_OR_DEPARTMENT_OTHER): Payer: Medicare Other

## 2017-02-04 ENCOUNTER — Ambulatory Visit (HOSPITAL_BASED_OUTPATIENT_CLINIC_OR_DEPARTMENT_OTHER): Payer: Medicare Other

## 2017-02-04 ENCOUNTER — Ambulatory Visit (HOSPITAL_BASED_OUTPATIENT_CLINIC_OR_DEPARTMENT_OTHER): Payer: Medicare Other | Admitting: Hematology & Oncology

## 2017-02-04 VITALS — BP 154/83 | HR 84 | Temp 98.2°F | Resp 17 | Wt 197.1 lb

## 2017-02-04 VITALS — BP 140/81 | HR 71

## 2017-02-04 DIAGNOSIS — C189 Malignant neoplasm of colon, unspecified: Secondary | ICD-10-CM

## 2017-02-04 DIAGNOSIS — Z5111 Encounter for antineoplastic chemotherapy: Secondary | ICD-10-CM | POA: Diagnosis not present

## 2017-02-04 DIAGNOSIS — Z5112 Encounter for antineoplastic immunotherapy: Secondary | ICD-10-CM | POA: Diagnosis not present

## 2017-02-04 DIAGNOSIS — I10 Essential (primary) hypertension: Secondary | ICD-10-CM

## 2017-02-04 DIAGNOSIS — C787 Secondary malignant neoplasm of liver and intrahepatic bile duct: Principal | ICD-10-CM

## 2017-02-04 DIAGNOSIS — G4700 Insomnia, unspecified: Secondary | ICD-10-CM

## 2017-02-04 DIAGNOSIS — R112 Nausea with vomiting, unspecified: Secondary | ICD-10-CM

## 2017-02-04 DIAGNOSIS — C9110 Chronic lymphocytic leukemia of B-cell type not having achieved remission: Secondary | ICD-10-CM

## 2017-02-04 LAB — CMP (CANCER CENTER ONLY)
ALK PHOS: 119 U/L — AB (ref 26–84)
ALT: 24 U/L (ref 10–47)
AST: 33 U/L (ref 11–38)
Albumin: 3.5 g/dL (ref 3.3–5.5)
BUN, Bld: 16 mg/dL (ref 7–22)
CALCIUM: 9.1 mg/dL (ref 8.0–10.3)
CO2: 30 mEq/L (ref 18–33)
Chloride: 106 mEq/L (ref 98–108)
Creat: 1.1 mg/dl (ref 0.6–1.2)
GLUCOSE: 134 mg/dL — AB (ref 73–118)
POTASSIUM: 3.9 meq/L (ref 3.3–4.7)
Sodium: 140 mEq/L (ref 128–145)
Total Bilirubin: 0.9 mg/dl (ref 0.20–1.60)
Total Protein: 7.3 g/dL (ref 6.4–8.1)

## 2017-02-04 LAB — CBC WITH DIFFERENTIAL (CANCER CENTER ONLY)
BASO#: 0.1 10*3/uL (ref 0.0–0.2)
BASO%: 1.5 % (ref 0.0–2.0)
EOS ABS: 0.1 10*3/uL (ref 0.0–0.5)
EOS%: 1.7 % (ref 0.0–7.0)
HEMATOCRIT: 44 % (ref 38.7–49.9)
HGB: 15.5 g/dL (ref 13.0–17.1)
LYMPH#: 1.3 10*3/uL (ref 0.9–3.3)
LYMPH%: 27.8 % (ref 14.0–48.0)
MCH: 32.6 pg (ref 28.0–33.4)
MCHC: 35.2 g/dL (ref 32.0–35.9)
MCV: 93 fL (ref 82–98)
MONO#: 0.7 10*3/uL (ref 0.1–0.9)
MONO%: 14.4 % — ABNORMAL HIGH (ref 0.0–13.0)
NEUT%: 54.6 % (ref 40.0–80.0)
NEUTROS ABS: 2.6 10*3/uL (ref 1.5–6.5)
Platelets: 146 10*3/uL (ref 145–400)
RBC: 4.75 10*6/uL (ref 4.20–5.70)
RDW: 15.9 % — AB (ref 11.1–15.7)
WBC: 4.7 10*3/uL (ref 4.0–10.0)

## 2017-02-04 LAB — CEA (IN HOUSE-CHCC): CEA (CHCC-IN HOUSE): 338.6 ng/mL — AB (ref 0.00–5.00)

## 2017-02-04 MED ORDER — DEXAMETHASONE SODIUM PHOSPHATE 10 MG/ML IJ SOLN
INTRAMUSCULAR | Status: AC
  Filled 2017-02-04: qty 1

## 2017-02-04 MED ORDER — DEXAMETHASONE SODIUM PHOSPHATE 10 MG/ML IJ SOLN
10.0000 mg | Freq: Once | INTRAMUSCULAR | Status: AC
  Administered 2017-02-04: 10 mg via INTRAVENOUS

## 2017-02-04 MED ORDER — SODIUM CHLORIDE 0.9 % IV SOLN
Freq: Once | INTRAVENOUS | Status: AC
  Administered 2017-02-04: 10:00:00 via INTRAVENOUS

## 2017-02-04 MED ORDER — OXALIPLATIN CHEMO INJECTION 100 MG/20ML
100.0000 mg/m2 | Freq: Once | INTRAVENOUS | Status: AC
  Administered 2017-02-04: 200 mg via INTRAVENOUS
  Filled 2017-02-04: qty 40

## 2017-02-04 MED ORDER — SODIUM CHLORIDE 0.9 % IV SOLN
14.7000 mg/kg | Freq: Once | INTRAVENOUS | Status: AC
  Administered 2017-02-04: 1300 mg via INTRAVENOUS
  Filled 2017-02-04: qty 48

## 2017-02-04 MED ORDER — SODIUM CHLORIDE 0.9% FLUSH
10.0000 mL | INTRAVENOUS | Status: DC | PRN
  Administered 2017-02-04: 10 mL
  Filled 2017-02-04: qty 10

## 2017-02-04 MED ORDER — PALONOSETRON HCL INJECTION 0.25 MG/5ML
INTRAVENOUS | Status: AC
  Filled 2017-02-04: qty 5

## 2017-02-04 MED ORDER — PALONOSETRON HCL INJECTION 0.25 MG/5ML
0.2500 mg | Freq: Once | INTRAVENOUS | Status: AC
  Administered 2017-02-04: 0.25 mg via INTRAVENOUS

## 2017-02-04 MED ORDER — HEPARIN SOD (PORK) LOCK FLUSH 100 UNIT/ML IV SOLN
500.0000 [IU] | Freq: Once | INTRAVENOUS | Status: AC | PRN
  Administered 2017-02-04: 500 [IU]
  Filled 2017-02-04: qty 5

## 2017-02-04 MED ORDER — DEXTROSE 5 % IV SOLN
Freq: Once | INTRAVENOUS | Status: AC
  Administered 2017-02-04: 11:00:00 via INTRAVENOUS

## 2017-02-04 NOTE — Progress Notes (Signed)
Hematology and Oncology Follow Up Visit  Ryan Maynard [id number] 12-25-1950 66 y.o. 02/04/2017   Principle Diagnosis:  Metastatic colon cancer - K-RAS wild-type - progressive  Current Therapy:   XELOX/Avastin s/p cycle 5 - stopped on 06/30/2015 XELIRI/Erbitux - s/p c#12  (28 day cycles) - omit the Irinotecan;  Status post RFA of liver metastases Clinical Trial at [facility] - [id number] - Perjeta/Herceptin/Exp chemo XELOX/Avastin - s/p cycle #4    Interim History:  Ryan Maynard is here today for follow-up. He really had a nice Labor Day weekend. He did labor. He put down side in his yard. I am absolutely impressed by that. His wife also help. His wife was mowing the yard.  His last CEA, was up a little bit. It was 292. However, he now took his Xeloda the whole 14 days. Before, he will take Xeloda for a couple days. I think that now that he took Xeloda for the entire 14 day cycle, I think his CEA will improve.   He had no nausea or vomiting. He had a little bit of diarrhea but this also improved.   He has no cough. His no shortness of breath. There is no headache. He had no mouth sores.   Overall, his performance status is ECOG 0.  . Medications: ( Allergies as of 02/04/2017      Reactions   Bactrim [sulfamethoxazole-trimethoprim] Diarrhea   Has taken 2 different times and both times frequent diarrhea   Lyrica [pregabalin]    "thought I was going to die"   Zofran [ondansetron Hcl] Other (See Comments)   Hiccups      Medication List       Accurate as of 02/04/17  9:23 AM. Always use your most recent med list.          amLODipine-benazepril 10-40 MG capsule Commonly known as:  LOTREL Take 1 capsule by mouth daily.   capecitabine 500 MG tablet Commonly known as:  XELODA TAKE 3 TABLETS BY MOUTH TWICE A DAY AFTER A MEAL FOR 14 DAYS ON, THEN 7 DAYS OFF.   Coenzyme Q10 10 MG capsule Take 10 mg by mouth daily.   doxycycline 100 MG tablet Commonly known as:  VIBRA-TABS Take 1  tablet (100 mg total) by mouth 2 (two) times daily.   gabapentin 100 MG capsule Commonly known as:  NEURONTIN Take 2 capsules (200 mg total) by mouth 3 (three) times daily. Pt okay to take only once a day   ibuprofen 200 MG tablet Commonly known as:  ADVIL,MOTRIN Take 200-400 mg by mouth every 6 (six) hours as needed.   lidocaine-prilocaine cream Commonly known as:  EMLA Apply 1 application topically as needed. Apply quarter sized amount to portacath site 1-2 hours prior to chemotherapy appt.  Cover with saran wrap.   loperamide 2 MG capsule Commonly known as:  IMODIUM Take 1 capsule (2 mg total) by mouth as needed for diarrhea or loose stools.   LORazepam 1 MG tablet Commonly known as:  ATIVAN Take 1 tablet (1 mg total) by mouth every 6 (six) hours as needed (NAUSEA).   oxyCODONE-acetaminophen 5-325 MG tablet Commonly known as:  ROXICET Take 1-2 tablets by mouth every 8 (eight) hours as needed for severe pain.   oxymetazoline 0.05 % nasal spray Commonly known as:  AFRIN Place 1 spray into both nostrils 2 (two) times daily as needed for congestion.   traZODone 100 MG tablet Commonly known as:  DESYREL Take 1 tablet (100 mg total) by mouth  at bedtime as needed for sleep.       Allergies:  Allergies  Allergen Reactions  . Bactrim [Sulfamethoxazole-Trimethoprim] Diarrhea    Has taken 2 different times and both times frequent diarrhea  . Lyrica [Pregabalin]     "thought I was going to die"  . Zofran [Ondansetron Hcl] Other (See Comments)    Hiccups    Past Medical History, Surgical history, Social history, and Family History were reviewed and updated.  Review of Systems: As stated in the interim history  Physical Exam:  weight is 197 lb 1.9 oz (89.4 kg). His oral temperature is 98.2 F (36.8 C). His blood pressure is 154/83 (abnormal) and his pulse is 84. His respiration is 17 and oxygen saturation is 99%.   Wt Readings from Last 3 Encounters:  02/04/17 197 lb  1.9 oz (89.4 kg)  01/14/17 196 lb (88.9 kg)  12/24/16 198 lb (89.8 kg)    Physical Exam  Constitutional: He is oriented to person, place, and time.  HENT:  Head: Normocephalic and atraumatic.  Mouth/Throat: Oropharynx is clear and moist.  Eyes: Pupils are equal, round, and reactive to light. EOM are normal.  Neck: Normal range of motion.  Cardiovascular: Normal rate, regular rhythm and normal heart sounds.   Pulmonary/Chest: Effort normal and breath sounds normal.  Abdominal: Soft. Bowel sounds are normal.  Musculoskeletal: Normal range of motion. He exhibits no edema, tenderness or deformity.  Lymphadenopathy:    He has no cervical adenopathy.  Neurological: He is alert and oriented to person, place, and time.  Skin: Skin is warm and dry. No rash noted. No erythema.  Psychiatric: He has a normal mood and affect. His behavior is normal. Judgment and thought content normal.  Vitals reviewed.    Lab Results  Component Value Date   WBC 4.7 02/04/2017   HGB 15.5 02/04/2017   HCT 44.0 02/04/2017   MCV 93 02/04/2017   PLT 146 02/04/2017   No results found for: FERRITIN, IRON, TIBC, UIBC, IRONPCTSAT Lab Results  Component Value Date   RBC 4.75 02/04/2017   No results found for: KPAFRELGTCHN, LAMBDASER, KAPLAMBRATIO No results found for: Kandis Cocking, IGMSERUM No results found for: Odetta Pink, SPEI   Chemistry      Component Value Date/Time   NA 140 02/04/2017 0821   NA 140 12/26/2015 1013   K 3.9 02/04/2017 0821   K 4.1 12/26/2015 1013   CL 106 02/04/2017 0821   CO2 30 02/04/2017 0821   CO2 24 12/26/2015 1013   BUN 16 02/04/2017 0821   BUN 16.8 12/26/2015 1013   CREATININE 1.1 02/04/2017 0821   CREATININE 0.8 12/26/2015 1013      Component Value Date/Time   CALCIUM 9.1 02/04/2017 0821   CALCIUM 9.3 12/26/2015 1013   ALKPHOS 119 (H) 02/04/2017 0821   ALKPHOS 109 12/26/2015 1013   AST 33 02/04/2017 0821    AST 28 12/26/2015 1013   ALT 24 02/04/2017 0821   ALT 30 12/26/2015 1013   BILITOT 0.90 02/04/2017 0821   BILITOT 0.77 12/26/2015 1013     Impression and Plan: Ryan Maynard is 66 year old gentleman with metastatic colon cancer.   I do believe that his CEA will be better. His alkaline phosphatase has come down.  We will continue to treat him. As far as doing scans, I think that as far as his CEA is coming down, I'm ushers scan really will help [place] out.   We will  plan to get him back in another 3 weeks. I am just happy that his Xeloda did not cause problems as time.     Volanda Napoleon, MD 9/4/20189:23 AM

## 2017-02-04 NOTE — Addendum Note (Signed)
Addended by: Burney Gauze R on: 02/04/2017 04:23 PM   Modules accepted: Orders

## 2017-02-04 NOTE — Patient Instructions (Signed)
[facility] Discharge Instructions for Patients Receiving Chemotherapy  Today you received the following chemotherapy agents Avastin/Oxaliplatin To help prevent nausea and vomiting after your treatment, we encourage you to take your nausea medication as prescribed.  If you develop nausea and vomiting that is not controlled by your nausea medication, call the clinic.   BELOW ARE SYMPTOMS THAT SHOULD BE REPORTED IMMEDIATELY:  *FEVER GREATER THAN 100.5 F  *CHILLS WITH OR WITHOUT FEVER  NAUSEA AND VOMITING THAT IS NOT CONTROLLED WITH YOUR NAUSEA MEDICATION  *UNUSUAL SHORTNESS OF BREATH  *UNUSUAL BRUISING OR BLEEDING  TENDERNESS IN MOUTH AND THROAT WITH OR WITHOUT PRESENCE OF ULCERS  *URINARY PROBLEMS  *BOWEL PROBLEMS  UNUSUAL RASH Items with * indicate a potential emergency and should be followed up as soon as possible.  Feel free to call the clinic you have any questions or concerns. The clinic phone number is [phone].  Please show the [place] at check-in to the Emergency Department and triage nurse.

## 2017-02-11 MED FILL — XELODA 500 MG TABLET: 500 | 21 days supply | Qty: 84 | Fill #1

## 2017-02-20 ENCOUNTER — Encounter (HOSPITAL_COMMUNITY)
Admission: RE | Admit: 2017-02-20 | Discharge: 2017-02-20 | Disposition: A | Discharge status: Home / Self Care | Payer: Medicare Other | Source: Ambulatory Visit | Attending: Hematology & Oncology | Admitting: Hematology & Oncology

## 2017-02-20 DIAGNOSIS — C787 Secondary malignant neoplasm of liver and intrahepatic bile duct: Secondary | ICD-10-CM | POA: Diagnosis not present

## 2017-02-20 DIAGNOSIS — C189 Malignant neoplasm of colon, unspecified: Secondary | ICD-10-CM | POA: Diagnosis not present

## 2017-02-20 LAB — GLUCOSE, CAPILLARY: GLUCOSE-CAPILLARY: 103 mg/dL — AB (ref 65–99)

## 2017-02-20 MED ORDER — FLUDEOXYGLUCOSE F - 18 (FDG) INJECTION
10.1000 | Freq: Once | INTRAVENOUS | Status: AC | PRN
  Administered 2017-02-20: 10.1 via INTRAVENOUS

## 2017-02-25 ENCOUNTER — Ambulatory Visit (HOSPITAL_BASED_OUTPATIENT_CLINIC_OR_DEPARTMENT_OTHER): Payer: Medicare Other | Admitting: Hematology & Oncology

## 2017-02-25 ENCOUNTER — Other Ambulatory Visit (HOSPITAL_BASED_OUTPATIENT_CLINIC_OR_DEPARTMENT_OTHER): Payer: Medicare Other

## 2017-02-25 ENCOUNTER — Ambulatory Visit: Payer: Medicare Other

## 2017-02-25 VITALS — BP 148/76 | HR 76 | Temp 98.3°F | Resp 18 | Wt 201.0 lb

## 2017-02-25 DIAGNOSIS — C189 Malignant neoplasm of colon, unspecified: Secondary | ICD-10-CM | POA: Diagnosis not present

## 2017-02-25 DIAGNOSIS — C787 Secondary malignant neoplasm of liver and intrahepatic bile duct: Secondary | ICD-10-CM

## 2017-02-25 LAB — CBC WITH DIFFERENTIAL (CANCER CENTER ONLY)
BASO#: 0.1 10*3/uL (ref 0.0–0.2)
BASO%: 2 % (ref 0.0–2.0)
EOS%: 2.2 % (ref 0.0–7.0)
Eosinophils Absolute: 0.1 10*3/uL (ref 0.0–0.5)
HCT: 43.4 % (ref 38.7–49.9)
HEMOGLOBIN: 15.4 g/dL (ref 13.0–17.1)
LYMPH#: 1.1 10*3/uL (ref 0.9–3.3)
LYMPH%: 23.7 % (ref 14.0–48.0)
MCH: 33.4 pg (ref 28.0–33.4)
MCHC: 35.5 g/dL (ref 32.0–35.9)
MCV: 94 fL (ref 82–98)
MONO#: 0.7 10*3/uL (ref 0.1–0.9)
MONO%: 16.4 % — ABNORMAL HIGH (ref 0.0–13.0)
NEUT%: 55.7 % (ref 40.0–80.0)
NEUTROS ABS: 2.5 10*3/uL (ref 1.5–6.5)
PLATELETS: 140 10*3/uL — AB (ref 145–400)
RBC: 4.61 10*6/uL (ref 4.20–5.70)
RDW: 16.9 % — ABNORMAL HIGH (ref 11.1–15.7)
WBC: 4.5 10*3/uL (ref 4.0–10.0)

## 2017-02-25 LAB — CMP (CANCER CENTER ONLY)
ALBUMIN: 3.4 g/dL (ref 3.3–5.5)
ALK PHOS: 119 U/L — AB (ref 26–84)
ALT: 28 U/L (ref 10–47)
AST: 36 U/L (ref 11–38)
BUN: 12 mg/dL (ref 7–22)
CO2: 27 mEq/L (ref 18–33)
Calcium: 9 mg/dL (ref 8.0–10.3)
Chloride: 105 mEq/L (ref 98–108)
Creat: 1.1 mg/dl (ref 0.6–1.2)
Glucose, Bld: 118 mg/dL (ref 73–118)
POTASSIUM: 3.7 meq/L (ref 3.3–4.7)
Sodium: 137 mEq/L (ref 128–145)
TOTAL PROTEIN: 7.3 g/dL (ref 6.4–8.1)
Total Bilirubin: 1 mg/dl (ref 0.20–1.60)

## 2017-02-25 LAB — LACTATE DEHYDROGENASE: LDH: 246 U/L — AB (ref 125–245)

## 2017-02-25 LAB — CEA (IN HOUSE-CHCC): CEA (CHCC-In House): 526.96 ng/mL — ABNORMAL HIGH (ref 0.00–5.00)

## 2017-02-25 NOTE — Progress Notes (Signed)
Hematology and Oncology Follow Up Visit  Ryan Maynard [id number] [birth year] 66 y.o. 02/25/2017   Principle Diagnosis:  Metastatic colon cancer - K-RAS wild-type - progressive  Current Therapy:   XELOX/Avastin s/p cycle 5 - stopped on 06/30/2015 XELIRI/Erbitux - s/p c#12  (28 day cycles) - omit the Irinotecan;  Status post RFA of liver metastases Clinical Trial at [facility] - [id number] - Perjeta/Herceptin/Exp chemo XELOX/Avastin - s/p cycle #4    Interim History:  Mr. Brunton is here today for follow-up. Unfortunately, his PET scan that show progressive disease. We did a PET scan last week. It does show that there is some progression. His CEA started to go back up. His CEA had bottomed out at 281. A couple weeks, he was up to 340.  The PET scan showed that his progression was in his liver. There is no disease noted in his lungs or in the abdominal cavity.  He feels great. He feels good. He's had no abdominal pain. He's had no cough or shortness of breath. He's had no bleeding.  He does have some loose stools on occasion.  We talked at length. He really does not want to go to the 5-FU pump. He wants to increase his dose of Xeloda. He does not want Isaiah plan or Avastin today. He wants to see what the increase in the Xeloda dose will do. He was on at house milligrams twice a day. He will try 1500 mg by mouth E Eddy 14 days.  He's had no rashes. He's had no fever.  Overall, his performance status is ECOG 0.  . Medications: ( Allergies as of 02/25/2017      Reactions   Bactrim [sulfamethoxazole-trimethoprim] Diarrhea   Has taken 2 different times and both times frequent diarrhea   Lyrica [pregabalin]    "thought I was going to die"   Zofran [ondansetron Hcl] Other (See Comments)   Hiccups      Medication List       Accurate as of 02/25/17  9:40 AM. Always use your most recent med list.          amLODipine-benazepril 10-40 MG capsule Commonly known as:  LOTREL Take 1  capsule by mouth daily.   capecitabine 500 MG tablet Commonly known as:  XELODA TAKE 3 TABLETS BY MOUTH TWICE A DAY AFTER A MEAL FOR 14 DAYS ON, THEN 7 DAYS OFF.   Coenzyme Q10 10 MG capsule Take 10 mg by mouth daily.   doxycycline 100 MG tablet Commonly known as:  VIBRA-TABS Take 1 tablet (100 mg total) by mouth 2 (two) times daily.   gabapentin 100 MG capsule Commonly known as:  NEURONTIN Take 2 capsules (200 mg total) by mouth 3 (three) times daily. Pt okay to take only once a day   ibuprofen 200 MG tablet Commonly known as:  ADVIL,MOTRIN Take 200-400 mg by mouth every 6 (six) hours as needed.   lidocaine-prilocaine cream Commonly known as:  EMLA Apply 1 application topically as needed. Apply quarter sized amount to portacath site 1-2 hours prior to chemotherapy appt.  Cover with saran wrap.   loperamide 2 MG capsule Commonly known as:  IMODIUM Take 1 capsule (2 mg total) by mouth as needed for diarrhea or loose stools.   LORazepam 1 MG tablet Commonly known as:  ATIVAN Take 1 tablet (1 mg total) by mouth every 6 (six) hours as needed (NAUSEA).   oxyCODONE-acetaminophen 5-325 MG tablet Commonly known as:  ROXICET Take 1-2 tablets by mouth every  8 (eight) hours as needed for severe pain.   oxymetazoline 0.05 % nasal spray Commonly known as:  AFRIN Place 1 spray into both nostrils 2 (two) times daily as needed for congestion.   traZODone 100 MG tablet Commonly known as:  DESYREL Take 1 tablet (100 mg total) by mouth at bedtime as needed for sleep.            Discharge Care Instructions        Start     Ordered   02/25/17 0000  CBC with Differential [facility])     02/25/17 0932   02/25/17 0000  CEA     02/25/17 0932   02/25/17 0000  CMP STAT ([place] only)     02/25/17 0932   02/25/17 0000  Lactate dehydrogenase     02/25/17 0932      Allergies:  Allergies  Allergen Reactions  . Bactrim [Sulfamethoxazole-Trimethoprim]  Diarrhea    Has taken 2 different times and both times frequent diarrhea  . Lyrica [Pregabalin]     "thought I was going to die"  . Zofran [Ondansetron Hcl] Other (See Comments)    Hiccups    Past Medical History, Surgical history, Social history, and Family History were reviewed and updated.  Review of Systems: As stated in the interim history  Physical Exam:  weight is 201 lb (91.2 kg). His oral temperature is 98.3 F (36.8 C). His blood pressure is 148/76 (abnormal) and his pulse is 76. His respiration is 18 and oxygen saturation is 99%.   Wt Readings from Last 3 Encounters:  02/25/17 201 lb (91.2 kg)  02/04/17 197 lb 1.9 oz (89.4 kg)  01/14/17 196 lb (88.9 kg)    Physical Exam  Constitutional: He is oriented to person, place, and time.  HENT:  Head: Normocephalic and atraumatic.  Mouth/Throat: Oropharynx is clear and moist.  Eyes: Pupils are equal, round, and reactive to light. EOM are normal.  Neck: Normal range of motion.  Cardiovascular: Normal rate, regular rhythm and normal heart sounds.   Pulmonary/Chest: Effort normal and breath sounds normal.  Abdominal: Soft. Bowel sounds are normal.  Musculoskeletal: Normal range of motion. He exhibits no edema, tenderness or deformity.  Lymphadenopathy:    He has no cervical adenopathy.  Neurological: He is alert and oriented to person, place, and time.  Skin: Skin is warm and dry. No rash noted. No erythema.  Psychiatric: He has a normal mood and affect. His behavior is normal. Judgment and thought content normal.  Vitals reviewed.    Lab Results  Component Value Date   WBC 4.5 02/25/2017   HGB 15.4 02/25/2017   HCT 43.4 02/25/2017   MCV 94 02/25/2017   PLT 140 (L) 02/25/2017   No results found for: FERRITIN, IRON, TIBC, UIBC, IRONPCTSAT Lab Results  Component Value Date   RBC 4.61 02/25/2017   No results found for: KPAFRELGTCHN, LAMBDASER, KAPLAMBRATIO No results found for: IGGSERUM, IGA, IGMSERUM No results  found for: Odetta Pink, SPEI   Chemistry      Component Value Date/Time   NA 137 02/25/2017 0835   NA 140 12/26/2015 1013   K 3.7 02/25/2017 0835   K 4.1 12/26/2015 1013   CL 105 02/25/2017 0835   CO2 27 02/25/2017 0835   CO2 24 12/26/2015 1013   BUN 12 02/25/2017 0835   BUN 16.8 12/26/2015 1013   CREATININE 1.1 02/25/2017 0835   CREATININE 0.8 12/26/2015 1013  Component Value Date/Time   CALCIUM 9.0 02/25/2017 0835   CALCIUM 9.3 12/26/2015 1013   ALKPHOS 119 (H) 02/25/2017 0835   ALKPHOS 109 12/26/2015 1013   AST 36 02/25/2017 0835   AST 28 12/26/2015 1013   ALT 28 02/25/2017 0835   ALT 30 12/26/2015 1013   BILITOT 1.00 02/25/2017 0835   BILITOT 0.77 12/26/2015 1013     Impression and Plan: Mr. Rufener is 66 year old gentleman with metastatic colon cancer.   For right now, we will have to see how he does with the increase in Xeloda.  I told him that we could easily try combining the irinotecan and oxaliplatin. He does not want to do this. He is afraid of side effects. For Mr. Lafosse, his main priority is quality of life. I totally understand this.  He and his wife have been plans to go to the [place] in early October. He does not want to miss this.  This is a huge event for he and his wife.  He'll come back in 3 weeks. We will check his CEA at that time. This will give [place] an idea as to what else we need to do.  I offered another clinical trial. I know he's been to [facility]. We can always consider send him back to [facility] for another trial or send him to [facility] or [place]. He will think about this.  I spent about 35-40 minutes with the and his wife. I answered all of their questions. I reviewed the scans with them. I went over his lab work.    Volanda Napoleon, MD 9/25/20189:40 AM

## 2017-03-07 MED FILL — XELODA 500 MG TABLET: 500 | 21 days supply | Qty: 84 | Fill #2

## 2017-03-17 ENCOUNTER — Other Ambulatory Visit (HOSPITAL_BASED_OUTPATIENT_CLINIC_OR_DEPARTMENT_OTHER): Payer: Medicare Other

## 2017-03-17 DIAGNOSIS — C189 Malignant neoplasm of colon, unspecified: Secondary | ICD-10-CM | POA: Diagnosis present

## 2017-03-17 DIAGNOSIS — C787 Secondary malignant neoplasm of liver and intrahepatic bile duct: Principal | ICD-10-CM

## 2017-03-17 LAB — CBC WITH DIFFERENTIAL (CANCER CENTER ONLY)
BASO#: 0.1 10*3/uL (ref 0.0–0.2)
BASO%: 1.4 % (ref 0.0–2.0)
EOS ABS: 0.1 10*3/uL (ref 0.0–0.5)
EOS%: 2.7 % (ref 0.0–7.0)
HCT: 42.4 % (ref 38.7–49.9)
HGB: 15.1 g/dL (ref 13.0–17.1)
LYMPH#: 1.4 10*3/uL (ref 0.9–3.3)
LYMPH%: 30.8 % (ref 14.0–48.0)
MCH: 34.8 pg — AB (ref 28.0–33.4)
MCHC: 35.6 g/dL (ref 32.0–35.9)
MCV: 98 fL (ref 82–98)
MONO#: 0.6 10*3/uL (ref 0.1–0.9)
MONO%: 13.2 % — AB (ref 0.0–13.0)
NEUT#: 2.3 10*3/uL (ref 1.5–6.5)
NEUT%: 51.9 % (ref 40.0–80.0)
PLATELETS: 141 10*3/uL — AB (ref 145–400)
RBC: 4.35 10*6/uL (ref 4.20–5.70)
RDW: 16.4 % — AB (ref 11.1–15.7)
WBC: 4.4 10*3/uL (ref 4.0–10.0)

## 2017-03-17 LAB — CMP (CANCER CENTER ONLY)
ALT(SGPT): 25 U/L (ref 10–47)
AST: 35 U/L (ref 11–38)
Albumin: 3.7 g/dL (ref 3.3–5.5)
Alkaline Phosphatase: 114 U/L — ABNORMAL HIGH (ref 26–84)
BUN: 13 mg/dL (ref 7–22)
CHLORIDE: 104 meq/L (ref 98–108)
CO2: 30 meq/L (ref 18–33)
CREATININE: 0.8 mg/dL (ref 0.6–1.2)
Calcium: 9.6 mg/dL (ref 8.0–10.3)
GLUCOSE: 97 mg/dL (ref 73–118)
POTASSIUM: 3.7 meq/L (ref 3.3–4.7)
SODIUM: 142 meq/L (ref 128–145)
Total Bilirubin: 0.9 mg/dl (ref 0.20–1.60)
Total Protein: 7.2 g/dL (ref 6.4–8.1)

## 2017-03-17 LAB — CEA (IN HOUSE-CHCC): CEA (CHCC-In House): 648.14 ng/mL — ABNORMAL HIGH (ref 0.00–5.00)

## 2017-03-17 LAB — LACTATE DEHYDROGENASE: LDH: 243 U/L (ref 125–245)

## 2017-03-18 ENCOUNTER — Ambulatory Visit (HOSPITAL_BASED_OUTPATIENT_CLINIC_OR_DEPARTMENT_OTHER): Payer: Medicare Other | Admitting: Hematology & Oncology

## 2017-03-18 ENCOUNTER — Other Ambulatory Visit: Payer: Medicare Other

## 2017-03-18 ENCOUNTER — Ambulatory Visit (HOSPITAL_BASED_OUTPATIENT_CLINIC_OR_DEPARTMENT_OTHER): Payer: Medicare Other

## 2017-03-18 VITALS — BP 134/69 | HR 85 | Temp 98.2°F | Resp 18 | Wt 198.0 lb

## 2017-03-18 DIAGNOSIS — C189 Malignant neoplasm of colon, unspecified: Secondary | ICD-10-CM

## 2017-03-18 DIAGNOSIS — Z79899 Other long term (current) drug therapy: Secondary | ICD-10-CM | POA: Diagnosis present

## 2017-03-18 DIAGNOSIS — C787 Secondary malignant neoplasm of liver and intrahepatic bile duct: Secondary | ICD-10-CM

## 2017-03-18 DIAGNOSIS — Z5112 Encounter for antineoplastic immunotherapy: Secondary | ICD-10-CM | POA: Diagnosis not present

## 2017-03-18 LAB — UA PROTEIN, DIPSTICK - CHCC SATELLITE

## 2017-03-18 MED ORDER — HEPARIN SOD (PORK) LOCK FLUSH 100 UNIT/ML IV SOLN
500.0000 [IU] | Freq: Once | INTRAVENOUS | Status: AC | PRN
  Administered 2017-03-18: 500 [IU]
  Filled 2017-03-18: qty 5

## 2017-03-18 MED ORDER — SODIUM CHLORIDE 0.9% FLUSH
10.0000 mL | INTRAVENOUS | Status: DC | PRN
Start: 2017-03-18 — End: 2017-03-18
  Administered 2017-03-18: 10 mL
  Filled 2017-03-18: qty 10

## 2017-03-18 MED ORDER — SODIUM CHLORIDE 0.9 % IV SOLN
Freq: Once | INTRAVENOUS | Status: AC
  Administered 2017-03-18: 10:00:00 via INTRAVENOUS

## 2017-03-18 MED ORDER — SODIUM CHLORIDE 0.9 % IV SOLN
14.7000 mg/kg | Freq: Once | INTRAVENOUS | Status: AC
  Administered 2017-03-18: 1300 mg via INTRAVENOUS
  Filled 2017-03-18: qty 48

## 2017-03-18 NOTE — Progress Notes (Signed)
Hematology and Oncology Follow Up Visit  Ryan Maynard [id number] [birth year] 66 y.o. 03/18/2017   Principle Diagnosis:  Metastatic colon cancer - K-RAS wild-type - progressive  Current Therapy:   XELOX/Avastin s/p cycle 5 - stopped on 06/30/2015 XELIRI/Erbitux - s/p c#12  (28 day cycles) - omit the Irinotecan;  Status post RFA of liver metastases Clinical Trial at [facility] - [id number] - Perjeta/Herceptin/Exp chemo XELOX/Avastin - s/p cycle #5    Interim History:  Ryan Maynard is here today for follow-up. He had no problems with the increased dose of Xeloda. I am happy that he tolerated this well.  He and his wife had a wonderful time at the [facility]. They went last week. They went for one day. They really enjoyed themselves.  His back hurts a little bit. He did a lot of yard work this weekend. The lower part of his back hurts. He has some muscle spasms back there.  He's had no fever. He's had no leg swelling. He's had no erythema or peeling of his hands and feet.  His CEA level yesterday was 648. This actually has not gone up as much. Hopefully, this might be a reflection of the higher dose of Xeloda that he is taking. He is taking 1500 mg by mouth twice a day 14 days.  He's had no diarrhea. In fact, he's had a little bit of constipation.  He's had no headache. His blood pressure is doing much better.  He has had no nausea or vomiting.  Overall, his performance status is ECOG 1. . Medications: ( Allergies as of 03/18/2017      Reactions   Bactrim [sulfamethoxazole-trimethoprim] Diarrhea   Has taken 2 different times and both times frequent diarrhea   Lyrica [pregabalin]    "thought I was going to die"   Zofran [ondansetron Hcl] Other (See Comments)   Hiccups      Medication List       Accurate as of 03/18/17  9:27 AM. Always use your most recent med list.          amLODipine-benazepril 10-40 MG capsule Commonly known as:  LOTREL Take 1 capsule  by mouth daily.   capecitabine 500 MG tablet Commonly known as:  XELODA TAKE 3 TABLETS BY MOUTH TWICE A DAY AFTER A MEAL FOR 14 DAYS ON, THEN 7 DAYS OFF.   Coenzyme Q10 10 MG capsule Take 10 mg by mouth daily.   doxycycline 100 MG tablet Commonly known as:  VIBRA-TABS Take 1 tablet (100 mg total) by mouth 2 (two) times daily.   gabapentin 100 MG capsule Commonly known as:  NEURONTIN Take 2 capsules (200 mg total) by mouth 3 (three) times daily. Pt okay to take only once a day   ibuprofen 200 MG tablet Commonly known as:  ADVIL,MOTRIN Take 200-400 mg by mouth every 6 (six) hours as needed.   lidocaine-prilocaine cream Commonly known as:  EMLA Apply 1 application topically as needed. Apply quarter sized amount to portacath site 1-2 hours prior to chemotherapy appt.  Cover with saran wrap.   loperamide 2 MG capsule Commonly known as:  IMODIUM Take 1 capsule (2 mg total) by mouth as needed for diarrhea or loose stools.   LORazepam 1 MG tablet Commonly known as:  ATIVAN Take 1 tablet (1 mg total) by mouth every 6 (six) hours as needed (NAUSEA).   oxyCODONE-acetaminophen 5-325 MG tablet Commonly known as:  ROXICET Take 1-2 tablets by mouth every 8 (eight) hours as needed  for severe pain.   oxymetazoline 0.05 % nasal spray Commonly known as:  AFRIN Place 1 spray into both nostrils 2 (two) times daily as needed for congestion.   traZODone 100 MG tablet Commonly known as:  DESYREL Take 1 tablet (100 mg total) by mouth at bedtime as needed for sleep.       Allergies:  Allergies  Allergen Reactions  . Bactrim [Sulfamethoxazole-Trimethoprim] Diarrhea    Has taken 2 different times and both times frequent diarrhea  . Lyrica [Pregabalin]     "thought I was going to die"  . Zofran [Ondansetron Hcl] Other (See Comments)    Hiccups    Past Medical History, Surgical history, Social history, and Family History were reviewed and updated.  Review of Systems: As stated in the  interim history  Physical Exam:  weight is 198 lb (89.8 kg). His oral temperature is 98.2 F (36.8 C). His blood pressure is 134/69 and his pulse is 85. His respiration is 18 and oxygen saturation is 99%.   Wt Readings from Last 3 Encounters:  03/18/17 198 lb (89.8 kg)  02/25/17 201 lb (91.2 kg)  02/04/17 197 lb 1.9 oz (89.4 kg)    Physical Exam  Constitutional: He is oriented to person, place, and time.  HENT:  Head: Normocephalic and atraumatic.  Mouth/Throat: Oropharynx is clear and moist.  Eyes: Pupils are equal, round, and reactive to light. EOM are normal.  Neck: Normal range of motion.  Cardiovascular: Normal rate, regular rhythm and normal heart sounds.   Pulmonary/Chest: Effort normal and breath sounds normal.  Abdominal: Soft. Bowel sounds are normal.  Musculoskeletal: Normal range of motion. He exhibits no edema, tenderness or deformity.  Lymphadenopathy:    He has no cervical adenopathy.  Neurological: He is alert and oriented to person, place, and time.  Skin: Skin is warm and dry. No rash noted. No erythema.  Psychiatric: He has a normal mood and affect. His behavior is normal. Judgment and thought content normal.  Vitals reviewed.    Lab Results  Component Value Date   WBC 4.4 03/17/2017   HGB 15.1 03/17/2017   HCT 42.4 03/17/2017   MCV 98 03/17/2017   PLT 141 (L) 03/17/2017   No results found for: FERRITIN, IRON, TIBC, UIBC, IRONPCTSAT Lab Results  Component Value Date   RBC 4.35 03/17/2017   No results found for: KPAFRELGTCHN, LAMBDASER, KAPLAMBRATIO No results found for: IGGSERUM, IGA, IGMSERUM No results found for: Odetta Pink, SPEI   Chemistry      Component Value Date/Time   NA 142 03/17/2017 0755   NA 140 12/26/2015 1013   K 3.7 03/17/2017 0755   K 4.1 12/26/2015 1013   CL 104 03/17/2017 0755   CO2 30 03/17/2017 0755   CO2 24 12/26/2015 1013   BUN 13 03/17/2017 0755   BUN 16.8  12/26/2015 1013   CREATININE 0.8 03/17/2017 0755   CREATININE 0.8 12/26/2015 1013      Component Value Date/Time   CALCIUM 9.6 03/17/2017 0755   CALCIUM 9.3 12/26/2015 1013   ALKPHOS 114 (H) 03/17/2017 0755   ALKPHOS 109 12/26/2015 1013   AST 35 03/17/2017 0755   AST 28 12/26/2015 1013   ALT 25 03/17/2017 0755   ALT 30 12/26/2015 1013   BILITOT 0.90 03/17/2017 0755   BILITOT 0.77 12/26/2015 1013     Impression and Plan: Mr. Medlen is 66 year old gentleman with metastatic colon cancer.   At this point, we  will add back Avastin. I thought about adding back Cyramza. This is a "second-generation" Avastin. However, I think that Avastin with the higher dose of Xeloda might also help [place].  I want to hold off on adding back oxaliplatin right now. We will see what his next CEA level is.  We will plan to get him back in 3 weeks.  He will come in the day before for his lab work.  I'm so glad that his quality of life is doing so well right now. As always, this is been the most important factor for him.   Volanda Napoleon, MD 10/16/20189:27 AM

## 2017-03-18 NOTE — Patient Instructions (Signed)
[facility] Discharge Instructions for Patients Receiving Chemotherapy  Today you received the following chemotherapy agents Avastin.   To help prevent nausea and vomiting after your treatment, we encourage you to take your nausea medication as prescribed.    If you develop nausea and vomiting that is not controlled by your nausea medication, call the clinic.   BELOW ARE SYMPTOMS THAT SHOULD BE REPORTED IMMEDIATELY:  *FEVER GREATER THAN 100.5 F  *CHILLS WITH OR WITHOUT FEVER  NAUSEA AND VOMITING THAT IS NOT CONTROLLED WITH YOUR NAUSEA MEDICATION  *UNUSUAL SHORTNESS OF BREATH  *UNUSUAL BRUISING OR BLEEDING  TENDERNESS IN MOUTH AND THROAT WITH OR WITHOUT PRESENCE OF ULCERS  *URINARY PROBLEMS  *BOWEL PROBLEMS  UNUSUAL RASH Items with * indicate a potential emergency and should be followed up as soon as possible.  Feel free to call the clinic should you have any questions or concerns. The clinic phone number is [phone].  Please show the CHEMO ALERT CARD at check-in to the Emergency Department and triage nurse.   

## 2017-04-03 ENCOUNTER — Telehealth: Payer: Self-pay | Admitting: Pharmacist

## 2017-04-03 NOTE — Telephone Encounter (Signed)
Oral Chemotherapy Pharmacist Encounter   Attempted to reach patient for follow up on oral medication: Xeloda. No answer. Left VM for patient to call back.   Darl Pikes, PharmD, BCPS Hematology/Oncology Clinical Pharmacist [facility] [phone]  04/03/2017 4:04 PM

## 2017-04-07 MED FILL — XELODA 500 MG TABLET: 500 | 21 days supply | Qty: 84 | Fill #3

## 2017-04-07 NOTE — Telephone Encounter (Signed)
Oral Chemotherapy Pharmacist Encounter  Attempted to reach patient for follow up on oral medication: Xeloda. No answer. Left VM for patient to call back.   Darl Pikes, PharmD, BCPS Hematology/Oncology Clinical Pharmacist [facility] [phone]  04/07/2017 2:36 PM

## 2017-04-08 ENCOUNTER — Ambulatory Visit (HOSPITAL_BASED_OUTPATIENT_CLINIC_OR_DEPARTMENT_OTHER): Payer: Medicare Other | Admitting: Hematology & Oncology

## 2017-04-08 ENCOUNTER — Ambulatory Visit (HOSPITAL_BASED_OUTPATIENT_CLINIC_OR_DEPARTMENT_OTHER): Payer: Medicare Other

## 2017-04-08 ENCOUNTER — Encounter: Payer: Self-pay | Admitting: Hematology & Oncology

## 2017-04-08 ENCOUNTER — Other Ambulatory Visit (HOSPITAL_BASED_OUTPATIENT_CLINIC_OR_DEPARTMENT_OTHER): Payer: Medicare Other

## 2017-04-08 VITALS — BP 141/71 | HR 82 | Temp 98.0°F | Resp 17 | Wt 200.0 lb

## 2017-04-08 DIAGNOSIS — C189 Malignant neoplasm of colon, unspecified: Secondary | ICD-10-CM

## 2017-04-08 DIAGNOSIS — Z5112 Encounter for antineoplastic immunotherapy: Secondary | ICD-10-CM

## 2017-04-08 DIAGNOSIS — C787 Secondary malignant neoplasm of liver and intrahepatic bile duct: Secondary | ICD-10-CM

## 2017-04-08 DIAGNOSIS — Z23 Encounter for immunization: Secondary | ICD-10-CM | POA: Diagnosis not present

## 2017-04-08 LAB — CMP (CANCER CENTER ONLY)
ALT(SGPT): 21 U/L (ref 10–47)
AST: 34 U/L (ref 11–38)
Albumin: 3.5 g/dL (ref 3.3–5.5)
Alkaline Phosphatase: 107 U/L — ABNORMAL HIGH (ref 26–84)
BUN, Bld: 11 mg/dL (ref 7–22)
CHLORIDE: 103 meq/L (ref 98–108)
CO2: 28 meq/L (ref 18–33)
CREATININE: 1 mg/dL (ref 0.6–1.2)
Calcium: 8.7 mg/dL (ref 8.0–10.3)
GLUCOSE: 132 mg/dL — AB (ref 73–118)
POTASSIUM: 3.6 meq/L (ref 3.3–4.7)
SODIUM: 140 meq/L (ref 128–145)
Total Bilirubin: 0.9 mg/dl (ref 0.20–1.60)
Total Protein: 7.2 g/dL (ref 6.4–8.1)

## 2017-04-08 LAB — CBC WITH DIFFERENTIAL (CANCER CENTER ONLY)
BASO#: 0 10*3/uL (ref 0.0–0.2)
BASO%: 0.8 % (ref 0.0–2.0)
EOS ABS: 0.1 10*3/uL (ref 0.0–0.5)
EOS%: 1.8 % (ref 0.0–7.0)
HCT: 41.8 % (ref 38.7–49.9)
HGB: 14.8 g/dL (ref 13.0–17.1)
LYMPH#: 1 10*3/uL (ref 0.9–3.3)
LYMPH%: 20.2 % (ref 14.0–48.0)
MCH: 35.3 pg — AB (ref 28.0–33.4)
MCHC: 35.4 g/dL (ref 32.0–35.9)
MCV: 100 fL — AB (ref 82–98)
MONO#: 0.7 10*3/uL (ref 0.1–0.9)
MONO%: 13 % (ref 0.0–13.0)
NEUT#: 3.2 10*3/uL (ref 1.5–6.5)
NEUT%: 64.2 % (ref 40.0–80.0)
PLATELETS: 126 10*3/uL — AB (ref 145–400)
RBC: 4.19 10*6/uL — ABNORMAL LOW (ref 4.20–5.70)
RDW: 16.1 % — AB (ref 11.1–15.7)
WBC: 5 10*3/uL (ref 4.0–10.0)

## 2017-04-08 LAB — CEA (IN HOUSE-CHCC): CEA (CHCC-In House): 791.21 ng/mL — ABNORMAL HIGH (ref 0.00–5.00)

## 2017-04-08 LAB — LACTATE DEHYDROGENASE: LDH: 246 U/L — AB (ref 125–245)

## 2017-04-08 MED ORDER — HEPARIN SOD (PORK) LOCK FLUSH 100 UNIT/ML IV SOLN
500.0000 [IU] | Freq: Once | INTRAVENOUS | Status: AC | PRN
Start: 2017-04-08 — End: 2017-04-08
  Administered 2017-04-08: 500 [IU]
  Filled 2017-04-08: qty 5

## 2017-04-08 MED ORDER — SODIUM CHLORIDE 0.9 % IV SOLN
Freq: Once | INTRAVENOUS | Status: AC
  Administered 2017-04-08: 10:00:00 via INTRAVENOUS

## 2017-04-08 MED ORDER — INFLUENZA VAC SPLIT QUAD 0.5 ML IM SUSY
PREFILLED_SYRINGE | INTRAMUSCULAR | Status: AC
  Filled 2017-04-08: qty 0.5

## 2017-04-08 MED ORDER — BEVACIZUMAB CHEMO INJECTION 400 MG/16ML
14.5000 mg/kg | Freq: Once | INTRAVENOUS | Status: AC
  Administered 2017-04-08: 1300 mg via INTRAVENOUS
  Filled 2017-04-08: qty 48

## 2017-04-08 MED ORDER — SODIUM CHLORIDE 0.9% FLUSH
10.0000 mL | INTRAVENOUS | Status: DC | PRN
  Administered 2017-04-08: 10 mL
  Filled 2017-04-08: qty 10

## 2017-04-08 MED ORDER — INFLUENZA VAC SPLIT QUAD 0.5 ML IM SUSY
0.5000 mL | PREFILLED_SYRINGE | Freq: Once | INTRAMUSCULAR | Status: AC
  Administered 2017-04-08: 0.5 mL via INTRAMUSCULAR

## 2017-04-08 NOTE — Patient Instructions (Signed)
[facility] Discharge Instructions for Patients Receiving Chemotherapy  Today you received the following chemotherapy agents:  Avastin  To help prevent nausea and vomiting after your treatment, we encourage you to take your nausea medication as ordered per MD.    If you develop nausea and vomiting that is not controlled by your nausea medication, call the clinic.   BELOW ARE SYMPTOMS THAT SHOULD BE REPORTED IMMEDIATELY:  *FEVER GREATER THAN 100.5 F  *CHILLS WITH OR WITHOUT FEVER  NAUSEA AND VOMITING THAT IS NOT CONTROLLED WITH YOUR NAUSEA MEDICATION  *UNUSUAL SHORTNESS OF BREATH  *UNUSUAL BRUISING OR BLEEDING  TENDERNESS IN MOUTH AND THROAT WITH OR WITHOUT PRESENCE OF ULCERS  *URINARY PROBLEMS  *BOWEL PROBLEMS  UNUSUAL RASH Items with * indicate a potential emergency and should be followed up as soon as possible.  Feel free to call the clinic should you have any questions or concerns. The clinic phone number is [phone].  Please show the [facility] at check-in to the Emergency Department and triage nurse.

## 2017-04-08 NOTE — Progress Notes (Signed)
Hematology and Oncology Follow Up Visit  Ryan Maynard [id number] 11/02/1950 66 y.o. 04/08/2017   Principle Diagnosis:  Metastatic colon cancer - K-RAS wild-type - progressive  Current Therapy:   XELOX/Avastin s/p cycle 5 - stopped on 06/30/2015 XELIRI/Erbitux - s/p c#12  (28 day cycles) - omit the Irinotecan;  Status post RFA of liver metastases Clinical Trial at [facility] - [id number] - Perjeta/Herceptin/Exp chemo XELOX/Avastin - s/p cycle #6 -oxaliplatin on hold    Interim History:  Ryan Maynard is here today for follow-up.  He is looking good.  He feels good.  He, like me, is tired of the rain.  He still is having some back issues.  This is intermittent.  He is still doing some yard work.  He says that his back feels better once he is able to move around and have some activity.  There is been no nausea or vomiting.  He is eating well.  He has had no nausea or vomiting.  He has had no diarrhea although he does have some loose stools.  His last CEA was 648.  This is up a little bit more.  Hopefully, we will see this next CEA being stable or better now that the Avastin has been added back.  His blood pressure is doing well.  He has had no bleeding.  He has had no mouth sores.  He has had no fever.  Overall, his performance status is ECOG 1. . Medications: ( Allergies as of 04/08/2017      Reactions   Bactrim [sulfamethoxazole-trimethoprim] Diarrhea   Has taken 2 different times and both times frequent diarrhea   Lyrica [pregabalin]    "thought I was going to die"   Zofran [ondansetron Hcl] Other (See Comments)   Hiccups      Medication List        Accurate as of 04/08/17  9:06 AM. Always use your most recent med list.          amLODipine-benazepril 10-40 MG capsule Commonly known as:  LOTREL Take 1 capsule by mouth daily.   capecitabine 500 MG tablet Commonly known as:  XELODA TAKE 3 TABLETS BY MOUTH TWICE A DAY AFTER A MEAL FOR 14 DAYS ON, THEN 7 DAYS OFF.     Coenzyme Q10 10 MG capsule Take 10 mg by mouth daily.   doxycycline 100 MG tablet Commonly known as:  VIBRA-TABS Take 1 tablet (100 mg total) by mouth 2 (two) times daily.   gabapentin 100 MG capsule Commonly known as:  NEURONTIN Take 2 capsules (200 mg total) by mouth 3 (three) times daily. Pt okay to take only once a day   ibuprofen 200 MG tablet Commonly known as:  ADVIL,MOTRIN Take 200-400 mg by mouth every 6 (six) hours as needed.   lidocaine-prilocaine cream Commonly known as:  EMLA Apply 1 application topically as needed. Apply quarter sized amount to portacath site 1-2 hours prior to chemotherapy appt.  Cover with saran wrap.   loperamide 2 MG capsule Commonly known as:  IMODIUM Take 1 capsule (2 mg total) by mouth as needed for diarrhea or loose stools.   LORazepam 1 MG tablet Commonly known as:  ATIVAN Take 1 tablet (1 mg total) by mouth every 6 (six) hours as needed (NAUSEA).   oxyCODONE-acetaminophen 5-325 MG tablet Commonly known as:  ROXICET Take 1-2 tablets by mouth every 8 (eight) hours as needed for severe pain.   oxymetazoline 0.05 % nasal spray Commonly known as:  AFRIN Place 1 spray  into both nostrils 2 (two) times daily as needed for congestion.   traZODone 100 MG tablet Commonly known as:  DESYREL Take 1 tablet (100 mg total) by mouth at bedtime as needed for sleep.       Allergies:  Allergies  Allergen Reactions  . Bactrim [Sulfamethoxazole-Trimethoprim] Diarrhea    Has taken 2 different times and both times frequent diarrhea  . Lyrica [Pregabalin]     "thought I was going to die"  . Zofran [Ondansetron Hcl] Other (See Comments)    Hiccups    Past Medical History, Surgical history, Social history, and Family History were reviewed and updated.  Review of Systems: As stated in the interim history  Physical Exam:  weight is 200 lb (90.7 kg). His oral temperature is 98 F (36.7 C). His blood pressure is 141/71 (abnormal) and his pulse  is 82. His respiration is 17 and oxygen saturation is 98%.   Wt Readings from Last 3 Encounters:  04/08/17 200 lb (90.7 kg)  03/18/17 198 lb (89.8 kg)  02/25/17 201 lb (91.2 kg)    Physical Exam  Constitutional: He is oriented to person, place, and time.  HENT:  Head: Normocephalic and atraumatic.  Mouth/Throat: Oropharynx is clear and moist.  Eyes: EOM are normal. Pupils are equal, round, and reactive to light.  Neck: Normal range of motion.  Cardiovascular: Normal rate, regular rhythm and normal heart sounds.  Pulmonary/Chest: Effort normal and breath sounds normal.  Abdominal: Soft. Bowel sounds are normal.  Musculoskeletal: Normal range of motion. He exhibits no edema, tenderness or deformity.  Lymphadenopathy:    He has no cervical adenopathy.  Neurological: He is alert and oriented to person, place, and time.  Skin: Skin is warm and dry. No rash noted. No erythema.  Psychiatric: He has a normal mood and affect. His behavior is normal. Judgment and thought content normal.  Vitals reviewed.    Lab Results  Component Value Date   WBC 5.0 04/08/2017   HGB 14.8 04/08/2017   HCT 41.8 04/08/2017   MCV 100 (H) 04/08/2017   PLT 126 (L) 04/08/2017   No results found for: FERRITIN, IRON, TIBC, UIBC, IRONPCTSAT Lab Results  Component Value Date   RBC 4.19 (L) 04/08/2017   No results found for: KPAFRELGTCHN, LAMBDASER, KAPLAMBRATIO No results found for: IGGSERUM, IGA, IGMSERUM No results found for: Odetta Pink, SPEI   Chemistry      Component Value Date/Time   NA 142 03/17/2017 0755   NA 140 12/26/2015 1013   K 3.7 03/17/2017 0755   K 4.1 12/26/2015 1013   CL 104 03/17/2017 0755   CO2 30 03/17/2017 0755   CO2 24 12/26/2015 1013   BUN 13 03/17/2017 0755   BUN 16.8 12/26/2015 1013   CREATININE 0.8 03/17/2017 0755   CREATININE 0.8 12/26/2015 1013      Component Value Date/Time   CALCIUM 9.6 03/17/2017 0755    CALCIUM 9.3 12/26/2015 1013   ALKPHOS 114 (H) 03/17/2017 0755   ALKPHOS 109 12/26/2015 1013   AST 35 03/17/2017 0755   AST 28 12/26/2015 1013   ALT 25 03/17/2017 0755   ALT 30 12/26/2015 1013   BILITOT 0.90 03/17/2017 0755   BILITOT 0.77 12/26/2015 1013     Impression and Plan: Ryan Maynard is 66 year old gentleman with metastatic colon cancer.   Unfortunately, his CEA today is up further.  It is now 790.  I talked to him about adding oxaliplatin back to  the Xeloda/Avastin.  He is just not too keen on oxaliplatin.  I definitely understand this.  For Ryan Maynard, it is always been about his quality of life.  I think that he might be more amenable to the oxaliplatin.  I would have to make a dosage reduction to allow him to have minimal toxicity.  Again, he has a good performance status.  He really has done nicely.  We will see him back in 3 weeks.  This will then allow some time to see how he feels and whether or not he wants to add back the oxaliplatin.  I am sure his wife would want him to do this.  Again, he is more worried about his quality of life than quantity of life.    I spent about 30 minutes with he and his wife.  It is always so much fun to talk with him.     Volanda Napoleon, MD 11/6/20189:06 AM

## 2017-04-09 NOTE — Telephone Encounter (Signed)
Oral Chemotherapy Pharmacist Encounter  Follow-Up Form  Called patient's wife Zigmund Daniel today to follow up regarding patient's oral chemotherapy medication: Xeloda (capecitabine)  Original Start date of oral chemotherapy: 12/24/16. He recently went back up on his Xeloda and per his wife he is tolerating it well.  Pt reports occasionally missing doses of Xeloda missed in the last cycle. Missed dose(s) attributed to: simply forgetting. Gave them tips for things they could do to increase adherence, ie putting the medication near where he eats as a reminder or using a calendar to cross out the days as he takes his medicine.  Reviewed plan for missed doses.   Pt reports the following side effects: N/A  Recent labs reviewed: CBC/CMP from 04/08/17 reviewed, no relevant lab abnormalities  Other Issues: N/A  Patient knows to call the office with questions or concerns. Oral Oncology Clinic will continue to follow.  Darl Pikes, PharmD, BCPS Hematology/Oncology Clinical Pharmacist [facility] [phone]  04/09/2017 10:04 AM

## 2017-04-29 ENCOUNTER — Other Ambulatory Visit (HOSPITAL_BASED_OUTPATIENT_CLINIC_OR_DEPARTMENT_OTHER): Payer: Medicare Other

## 2017-04-29 ENCOUNTER — Ambulatory Visit (HOSPITAL_BASED_OUTPATIENT_CLINIC_OR_DEPARTMENT_OTHER): Payer: Medicare Other

## 2017-04-29 ENCOUNTER — Encounter: Payer: Self-pay | Admitting: Hematology & Oncology

## 2017-04-29 ENCOUNTER — Other Ambulatory Visit: Payer: Self-pay

## 2017-04-29 ENCOUNTER — Ambulatory Visit (HOSPITAL_BASED_OUTPATIENT_CLINIC_OR_DEPARTMENT_OTHER): Payer: Medicare Other | Admitting: Hematology & Oncology

## 2017-04-29 VITALS — BP 129/83

## 2017-04-29 VITALS — BP 158/79 | HR 88 | Temp 98.2°F | Resp 18 | Wt 198.0 lb

## 2017-04-29 DIAGNOSIS — C787 Secondary malignant neoplasm of liver and intrahepatic bile duct: Secondary | ICD-10-CM

## 2017-04-29 DIAGNOSIS — C189 Malignant neoplasm of colon, unspecified: Secondary | ICD-10-CM

## 2017-04-29 DIAGNOSIS — Z5112 Encounter for antineoplastic immunotherapy: Secondary | ICD-10-CM | POA: Diagnosis not present

## 2017-04-29 LAB — CMP (CANCER CENTER ONLY)
ALBUMIN: 3.6 g/dL (ref 3.3–5.5)
ALK PHOS: 128 U/L — AB (ref 26–84)
ALT: 22 U/L (ref 10–47)
AST: 32 U/L (ref 11–38)
BILIRUBIN TOTAL: 1.1 mg/dL (ref 0.20–1.60)
BUN, Bld: 11 mg/dL (ref 7–22)
CO2: 27 mEq/L (ref 18–33)
CREATININE: 0.9 mg/dL (ref 0.6–1.2)
Calcium: 9.1 mg/dL (ref 8.0–10.3)
Chloride: 102 mEq/L (ref 98–108)
Glucose, Bld: 126 mg/dL — ABNORMAL HIGH (ref 73–118)
Potassium: 3.4 mEq/L (ref 3.3–4.7)
SODIUM: 138 meq/L (ref 128–145)
TOTAL PROTEIN: 7.1 g/dL (ref 6.4–8.1)

## 2017-04-29 LAB — CBC WITH DIFFERENTIAL (CANCER CENTER ONLY)
BASO#: 0 10*3/uL (ref 0.0–0.2)
BASO%: 0.4 % (ref 0.0–2.0)
EOS%: 1.5 % (ref 0.0–7.0)
Eosinophils Absolute: 0.1 10*3/uL (ref 0.0–0.5)
HCT: 41.9 % (ref 38.7–49.9)
HGB: 15.1 g/dL (ref 13.0–17.1)
LYMPH#: 0.8 10*3/uL — ABNORMAL LOW (ref 0.9–3.3)
LYMPH%: 18.1 % (ref 14.0–48.0)
MCH: 36 pg — ABNORMAL HIGH (ref 28.0–33.4)
MCHC: 36 g/dL — ABNORMAL HIGH (ref 32.0–35.9)
MCV: 100 fL — ABNORMAL HIGH (ref 82–98)
MONO#: 0.6 10*3/uL (ref 0.1–0.9)
MONO%: 12.5 % (ref 0.0–13.0)
NEUT#: 3.1 10*3/uL (ref 1.5–6.5)
NEUT%: 67.5 % (ref 40.0–80.0)
Platelets: 116 10*3/uL — ABNORMAL LOW (ref 145–400)
RBC: 4.19 10*6/uL — ABNORMAL LOW (ref 4.20–5.70)
RDW: 15.8 % — ABNORMAL HIGH (ref 11.1–15.7)
WBC: 4.7 10*3/uL (ref 4.0–10.0)

## 2017-04-29 LAB — LACTATE DEHYDROGENASE: LDH: 271 U/L — AB (ref 125–245)

## 2017-04-29 MED ORDER — SODIUM CHLORIDE 0.9 % IV SOLN
14.5000 mg/kg | Freq: Once | INTRAVENOUS | Status: AC
  Administered 2017-04-29: 1300 mg via INTRAVENOUS
  Filled 2017-04-29: qty 48

## 2017-04-29 MED ORDER — SODIUM CHLORIDE 0.9% FLUSH
10.0000 mL | INTRAVENOUS | Status: DC | PRN
  Administered 2017-04-29: 10 mL
  Filled 2017-04-29: qty 10

## 2017-04-29 MED ORDER — HEPARIN SOD (PORK) LOCK FLUSH 100 UNIT/ML IV SOLN
500.0000 [IU] | Freq: Once | INTRAVENOUS | Status: AC | PRN
  Administered 2017-04-29: 500 [IU]
  Filled 2017-04-29: qty 5

## 2017-04-29 MED ORDER — SODIUM CHLORIDE 0.9 % IV SOLN
Freq: Once | INTRAVENOUS | Status: AC
  Administered 2017-04-29: 14:00:00 via INTRAVENOUS

## 2017-04-29 MED ORDER — OXYCODONE HCL 5 MG PO TABS: 5.0000 mg | ORAL_TABLET | Freq: Four times a day (QID) | ORAL | 0 refills | Status: DC | PRN

## 2017-04-29 NOTE — Progress Notes (Signed)
Hematology and Oncology Follow Up Visit  Ryan Maynard [id number] 1951/05/21 66 y.o. 04/29/2017   Principle Diagnosis:  Metastatic colon cancer - K-RAS wild-type - progressive  Current Therapy:   XELOX/Avastin s/p cycle 5 - stopped on 06/30/2015 XELIRI/Erbitux - s/p c#12  (28 day cycles) - omit the Irinotecan;  Status post RFA of liver metastases Clinical Trial at [facility] - [id number] - Perjeta/Herceptin/Exp chemo XELOX/Avastin - s/p cycle #6 -oxaliplatin on hold    Interim History:  [name] is here today for follow-up.  Unfortunately, his CEA continues to climb.  His last CEA was 791.  [name] does not want to add oxaliplatin back to his protocol.  He is just worried about the side effects that he may have that would affect him in joint Ryan Maynard with his wife and family.  He had a tough Thanksgiving.  His stomach was "rumbling."  He had no nausea or vomiting.  He had no diarrhea.  He has not been constipated.  He has had no bleeding.  He has had no cough or shortness of breath.  He has not been able to do yard work because of all the rain that we had.  He has having some pain.  The pain is in his back.  He has had this in the past.  This might be musculoskeletal.  Also could be from his liver metastasis.  Oxycodone has worked well for him.  I went ahead and refilled this.  He has had no headaches.  He has had no mouth sores.  He is still taking 1500 mg twice daily of the Xeloda.  He does not want to increase that dose.   Overall, his performance status is ECOG 1. . Medications: ( Allergies as of 04/29/2017      Reactions   Bactrim [sulfamethoxazole-trimethoprim] Diarrhea   Has taken 2 different times and both times frequent diarrhea   Ondansetron Hcl Other (See Comments)   Hiccups Hiccups   Pregabalin Other (See Comments)   "thought I was going to die" "thought I was going to die"      Medication List        Accurate as of 04/29/17  1:24 PM. Always use  your most recent med list.          amLODipine-benazepril 10-40 MG capsule Commonly known as:  LOTREL Take 1 capsule by mouth daily.   capecitabine 500 MG tablet Commonly known as:  XELODA TAKE 3 TABLETS BY MOUTH TWICE A DAY AFTER A MEAL FOR 14 DAYS ON, THEN 7 DAYS OFF.   Coenzyme Q10 10 MG capsule Take 10 mg by mouth daily.   doxycycline 100 MG tablet Commonly known as:  VIBRA-TABS Take 1 tablet (100 mg total) by mouth 2 (two) times daily.   gabapentin 100 MG capsule Commonly known as:  NEURONTIN Take 2 capsules (200 mg total) by mouth 3 (three) times daily. Pt okay to take only once a day   ibuprofen 200 MG tablet Commonly known as:  ADVIL,MOTRIN Take 200-400 mg by mouth every 6 (six) hours as needed.   lidocaine-prilocaine cream Commonly known as:  EMLA Apply 1 application topically as needed. Apply quarter sized amount to portacath site 1-2 hours prior to chemotherapy appt.  Cover with saran wrap.   loperamide 2 MG capsule Commonly known as:  IMODIUM Take 1 capsule (2 mg total) by mouth as needed for diarrhea or loose stools.   LORazepam 1 MG tablet Commonly known as:  ATIVAN Take 1 tablet (  1 mg total) by mouth every 6 (six) hours as needed (NAUSEA).   oxyCODONE-acetaminophen 5-325 MG tablet Commonly known as:  ROXICET Take 1-2 tablets by mouth every 8 (eight) hours as needed for severe pain.   oxymetazoline 0.05 % nasal spray Commonly known as:  AFRIN Place 1 spray into both nostrils 2 (two) times daily as needed for congestion.   traZODone 100 MG tablet Commonly known as:  DESYREL Take 1 tablet (100 mg total) by mouth at bedtime as needed for sleep.       Allergies:  Allergies  Allergen Reactions  . Bactrim [Sulfamethoxazole-Trimethoprim] Diarrhea    Has taken 2 different times and both times frequent diarrhea  . Ondansetron Hcl Other (See Comments)    Hiccups Hiccups  . Pregabalin Other (See Comments)    "thought I was going to die" "thought I  was going to die"    Past Medical History, Surgical history, Social history, and Family History were reviewed and updated.  Review of Systems: As stated above in the interim history.  Physical Exam:  weight is 198 lb (89.8 kg). His oral temperature is 98.2 F (36.8 C). His blood pressure is 158/79 (abnormal) and his pulse is 88. His respiration is 18 and oxygen saturation is 97%.   Wt Readings from Last 3 Encounters:  04/29/17 198 lb (89.8 kg)  04/08/17 200 lb (90.7 kg)  03/18/17 198 lb (89.8 kg)    Physical Exam  Constitutional: He is oriented to person, place, and time.  HENT:  Head: Normocephalic and atraumatic.  Mouth/Throat: Oropharynx is clear and moist.  Eyes: EOM are normal. Pupils are equal, round, and reactive to light.  Neck: Normal range of motion.  Cardiovascular: Normal rate, regular rhythm and normal heart sounds.  Pulmonary/Chest: Effort normal and breath sounds normal.  Abdominal: Soft. Bowel sounds are normal.  Musculoskeletal: Normal range of motion. He exhibits no edema, tenderness or deformity.  Lymphadenopathy:    He has no cervical adenopathy.  Neurological: He is alert and oriented to person, place, and time.  Skin: Skin is warm and dry. No rash noted. No erythema.  Psychiatric: He has a normal mood and affect. His behavior is normal. Judgment and thought content normal.  Vitals reviewed.    Lab Results  Component Value Date   WBC 4.7 04/29/2017   HGB 15.1 04/29/2017   HCT 41.9 04/29/2017   MCV 100 (H) 04/29/2017   PLT 116 (L) 04/29/2017   No results found for: FERRITIN, IRON, TIBC, UIBC, IRONPCTSAT Lab Results  Component Value Date   RBC 4.19 (L) 04/29/2017   No results found for: KPAFRELGTCHN, LAMBDASER, KAPLAMBRATIO No results found for: IGGSERUM, IGA, IGMSERUM No results found for: Odetta Pink, SPEI   Chemistry      Component Value Date/Time   NA 138 04/29/2017 1203   NA 140  12/26/2015 1013   K 3.4 04/29/2017 1203   K 4.1 12/26/2015 1013   CL 102 04/29/2017 1203   CO2 27 04/29/2017 1203   CO2 24 12/26/2015 1013   BUN 11 04/29/2017 1203   BUN 16.8 12/26/2015 1013   CREATININE 0.9 04/29/2017 1203   CREATININE 0.8 12/26/2015 1013      Component Value Date/Time   CALCIUM 9.1 04/29/2017 1203   CALCIUM 9.3 12/26/2015 1013   ALKPHOS 128 (H) 04/29/2017 1203   ALKPHOS 109 12/26/2015 1013   AST 32 04/29/2017 1203   AST 28 12/26/2015 1013   ALT 22  04/29/2017 1203   ALT 30 12/26/2015 1013   BILITOT 1.10 04/29/2017 1203   BILITOT 0.77 12/26/2015 1013     Impression and Plan: Mr. Lehnen is 66 year old gentleman with metastatic colon cancer.   Again, I am relying on Mr. Guidice desires to have a good quality of life and not be "knocked down" by chemotherapy and possibly have a response but yet just exist.  I very much appreciate his decisiveness with respect to what is important for him with his treatments.  I suspect that his CEA will continue to rise.  However, he still has a good quality of life.  His performance status is still decent.  I will plan to get him back in 3 more weeks.  We will see how things look.  We should be able to get him through the Ryan Maynard and New Year's holiday.  Hopefully at that point, we will then be able to reintroduce the oxaliplatin.   I spent about 30 minutes with him today.  I reviewed all of his labs.  He does want to refill the oxycodone.  I went ahead and refilled the oxycodone for him so that he will not have too much pain.     Volanda Napoleon, MD 11/27/20181:24 PM

## 2017-04-29 NOTE — Patient Instructions (Signed)
[facility] Discharge Instructions for Patients Receiving Chemotherapy  Today you received the following chemotherapy agents Avastin.   To help prevent nausea and vomiting after your treatment, we encourage you to take your nausea medication as prescribed.    If you develop nausea and vomiting that is not controlled by your nausea medication, call the clinic.   BELOW ARE SYMPTOMS THAT SHOULD BE REPORTED IMMEDIATELY:  *FEVER GREATER THAN 100.5 F  *CHILLS WITH OR WITHOUT FEVER  NAUSEA AND VOMITING THAT IS NOT CONTROLLED WITH YOUR NAUSEA MEDICATION  *UNUSUAL SHORTNESS OF BREATH  *UNUSUAL BRUISING OR BLEEDING  TENDERNESS IN MOUTH AND THROAT WITH OR WITHOUT PRESENCE OF ULCERS  *URINARY PROBLEMS  *BOWEL PROBLEMS  UNUSUAL RASH Items with * indicate a potential emergency and should be followed up as soon as possible.  Feel free to call the clinic should you have any questions or concerns. The clinic phone number is [phone].  Please show the CHEMO ALERT CARD at check-in to the Emergency Department and triage nurse.   

## 2017-04-30 LAB — CEA (IN HOUSE-CHCC): CEA (CHCC-IN HOUSE): 928.42 ng/mL — AB (ref 0.00–5.00)

## 2017-04-30 MED FILL — XELODA 500 MG TABLET: 500 | 21 days supply | Qty: 84 | Fill #4

## 2017-05-20 ENCOUNTER — Other Ambulatory Visit (HOSPITAL_BASED_OUTPATIENT_CLINIC_OR_DEPARTMENT_OTHER): Payer: Medicare Other

## 2017-05-20 ENCOUNTER — Encounter: Payer: Self-pay | Admitting: Hematology & Oncology

## 2017-05-20 ENCOUNTER — Other Ambulatory Visit: Payer: Self-pay | Admitting: Oncology

## 2017-05-20 ENCOUNTER — Other Ambulatory Visit: Payer: Self-pay

## 2017-05-20 ENCOUNTER — Ambulatory Visit (HOSPITAL_BASED_OUTPATIENT_CLINIC_OR_DEPARTMENT_OTHER): Payer: Medicare Other

## 2017-05-20 ENCOUNTER — Ambulatory Visit (HOSPITAL_BASED_OUTPATIENT_CLINIC_OR_DEPARTMENT_OTHER): Payer: Medicare Other | Admitting: Hematology & Oncology

## 2017-05-20 VITALS — BP 143/91 | HR 71

## 2017-05-20 VITALS — BP 134/72 | HR 77 | Temp 98.2°F | Resp 17 | Wt 195.4 lb

## 2017-05-20 DIAGNOSIS — C189 Malignant neoplasm of colon, unspecified: Secondary | ICD-10-CM

## 2017-05-20 DIAGNOSIS — C787 Secondary malignant neoplasm of liver and intrahepatic bile duct: Secondary | ICD-10-CM

## 2017-05-20 DIAGNOSIS — Z79899 Other long term (current) drug therapy: Secondary | ICD-10-CM | POA: Diagnosis not present

## 2017-05-20 DIAGNOSIS — Z5112 Encounter for antineoplastic immunotherapy: Secondary | ICD-10-CM

## 2017-05-20 LAB — CBC WITH DIFFERENTIAL (CANCER CENTER ONLY)
BASO#: 0 10*3/uL (ref 0.0–0.2)
BASO%: 0.4 % (ref 0.0–2.0)
EOS ABS: 0 10*3/uL (ref 0.0–0.5)
EOS%: 0.8 % (ref 0.0–7.0)
HEMATOCRIT: 42.2 % (ref 38.7–49.9)
HGB: 15 g/dL (ref 13.0–17.1)
LYMPH#: 0.8 10*3/uL — ABNORMAL LOW (ref 0.9–3.3)
LYMPH%: 15.3 % (ref 14.0–48.0)
MCH: 36.1 pg — ABNORMAL HIGH (ref 28.0–33.4)
MCHC: 35.5 g/dL (ref 32.0–35.9)
MCV: 102 fL — AB (ref 82–98)
MONO#: 0.6 10*3/uL (ref 0.1–0.9)
MONO%: 11.4 % (ref 0.0–13.0)
NEUT%: 72.1 % (ref 40.0–80.0)
NEUTROS ABS: 3.7 10*3/uL (ref 1.5–6.5)
Platelets: 121 10*3/uL — ABNORMAL LOW (ref 145–400)
RBC: 4.15 10*6/uL — ABNORMAL LOW (ref 4.20–5.70)
RDW: 15.3 % (ref 11.1–15.7)
WBC: 5.2 10*3/uL (ref 4.0–10.0)

## 2017-05-20 LAB — CMP (CANCER CENTER ONLY)
ALK PHOS: 129 U/L — AB (ref 26–84)
ALT(SGPT): 28 U/L (ref 10–47)
AST: 38 U/L (ref 11–38)
Albumin: 3.7 g/dL (ref 3.3–5.5)
BUN, Bld: 10 mg/dL (ref 7–22)
CALCIUM: 9.3 mg/dL (ref 8.0–10.3)
CO2: 27 meq/L (ref 18–33)
Chloride: 104 mEq/L (ref 98–108)
Creat: 1 mg/dl (ref 0.6–1.2)
GLUCOSE: 96 mg/dL (ref 73–118)
POTASSIUM: 3.6 meq/L (ref 3.3–4.7)
Sodium: 142 mEq/L (ref 128–145)
Total Bilirubin: 1.1 mg/dl (ref 0.20–1.60)
Total Protein: 7.3 g/dL (ref 6.4–8.1)

## 2017-05-20 LAB — UA PROTEIN, DIPSTICK - CHCC SATELLITE

## 2017-05-20 MED ORDER — HEPARIN SOD (PORK) LOCK FLUSH 100 UNIT/ML IV SOLN
500.0000 [IU] | Freq: Once | INTRAVENOUS | Status: AC | PRN
  Administered 2017-05-20: 500 [IU]
  Filled 2017-05-20: qty 5

## 2017-05-20 MED ORDER — SODIUM CHLORIDE 0.9% FLUSH
10.0000 mL | INTRAVENOUS | Status: DC | PRN
  Administered 2017-05-20: 10 mL
  Filled 2017-05-20: qty 10

## 2017-05-20 MED ORDER — SODIUM CHLORIDE 0.9 % IV SOLN
14.6000 mg/kg | Freq: Once | INTRAVENOUS | Status: AC
  Administered 2017-05-20: 1300 mg via INTRAVENOUS
  Filled 2017-05-20: qty 48

## 2017-05-20 MED ORDER — SODIUM CHLORIDE 0.9 % IV SOLN
Freq: Once | INTRAVENOUS | Status: AC
  Administered 2017-05-20: 15:00:00 via INTRAVENOUS

## 2017-05-20 NOTE — Patient Instructions (Signed)
[facility] Discharge Instructions for Patients Receiving Chemotherapy  Today you received the following chemotherapy agents Avastin.   To help prevent nausea and vomiting after your treatment, we encourage you to take your nausea medication as prescribed.    If you develop nausea and vomiting that is not controlled by your nausea medication, call the clinic.   BELOW ARE SYMPTOMS THAT SHOULD BE REPORTED IMMEDIATELY:  *FEVER GREATER THAN 100.5 F  *CHILLS WITH OR WITHOUT FEVER  NAUSEA AND VOMITING THAT IS NOT CONTROLLED WITH YOUR NAUSEA MEDICATION  *UNUSUAL SHORTNESS OF BREATH  *UNUSUAL BRUISING OR BLEEDING  TENDERNESS IN MOUTH AND THROAT WITH OR WITHOUT PRESENCE OF ULCERS  *URINARY PROBLEMS  *BOWEL PROBLEMS  UNUSUAL RASH Items with * indicate a potential emergency and should be followed up as soon as possible.  Feel free to call the clinic should you have any questions or concerns. The clinic phone number is [phone].  Please show the CHEMO ALERT CARD at check-in to the Emergency Department and triage nurse.   

## 2017-05-20 NOTE — Progress Notes (Signed)
Hematology and Oncology Follow Up Visit  Ryan Maynard [id number] 24-Jan-1951 66 y.o. 05/20/2017   Principle Diagnosis:  Metastatic colon cancer - K-RAS wild-type - progressive  Current Therapy:   XELOX/Avastin s/p cycle 5 - stopped on 06/30/2015 XELIRI/Erbitux - s/p c#12  (28 day cycles) - omit the Irinotecan;  Status post RFA of liver metastases Clinical Trial at [facility] - [id number] - Perjeta/Herceptin/Exp chemo XELOX/Avastin - s/p cycle #7 -oxaliplatin on hold    Interim History:  Ryan Maynard is here today for follow-up.  Unfortunately, his CEA continues to climb.  His last CEA was 982.  Ryan Maynard does not want to add oxaliplatin back to his protocol.  He is just worried about the side effects that he may have that would affect him in joint Christmas with his wife and family.  He did not enjoy all the snow that we had last week.  He was able to do a lot of shoveling.  He is not eating as much.  He does seem to get a little bit more full earlier.  Has had no problems with diarrhea.  He has had no mouth sores.  He has had no rashes.  There is been no leg swelling.  Overall, his performance status is ECOG 1. . Medications: ( Allergies as of 05/20/2017      Reactions   Bactrim [sulfamethoxazole-trimethoprim] Diarrhea   Has taken 2 different times and both times frequent diarrhea   Ondansetron Hcl Other (See Comments)   Hiccups Hiccups   Pregabalin Other (See Comments)   "thought I was going to die" "thought I was going to die"      Medication List        Accurate as of 05/20/17  2:27 PM. Always use your most recent med list.          amLODipine-benazepril 10-40 MG capsule Commonly known as:  LOTREL Take 1 capsule by mouth daily.   capecitabine 500 MG tablet Commonly known as:  XELODA TAKE 3 TABLETS BY MOUTH TWICE A DAY AFTER A MEAL FOR 14 DAYS ON, THEN 7 DAYS OFF.   Coenzyme Q10 10 MG capsule Take 10 mg by mouth daily.   doxycycline 100 MG tablet Commonly  known as:  VIBRA-TABS Take 1 tablet (100 mg total) by mouth 2 (two) times daily.   gabapentin 100 MG capsule Commonly known as:  NEURONTIN Take 2 capsules (200 mg total) by mouth 3 (three) times daily. Pt okay to take only once a day   ibuprofen 200 MG tablet Commonly known as:  ADVIL,MOTRIN Take 200-400 mg by mouth every 6 (six) hours as needed.   lidocaine-prilocaine cream Commonly known as:  EMLA Apply 1 application topically as needed. Apply quarter sized amount to portacath site 1-2 hours prior to chemotherapy appt.  Cover with saran wrap.   loperamide 2 MG capsule Commonly known as:  IMODIUM Take 1 capsule (2 mg total) by mouth as needed for diarrhea or loose stools.   LORazepam 1 MG tablet Commonly known as:  ATIVAN Take 1 tablet (1 mg total) by mouth every 6 (six) hours as needed (NAUSEA).   oxyCODONE 5 MG immediate release tablet Commonly known as:  Oxy IR/ROXICODONE Take 1 tablet (5 mg total) by mouth every 6 (six) hours as needed for severe pain.   oxyCODONE-acetaminophen 5-325 MG tablet Commonly known as:  ROXICET Take 1-2 tablets by mouth every 8 (eight) hours as needed for severe pain.   oxymetazoline 0.05 % nasal spray Commonly known  as:  AFRIN Place 1 spray into both nostrils 2 (two) times daily as needed for congestion.   traZODone 100 MG tablet Commonly known as:  DESYREL Take 1 tablet (100 mg total) by mouth at bedtime as needed for sleep.       Allergies:  Allergies  Allergen Reactions  . Bactrim [Sulfamethoxazole-Trimethoprim] Diarrhea    Has taken 2 different times and both times frequent diarrhea  . Ondansetron Hcl Other (See Comments)    Hiccups Hiccups  . Pregabalin Other (See Comments)    "thought I was going to die" "thought I was going to die"    Past Medical History, Surgical history, Social history, and Family History were reviewed and updated.  Review of Systems: As stated above in the interim history.  Physical Exam:  weight  is 195 lb 6.4 oz (88.6 kg). His oral temperature is 98.2 F (36.8 C). His blood pressure is 134/72 and his pulse is 77. His respiration is 17 and oxygen saturation is 98%.   Wt Readings from Last 3 Encounters:  05/20/17 195 lb 6.4 oz (88.6 kg)  04/29/17 198 lb (89.8 kg)  04/08/17 200 lb (90.7 kg)    Physical Exam  Constitutional: He is oriented to person, place, and time.  HENT:  Head: Normocephalic and atraumatic.  Mouth/Throat: Oropharynx is clear and moist.  Eyes: EOM are normal. Pupils are equal, round, and reactive to light.  Neck: Normal range of motion.  Cardiovascular: Normal rate, regular rhythm and normal heart sounds.  Pulmonary/Chest: Effort normal and breath sounds normal.  Abdominal: Soft. Bowel sounds are normal.  Musculoskeletal: Normal range of motion. He exhibits no edema, tenderness or deformity.  Lymphadenopathy:    He has no cervical adenopathy.  Neurological: He is alert and oriented to person, place, and time.  Skin: Skin is warm and dry. No rash noted. No erythema.  Psychiatric: He has a normal mood and affect. His behavior is normal. Judgment and thought content normal.  Vitals reviewed.    Lab Results  Component Value Date   WBC 5.2 05/20/2017   HGB 15.0 05/20/2017   HCT 42.2 05/20/2017   MCV 102 (H) 05/20/2017   PLT 121 (L) 05/20/2017   No results found for: FERRITIN, IRON, TIBC, UIBC, IRONPCTSAT Lab Results  Component Value Date   RBC 4.15 (L) 05/20/2017   No results found for: KPAFRELGTCHN, LAMBDASER, KAPLAMBRATIO No results found for: IGGSERUM, IGA, IGMSERUM No results found for: Odetta Pink, SPEI   Chemistry      Component Value Date/Time   NA 142 05/20/2017 1224   NA 140 12/26/2015 1013   K 3.6 05/20/2017 1224   K 4.1 12/26/2015 1013   CL 104 05/20/2017 1224   CO2 27 05/20/2017 1224   CO2 24 12/26/2015 1013   BUN 10 05/20/2017 1224   BUN 16.8 12/26/2015 1013   CREATININE 1.0  05/20/2017 1224   CREATININE 0.8 12/26/2015 1013      Component Value Date/Time   CALCIUM 9.3 05/20/2017 1224   CALCIUM 9.3 12/26/2015 1013   ALKPHOS 129 (H) 05/20/2017 1224   ALKPHOS 109 12/26/2015 1013   AST 38 05/20/2017 1224   AST 28 12/26/2015 1013   ALT 28 05/20/2017 1224   ALT 30 12/26/2015 1013   BILITOT 1.10 05/20/2017 1224   BILITOT 0.77 12/26/2015 1013     Impression and Plan: Mr. Honse is 66 year old gentleman with metastatic colon cancer.   Again, I am relying on  Mr. Merry desires to have a good quality of life and not be "knocked down" by chemotherapy and possibly have a response but yet just exist.  I very much appreciate his decisiveness with respect to what is important for him with his treatments.  He says that he will try the oxaliplatin back wound he starts treatment in January.  I certainly agree with this.  I suspect that his CEA will continue to rise.  However, he still has a good quality of life.  His performance status is still decent.  I will plan to get him back in 3 more weeks.  We will see how things look.    Volanda Napoleon, MD 12/18/20182:27 PM

## 2017-05-21 LAB — CEA (IN HOUSE-CHCC)

## 2017-05-21 LAB — LACTATE DEHYDROGENASE: LDH: 284 U/L — AB (ref 125–245)

## 2017-05-22 MED FILL — XELODA 500 MG TABLET: 500 | 21 days supply | Qty: 84 | Fill #5

## 2017-06-06 ENCOUNTER — Other Ambulatory Visit: Payer: Self-pay | Admitting: Hematology & Oncology

## 2017-06-06 DIAGNOSIS — I10 Essential (primary) hypertension: Secondary | ICD-10-CM

## 2017-06-06 DIAGNOSIS — C787 Secondary malignant neoplasm of liver and intrahepatic bile duct: Principal | ICD-10-CM

## 2017-06-06 DIAGNOSIS — C9110 Chronic lymphocytic leukemia of B-cell type not having achieved remission: Secondary | ICD-10-CM

## 2017-06-06 DIAGNOSIS — R112 Nausea with vomiting, unspecified: Secondary | ICD-10-CM

## 2017-06-06 DIAGNOSIS — C189 Malignant neoplasm of colon, unspecified: Secondary | ICD-10-CM

## 2017-06-09 ENCOUNTER — Other Ambulatory Visit: Payer: Self-pay | Admitting: Family

## 2017-06-10 ENCOUNTER — Inpatient Hospital Stay: Payer: Medicare Other

## 2017-06-10 ENCOUNTER — Other Ambulatory Visit: Payer: Self-pay

## 2017-06-10 ENCOUNTER — Inpatient Hospital Stay: Payer: Medicare Other | Attending: Hematology & Oncology | Admitting: Hematology & Oncology

## 2017-06-10 ENCOUNTER — Telehealth: Payer: Self-pay | Admitting: Hematology & Oncology

## 2017-06-10 ENCOUNTER — Encounter: Payer: Self-pay | Admitting: Hematology & Oncology

## 2017-06-10 VITALS — BP 125/68 | HR 89 | Temp 98.8°F | Resp 18 | Wt 188.0 lb

## 2017-06-10 DIAGNOSIS — Z5112 Encounter for antineoplastic immunotherapy: Secondary | ICD-10-CM | POA: Diagnosis not present

## 2017-06-10 DIAGNOSIS — G893 Neoplasm related pain (acute) (chronic): Secondary | ICD-10-CM | POA: Diagnosis not present

## 2017-06-10 DIAGNOSIS — R5381 Other malaise: Secondary | ICD-10-CM | POA: Insufficient documentation

## 2017-06-10 DIAGNOSIS — M549 Dorsalgia, unspecified: Secondary | ICD-10-CM | POA: Diagnosis not present

## 2017-06-10 DIAGNOSIS — R970 Elevated carcinoembryonic antigen [CEA]: Secondary | ICD-10-CM | POA: Insufficient documentation

## 2017-06-10 DIAGNOSIS — G620 Drug-induced polyneuropathy: Secondary | ICD-10-CM | POA: Insufficient documentation

## 2017-06-10 DIAGNOSIS — T451X5S Adverse effect of antineoplastic and immunosuppressive drugs, sequela: Secondary | ICD-10-CM | POA: Insufficient documentation

## 2017-06-10 DIAGNOSIS — R1011 Right upper quadrant pain: Secondary | ICD-10-CM | POA: Diagnosis not present

## 2017-06-10 DIAGNOSIS — C787 Secondary malignant neoplasm of liver and intrahepatic bile duct: Principal | ICD-10-CM

## 2017-06-10 DIAGNOSIS — R5383 Other fatigue: Secondary | ICD-10-CM | POA: Diagnosis not present

## 2017-06-10 DIAGNOSIS — K5900 Constipation, unspecified: Secondary | ICD-10-CM | POA: Diagnosis not present

## 2017-06-10 DIAGNOSIS — C189 Malignant neoplasm of colon, unspecified: Secondary | ICD-10-CM | POA: Diagnosis not present

## 2017-06-10 DIAGNOSIS — R110 Nausea: Secondary | ICD-10-CM | POA: Diagnosis not present

## 2017-06-10 DIAGNOSIS — R600 Localized edema: Secondary | ICD-10-CM | POA: Diagnosis not present

## 2017-06-10 DIAGNOSIS — Z882 Allergy status to sulfonamides status: Secondary | ICD-10-CM | POA: Insufficient documentation

## 2017-06-10 DIAGNOSIS — R634 Abnormal weight loss: Secondary | ICD-10-CM | POA: Diagnosis not present

## 2017-06-10 DIAGNOSIS — Z79899 Other long term (current) drug therapy: Secondary | ICD-10-CM | POA: Diagnosis not present

## 2017-06-10 LAB — CBC WITH DIFFERENTIAL (CANCER CENTER ONLY)
ABS GRANULOCYTE: 3.4 10*3/uL (ref 1.5–6.5)
BASOS ABS: 0 10*3/uL (ref 0.0–0.1)
Basophils Relative: 0 %
Eosinophils Absolute: 0.1 10*3/uL (ref 0.0–0.5)
Eosinophils Relative: 2 %
HEMATOCRIT: 42.6 % (ref 38.7–49.9)
HEMOGLOBIN: 15.2 g/dL (ref 13.0–17.1)
LYMPHS PCT: 22 %
Lymphs Abs: 1.2 10*3/uL (ref 0.9–3.3)
MCH: 36.2 pg — ABNORMAL HIGH (ref 28.0–33.4)
MCHC: 35.7 g/dL (ref 32.0–35.9)
MCV: 101.4 fL — AB (ref 82.0–98.0)
Monocytes Absolute: 0.6 10*3/uL (ref 0.1–0.9)
Monocytes Relative: 12 %
NEUTROS ABS: 3.4 10*3/uL (ref 1.5–6.5)
Neutrophils Relative %: 64 %
Platelet Count: 122 10*3/uL — ABNORMAL LOW (ref 140–400)
RBC: 4.2 MIL/uL (ref 4.20–5.70)
RDW: 15.1 % (ref 11.1–15.7)
WBC: 5.3 10*3/uL (ref 4.0–10.3)

## 2017-06-10 LAB — CEA (IN HOUSE-CHCC): CEA (CHCC-In House): 1378.77 ng/mL — ABNORMAL HIGH (ref 0.00–5.00)

## 2017-06-10 LAB — CMP (CANCER CENTER ONLY)
ALBUMIN: 3.5 g/dL (ref 3.5–5.0)
ALT: 27 U/L (ref 0–55)
AST: 39 U/L — AB (ref 5–34)
Alkaline Phosphatase: 141 U/L (ref 40–150)
Anion gap: 11 (ref 5–15)
BUN: 8 mg/dL (ref 7–26)
CHLORIDE: 101 mmol/L (ref 98–109)
CO2: 27 mmol/L (ref 22–29)
Calcium: 9.2 mg/dL (ref 8.4–10.4)
Creatinine: 0.9 mg/dL (ref 0.70–1.30)
GFR, Est AFR Am: >60 mL/min (ref >60)
GFR, Estimated: >60 mL/min (ref >60)
Glucose, Bld: 146 mg/dL — ABNORMAL HIGH (ref 70–140)
POTASSIUM: 3.4 mmol/L — AB (ref 3.5–5.1)
Sodium: 139 mmol/L (ref 136–145)
Total Bilirubin: 1.1 mg/dL (ref 0.2–1.2)
Total Protein: 7.2 g/dL (ref 6.4–8.3)

## 2017-06-10 MED ORDER — PALONOSETRON HCL INJECTION 0.25 MG/5ML
0.2500 mg | Freq: Once | INTRAVENOUS | Status: AC
  Administered 2017-06-10: 0.25 mg via INTRAVENOUS

## 2017-06-10 MED ORDER — SODIUM CHLORIDE 0.9 % IV SOLN
Freq: Once | INTRAVENOUS | Status: AC
  Administered 2017-06-10: 10:00:00 via INTRAVENOUS

## 2017-06-10 MED ORDER — SODIUM CHLORIDE 0.9 % IV SOLN
14.7000 mg/kg | Freq: Once | INTRAVENOUS | Status: AC
  Administered 2017-06-10: 1300 mg via INTRAVENOUS
  Filled 2017-06-10: qty 48

## 2017-06-10 MED ORDER — DEXTROSE 5 % IV SOLN
Freq: Once | INTRAVENOUS | Status: AC
  Administered 2017-06-10: 11:00:00 via INTRAVENOUS

## 2017-06-10 MED ORDER — SODIUM CHLORIDE 0.9% FLUSH
10.0000 mL | INTRAVENOUS | Status: DC | PRN
  Administered 2017-06-10: 10 mL
  Filled 2017-06-10: qty 10

## 2017-06-10 MED ORDER — OXALIPLATIN CHEMO INJECTION 100 MG/20ML
98.0000 mg/m2 | Freq: Once | INTRAVENOUS | Status: AC
  Administered 2017-06-10: 200 mg via INTRAVENOUS
  Filled 2017-06-10: qty 40

## 2017-06-10 MED ORDER — DEXAMETHASONE SODIUM PHOSPHATE 10 MG/ML IJ SOLN
10.0000 mg | Freq: Once | INTRAMUSCULAR | Status: AC
  Administered 2017-06-10: 10 mg via INTRAVENOUS

## 2017-06-10 MED ORDER — HEPARIN SOD (PORK) LOCK FLUSH 100 UNIT/ML IV SOLN
500.0000 [IU] | Freq: Once | INTRAVENOUS | Status: AC | PRN
  Administered 2017-06-10: 500 [IU]
  Filled 2017-06-10: qty 5

## 2017-06-10 NOTE — Patient Instructions (Signed)
[facility] Discharge Instructions for Patients Receiving Chemotherapy  Today you received the following chemotherapy agents Oxaliplatin, Avastin To help prevent nausea and vomiting after your treatment, we encourage you to take your nausea medication    If you develop nausea and vomiting that is not controlled by your nausea medication, call the clinic.   BELOW ARE SYMPTOMS THAT SHOULD BE REPORTED IMMEDIATELY:  *FEVER GREATER THAN 100.5 F  *CHILLS WITH OR WITHOUT FEVER  NAUSEA AND VOMITING THAT IS NOT CONTROLLED WITH YOUR NAUSEA MEDICATION  *UNUSUAL SHORTNESS OF BREATH  *UNUSUAL BRUISING OR BLEEDING  TENDERNESS IN MOUTH AND THROAT WITH OR WITHOUT PRESENCE OF ULCERS  *URINARY PROBLEMS  *BOWEL PROBLEMS  UNUSUAL RASH Items with * indicate a potential emergency and should be followed up as soon as possible.  Feel free to call the clinic should you have any questions or concerns. The clinic phone number is [phone].  Please show the [place] at check-in to the Emergency Department and triage nurse.

## 2017-06-10 NOTE — Telephone Encounter (Signed)
[facility] P: [phone]  APPROVED $2800.00 Valid: 03/12/2017 - 06/09/2018 ID: [id number] GRP: [id number] PCN: [id number] RX BIN: [id number] Xeloda  Note:  If funds run out before the end date, please call them for a second grant.  Also, please call them on 05/09/2018 for your renewal for year 2020.   Thanks Baxter Flattery

## 2017-06-11 ENCOUNTER — Other Ambulatory Visit: Payer: Self-pay | Admitting: *Deleted

## 2017-06-11 DIAGNOSIS — C189 Malignant neoplasm of colon, unspecified: Secondary | ICD-10-CM

## 2017-06-11 DIAGNOSIS — I10 Essential (primary) hypertension: Secondary | ICD-10-CM

## 2017-06-11 DIAGNOSIS — C9110 Chronic lymphocytic leukemia of B-cell type not having achieved remission: Secondary | ICD-10-CM

## 2017-06-11 DIAGNOSIS — R112 Nausea with vomiting, unspecified: Secondary | ICD-10-CM

## 2017-06-11 DIAGNOSIS — G4700 Insomnia, unspecified: Secondary | ICD-10-CM

## 2017-06-11 DIAGNOSIS — C787 Secondary malignant neoplasm of liver and intrahepatic bile duct: Principal | ICD-10-CM

## 2017-06-11 MED ORDER — TRAZODONE HCL 100 MG PO TABS: 100.0000 mg | ORAL_TABLET | Freq: Every evening | ORAL | 4 refills | Status: DC | PRN

## 2017-06-11 NOTE — Progress Notes (Signed)
Hematology and Oncology Follow Up Visit  Marlos Carmen [id number] 05-Jul-1950 67 y.o. 06/11/2017   Principle Diagnosis:  Metastatic colon cancer - K-RAS wild-type - progressive  Current Therapy:   XELOX/Avastin s/p cycle 5 - stopped on 06/30/2015 XELIRI/Erbitux - s/p c#12  (28 day cycles) - omit the Irinotecan;  Status post RFA of liver metastases Clinical Trial at [place] - Perjeta/Herceptin/Exp chemo XELOX/Avastin - s/p cycle #8 -oxaliplatin restarted on cycle 9.    Interim History:  Mr. Vitanza is here today for follow-up.  Unfortunately, his CEA continues to climb.  His CEA was up to 1174 we saw him in December.  He is not eating as well.  He feels bloated.  He is having more constipation.  I am sure that this is from his worsening hepatic metastasis.  He has decided to give oxaliplatin to try with the Xeloda and Avastin.  Hopefully, this will make a difference.  He has had no vomiting.  He has had no bleeding.  He has had no leg swelling.  He has had no cough or shortness of breath.  Thankfully, he did have a nice Christmas and New Year's.  Overall, his performance status is ECOG 1. . Medications: ( Allergies as of 06/10/2017      Reactions   Bactrim [sulfamethoxazole-trimethoprim] Diarrhea   Has taken 2 different times and both times frequent diarrhea   Ondansetron Hcl Other (See Comments)   Hiccups Hiccups   Pregabalin Other (See Comments)   "thought I was going to die" "thought I was going to die"      Medication List        Accurate as of 06/10/17 11:59 PM. Always use your most recent med list.          amLODipine-benazepril 10-40 MG capsule Commonly known as:  LOTREL Take 1 capsule by mouth daily.   Coenzyme Q10 10 MG capsule Take 10 mg by mouth daily.   doxycycline 100 MG tablet Commonly known as:  VIBRA-TABS Take 1 tablet (100 mg total) by mouth 2 (two) times daily.   gabapentin 100 MG capsule Commonly known as:  NEURONTIN Take 2  capsules (200 mg total) by mouth 3 (three) times daily. Pt okay to take only once a day   ibuprofen 200 MG tablet Commonly known as:  ADVIL,MOTRIN Take 200-400 mg by mouth every 6 (six) hours as needed.   lidocaine-prilocaine cream Commonly known as:  EMLA Apply 1 application topically as needed. Apply quarter sized amount to portacath site 1-2 hours prior to chemotherapy appt.  Cover with saran wrap.   loperamide 2 MG capsule Commonly known as:  IMODIUM Take 1 capsule (2 mg total) by mouth as needed for diarrhea or loose stools.   LORazepam 1 MG tablet Commonly known as:  ATIVAN Take 1 tablet (1 mg total) by mouth every 6 (six) hours as needed (NAUSEA).   oxyCODONE 5 MG immediate release tablet Commonly known as:  Oxy IR/ROXICODONE Take 1 tablet (5 mg total) by mouth every 6 (six) hours as needed for severe pain.   oxyCODONE-acetaminophen 5-325 MG tablet Commonly known as:  ROXICET Take 1-2 tablets by mouth every 8 (eight) hours as needed for severe pain.   oxymetazoline 0.05 % nasal spray Commonly known as:  AFRIN Place 1 spray into both nostrils 2 (two) times daily as needed for congestion.   traZODone 100 MG tablet Commonly known as:  DESYREL Take 1 tablet (100 mg total) by mouth at bedtime as needed  for sleep.   XELODA 500 MG tablet Generic drug:  capecitabine TAKE 3 TABLETS BY MOUTH TWICE A DAY AFTER A MEAL FOR 14 DAYS ON, THEN 7 DAYS OFF.       Allergies:  Allergies  Allergen Reactions  . Bactrim [Sulfamethoxazole-Trimethoprim] Diarrhea    Has taken 2 different times and both times frequent diarrhea  . Ondansetron Hcl Other (See Comments)    Hiccups Hiccups  . Pregabalin Other (See Comments)    "thought I was going to die" "thought I was going to die"    Past Medical History, Surgical history, Social history, and Family History were reviewed and updated.  Review of Systems: As stated above in the interim history.  Physical Exam:  weight is 188 lb  (85.3 kg). His oral temperature is 98.8 F (37.1 C). His blood pressure is 125/68 and his pulse is 89. His respiration is 18 and oxygen saturation is 98%.   Wt Readings from Last 3 Encounters:  06/10/17 188 lb (85.3 kg)  05/20/17 195 lb 6.4 oz (88.6 kg)  04/29/17 198 lb (89.8 kg)    Physical Exam  Constitutional: He is oriented to person, place, and time.  HENT:  Head: Normocephalic and atraumatic.  Mouth/Throat: Oropharynx is clear and moist.  Eyes: EOM are normal. Pupils are equal, round, and reactive to light.  Neck: Normal range of motion.  Cardiovascular: Normal rate, regular rhythm and normal heart sounds.  Pulmonary/Chest: Effort normal and breath sounds normal.  Abdominal: Soft. Bowel sounds are normal.  Musculoskeletal: Normal range of motion. He exhibits no edema, tenderness or deformity.  Lymphadenopathy:    He has no cervical adenopathy.  Neurological: He is alert and oriented to person, place, and time.  Skin: Skin is warm and dry. No rash noted. No erythema.  Psychiatric: He has a normal mood and affect. His behavior is normal. Judgment and thought content normal.  Vitals reviewed.    Lab Results  Component Value Date   WBC 5.2 05/20/2017   HGB 15.0 05/20/2017   HCT 42.6 06/10/2017   MCV 101.4 (H) 06/10/2017   PLT 121 (L) 05/20/2017   No results found for: FERRITIN, IRON, TIBC, UIBC, IRONPCTSAT Lab Results  Component Value Date   RBC 4.20 06/10/2017   No results found for: KPAFRELGTCHN, LAMBDASER, KAPLAMBRATIO No results found for: Kandis Cocking, IGMSERUM No results found for: Odetta Pink, SPEI   Chemistry      Component Value Date/Time   NA 139 06/10/2017 0755   NA 142 05/20/2017 1224   NA 140 12/26/2015 1013   K 3.4 (L) 06/10/2017 0755   K 3.6 05/20/2017 1224   K 4.1 12/26/2015 1013   CL 101 06/10/2017 0755   CL 104 05/20/2017 1224   CO2 27 06/10/2017 0755   CO2 27 05/20/2017 1224   CO2 24  12/26/2015 1013   BUN 8 06/10/2017 0755   BUN 10 05/20/2017 1224   BUN 16.8 12/26/2015 1013   CREATININE 1.0 05/20/2017 1224   CREATININE 0.8 12/26/2015 1013      Component Value Date/Time   CALCIUM 9.2 06/10/2017 0755   CALCIUM 9.3 05/20/2017 1224   CALCIUM 9.3 12/26/2015 1013   ALKPHOS 141 06/10/2017 0755   ALKPHOS 129 (H) 05/20/2017 1224   ALKPHOS 109 12/26/2015 1013   AST 39 (H) 06/10/2017 0755   AST 28 12/26/2015 1013   ALT 27 06/10/2017 0755   ALT 28 05/20/2017 1224   ALT 30 12/26/2015  1013   BILITOT 1.1 06/10/2017 0755   BILITOT 0.77 12/26/2015 1013     Impression and Plan: Mr. Elvin is 67 year old gentleman with metastatic colon cancer.   Hopefully, adding the oxaliplatin back to his treatment protocol will help.  I know that the CEA has been going up continually.  The CEA is always a good way for [place] to tell as to how well he is responding.  His quality of life is still incredibly important to him.  We will continue to keep that in mind.  I will see him back in 3 more weeks.   Volanda Napoleon, MD 1/9/20194:18 PM

## 2017-06-12 ENCOUNTER — Telehealth: Payer: Self-pay | Admitting: *Deleted

## 2017-06-12 MED FILL — XELODA 500 MG TABLET: 500 | 21 days supply | Qty: 84 | Fill #0

## 2017-06-12 NOTE — Telephone Encounter (Signed)
-----   Message from Volanda Napoleon, MD sent at 06/11/2017  5:49 PM EST ----- Call - the CEA is still tracking higher!!  Now it is 1380!!!  Hopefully the oxaliplatin will help!!  Laurey Arrow

## 2017-07-01 ENCOUNTER — Inpatient Hospital Stay (HOSPITAL_BASED_OUTPATIENT_CLINIC_OR_DEPARTMENT_OTHER): Payer: Medicare Other | Admitting: Hematology & Oncology

## 2017-07-01 ENCOUNTER — Inpatient Hospital Stay: Payer: Medicare Other

## 2017-07-01 ENCOUNTER — Telehealth: Payer: Self-pay | Admitting: Pharmacist

## 2017-07-01 ENCOUNTER — Other Ambulatory Visit: Payer: Self-pay

## 2017-07-01 VITALS — BP 133/69 | HR 75 | Temp 98.4°F | Resp 18

## 2017-07-01 DIAGNOSIS — I10 Essential (primary) hypertension: Secondary | ICD-10-CM

## 2017-07-01 DIAGNOSIS — R1011 Right upper quadrant pain: Secondary | ICD-10-CM | POA: Diagnosis not present

## 2017-07-01 DIAGNOSIS — C787 Secondary malignant neoplasm of liver and intrahepatic bile duct: Secondary | ICD-10-CM | POA: Diagnosis not present

## 2017-07-01 DIAGNOSIS — G893 Neoplasm related pain (acute) (chronic): Secondary | ICD-10-CM | POA: Diagnosis not present

## 2017-07-01 DIAGNOSIS — G4700 Insomnia, unspecified: Secondary | ICD-10-CM

## 2017-07-01 DIAGNOSIS — R112 Nausea with vomiting, unspecified: Secondary | ICD-10-CM

## 2017-07-01 DIAGNOSIS — Z5112 Encounter for antineoplastic immunotherapy: Secondary | ICD-10-CM | POA: Diagnosis not present

## 2017-07-01 DIAGNOSIS — C189 Malignant neoplasm of colon, unspecified: Secondary | ICD-10-CM

## 2017-07-01 DIAGNOSIS — G620 Drug-induced polyneuropathy: Secondary | ICD-10-CM

## 2017-07-01 DIAGNOSIS — M549 Dorsalgia, unspecified: Secondary | ICD-10-CM

## 2017-07-01 DIAGNOSIS — R5383 Other fatigue: Secondary | ICD-10-CM

## 2017-07-01 DIAGNOSIS — Z882 Allergy status to sulfonamides status: Secondary | ICD-10-CM

## 2017-07-01 DIAGNOSIS — C9110 Chronic lymphocytic leukemia of B-cell type not having achieved remission: Secondary | ICD-10-CM

## 2017-07-01 DIAGNOSIS — R970 Elevated carcinoembryonic antigen [CEA]: Secondary | ICD-10-CM

## 2017-07-01 DIAGNOSIS — T451X5S Adverse effect of antineoplastic and immunosuppressive drugs, sequela: Secondary | ICD-10-CM

## 2017-07-01 DIAGNOSIS — R600 Localized edema: Secondary | ICD-10-CM

## 2017-07-01 DIAGNOSIS — Z79899 Other long term (current) drug therapy: Secondary | ICD-10-CM

## 2017-07-01 DIAGNOSIS — R634 Abnormal weight loss: Secondary | ICD-10-CM

## 2017-07-01 DIAGNOSIS — R110 Nausea: Secondary | ICD-10-CM

## 2017-07-01 DIAGNOSIS — R5381 Other malaise: Secondary | ICD-10-CM

## 2017-07-01 DIAGNOSIS — K5900 Constipation, unspecified: Secondary | ICD-10-CM

## 2017-07-01 LAB — CMP (CANCER CENTER ONLY)
ALBUMIN: 3.4 g/dL — AB (ref 3.5–5.0)
ALK PHOS: 173 U/L — AB (ref 26–84)
ALT: 31 U/L (ref 0–55)
AST: 48 U/L — AB (ref 5–34)
Anion gap: 4 — ABNORMAL LOW (ref 5–15)
BUN: 13 mg/dL (ref 7–22)
CO2: 28 mmol/L (ref 18–33)
CREATININE: 1.1 mg/dL (ref 0.70–1.30)
Calcium: 8.7 mg/dL (ref 8.0–10.3)
Chloride: 107 mmol/L (ref 98–108)
GLUCOSE: 146 mg/dL — AB (ref 70–118)
Potassium: 3.4 mmol/L (ref 3.3–4.7)
SODIUM: 139 mmol/L (ref 128–145)
TOTAL PROTEIN: 7.3 g/dL (ref 6.4–8.1)
Total Bilirubin: 1 mg/dL (ref 0.2–1.2)

## 2017-07-01 LAB — CEA (IN HOUSE-CHCC): CEA (CHCC-In House): 1473.99 ng/mL — ABNORMAL HIGH (ref 0.00–5.00)

## 2017-07-01 LAB — LACTATE DEHYDROGENASE: LDH: 260 U/L — AB (ref 125–245)

## 2017-07-01 LAB — CBC WITH DIFFERENTIAL (CANCER CENTER ONLY)
BASOS ABS: 0 10*3/uL (ref 0.0–0.1)
Basophils Relative: 1 %
EOS ABS: 0.1 10*3/uL (ref 0.0–0.5)
EOS PCT: 2 %
HCT: 39.5 % (ref 38.7–49.9)
HEMOGLOBIN: 14.3 g/dL (ref 13.0–17.1)
LYMPHS ABS: 1.1 10*3/uL (ref 0.9–3.3)
Lymphocytes Relative: 24 %
MCH: 36 pg — AB (ref 28.0–33.4)
MCHC: 36.2 g/dL — ABNORMAL HIGH (ref 32.0–35.9)
MCV: 99.5 fL — ABNORMAL HIGH (ref 82.0–98.0)
Monocytes Absolute: 0.7 10*3/uL (ref 0.1–0.9)
Monocytes Relative: 16 %
Neutro Abs: 2.6 10*3/uL (ref 1.5–6.5)
Neutrophils Relative %: 57 %
PLATELETS: 120 10*3/uL — AB (ref 140–400)
RBC: 3.97 MIL/uL — AB (ref 4.20–5.70)
RDW: 15.2 % (ref 11.1–15.7)
WBC: 4.5 10*3/uL (ref 4.0–10.3)

## 2017-07-01 MED ORDER — SODIUM CHLORIDE 0.9 % IV SOLN
Freq: Once | INTRAVENOUS | Status: AC
Start: 2017-07-01 — End: 2017-07-01
  Administered 2017-07-01: 09:00:00 via INTRAVENOUS

## 2017-07-01 MED ORDER — LACTULOSE 20 GM/30ML PO SOLN: 15.0000 mL | Freq: Four times a day (QID) | ORAL | 2 refills | Status: AC | PRN

## 2017-07-01 MED ORDER — SODIUM CHLORIDE 0.9% FLUSH
10.0000 mL | INTRAVENOUS | Status: DC | PRN
  Administered 2017-07-01: 10 mL
  Filled 2017-07-01: qty 10

## 2017-07-01 MED ORDER — TRAZODONE HCL 100 MG PO TABS: 100.0000 mg | ORAL_TABLET | Freq: Every evening | ORAL | 4 refills | Status: AC | PRN

## 2017-07-01 MED ORDER — HEPARIN SOD (PORK) LOCK FLUSH 100 UNIT/ML IV SOLN
500.0000 [IU] | Freq: Once | INTRAVENOUS | Status: AC | PRN
  Administered 2017-07-01: 500 [IU]
  Filled 2017-07-01: qty 5

## 2017-07-01 MED ORDER — OXYCODONE HCL 5 MG PO TABS: 5.0000 mg | ORAL_TABLET | Freq: Four times a day (QID) | ORAL | 0 refills | Status: DC | PRN

## 2017-07-01 MED ORDER — SODIUM CHLORIDE 0.9 % IV SOLN
1300.0000 mg | Freq: Once | INTRAVENOUS | Status: AC
  Administered 2017-07-01: 1300 mg via INTRAVENOUS
  Filled 2017-07-01: qty 48

## 2017-07-01 NOTE — Progress Notes (Signed)
Hematology and Oncology Follow Up Visit  Ryan Maynard [id number] 1950/12/26 67 y.o. 07/01/2017   Principle Diagnosis:  Metastatic colon cancer - K-RAS wild-type - progressive  Current Therapy:   XELOX/Avastin s/p cycle 5 - stopped on 06/30/2015 XELIRI/Erbitux - s/p c#12  (28 day cycles) - omit the Irinotecan;  Status post RFA of liver metastases Clinical Trial at [facility] - Perjeta/Herceptin/Exp chemo XELOX/Avastin - s/p cycle #6 -oxaliplatin restarted on cycle 8.    Interim History:  Ryan Maynard is here today for follow-up.  He is having more problems with pain.  This pain is in the right upper quadrant of his abdomen.  I am sure that this is from hepatic metastasis.  We did go ahead and restart the oxaliplatin with his last cycle of treatment.  He seemed to do okay.  He is having some neuropathy.  Outside of that, he seemed to tolerate it fairly well with the Xeloda.  He did not want oxaliplatin today.  He is more constipated.  I gave him some lactulose to see if this would help.  The seem to help last time.  He did not need a lot of lactulose.  We talked about Movantik.  He wants to hold off on this until he sees how lactulose helps.  His last CEA was 1380.  Prior to this, his CEA was 1170.  His appetite is down because he just is not go to the bathroom all that well.  He is having a little bit of leg swelling.  He has had no fever.  He has had no headache.  He has had no mouth sores.  Overall, his performance status is ECOG 1. . Medications: ( Allergies as of 07/01/2017      Reactions   Bactrim [sulfamethoxazole-trimethoprim] Diarrhea   Has taken 2 different times and both times frequent diarrhea   Ondansetron Hcl Other (See Comments)   Hiccups Hiccups   Pregabalin Other (See Comments)   "thought I was going to die" "thought I was going to die"      Medication List        Accurate as of 07/01/17  8:22 AM. Always use your most recent med list.            amLODipine-benazepril 10-40 MG capsule Commonly known as:  LOTREL Take 1 capsule by mouth daily.   Coenzyme Q10 10 MG capsule Take 10 mg by mouth daily.   doxycycline 100 MG tablet Commonly known as:  VIBRA-TABS Take 1 tablet (100 mg total) by mouth 2 (two) times daily.   gabapentin 100 MG capsule Commonly known as:  NEURONTIN Take 2 capsules (200 mg total) by mouth 3 (three) times daily. Pt okay to take only once a day   ibuprofen 200 MG tablet Commonly known as:  ADVIL,MOTRIN Take 200-400 mg by mouth every 6 (six) hours as needed.   lidocaine-prilocaine cream Commonly known as:  EMLA Apply 1 application topically as needed. Apply quarter sized amount to portacath site 1-2 hours prior to chemotherapy appt.  Cover with saran wrap.   loperamide 2 MG capsule Commonly known as:  IMODIUM Take 1 capsule (2 mg total) by mouth as needed for diarrhea or loose stools.   LORazepam 1 MG tablet Commonly known as:  ATIVAN Take 1 tablet (1 mg total) by mouth every 6 (six) hours as needed (NAUSEA).   oxyCODONE 5 MG immediate release tablet Commonly known as:  Oxy IR/ROXICODONE Take 1 tablet (5 mg total) by mouth  every 6 (six) hours as needed for severe pain.   oxymetazoline 0.05 % nasal spray Commonly known as:  AFRIN Place 1 spray into both nostrils 2 (two) times daily as needed for congestion.   polyethylene glycol packet Commonly known as:  MIRALAX / GLYCOLAX Take 17 g by mouth daily as needed.   traZODone 100 MG tablet Commonly known as:  DESYREL Take 1 tablet (100 mg total) by mouth at bedtime as needed for sleep.   XELODA 500 MG tablet Generic drug:  capecitabine TAKE 3 TABLETS BY MOUTH TWICE A DAY AFTER A MEAL FOR 14 DAYS ON, THEN 7 DAYS OFF.       Allergies:  Allergies  Allergen Reactions  . Bactrim [Sulfamethoxazole-Trimethoprim] Diarrhea    Has taken 2 different times and both times frequent diarrhea  . Ondansetron Hcl Other (See Comments)     Hiccups Hiccups  . Pregabalin Other (See Comments)    "thought I was going to die" "thought I was going to die"    Past Medical History, Surgical history, Social history, and Family History were reviewed and updated.  Review of Systems: .Review of Systems  Constitutional: Positive for malaise/fatigue and weight loss.  HENT: Negative.   Eyes: Negative.   Respiratory: Negative.   Cardiovascular: Negative.   Gastrointestinal: Positive for constipation and nausea.  Genitourinary: Negative.   Musculoskeletal: Positive for back pain.  Skin: Negative.   Neurological: Negative.   Endo/Heme/Allergies: Negative.   Psychiatric/Behavioral: Negative.     Physical Exam:  weight is 185 lb 8 oz (84.1 kg). His oral temperature is 98.1 F (36.7 C). His blood pressure is 129/69 and his pulse is 91. His respiration is 20 and oxygen saturation is 98%.   Wt Readings from Last 3 Encounters:  07/01/17 185 lb 8 oz (84.1 kg)  06/10/17 188 lb (85.3 kg)  05/20/17 195 lb 6.4 oz (88.6 kg)    Physical Exam  Constitutional: He is oriented to person, place, and time.  HENT:  Head: Normocephalic and atraumatic.  Mouth/Throat: Oropharynx is clear and moist.  Eyes: EOM are normal. Pupils are equal, round, and reactive to light.  Neck: Normal range of motion.  Cardiovascular: Normal rate, regular rhythm and normal heart sounds.  Pulmonary/Chest: Effort normal and breath sounds normal.  Abdominal: Soft. Bowel sounds are normal.  Musculoskeletal: Normal range of motion. He exhibits no edema, tenderness or deformity.  Lymphadenopathy:    He has no cervical adenopathy.  Neurological: He is alert and oriented to person, place, and time.  Skin: Skin is warm and dry. No rash noted. No erythema.  Psychiatric: He has a normal mood and affect. His behavior is normal. Judgment and thought content normal.  Vitals reviewed.    Lab Results  Component Value Date   WBC 4.5 07/01/2017   HGB 15.0 05/20/2017    HCT 39.5 07/01/2017   MCV 99.5 (H) 07/01/2017   PLT 120 (L) 07/01/2017   No results found for: FERRITIN, IRON, TIBC, UIBC, IRONPCTSAT Lab Results  Component Value Date   RBC 3.97 (L) 07/01/2017   No results found for: KPAFRELGTCHN, LAMBDASER, KAPLAMBRATIO No results found for: IGGSERUM, IGA, IGMSERUM No results found for: Ronnald Ramp, A1GS, A2GS, Violet Baldy MSPIKE, SPEI   Chemistry      Component Value Date/Time   NA 139 07/01/2017 0743   NA 142 05/20/2017 1224   NA 140 12/26/2015 1013   K 3.4 07/01/2017 0743   K 3.6 05/20/2017 1224   K 4.1  12/26/2015 1013   CL 107 07/01/2017 0743   CL 104 05/20/2017 1224   CO2 28 07/01/2017 0743   CO2 27 05/20/2017 1224   CO2 24 12/26/2015 1013   BUN 13 07/01/2017 0743   BUN 10 05/20/2017 1224   BUN 16.8 12/26/2015 1013   CREATININE 1.0 05/20/2017 1224   CREATININE 0.8 12/26/2015 1013      Component Value Date/Time   CALCIUM 8.7 07/01/2017 0743   CALCIUM 9.3 05/20/2017 1224   CALCIUM 9.3 12/26/2015 1013   ALKPHOS 173 (H) 07/01/2017 0743   ALKPHOS 129 (H) 05/20/2017 1224   ALKPHOS 109 12/26/2015 1013   AST 48 (H) 07/01/2017 0743   AST 28 12/26/2015 1013   ALT 31 07/01/2017 0743   ALT 28 05/20/2017 1224   ALT 30 12/26/2015 1013   BILITOT 1.0 07/01/2017 0743   BILITOT 0.77 12/26/2015 1013     Impression and Plan: Ryan Maynard is 67 year old gentleman with metastatic colon cancer.   We did get his CEA level back today.  It was up to 1470.  As such, this is not as much of a rise.  I spoke with Ryan Maynard about this.  He wants to restart the oxaliplatin with his next cycle of treatment.  We spent 35 minutes with he and his wife.  Greater than 50% of this time was face-to-face.  We talked about his goals of treatment.  His goal of treatment has only been quality of life.  He has wanted a good quality of life.  If his quality of life is suffering, he will stop treatment.  I totally agree with this.  We will get him  back in 3 more weeks.     Volanda Napoleon, MD 1/29/20198:22 AM

## 2017-07-01 NOTE — Telephone Encounter (Signed)
Oral Chemotherapy Pharmacist Encounter   Attempted to reach patient for follow up on oral medication: Xeloda. No answer. Left VM for patient to call back.    Darl Pikes, PharmD, BCPS Hematology/Oncology Clinical Pharmacist [facility] [phone]  07/01/2017 2:15 PM

## 2017-07-01 NOTE — Patient Instructions (Signed)
Bevacizumab injection What is this medicine? BEVACIZUMAB (be va SIZ yoo mab) is a monoclonal antibody. It is used to treat many types of cancer. This medicine may be used for other purposes; ask your health care provider or pharmacist if you have questions. COMMON BRAND NAME(S): Avastin What should I tell my health care provider before I take this medicine? They need to know if you have any of these conditions: -diabetes -heart disease -high blood pressure -history of coughing up blood -prior anthracycline chemotherapy (e.g., doxorubicin, daunorubicin, epirubicin) -recent or ongoing radiation therapy -recent or planning to have surgery -stroke -an unusual or allergic reaction to bevacizumab, hamster proteins, mouse proteins, other medicines, foods, dyes, or preservatives -pregnant or trying to get pregnant -breast-feeding How should I use this medicine? This medicine is for infusion into a vein. It is given by a health care professional in a hospital or clinic setting. Talk to your pediatrician regarding the use of this medicine in children. Special care may be needed. Overdosage: If you think you have taken too much of this medicine contact a poison control center or emergency room at once. NOTE: This medicine is only for you. Do not share this medicine with others. What if I miss a dose? It is important not to miss your dose. Call your doctor or health care professional if you are unable to keep an appointment. What may interact with this medicine? Interactions are not expected. This list may not describe all possible interactions. Give your health care provider a list of all the medicines, herbs, non-prescription drugs, or dietary supplements you use. Also tell them if you smoke, drink alcohol, or use illegal drugs. Some items may interact with your medicine. What should I watch for while using this medicine? Your condition will be monitored carefully while you are receiving this  medicine. You will need important blood work and urine testing done while you are taking this medicine. This medicine may increase your risk to bruise or bleed. Call your doctor or health care professional if you notice any unusual bleeding. This medicine should be started at least 28 days following major surgery and the site of the surgery should be totally healed. Check with your doctor before scheduling dental work or surgery while you are receiving this treatment. Talk to your doctor if you have recently had surgery or if you have a wound that has not healed. Do not become pregnant while taking this medicine or for 6 months after stopping it. Women should inform their doctor if they wish to become pregnant or think they might be pregnant. There is a potential for serious side effects to an unborn child. Talk to your health care professional or pharmacist for more information. Do not breast-feed an infant while taking this medicine and for 6 months after the last dose. This medicine has caused ovarian failure in some women. This medicine may interfere with the ability to have a child. You should talk to your doctor or health care professional if you are concerned about your fertility. What side effects may I notice from receiving this medicine? Side effects that you should report to your doctor or health care professional as soon as possible: -allergic reactions like skin rash, itching or hives, swelling of the face, lips, or tongue -chest pain or chest tightness -chills -coughing up blood -high fever -seizures -severe constipation -signs and symptoms of bleeding such as bloody or black, tarry stools; red or dark-brown urine; spitting up blood or brown material that looks   like coffee grounds; red spots on the skin; unusual bruising or bleeding from the eye, gums, or nose -signs and symptoms of a blood clot such as breathing problems; chest pain; severe, sudden headache; pain, swelling, warmth in  the leg -signs and symptoms of a stroke like changes in vision; confusion; trouble speaking or understanding; severe headaches; sudden numbness or weakness of the face, arm or leg; trouble walking; dizziness; loss of balance or coordination -stomach pain -sweating -swelling of legs or ankles -vomiting -weight gain Side effects that usually do not require medical attention (report to your doctor or health care professional if they continue or are bothersome): -back pain -changes in taste -decreased appetite -dry skin -nausea -tiredness This list may not describe all possible side effects. Call your doctor for medical advice about side effects. You may report side effects to FDA at 1-800-FDA-1088. Where should I keep my medicine? This drug is given in a hospital or clinic and will not be stored at home. NOTE: This sheet is a summary. It may not cover all possible information. If you have questions about this medicine, talk to your doctor, pharmacist, or health care provider.  2018 Elsevier/Gold Standard (2016-05-17 14:33:29)  

## 2017-07-03 NOTE — Telephone Encounter (Signed)
Oral Chemotherapy Pharmacist Encounter   Attempted to reach patient for follow up on oral medication: Xeloda. No answer. Left VM for patient to call back.    Darl Pikes, PharmD, BCPS Hematology/Oncology Clinical Pharmacist [facility] [phone]  07/03/2017 1:54 PM

## 2017-07-07 NOTE — Telephone Encounter (Signed)
Oral Chemotherapy Pharmacist Encounter   Attempted to reach patient for follow up on oral medication: Xeloda. No answer. Left VM for patient to call back.    Darl Pikes, PharmD, BCPS Hematology/Oncology Clinical Pharmacist [facility] [phone]  07/07/2017 3:31 PM

## 2017-07-09 NOTE — Telephone Encounter (Signed)
Oral Chemotherapy Pharmacist Encounter  Follow-Up Form  Called patient today to follow up regarding patient's oral chemotherapy medication: Xeloda  Original Start date of oral chemotherapy: 12/24/16  Pt's wife reports occasional missed doses of Xeloda. She was not able to tell me exactly how many dose have been missed or over what time period. She stated that he misses doses when he is constipated and doesn't feel like taking his medication because it makes it worse. They were given lactulose to help with the constipation. I asked pt's wife to keep track of how many doses he miss. Reviewed plan for missed doses.   Pt reports the following side effects: constipation (see above)  Recent labs reviewed: CEA from 07/01/17  New medications?: lactulose, no DDI with Xeloda  Other Issues: N/A  Patient knows to call the office with questions or concerns. [facility] will continue to follow.  Darl Pikes, PharmD, BCPS Hematology/Oncology Clinical Pharmacist [facility] [phone]  07/09/2017 1:30 PM

## 2017-07-10 MED FILL — XELODA 500 MG TABLET: 500 | 21 days supply | Qty: 84 | Fill #1

## 2017-07-22 ENCOUNTER — Encounter: Payer: Self-pay | Admitting: Hematology & Oncology

## 2017-07-22 ENCOUNTER — Inpatient Hospital Stay: Payer: Medicare Other | Attending: Hematology & Oncology | Admitting: Hematology & Oncology

## 2017-07-22 ENCOUNTER — Inpatient Hospital Stay: Payer: Medicare Other

## 2017-07-22 ENCOUNTER — Other Ambulatory Visit: Payer: Self-pay

## 2017-07-22 ENCOUNTER — Other Ambulatory Visit: Payer: Self-pay | Admitting: *Deleted

## 2017-07-22 VITALS — BP 126/65 | HR 85 | Temp 98.2°F | Resp 16 | Wt 182.0 lb

## 2017-07-22 DIAGNOSIS — Z79899 Other long term (current) drug therapy: Secondary | ICD-10-CM | POA: Diagnosis not present

## 2017-07-22 DIAGNOSIS — C787 Secondary malignant neoplasm of liver and intrahepatic bile duct: Principal | ICD-10-CM

## 2017-07-22 DIAGNOSIS — C189 Malignant neoplasm of colon, unspecified: Secondary | ICD-10-CM | POA: Insufficient documentation

## 2017-07-22 DIAGNOSIS — Z882 Allergy status to sulfonamides status: Secondary | ICD-10-CM | POA: Insufficient documentation

## 2017-07-22 DIAGNOSIS — R5381 Other malaise: Secondary | ICD-10-CM | POA: Diagnosis not present

## 2017-07-22 DIAGNOSIS — R5383 Other fatigue: Secondary | ICD-10-CM | POA: Diagnosis not present

## 2017-07-22 DIAGNOSIS — M549 Dorsalgia, unspecified: Secondary | ICD-10-CM

## 2017-07-22 DIAGNOSIS — Z5111 Encounter for antineoplastic chemotherapy: Secondary | ICD-10-CM | POA: Insufficient documentation

## 2017-07-22 DIAGNOSIS — K5900 Constipation, unspecified: Secondary | ICD-10-CM | POA: Diagnosis not present

## 2017-07-22 DIAGNOSIS — Z5112 Encounter for antineoplastic immunotherapy: Secondary | ICD-10-CM | POA: Diagnosis present

## 2017-07-22 DIAGNOSIS — R110 Nausea: Secondary | ICD-10-CM | POA: Diagnosis not present

## 2017-07-22 DIAGNOSIS — R634 Abnormal weight loss: Secondary | ICD-10-CM | POA: Diagnosis not present

## 2017-07-22 DIAGNOSIS — L509 Urticaria, unspecified: Secondary | ICD-10-CM | POA: Diagnosis not present

## 2017-07-22 LAB — CBC WITH DIFFERENTIAL (CANCER CENTER ONLY)
BASOS PCT: 0 %
Basophils Absolute: 0 10*3/uL (ref 0.0–0.1)
Eosinophils Absolute: 0.1 10*3/uL (ref 0.0–0.5)
Eosinophils Relative: 1 %
HEMATOCRIT: 44.4 % (ref 38.7–49.9)
Hemoglobin: 15.3 g/dL (ref 13.0–17.1)
LYMPHS PCT: 20 %
Lymphs Abs: 0.9 10*3/uL (ref 0.9–3.3)
MCH: 34.5 pg — ABNORMAL HIGH (ref 28.0–33.4)
MCHC: 34.5 g/dL (ref 32.0–35.9)
MCV: 100 fL — ABNORMAL HIGH (ref 82.0–98.0)
Monocytes Absolute: 0.5 10*3/uL (ref 0.1–0.9)
Monocytes Relative: 12 %
NEUTROS PCT: 67 %
Neutro Abs: 2.9 10*3/uL (ref 1.5–6.5)
PLATELETS: 115 10*3/uL — AB (ref 145–400)
RBC: 4.44 MIL/uL (ref 4.20–5.70)
RDW: 14.5 % (ref 11.1–15.7)
WBC Count: 4.3 10*3/uL (ref 4.0–10.0)

## 2017-07-22 LAB — CMP (CANCER CENTER ONLY)
ALK PHOS: 224 U/L — AB (ref 26–84)
ALT: 22 U/L (ref 10–47)
ANION GAP: 9 (ref 5–15)
AST: 48 U/L — ABNORMAL HIGH (ref 11–38)
Albumin: 3.4 g/dL — ABNORMAL LOW (ref 3.5–5.0)
BILIRUBIN TOTAL: 1 mg/dL (ref 0.2–1.6)
BUN: 11 mg/dL (ref 7–22)
CO2: 29 mmol/L (ref 18–33)
Calcium: 9.2 mg/dL (ref 8.0–10.3)
Chloride: 101 mmol/L (ref 98–108)
Creatinine: 0.9 mg/dL (ref 0.60–1.20)
Glucose, Bld: 139 mg/dL — ABNORMAL HIGH (ref 73–118)
POTASSIUM: 3.5 mmol/L (ref 3.3–4.7)
Sodium: 139 mmol/L (ref 128–145)
TOTAL PROTEIN: 8 g/dL (ref 6.4–8.1)

## 2017-07-22 LAB — LACTATE DEHYDROGENASE: LDH: 283 U/L — AB (ref 125–245)

## 2017-07-22 LAB — CEA (IN HOUSE-CHCC): CEA (CHCC-In House): 1744.84 ng/mL — ABNORMAL HIGH (ref 0.00–5.00)

## 2017-07-22 LAB — UA PROTEIN, DIPSTICK - CHCC: Protein, ur: <30 mg/dL

## 2017-07-22 MED ORDER — DEXTROSE 5 % IV SOLN
Freq: Once | INTRAVENOUS | Status: AC
  Administered 2017-07-22: 13:00:00 via INTRAVENOUS

## 2017-07-22 MED ORDER — PALONOSETRON HCL INJECTION 0.25 MG/5ML
0.2500 mg | Freq: Once | INTRAVENOUS | Status: AC
  Administered 2017-07-22: 0.25 mg via INTRAVENOUS

## 2017-07-22 MED ORDER — DEXAMETHASONE SODIUM PHOSPHATE 10 MG/ML IJ SOLN
10.0000 mg | Freq: Once | INTRAMUSCULAR | Status: AC
  Administered 2017-07-22: 10 mg via INTRAVENOUS

## 2017-07-22 MED ORDER — SODIUM CHLORIDE 0.9% FLUSH
10.0000 mL | INTRAVENOUS | Status: DC | PRN
  Filled 2017-07-22: qty 10

## 2017-07-22 MED ORDER — DEXAMETHASONE SODIUM PHOSPHATE 10 MG/ML IJ SOLN
INTRAMUSCULAR | Status: AC
  Filled 2017-07-22: qty 1

## 2017-07-22 MED ORDER — SODIUM CHLORIDE 0.9% FLUSH
10.0000 mL | INTRAVENOUS | Status: DC | PRN
  Administered 2017-07-22: 10 mL
  Filled 2017-07-22: qty 10

## 2017-07-22 MED ORDER — PALONOSETRON HCL INJECTION 0.25 MG/5ML
INTRAVENOUS | Status: AC
  Filled 2017-07-22: qty 5

## 2017-07-22 MED ORDER — HEPARIN SOD (PORK) LOCK FLUSH 100 UNIT/ML IV SOLN
500.0000 [IU] | Freq: Once | INTRAVENOUS | Status: DC | PRN
  Filled 2017-07-22: qty 5

## 2017-07-22 MED ORDER — HEPARIN SOD (PORK) LOCK FLUSH 100 UNIT/ML IV SOLN
500.0000 [IU] | Freq: Once | INTRAVENOUS | Status: AC | PRN
  Administered 2017-07-22: 500 [IU]
  Filled 2017-07-22: qty 5

## 2017-07-22 MED ORDER — BEVACIZUMAB CHEMO INJECTION 400 MG/16ML
1300.0000 mg | Freq: Once | INTRAVENOUS | Status: AC
  Administered 2017-07-22: 1300 mg via INTRAVENOUS
  Filled 2017-07-22: qty 48

## 2017-07-22 MED ORDER — OXALIPLATIN CHEMO INJECTION 100 MG/20ML
98.0000 mg/m2 | Freq: Once | INTRAVENOUS | Status: AC
  Administered 2017-07-22: 200 mg via INTRAVENOUS
  Filled 2017-07-22: qty 40

## 2017-07-22 MED ORDER — SODIUM CHLORIDE 0.9 % IV SOLN
Freq: Once | INTRAVENOUS | Status: AC
  Administered 2017-07-22: 11:00:00 via INTRAVENOUS

## 2017-07-22 NOTE — Progress Notes (Signed)
Hematology and Oncology Follow Up Visit  Korde Jeppsen [id number] 1950-11-02 67 y.o. 07/22/2017   Principle Diagnosis:  Metastatic colon cancer - K-RAS wild-type - progressive  Current Therapy:   XELOX/Avastin s/p cycle 5 - stopped on 06/30/2015 XELIRI/Erbitux - s/p c#12  (28 day cycles) - omit the Irinotecan;  Status post RFA of liver metastases Clinical Trial at [place] - Perjeta/Herceptin/Exp chemo XELOX/Avastin - s/p cycle #7 -oxaliplatin restarted on cycle 8.    Interim History:  Mr. Deason is here today for follow-up.  I just have a feeling that his cancer is beginning to slow him down.  His last CEA was 1437.  This was not up by as much.  As such, he wants to give oxaliplatin a try.  It sounds like he is sleeping more.  Though unusual thing that has happened is that he gets this rash.  He said it seems to happen in the morning.  It is a urticarial type rash.  There are no blisters.  He says it happens in his groin area.  It can happen on his arms and in the axilla.  He says he takes Benadryl for this and it helps.  He has had no change in medications.  We have not given him anything new.  I am not sure exactly why this is happening.  It would be unusual for it to be from the Xeloda.  I cannot figure out how this would be related to his liver being involved with malignancy.  His appetite is not as good.  His weight is still slowly trending down.  He still is totally in control of his activity level.  He has always prioritize his quality of life.  Currently, his performance status is ECOG 1.    . Medications: ( Allergies as of 07/22/2017      Reactions   Bactrim [sulfamethoxazole-trimethoprim] Diarrhea   Has taken 2 different times and both times frequent diarrhea   Ondansetron Hcl Other (See Comments)   Hiccups Hiccups   Pregabalin Other (See Comments)   "thought I was going to die" "thought I was going to die"      Medication List        Accurate as of  07/22/17  9:48 AM. Always use your most recent med list.          amLODipine-benazepril 10-40 MG capsule Commonly known as:  LOTREL Take 1 capsule by mouth daily.   Coenzyme Q10 10 MG capsule Take 10 mg by mouth daily.   doxycycline 100 MG tablet Commonly known as:  VIBRA-TABS Take 1 tablet (100 mg total) by mouth 2 (two) times daily.   gabapentin 100 MG capsule Commonly known as:  NEURONTIN Take 2 capsules (200 mg total) by mouth 3 (three) times daily. Pt okay to take only once a day   ibuprofen 200 MG tablet Commonly known as:  ADVIL,MOTRIN Take 200-400 mg by mouth every 6 (six) hours as needed.   Lactulose 20 GM/30ML Soln Take 15 mLs (10 g total) by mouth every 6 (six) hours as needed.   lidocaine-prilocaine cream Commonly known as:  EMLA Apply 1 application topically as needed. Apply quarter sized amount to portacath site 1-2 hours prior to chemotherapy appt.  Cover with saran wrap.   loperamide 2 MG capsule Commonly known as:  IMODIUM Take 1 capsule (2 mg total) by mouth as needed for diarrhea or loose stools.   LORazepam 1 MG tablet Commonly known as:  ATIVAN Take 1  tablet (1 mg total) by mouth every 6 (six) hours as needed (NAUSEA).   oxyCODONE 5 MG immediate release tablet Commonly known as:  Oxy IR/ROXICODONE Take 1 tablet (5 mg total) by mouth every 6 (six) hours as needed for severe pain.   oxymetazoline 0.05 % nasal spray Commonly known as:  AFRIN Place 1 spray into both nostrils 2 (two) times daily as needed for congestion.   polyethylene glycol packet Commonly known as:  MIRALAX / GLYCOLAX Take 17 g by mouth daily as needed.   traZODone 100 MG tablet Commonly known as:  DESYREL Take 1 tablet (100 mg total) by mouth at bedtime as needed for sleep.   XELODA 500 MG tablet Generic drug:  capecitabine TAKE 3 TABLETS BY MOUTH TWICE A DAY AFTER A MEAL FOR 14 DAYS ON, THEN 7 DAYS OFF.       Allergies:  Allergies  Allergen Reactions  . Bactrim  [Sulfamethoxazole-Trimethoprim] Diarrhea    Has taken 2 different times and both times frequent diarrhea  . Ondansetron Hcl Other (See Comments)    Hiccups Hiccups  . Pregabalin Other (See Comments)    "thought I was going to die" "thought I was going to die"    Past Medical History, Surgical history, Social history, and Family History were reviewed and updated.  Review of Systems: .Review of Systems  Constitutional: Positive for malaise/fatigue and weight loss.  HENT: Negative.   Eyes: Negative.   Respiratory: Negative.   Cardiovascular: Negative.   Gastrointestinal: Positive for constipation and nausea.  Genitourinary: Negative.   Musculoskeletal: Positive for back pain.  Skin: Negative.   Neurological: Negative.   Endo/Heme/Allergies: Negative.   Psychiatric/Behavioral: Negative.     Physical Exam:  weight is 182 lb (82.6 kg). His oral temperature is 98.2 F (36.8 C). His blood pressure is 126/65 and his pulse is 85. His respiration is 16 and oxygen saturation is 97%.   Wt Readings from Last 3 Encounters:  07/22/17 182 lb (82.6 kg)  07/01/17 185 lb 8 oz (84.1 kg)  06/10/17 188 lb (85.3 kg)    Physical Exam  Constitutional: He is oriented to person, place, and time.  HENT:  Head: Normocephalic and atraumatic.  Mouth/Throat: Oropharynx is clear and moist.  Eyes: EOM are normal. Pupils are equal, round, and reactive to light.  Neck: Normal range of motion.  Cardiovascular: Normal rate, regular rhythm and normal heart sounds.  Pulmonary/Chest: Effort normal and breath sounds normal.  Abdominal: Soft. Bowel sounds are normal.  Musculoskeletal: Normal range of motion. He exhibits no edema, tenderness or deformity.  Lymphadenopathy:    He has no cervical adenopathy.  Neurological: He is alert and oriented to person, place, and time.  Skin: Skin is warm and dry. No rash noted. No erythema.  Psychiatric: He has a normal mood and affect. His behavior is normal. Judgment  and thought content normal.  Vitals reviewed.    Lab Results  Component Value Date   WBC 4.3 07/22/2017   HGB 15.0 05/20/2017   HCT 44.4 07/22/2017   MCV 100.0 (H) 07/22/2017   PLT 115 (L) 07/22/2017   No results found for: FERRITIN, IRON, TIBC, UIBC, IRONPCTSAT Lab Results  Component Value Date   RBC 4.44 07/22/2017   No results found for: KPAFRELGTCHN, LAMBDASER, KAPLAMBRATIO No results found for: IGGSERUM, IGA, IGMSERUM No results found for: TOTALPROTELP, ALBUMINELP, A1GS, A2GS, BETS, BETA2SER, GAMS, MSPIKE, SPEI   Chemistry      Component Value Date/Time   NA 139  07/22/2017 0906   NA 142 05/20/2017 1224   NA 140 12/26/2015 1013   K 3.5 07/22/2017 0906   K 3.6 05/20/2017 1224   K 4.1 12/26/2015 1013   CL 101 07/22/2017 0906   CL 104 05/20/2017 1224   CO2 29 07/22/2017 0906   CO2 27 05/20/2017 1224   CO2 24 12/26/2015 1013   BUN 11 07/22/2017 0906   BUN 10 05/20/2017 1224   BUN 16.8 12/26/2015 1013   CREATININE 0.90 07/22/2017 0906   CREATININE 1.0 05/20/2017 1224   CREATININE 0.8 12/26/2015 1013      Component Value Date/Time   CALCIUM 9.2 07/22/2017 0906   CALCIUM 9.3 05/20/2017 1224   CALCIUM 9.3 12/26/2015 1013   ALKPHOS 224 (H) 07/22/2017 0906   ALKPHOS 129 (H) 05/20/2017 1224   ALKPHOS 109 12/26/2015 1013   AST 48 (H) 07/22/2017 0906   AST 28 12/26/2015 1013   ALT 22 07/22/2017 0906   ALT 28 05/20/2017 1224   ALT 30 12/26/2015 1013   BILITOT 1.0 07/22/2017 0906   BILITOT 0.77 12/26/2015 1013     Impression and Plan: Mr. Gunther is 67 year old gentleman with metastatic colon cancer.   I had a very long talk with Mr. Faucett and his wife.  I probably spent about 45 minutes with him.  Over 50% of the time was face-to-face.  I told him that if he made it to [facility] Day weekend in late May, that I would be happy with this.  I think his wife was surprised by this.  I tried to reassure her that it is possible he may go longer.  I think that his weight  loss is indicative of his decline.  Even though we have not discussed end-of-life issues with respect to resuscitation, I am sure that he would never want to be kept alive on a machine.  He is told me that he is going to donate his body to [employer] for study.  I applauded him for this.  I answered all their questions.  We will see how he does with the oxaliplatin.  We will get him back in 3 more weeks.     Volanda Napoleon, MD 2/19/20199:48 AM

## 2017-07-22 NOTE — Patient Instructions (Signed)
[place] Discharge Instructions for Patients Receiving Chemotherapy  Today you received the following chemotherapy agents Oxaliplatin, Avastin  To help prevent nausea and vomiting after your treatment, we encourage you to take your nausea medication    If you develop nausea and vomiting that is not controlled by your nausea medication, call the clinic.   BELOW ARE SYMPTOMS THAT SHOULD BE REPORTED IMMEDIATELY:  *FEVER GREATER THAN 100.5 F  *CHILLS WITH OR WITHOUT FEVER  NAUSEA AND VOMITING THAT IS NOT CONTROLLED WITH YOUR NAUSEA MEDICATION  *UNUSUAL SHORTNESS OF BREATH  *UNUSUAL BRUISING OR BLEEDING  TENDERNESS IN MOUTH AND THROAT WITH OR WITHOUT PRESENCE OF ULCERS  *URINARY PROBLEMS  *BOWEL PROBLEMS  UNUSUAL RASH Items with * indicate a potential emergency and should be followed up as soon as possible.  Feel free to call the clinic should you have any questions or concerns. The clinic phone number is [phone].  Please show the [place] at check-in to the Emergency Department and triage nurse.

## 2017-08-12 ENCOUNTER — Encounter: Payer: Self-pay | Admitting: Hematology & Oncology

## 2017-08-12 ENCOUNTER — Inpatient Hospital Stay: Payer: Medicare Other

## 2017-08-12 ENCOUNTER — Inpatient Hospital Stay: Payer: Medicare Other | Attending: Hematology & Oncology | Admitting: Hematology & Oncology

## 2017-08-12 ENCOUNTER — Other Ambulatory Visit: Payer: Self-pay | Admitting: Oncology

## 2017-08-12 ENCOUNTER — Other Ambulatory Visit: Payer: Self-pay

## 2017-08-12 VITALS — BP 135/76 | HR 82 | Temp 98.8°F | Resp 16 | Wt 178.0 lb

## 2017-08-12 VITALS — BP 146/90

## 2017-08-12 DIAGNOSIS — C787 Secondary malignant neoplasm of liver and intrahepatic bile duct: Secondary | ICD-10-CM | POA: Diagnosis not present

## 2017-08-12 DIAGNOSIS — R600 Localized edema: Secondary | ICD-10-CM

## 2017-08-12 DIAGNOSIS — Z79899 Other long term (current) drug therapy: Secondary | ICD-10-CM | POA: Diagnosis not present

## 2017-08-12 DIAGNOSIS — Z5112 Encounter for antineoplastic immunotherapy: Secondary | ICD-10-CM | POA: Insufficient documentation

## 2017-08-12 DIAGNOSIS — R630 Anorexia: Secondary | ICD-10-CM | POA: Diagnosis not present

## 2017-08-12 DIAGNOSIS — R634 Abnormal weight loss: Secondary | ICD-10-CM

## 2017-08-12 DIAGNOSIS — M549 Dorsalgia, unspecified: Secondary | ICD-10-CM | POA: Diagnosis not present

## 2017-08-12 DIAGNOSIS — R5383 Other fatigue: Secondary | ICD-10-CM

## 2017-08-12 DIAGNOSIS — Z5111 Encounter for antineoplastic chemotherapy: Secondary | ICD-10-CM | POA: Diagnosis not present

## 2017-08-12 DIAGNOSIS — C189 Malignant neoplasm of colon, unspecified: Secondary | ICD-10-CM

## 2017-08-12 DIAGNOSIS — R110 Nausea: Secondary | ICD-10-CM | POA: Diagnosis not present

## 2017-08-12 DIAGNOSIS — R5381 Other malaise: Secondary | ICD-10-CM

## 2017-08-12 DIAGNOSIS — Z882 Allergy status to sulfonamides status: Secondary | ICD-10-CM

## 2017-08-12 DIAGNOSIS — K5900 Constipation, unspecified: Secondary | ICD-10-CM

## 2017-08-12 LAB — CBC WITH DIFFERENTIAL (CANCER CENTER ONLY)
BASOS PCT: 2 %
Basophils Absolute: 0.1 10*3/uL (ref 0.0–0.1)
Eosinophils Absolute: 0.1 10*3/uL (ref 0.0–0.5)
Eosinophils Relative: 1 %
HEMATOCRIT: 42.2 % (ref 38.7–49.9)
HEMOGLOBIN: 14.9 g/dL (ref 13.0–17.1)
LYMPHS ABS: 0.9 10*3/uL (ref 0.9–3.3)
Lymphocytes Relative: 19 %
MCH: 33.6 pg — AB (ref 28.0–33.4)
MCHC: 35.3 g/dL (ref 32.0–35.9)
MCV: 95.3 fL (ref 82.0–98.0)
MONOS PCT: 15 %
Monocytes Absolute: 0.7 10*3/uL (ref 0.1–0.9)
NEUTROS ABS: 2.8 10*3/uL (ref 1.5–6.5)
NEUTROS PCT: 63 %
Platelet Count: 107 10*3/uL — ABNORMAL LOW (ref 145–400)
RBC: 4.43 MIL/uL (ref 4.20–5.70)
RDW: 14 % (ref 11.1–15.7)
WBC: 4.5 10*3/uL (ref 4.0–10.0)

## 2017-08-12 LAB — CMP (CANCER CENTER ONLY)
ALBUMIN: 3.3 g/dL — AB (ref 3.5–5.0)
ALK PHOS: 295 U/L — AB (ref 26–84)
ALT: 38 U/L (ref 10–47)
AST: 60 U/L — ABNORMAL HIGH (ref 11–38)
Anion gap: 6 (ref 5–15)
BILIRUBIN TOTAL: 1 mg/dL (ref 0.2–1.6)
BUN: 10 mg/dL (ref 7–22)
CHLORIDE: 103 mmol/L (ref 98–108)
CO2: 30 mmol/L (ref 18–33)
CREATININE: 0.9 mg/dL (ref 0.60–1.20)
Calcium: 9.3 mg/dL (ref 8.0–10.3)
Glucose, Bld: 137 mg/dL — ABNORMAL HIGH (ref 73–118)
Potassium: 3.6 mmol/L (ref 3.3–4.7)
SODIUM: 139 mmol/L (ref 128–145)
TOTAL PROTEIN: 7.4 g/dL (ref 6.4–8.1)

## 2017-08-12 LAB — LACTATE DEHYDROGENASE: LDH: 306 U/L — AB (ref 125–245)

## 2017-08-12 LAB — CEA (IN HOUSE-CHCC): CEA (CHCC-In House): 1745.51 ng/mL — ABNORMAL HIGH (ref 0.00–5.00)

## 2017-08-12 MED ORDER — DEXTROSE 5 % IV SOLN
Freq: Once | INTRAVENOUS | Status: AC
  Administered 2017-08-12: 11:00:00 via INTRAVENOUS

## 2017-08-12 MED ORDER — PALONOSETRON HCL INJECTION 0.25 MG/5ML
0.2500 mg | Freq: Once | INTRAVENOUS | Status: AC
  Administered 2017-08-12: 0.25 mg via INTRAVENOUS

## 2017-08-12 MED ORDER — HEPARIN SOD (PORK) LOCK FLUSH 100 UNIT/ML IV SOLN
500.0000 [IU] | Freq: Once | INTRAVENOUS | Status: AC | PRN
  Administered 2017-08-12: 500 [IU]
  Filled 2017-08-12: qty 5

## 2017-08-12 MED ORDER — SODIUM CHLORIDE 0.9% FLUSH
10.0000 mL | INTRAVENOUS | Status: DC | PRN
  Filled 2017-08-12: qty 10

## 2017-08-12 MED ORDER — PALONOSETRON HCL INJECTION 0.25 MG/5ML
INTRAVENOUS | Status: AC
  Filled 2017-08-12: qty 5

## 2017-08-12 MED ORDER — DEXAMETHASONE SODIUM PHOSPHATE 10 MG/ML IJ SOLN
INTRAMUSCULAR | Status: AC
  Filled 2017-08-12: qty 1

## 2017-08-12 MED ORDER — BEVACIZUMAB CHEMO INJECTION 400 MG/16ML
14.7000 mg/kg | Freq: Once | INTRAVENOUS | Status: AC
  Administered 2017-08-12: 1300 mg via INTRAVENOUS
  Filled 2017-08-12: qty 48

## 2017-08-12 MED ORDER — DEXAMETHASONE SODIUM PHOSPHATE 10 MG/ML IJ SOLN
10.0000 mg | Freq: Once | INTRAMUSCULAR | Status: AC
  Administered 2017-08-12: 10 mg via INTRAVENOUS

## 2017-08-12 MED ORDER — OXYCODONE HCL 5 MG PO TABS: 5.0000 mg | ORAL_TABLET | Freq: Four times a day (QID) | ORAL | 0 refills | Status: DC | PRN

## 2017-08-12 MED ORDER — SODIUM CHLORIDE 0.9 % IV SOLN
Freq: Once | INTRAVENOUS | Status: AC
  Administered 2017-08-12: 09:00:00 via INTRAVENOUS

## 2017-08-12 MED ORDER — HEPARIN SOD (PORK) LOCK FLUSH 100 UNIT/ML IV SOLN
500.0000 [IU] | Freq: Once | INTRAVENOUS | Status: DC | PRN
  Filled 2017-08-12: qty 5

## 2017-08-12 MED ORDER — OXALIPLATIN CHEMO INJECTION 100 MG/20ML
98.0000 mg/m2 | Freq: Once | INTRAVENOUS | Status: AC
  Administered 2017-08-12: 200 mg via INTRAVENOUS
  Filled 2017-08-12: qty 40

## 2017-08-12 MED ORDER — SODIUM CHLORIDE 0.9% FLUSH
10.0000 mL | INTRAVENOUS | Status: DC | PRN
  Administered 2017-08-12: 10 mL
  Filled 2017-08-12: qty 10

## 2017-08-12 NOTE — Patient Instructions (Signed)
[place] Discharge Instructions for Patients Receiving Chemotherapy  Today you received the following chemotherapy agents Avastin/Oxaliplatin To help prevent nausea and vomiting after your treatment, we encourage you to take your nausea medication as prescribed.   If you develop nausea and vomiting that is not controlled by your nausea medication, call the clinic.   BELOW ARE SYMPTOMS THAT SHOULD BE REPORTED IMMEDIATELY:  *FEVER GREATER THAN 100.5 F  *CHILLS WITH OR WITHOUT FEVER  NAUSEA AND VOMITING THAT IS NOT CONTROLLED WITH YOUR NAUSEA MEDICATION  *UNUSUAL SHORTNESS OF BREATH  *UNUSUAL BRUISING OR BLEEDING  TENDERNESS IN MOUTH AND THROAT WITH OR WITHOUT PRESENCE OF ULCERS  *URINARY PROBLEMS  *BOWEL PROBLEMS  UNUSUAL RASH Items with * indicate a potential emergency and should be followed up as soon as possible.  Feel free to call the clinic should you have any questions or concerns. The clinic phone number is [phone].  Please show the [place] at check-in to the Emergency Department and triage nurse.

## 2017-08-12 NOTE — Progress Notes (Signed)
Hematology and Oncology Follow Up Visit  Ryan Maynard [id number] 1951-05-09 67 y.o. 08/12/2017   Principle Diagnosis:  Metastatic colon cancer - K-RAS wild-type - progressive  Current Therapy:   XELOX/Avastin s/p cycle 5 - stopped on 06/30/2015 XELIRI/Erbitux - s/p c#12  (28 day cycles) - omit the Irinotecan;  Status post RFA of liver metastases Clinical Trial at [facility] - Perjeta/Herceptin/Exp chemo XELOX/Avastin - s/p cycle #7 -oxaliplatin restarted on cycle 8.    Interim History:  Ryan Maynard is here today for follow-up.  Unfortunately, his CEA continues to go up.  His last CEA was over 1700.  In specific, it was 1745.  His weight is going down a little bit.  He did not do well with the Xeloda.  He is really not taking this.  He is using the lactulose.  I told him to make sure that he does not get constipated.  He is a little worried that his body will get used to taking the lactulose.  He has had no problems with pain.  He is on Percocet which is helping him.  He has had no fever.  He has had no rashes.  Is had no mouth sores.  There is been a little bit of leg swelling which is not surprising.  His appetite is slowing down.  Again, his weight is also going down.  Overall, his performance status is ECOG 1.  He still wants to keep current treatment.  I think this would be reasonable.  I really do not have a problem with him trying at least one more cycle of treatment.  We will go ahead and  . Medications: ( Allergies as of 08/12/2017      Reactions   Bactrim [sulfamethoxazole-trimethoprim] Diarrhea   Has taken 2 different times and both times frequent diarrhea   Ondansetron Hcl Other (See Comments)   Hiccups Hiccups   Pregabalin Other (See Comments)   "thought I was going to die" "thought I was going to die"      Medication List        Accurate as of 08/12/17  8:51 AM. Always use your most recent med list.          amLODipine-benazepril 10-40 MG  capsule Commonly known as:  LOTREL Take 1 capsule by mouth daily.   Coenzyme Q10 10 MG capsule Take 10 mg by mouth daily.   doxycycline 100 MG tablet Commonly known as:  VIBRA-TABS Take 1 tablet (100 mg total) by mouth 2 (two) times daily.   gabapentin 100 MG capsule Commonly known as:  NEURONTIN Take 2 capsules (200 mg total) by mouth 3 (three) times daily. Pt okay to take only once a day   ibuprofen 200 MG tablet Commonly known as:  ADVIL,MOTRIN Take 200-400 mg by mouth every 6 (six) hours as needed.   Lactulose 20 GM/30ML Soln Take 15 mLs (10 g total) by mouth every 6 (six) hours as needed.   lidocaine-prilocaine cream Commonly known as:  EMLA Apply 1 application topically as needed. Apply quarter sized amount to portacath site 1-2 hours prior to chemotherapy appt.  Cover with saran wrap.   loperamide 2 MG capsule Commonly known as:  IMODIUM Take 1 capsule (2 mg total) by mouth as needed for diarrhea or loose stools.   LORazepam 1 MG tablet Commonly known as:  ATIVAN Take 1 tablet (1 mg total) by mouth every 6 (six) hours as needed (NAUSEA).   oxyCODONE 5 MG immediate release tablet Commonly  known as:  Oxy IR/ROXICODONE Take 1 tablet (5 mg total) by mouth every 6 (six) hours as needed for severe pain.   oxymetazoline 0.05 % nasal spray Commonly known as:  AFRIN Place 1 spray into both nostrils 2 (two) times daily as needed for congestion.   polyethylene glycol packet Commonly known as:  MIRALAX / GLYCOLAX Take 17 g by mouth daily as needed.   traZODone 100 MG tablet Commonly known as:  DESYREL Take 1 tablet (100 mg total) by mouth at bedtime as needed for sleep.   XELODA 500 MG tablet Generic drug:  capecitabine TAKE 3 TABLETS BY MOUTH TWICE A DAY AFTER A MEAL FOR 14 DAYS ON, THEN 7 DAYS OFF.       Allergies:  Allergies  Allergen Reactions  . Bactrim [Sulfamethoxazole-Trimethoprim] Diarrhea    Has taken 2 different times and both times frequent diarrhea   . Ondansetron Hcl Other (See Comments)    Hiccups Hiccups  . Pregabalin Other (See Comments)    "thought I was going to die" "thought I was going to die"    Past Medical History, Surgical history, Social history, and Family History were reviewed and updated.  Review of Systems: .Review of Systems  Constitutional: Positive for malaise/fatigue and weight loss.  HENT: Negative.   Eyes: Negative.   Respiratory: Negative.   Cardiovascular: Negative.   Gastrointestinal: Positive for constipation and nausea.  Genitourinary: Negative.   Musculoskeletal: Positive for back pain.  Skin: Negative.   Neurological: Negative.   Endo/Heme/Allergies: Negative.   Psychiatric/Behavioral: Negative.     Physical Exam:  weight is 178 lb (80.7 kg). His oral temperature is 98.8 F (37.1 C). His blood pressure is 135/76 and his pulse is 82. His respiration is 16 and oxygen saturation is 98%.   Wt Readings from Last 3 Encounters:  08/12/17 178 lb (80.7 kg)  07/22/17 182 lb (82.6 kg)  07/01/17 185 lb 8 oz (84.1 kg)    Physical Exam  Constitutional: He is oriented to person, place, and time.  HENT:  Head: Normocephalic and atraumatic.  Mouth/Throat: Oropharynx is clear and moist.  Eyes: EOM are normal. Pupils are equal, round, and reactive to light.  Neck: Normal range of motion.  Cardiovascular: Normal rate, regular rhythm and normal heart sounds.  Pulmonary/Chest: Effort normal and breath sounds normal.  Abdominal: Soft. Bowel sounds are normal.  Musculoskeletal: Normal range of motion. He exhibits no edema, tenderness or deformity.  Lymphadenopathy:    He has no cervical adenopathy.  Neurological: He is alert and oriented to person, place, and time.  Skin: Skin is warm and dry. No rash noted. No erythema.  Psychiatric: He has a normal mood and affect. His behavior is normal. Judgment and thought content normal.  Vitals reviewed.    Lab Results  Component Value Date   WBC 4.5  08/12/2017   HGB 15.0 05/20/2017   HCT 42.2 08/12/2017   MCV 95.3 08/12/2017   PLT 107 (L) 08/12/2017   No results found for: FERRITIN, IRON, TIBC, UIBC, IRONPCTSAT Lab Results  Component Value Date   RBC 4.43 08/12/2017   No results found for: KPAFRELGTCHN, LAMBDASER, KAPLAMBRATIO No results found for: IGGSERUM, IGA, IGMSERUM No results found for: TOTALPROTELP, ALBUMINELP, A1GS, A2GS, BETS, BETA2SER, GAMS, MSPIKE, SPEI   Chemistry      Component Value Date/Time   NA 139 08/12/2017 0800   NA 142 05/20/2017 1224   NA 140 12/26/2015 1013   K 3.6 08/12/2017 0800   K   3.6 05/20/2017 1224   K 4.1 12/26/2015 1013   CL 103 08/12/2017 0800   CL 104 05/20/2017 1224   CO2 30 08/12/2017 0800   CO2 27 05/20/2017 1224   CO2 24 12/26/2015 1013   BUN 10 08/12/2017 0800   BUN 10 05/20/2017 1224   BUN 16.8 12/26/2015 1013   CREATININE 0.90 08/12/2017 0800   CREATININE 1.0 05/20/2017 1224   CREATININE 0.8 12/26/2015 1013      Component Value Date/Time   CALCIUM 9.3 08/12/2017 0800   CALCIUM 9.3 05/20/2017 1224   CALCIUM 9.3 12/26/2015 1013   ALKPHOS 295 (H) 08/12/2017 0800   ALKPHOS 129 (H) 05/20/2017 1224   ALKPHOS 109 12/26/2015 1013   AST 60 (H) 08/12/2017 0800   AST 28 12/26/2015 1013   ALT 38 08/12/2017 0800   ALT 28 05/20/2017 1224   ALT 30 12/26/2015 1013   BILITOT 1.0 08/12/2017 0800   BILITOT 0.77 12/26/2015 1013     Impression and Plan: Mr. Carby is 67-year-old gentleman with metastatic colon cancer.   He would really like to try another round of treatment.  I think this would be reasonable.  Hopefully, he will take the Xeloda.  The CEA will let us know what is going on.  I will refill the oxycodone for him.  We will get him back in 3 weeks.  I am still sticking to my believe that he likely will not make it through [facility] Day.      Peter R Ennever, MD 3/12/20198:51 AM   

## 2017-09-01 ENCOUNTER — Inpatient Hospital Stay: Payer: Medicare Other | Attending: Hematology & Oncology

## 2017-09-01 DIAGNOSIS — K5900 Constipation, unspecified: Secondary | ICD-10-CM | POA: Insufficient documentation

## 2017-09-01 DIAGNOSIS — Z5111 Encounter for antineoplastic chemotherapy: Secondary | ICD-10-CM | POA: Diagnosis not present

## 2017-09-01 DIAGNOSIS — R1011 Right upper quadrant pain: Secondary | ICD-10-CM | POA: Insufficient documentation

## 2017-09-01 DIAGNOSIS — R600 Localized edema: Secondary | ICD-10-CM | POA: Diagnosis not present

## 2017-09-01 DIAGNOSIS — R634 Abnormal weight loss: Secondary | ICD-10-CM | POA: Insufficient documentation

## 2017-09-01 DIAGNOSIS — R5383 Other fatigue: Secondary | ICD-10-CM | POA: Diagnosis not present

## 2017-09-01 DIAGNOSIS — Z66 Do not resuscitate: Secondary | ICD-10-CM | POA: Diagnosis not present

## 2017-09-01 DIAGNOSIS — Z9221 Personal history of antineoplastic chemotherapy: Secondary | ICD-10-CM | POA: Diagnosis not present

## 2017-09-01 DIAGNOSIS — R630 Anorexia: Secondary | ICD-10-CM | POA: Insufficient documentation

## 2017-09-01 DIAGNOSIS — J069 Acute upper respiratory infection, unspecified: Secondary | ICD-10-CM | POA: Insufficient documentation

## 2017-09-01 DIAGNOSIS — R970 Elevated carcinoembryonic antigen [CEA]: Secondary | ICD-10-CM | POA: Diagnosis not present

## 2017-09-01 DIAGNOSIS — Z7982 Long term (current) use of aspirin: Secondary | ICD-10-CM | POA: Diagnosis not present

## 2017-09-01 DIAGNOSIS — R110 Nausea: Secondary | ICD-10-CM | POA: Insufficient documentation

## 2017-09-01 DIAGNOSIS — M549 Dorsalgia, unspecified: Secondary | ICD-10-CM | POA: Diagnosis not present

## 2017-09-01 DIAGNOSIS — Z5112 Encounter for antineoplastic immunotherapy: Secondary | ICD-10-CM | POA: Diagnosis not present

## 2017-09-01 DIAGNOSIS — Z79899 Other long term (current) drug therapy: Secondary | ICD-10-CM | POA: Diagnosis not present

## 2017-09-01 DIAGNOSIS — C189 Malignant neoplasm of colon, unspecified: Secondary | ICD-10-CM | POA: Diagnosis not present

## 2017-09-01 DIAGNOSIS — R5381 Other malaise: Secondary | ICD-10-CM | POA: Diagnosis not present

## 2017-09-01 DIAGNOSIS — G893 Neoplasm related pain (acute) (chronic): Secondary | ICD-10-CM | POA: Insufficient documentation

## 2017-09-01 DIAGNOSIS — Z792 Long term (current) use of antibiotics: Secondary | ICD-10-CM | POA: Diagnosis not present

## 2017-09-01 DIAGNOSIS — C787 Secondary malignant neoplasm of liver and intrahepatic bile duct: Secondary | ICD-10-CM | POA: Insufficient documentation

## 2017-09-01 LAB — CBC WITH DIFFERENTIAL (CANCER CENTER ONLY)
BASOS ABS: 0 10*3/uL (ref 0.0–0.1)
Basophils Relative: 1 %
Eosinophils Absolute: 0.1 10*3/uL (ref 0.0–0.5)
Eosinophils Relative: 2 %
HEMATOCRIT: 42.7 % (ref 38.7–49.9)
HEMOGLOBIN: 14.4 g/dL (ref 13.0–17.1)
LYMPHS ABS: 0.8 10*3/uL — AB (ref 0.9–3.3)
LYMPHS PCT: 24 %
MCH: 32.2 pg (ref 28.0–33.4)
MCHC: 33.7 g/dL (ref 32.0–35.9)
MCV: 95.5 fL (ref 82.0–98.0)
Monocytes Absolute: 0.6 10*3/uL (ref 0.1–0.9)
Monocytes Relative: 18 %
NEUTROS ABS: 1.8 10*3/uL (ref 1.5–6.5)
NEUTROS PCT: 55 %
Platelet Count: 80 10*3/uL — ABNORMAL LOW (ref 145–400)
RBC: 4.47 MIL/uL (ref 4.20–5.70)
RDW: 13.7 % (ref 11.1–15.7)
WBC: 3.3 10*3/uL — AB (ref 4.0–10.0)

## 2017-09-01 LAB — CMP (CANCER CENTER ONLY)
ALBUMIN: 3 g/dL — AB (ref 3.5–5.0)
ALK PHOS: 348 U/L — AB (ref 40–150)
ALT: 48 U/L (ref 0–55)
ANION GAP: 7 (ref 3–11)
AST: 71 U/L — AB (ref 5–34)
BUN: 9 mg/dL (ref 7–26)
CO2: 28 mmol/L (ref 22–29)
Calcium: 9.2 mg/dL (ref 8.4–10.4)
Chloride: 105 mmol/L (ref 98–109)
Creatinine: 0.84 mg/dL (ref 0.70–1.30)
GFR, Est AFR Am: >60 mL/min (ref >60)
Glucose, Bld: 107 mg/dL (ref 70–140)
Potassium: 3.8 mmol/L (ref 3.5–5.1)
Sodium: 140 mmol/L (ref 136–145)
Total Bilirubin: 1 mg/dL (ref 0.2–1.2)
Total Protein: 7.1 g/dL (ref 6.4–8.3)

## 2017-09-01 LAB — CEA (IN HOUSE-CHCC): CEA (CHCC-In House): 2004.88 ng/mL — ABNORMAL HIGH (ref 0.00–5.00)

## 2017-09-02 ENCOUNTER — Other Ambulatory Visit: Payer: Medicare Other

## 2017-09-02 ENCOUNTER — Inpatient Hospital Stay (HOSPITAL_BASED_OUTPATIENT_CLINIC_OR_DEPARTMENT_OTHER): Payer: Medicare Other | Admitting: Hematology & Oncology

## 2017-09-02 ENCOUNTER — Other Ambulatory Visit: Payer: Self-pay

## 2017-09-02 ENCOUNTER — Inpatient Hospital Stay: Payer: Medicare Other

## 2017-09-02 VITALS — BP 142/79 | HR 86 | Temp 98.4°F | Resp 20 | Wt 179.2 lb

## 2017-09-02 DIAGNOSIS — K5900 Constipation, unspecified: Secondary | ICD-10-CM

## 2017-09-02 DIAGNOSIS — C787 Secondary malignant neoplasm of liver and intrahepatic bile duct: Secondary | ICD-10-CM | POA: Diagnosis not present

## 2017-09-02 DIAGNOSIS — Z9221 Personal history of antineoplastic chemotherapy: Secondary | ICD-10-CM | POA: Diagnosis not present

## 2017-09-02 DIAGNOSIS — J069 Acute upper respiratory infection, unspecified: Secondary | ICD-10-CM

## 2017-09-02 DIAGNOSIS — R970 Elevated carcinoembryonic antigen [CEA]: Secondary | ICD-10-CM | POA: Diagnosis not present

## 2017-09-02 DIAGNOSIS — Z792 Long term (current) use of antibiotics: Secondary | ICD-10-CM | POA: Diagnosis not present

## 2017-09-02 DIAGNOSIS — C189 Malignant neoplasm of colon, unspecified: Secondary | ICD-10-CM | POA: Diagnosis not present

## 2017-09-02 DIAGNOSIS — Z5111 Encounter for antineoplastic chemotherapy: Secondary | ICD-10-CM | POA: Diagnosis not present

## 2017-09-02 DIAGNOSIS — M549 Dorsalgia, unspecified: Secondary | ICD-10-CM

## 2017-09-02 DIAGNOSIS — Z5112 Encounter for antineoplastic immunotherapy: Secondary | ICD-10-CM | POA: Diagnosis not present

## 2017-09-02 DIAGNOSIS — R110 Nausea: Secondary | ICD-10-CM | POA: Diagnosis not present

## 2017-09-02 DIAGNOSIS — R600 Localized edema: Secondary | ICD-10-CM | POA: Diagnosis not present

## 2017-09-02 DIAGNOSIS — Z79899 Other long term (current) drug therapy: Secondary | ICD-10-CM | POA: Diagnosis not present

## 2017-09-02 MED ORDER — DEXTROSE 5 % IV SOLN
Freq: Once | INTRAVENOUS | Status: AC
  Administered 2017-09-02: 13:00:00 via INTRAVENOUS

## 2017-09-02 MED ORDER — DEXAMETHASONE SODIUM PHOSPHATE 10 MG/ML IJ SOLN
10.0000 mg | Freq: Once | INTRAMUSCULAR | Status: AC
  Administered 2017-09-02: 10 mg via INTRAVENOUS

## 2017-09-02 MED ORDER — SODIUM CHLORIDE 0.9 % IV SOLN
14.7000 mg/kg | Freq: Once | INTRAVENOUS | Status: AC
  Administered 2017-09-02: 1300 mg via INTRAVENOUS
  Filled 2017-09-02: qty 48

## 2017-09-02 MED ORDER — PALONOSETRON HCL INJECTION 0.25 MG/5ML
INTRAVENOUS | Status: AC
  Filled 2017-09-02: qty 5

## 2017-09-02 MED ORDER — DEXAMETHASONE SODIUM PHOSPHATE 10 MG/ML IJ SOLN
INTRAMUSCULAR | Status: AC
  Filled 2017-09-02: qty 1

## 2017-09-02 MED ORDER — HEPARIN SOD (PORK) LOCK FLUSH 100 UNIT/ML IV SOLN
500.0000 [IU] | Freq: Once | INTRAVENOUS | Status: AC | PRN
  Administered 2017-09-02: 500 [IU]
  Filled 2017-09-02: qty 5

## 2017-09-02 MED ORDER — PALONOSETRON HCL INJECTION 0.25 MG/5ML
0.2500 mg | Freq: Once | INTRAVENOUS | Status: AC
  Administered 2017-09-02: 0.25 mg via INTRAVENOUS

## 2017-09-02 MED ORDER — OXALIPLATIN CHEMO INJECTION 100 MG/20ML
98.0000 mg/m2 | Freq: Once | INTRAVENOUS | Status: AC
  Administered 2017-09-02: 200 mg via INTRAVENOUS
  Filled 2017-09-02: qty 40

## 2017-09-02 MED ORDER — SODIUM CHLORIDE 0.9 % IV SOLN
Freq: Once | INTRAVENOUS | Status: AC
  Administered 2017-09-02: 11:00:00 via INTRAVENOUS

## 2017-09-02 MED ORDER — SUCRALFATE 1 GM/10ML PO SUSP: 1.0000 g | Freq: Three times a day (TID) | ORAL | 2 refills | Status: DC

## 2017-09-02 MED ORDER — SODIUM CHLORIDE 0.9 % IV SOLN
3.0000 g | Freq: Once | INTRAVENOUS | Status: AC
  Administered 2017-09-02: 3 g via INTRAVENOUS
  Filled 2017-09-02: qty 3

## 2017-09-02 MED ORDER — AMOXICILLIN-POT CLAVULANATE 875-125 MG PO TABS: 1.0000 | ORAL_TABLET | Freq: Two times a day (BID) | ORAL | 0 refills | Status: AC

## 2017-09-02 MED ORDER — SODIUM CHLORIDE 0.9% FLUSH
10.0000 mL | INTRAVENOUS | Status: DC | PRN
  Administered 2017-09-02: 10 mL
  Filled 2017-09-02: qty 10

## 2017-09-02 NOTE — Progress Notes (Signed)
Hematology and Oncology Follow Up Visit  [place] Delauder [id number] 11-19-1950 67 y.o. 09/02/2017   Principle Diagnosis:  Metastatic colon cancer - K-RAS wild-type - progressive  Current Therapy:   XELOX/Avastin s/p cycle 5 - stopped on 06/30/2015 XELIRI/Erbitux - s/p c#12  (28 day cycles) - omit the Irinotecan;  Status post RFA of liver metastases Clinical Trial at [place] - Perjeta/Herceptin/Exp chemo XELOX/Avastin - s/p cycle #9 -oxaliplatin restarted on cycle 8.    Interim History:  Mr. Timberman is here today for follow-up.  Unfortunately, his CEA continues to go up.  His CEA from yesterday was 2000.  He still is not taking Xeloda.  He says it just makes him feel horribly.  He says it bothers his stomach.  I will try him on some Carafate to see if this cannot help.  I offered him the opportunity to use infusional 5-FU with the pump.  He does have the Port-A-Cath.  He does not wish to do this.  At this point, I think that our option is going to be another liver biopsy.  I think if we could get another liver biopsy I could send off for a genetic analysis.  May be, may be he does have a mutation that we can capitalize on.  I talked to him and his wife about this.  I spent about 40 minutes with he and his wife.  He is agreeable to this.  He still prioritizes quality of life as important for therapeutic decisions.  I totally understand this.  He has been mowing his yard.  He has been able to get out and do things.  This morning, he began to have upper respiratory symptoms.  He began to have purulent sputum and purulent nasal discharge.  I will give him a dose of Unasyn (3 g) in the office and then have him take Augmentin (875 mg p.o. twice daily) at home for 7 days.  Overall, his performance status is ECOG 1. He has had no fever.  He has had no rashes.  Is had no mouth sores.  There is been a little bit of leg swelling which is not surprising.  His appetite is slowing  down.  Again, his weight is also going down.  Overall, his performance status is ECOG 1.  He still wants to keep current treatment.  I think this would be reasonable.  I really do not have a problem with him trying at least one more cycle of treatment.  We will go ahead and  . Medications: ( Allergies as of 09/02/2017      Reactions   Bactrim [sulfamethoxazole-trimethoprim] Diarrhea   Has taken 2 different times and both times frequent diarrhea   Ondansetron Hcl Other (See Comments)   Hiccups Hiccups   Pregabalin Other (See Comments)   "thought I was going to die" "thought I was going to die"      Medication List        Accurate as of 09/02/17 11:40 AM. Always use your most recent med list.          amLODipine-benazepril 10-40 MG capsule Commonly known as:  LOTREL Take 1 capsule by mouth daily.   amoxicillin-clavulanate 875-125 MG tablet Commonly known as:  AUGMENTIN Take 1 tablet by mouth 2 (two) times daily for 7 days.   Coenzyme Q10 10 MG capsule Take 10 mg by mouth daily.   gabapentin 100 MG capsule Commonly known as:  NEURONTIN Take 2 capsules (200 mg total) by  mouth 3 (three) times daily. Pt okay to take only once a day   ibuprofen 200 MG tablet Commonly known as:  ADVIL,MOTRIN Take 200-400 mg by mouth every 6 (six) hours as needed.   Lactulose 20 GM/30ML Soln Take 15 mLs (10 g total) by mouth every 6 (six) hours as needed.   lidocaine-prilocaine cream Commonly known as:  EMLA Apply 1 application topically as needed. Apply quarter sized amount to portacath site 1-2 hours prior to chemotherapy appt.  Cover with saran wrap.   loperamide 2 MG capsule Commonly known as:  IMODIUM Take 1 capsule (2 mg total) by mouth as needed for diarrhea or loose stools.   LORazepam 1 MG tablet Commonly known as:  ATIVAN Take 1 tablet (1 mg total) by mouth every 6 (six) hours as needed (NAUSEA).   oxyCODONE 5 MG immediate release tablet Commonly known as:  Oxy  IR/ROXICODONE Take 1 tablet (5 mg total) by mouth every 6 (six) hours as needed for severe pain.   oxymetazoline 0.05 % nasal spray Commonly known as:  AFRIN Place 1 spray into both nostrils 2 (two) times daily as needed for congestion.   polyethylene glycol packet Commonly known as:  MIRALAX / GLYCOLAX Take 17 g by mouth daily as needed.   sucralfate 1 GM/10ML suspension Commonly known as:  CARAFATE Take 10 mLs (1 g total) by mouth 4 (four) times daily -  with meals and at bedtime.   traZODone 100 MG tablet Commonly known as:  DESYREL Take 1 tablet (100 mg total) by mouth at bedtime as needed for sleep.   XELODA 500 MG tablet Generic drug:  capecitabine TAKE 3 TABLETS BY MOUTH TWICE A DAY AFTER A MEAL FOR 14 DAYS ON, THEN 7 DAYS OFF.       Allergies:  Allergies  Allergen Reactions  . Bactrim [Sulfamethoxazole-Trimethoprim] Diarrhea    Has taken 2 different times and both times frequent diarrhea  . Ondansetron Hcl Other (See Comments)    Hiccups Hiccups  . Pregabalin Other (See Comments)    "thought I was going to die" "thought I was going to die"    Past Medical History, Surgical history, Social history, and Family History were reviewed and updated.  Review of Systems: .Review of Systems  Constitutional: Positive for malaise/fatigue and weight loss.  HENT: Negative.   Eyes: Negative.   Respiratory: Negative.   Cardiovascular: Negative.   Gastrointestinal: Positive for constipation and nausea.  Genitourinary: Negative.   Musculoskeletal: Positive for back pain.  Skin: Negative.   Neurological: Negative.   Endo/Heme/Allergies: Negative.   Psychiatric/Behavioral: Negative.     Physical Exam:  vitals were not taken for this visit.   Wt Readings from Last 3 Encounters:  09/02/17 179 lb 4 oz (81.3 kg)  08/12/17 178 lb (80.7 kg)  07/22/17 182 lb (82.6 kg)    Physical Exam  Constitutional: He is oriented to person, place, and time.  HENT:  Head:  Normocephalic and atraumatic.  Mouth/Throat: Oropharynx is clear and moist.  Eyes: Pupils are equal, round, and reactive to light. EOM are normal.  Neck: Normal range of motion.  Cardiovascular: Normal rate, regular rhythm and normal heart sounds.  Pulmonary/Chest: Effort normal and breath sounds normal.  Abdominal: Soft. Bowel sounds are normal.  Musculoskeletal: Normal range of motion. He exhibits no edema, tenderness or deformity.  Lymphadenopathy:    He has no cervical adenopathy.  Neurological: He is alert and oriented to person, place, and time.  Skin: Skin is  warm and dry. No rash noted. No erythema.  Psychiatric: He has a normal mood and affect. His behavior is normal. Judgment and thought content normal.  Vitals reviewed.    Lab Results  Component Value Date   WBC 3.3 (L) 09/01/2017   HGB 15.0 05/20/2017   HCT 42.7 09/01/2017   MCV 95.5 09/01/2017   PLT 80 (L) 09/01/2017   No results found for: FERRITIN, IRON, TIBC, UIBC, IRONPCTSAT Lab Results  Component Value Date   RBC 4.47 09/01/2017   No results found for: KPAFRELGTCHN, LAMBDASER, KAPLAMBRATIO No results found for: IGGSERUM, IGA, IGMSERUM No results found for: Kathrynn Ducking, MSPIKE, SPEI   Chemistry      Component Value Date/Time   NA 140 09/01/2017 0959   NA 142 05/20/2017 1224   NA 140 12/26/2015 1013   K 3.8 09/01/2017 0959   K 3.6 05/20/2017 1224   K 4.1 12/26/2015 1013   CL 105 09/01/2017 0959   CL 104 05/20/2017 1224   CO2 28 09/01/2017 0959   CO2 27 05/20/2017 1224   CO2 24 12/26/2015 1013   BUN 9 09/01/2017 0959   BUN 10 05/20/2017 1224   BUN 16.8 12/26/2015 1013   CREATININE 0.84 09/01/2017 0959   CREATININE 1.0 05/20/2017 1224   CREATININE 0.8 12/26/2015 1013      Component Value Date/Time   CALCIUM 9.2 09/01/2017 0959   CALCIUM 9.3 05/20/2017 1224   CALCIUM 9.3 12/26/2015 1013   ALKPHOS 348 (H) 09/01/2017 0959   ALKPHOS 129 (H) 05/20/2017  1224   ALKPHOS 109 12/26/2015 1013   AST 71 (H) 09/01/2017 0959   AST 28 12/26/2015 1013   ALT 48 09/01/2017 0959   ALT 28 05/20/2017 1224   ALT 30 12/26/2015 1013   BILITOT 1.0 09/01/2017 0959   BILITOT 0.77 12/26/2015 1013     Impression and Plan: Mr. Monrreal is 67 year old gentleman with metastatic colon cancer.   He would really like to try another round of treatment.  I think this would be reasonable.  Hopefully, he will take the Xeloda.  His next cycle will be 4 weeks.  I think given the extra week of Easter will be nice for him.  I am sure that he and his family would like to have that week off so he can enjoy being with family.    Volanda Napoleon, MD 4/2/201911:40 AM

## 2017-09-02 NOTE — Patient Instructions (Signed)
[facility] Discharge Instructions for Patients Receiving Chemotherapy  Today you received the following chemotherapy agents Oxaliplatin, Avastin.  To help prevent nausea and vomiting after your treatment, we encourage you to take your nausea medication as indicated by your MD.   If you develop nausea and vomiting that is not controlled by your nausea medication, call the clinic.   BELOW ARE SYMPTOMS THAT SHOULD BE REPORTED IMMEDIATELY:  *FEVER GREATER THAN 100.5 F  *CHILLS WITH OR WITHOUT FEVER  NAUSEA AND VOMITING THAT IS NOT CONTROLLED WITH YOUR NAUSEA MEDICATION  *UNUSUAL SHORTNESS OF BREATH  *UNUSUAL BRUISING OR BLEEDING  TENDERNESS IN MOUTH AND THROAT WITH OR WITHOUT PRESENCE OF ULCERS  *URINARY PROBLEMS  *BOWEL PROBLEMS  UNUSUAL RASH Items with * indicate a potential emergency and should be followed up as soon as possible.  Feel free to call the clinic should you have any questions or concerns. The clinic phone number is [phone].  Please show the [facility] at check-in to the Emergency Department and triage nurse.

## 2017-09-02 NOTE — Progress Notes (Signed)
OK to treat with yesterday's labs values per Dr. Marin Olp.

## 2017-09-09 ENCOUNTER — Encounter: Payer: Self-pay | Admitting: Family

## 2017-09-10 ENCOUNTER — Other Ambulatory Visit: Payer: Self-pay | Admitting: Radiology

## 2017-09-10 ENCOUNTER — Other Ambulatory Visit: Payer: Self-pay | Admitting: Physician Assistant

## 2017-09-11 ENCOUNTER — Encounter (HOSPITAL_COMMUNITY): Payer: Self-pay

## 2017-09-11 ENCOUNTER — Ambulatory Visit (HOSPITAL_COMMUNITY)
Admission: RE | Admit: 2017-09-11 | Discharge: 2017-09-11 | Disposition: A | Discharge status: Home / Self Care | Payer: Medicare Other | Source: Ambulatory Visit | Attending: Hematology & Oncology | Admitting: Hematology & Oncology

## 2017-09-11 ENCOUNTER — Other Ambulatory Visit: Payer: Self-pay | Admitting: Hematology & Oncology

## 2017-09-11 DIAGNOSIS — C189 Malignant neoplasm of colon, unspecified: Secondary | ICD-10-CM | POA: Insufficient documentation

## 2017-09-11 DIAGNOSIS — C787 Secondary malignant neoplasm of liver and intrahepatic bile duct: Secondary | ICD-10-CM | POA: Diagnosis not present

## 2017-09-11 DIAGNOSIS — K7689 Other specified diseases of liver: Secondary | ICD-10-CM | POA: Diagnosis not present

## 2017-09-11 DIAGNOSIS — R911 Solitary pulmonary nodule: Secondary | ICD-10-CM | POA: Diagnosis not present

## 2017-09-11 DIAGNOSIS — C229 Malignant neoplasm of liver, not specified as primary or secondary: Secondary | ICD-10-CM | POA: Diagnosis not present

## 2017-09-11 LAB — CBC
HCT: 43 % (ref 39.0–52.0)
Hemoglobin: 14.3 g/dL (ref 13.0–17.0)
MCH: 31.2 pg (ref 26.0–34.0)
MCHC: 33.3 g/dL (ref 30.0–36.0)
MCV: 93.7 fL (ref 78.0–100.0)
PLATELETS: 76 10*3/uL — AB (ref 150–400)
RBC: 4.59 MIL/uL (ref 4.22–5.81)
RDW: 14.2 % (ref 11.5–15.5)
WBC: 4.2 10*3/uL (ref 4.0–10.5)

## 2017-09-11 LAB — PROTIME-INR
INR: 1.03
PROTHROMBIN TIME: 13.4 s (ref 11.4–15.2)

## 2017-09-11 MED ORDER — GELATIN ABSORBABLE 12-7 MM EX MISC
CUTANEOUS | Status: AC
  Filled 2017-09-11: qty 1

## 2017-09-11 MED ORDER — LIDOCAINE HCL (PF) 1 % IJ SOLN
INTRAMUSCULAR | Status: AC
  Filled 2017-09-11: qty 30

## 2017-09-11 MED ORDER — FENTANYL CITRATE (PF) 100 MCG/2ML IJ SOLN
INTRAMUSCULAR | Status: DC | PRN
  Administered 2017-09-11 (×4): 25 ug via INTRAVENOUS
  Administered 2017-09-11: 50 ug via INTRAVENOUS
  Administered 2017-09-11: 25 ug via INTRAVENOUS

## 2017-09-11 MED ORDER — FENTANYL CITRATE (PF) 100 MCG/2ML IJ SOLN
INTRAMUSCULAR | Status: AC
  Filled 2017-09-11: qty 4

## 2017-09-11 MED ORDER — OXYCODONE HCL 5 MG PO TABS: 10.0000 mg | ORAL_TABLET | ORAL | Status: DC | PRN

## 2017-09-11 MED ORDER — SODIUM CHLORIDE 0.9 % IV SOLN: INTRAVENOUS | Status: DC

## 2017-09-11 MED ORDER — OXYCODONE HCL 5 MG PO TABS
5.0000 mg | ORAL_TABLET | ORAL | Status: DC | PRN
  Filled 2017-09-11: qty 1

## 2017-09-11 MED ORDER — MIDAZOLAM HCL 2 MG/2ML IJ SOLN
INTRAMUSCULAR | Status: DC | PRN
  Administered 2017-09-11: 0.5 mg via INTRAVENOUS
  Administered 2017-09-11: 1 mg via INTRAVENOUS
  Administered 2017-09-11 (×3): 0.5 mg via INTRAVENOUS

## 2017-09-11 MED ORDER — LIDOCAINE HCL 1 % IJ SOLN
INTRAMUSCULAR | Status: AC
  Filled 2017-09-11: qty 20

## 2017-09-11 MED ORDER — MIDAZOLAM HCL 2 MG/2ML IJ SOLN
INTRAMUSCULAR | Status: AC
  Filled 2017-09-11: qty 4

## 2017-09-11 NOTE — H&P (Signed)
Chief Complaint: Patient was seen in consultation today for colon cancer with liver metastasis  Referring Physician(s): Dr. Marin Olp  Supervising Physician: Markus Daft  Patient Status: [facility] - Out-pt  History of Present Illness: Ryan Maynard is a 67 y.o. male with past medical history of metastatic colon cancer to the liver presents for liver mass biopsy at the request of Dr. Marin Olp.  Patient known to IR from Y-90 embolization procedure in 2016.  Unfortunately, his liver disease has progressed.  He is planning for biopsy today for genetic testing and treatment strategy.   Patient presents today in his usual state of health.  Case reviewed and approved by Dr. Kathlene Cote.  He has been NPO.  He does not take blood thinners.   Past Medical History:  Diagnosis Date  . Colon cancer metastasized to liver ([facility]) 06/15/2013  . Counseling regarding goals of care 11/07/2016  . History of chemotherapy jan 2016  . Hypertension   . Nasal congestion   . PONV (postoperative nausea and vomiting) age 19    Past Surgical History:  Procedure Laterality Date  . IR GENERIC HISTORICAL  07/14/2014   IR RADIOLOGIST EVAL & MGMT 07/14/2014 Markus Daft, MD [facility] INTERV RAD  . IR GENERIC HISTORICAL  08/11/2014   IR RADIOLOGIST EVAL & MGMT 08/11/2014 Markus Daft, MD [facility] INTERV RAD  . port a cath insertion  14 months ago   right chest  . right hip growth removed     . skin grafts  age 42    Allergies: Bactrim [sulfamethoxazole-trimethoprim]; Ondansetron hcl; and Pregabalin  Medications: Prior to Admission medications   Medication Sig Start Date End Date Taking? Authorizing Provider  amLODipine-benazepril (LOTREL) 10-40 MG capsule Take 1 capsule by mouth daily. 01/14/17   Volanda Napoleon, MD  aspirin 325 MG EC tablet Take 325 mg by mouth daily as needed (Headaches).    [provider]  Coenzyme Q10 10 MG capsule Take 10 mg by mouth daily.    [provider]  diphenhydrAMINE (BENADRYL) 25  mg capsule Take 25 mg by mouth daily.    [provider]  gabapentin (NEURONTIN) 100 MG capsule Take 2 capsules (200 mg total) by mouth 3 (three) times daily. Pt okay to take only once a day 01/23/16   Volanda Napoleon, MD  ibuprofen (ADVIL,MOTRIN) 200 MG tablet Take 200-400 mg by mouth every 6 (six) hours as needed.    [provider]  Lactulose 20 GM/30ML SOLN Take 15 mLs (10 g total) by mouth every 6 (six) hours as needed. 07/01/17   Volanda Napoleon, MD  lidocaine-prilocaine (EMLA) cream Apply 1 application topically as needed. Apply quarter sized amount to portacath site 1-2 hours prior to chemotherapy appt.  Cover with saran wrap. 06/23/13   Volanda Napoleon, MD  loperamide (IMODIUM) 2 MG capsule Take 1 capsule (2 mg total) by mouth as needed for diarrhea or loose stools. 01/14/17   Volanda Napoleon, MD  LORazepam (ATIVAN) 1 MG tablet Take 1 tablet (1 mg total) by mouth every 6 (six) hours as needed (NAUSEA). 01/04/16   Volanda Napoleon, MD  oxyCODONE (OXY IR/ROXICODONE) 5 MG immediate release tablet Take 1 tablet (5 mg total) by mouth every 6 (six) hours as needed for severe pain. 08/12/17   Volanda Napoleon, MD  oxymetazoline (AFRIN) 0.05 % nasal spray Place 1 spray into both nostrils 2 (two) times daily as needed for congestion.    [provider]  sucralfate (CARAFATE) 1  GM/10ML suspension Take 10 mLs (1 g total) by mouth 4 (four) times daily -  with meals and at bedtime. 09/02/17   Volanda Napoleon, MD  traZODone (DESYREL) 100 MG tablet Take 1 tablet (100 mg total) by mouth at bedtime as needed for sleep. Patient taking differently: Take 100 mg by mouth at bedtime.  07/01/17   Volanda Napoleon, MD  XELODA 500 MG tablet TAKE 3 TABLETS BY MOUTH TWICE A DAY AFTER A MEAL FOR 14 DAYS ON, THEN 7 DAYS OFF. 06/06/17   Volanda Napoleon, MD     No family history on file.  Social History   Socioeconomic History  . Marital status: Single    Spouse name: Not on file  . Number of  children: Not on file  . Years of education: Not on file  . Highest education level: Not on file  Occupational History  . Not on file  Social Needs  . Financial resource strain: Not on file  . Food insecurity:    Worry: Not on file    Inability: Not on file  . Transportation needs:    Medical: Not on file    Non-medical: Not on file  Tobacco Use  . Smoking status: Never Smoker  . Smokeless tobacco: Never Used  . Tobacco comment: never used tobacco  Substance and Sexual Activity  . Alcohol use: Yes    Alcohol/week: 0.0 oz    Comment: occasional wine  . Drug use: No  . Sexual activity: Not on file  Lifestyle  . Physical activity:    Days per week: Not on file    Minutes per session: Not on file  . Stress: Not on file  Relationships  . Social connections:    Talks on phone: Not on file    Gets together: Not on file    Attends religious service: Not on file    Active member of club or organization: Not on file    Attends meetings of clubs or organizations: Not on file    Relationship status: Not on file  Other Topics Concern  . Not on file  Social History Narrative  . Not on file     Review of Systems: A 12 point ROS discussed and pertinent positives are indicated in the HPI above.  All other systems are negative.  Review of Systems  Constitutional: Negative for fatigue and fever.  Respiratory: Negative for cough and shortness of breath.   Cardiovascular: Negative for chest pain.  Gastrointestinal: Negative for abdominal pain.  Musculoskeletal: Negative for back pain.  Psychiatric/Behavioral: Negative for behavioral problems and confusion.    Vital Signs: There were no vitals taken for this visit.  Physical Exam  Constitutional: He is oriented to person, place, and time. He appears well-developed.  Cardiovascular: Normal rate, regular rhythm and normal heart sounds.  Pulmonary/Chest: Effort normal and breath sounds normal. No respiratory distress.  Abdominal:  Soft. He exhibits no distension.  Neurological: He is alert and oriented to person, place, and time.  Skin: Skin is warm and dry.  Psychiatric: He has a normal mood and affect. His behavior is normal. Judgment and thought content normal.  Nursing note and vitals reviewed.        Imaging: No results found.  Labs:  CBC: Recent Labs    03/17/17 0755 04/08/17 0835 04/29/17 1203 05/20/17 1224  07/01/17 0743 07/22/17 0906 08/12/17 0800 09/01/17 0959  WBC 4.4 5.0 4.7 5.2   < > 4.5 4.3 4.5 3.3*  HGB 15.1 14.8 15.1 15.0  --   --   --   --   --   HCT 42.4 41.8 41.9 42.2   < > 39.5 44.4 42.2 42.7  PLT 141* 126* 116* 121*   < > 120* 115* 107* 80*   < > = values in this interval not displayed.    COAGS: No results for input(s): INR, APTT in the last 8760 hours.  BMP: Recent Labs    06/10/17 0755 07/01/17 0743 07/22/17 0906 08/12/17 0800 09/01/17 0959  NA 139 139 139 139 140  K 3.4* 3.4 3.5 3.6 3.8  CL 101 107 101 103 105  CO2 27 28 29 30 28   GLUCOSE 146* 146* 139* 137* 107  BUN 8 13 11 10 9   CALCIUM 9.2 8.7 9.2 9.3 9.2  CREATININE 0.90 1.10 0.90 0.90 0.84  GFRNONAA >60  --   --   --  >60  GFRAA >60  --   --   --  >60    LIVER FUNCTION TESTS: Recent Labs    07/01/17 0743 07/22/17 0906 08/12/17 0800 09/01/17 0959  BILITOT 1.0 1.0 1.0 1.0  AST 48* 48* 60* 71*  ALT 31 22 38 48  ALKPHOS 173* 224* 295* 348*  PROT 7.3 8.0 7.4 7.1  ALBUMIN 3.4* 3.4* 3.3* 3.0*    TUMOR MARKERS: No results for input(s): AFPTM, CEA, CA199, CHROMGRNA in the last 8760 hours.  Assessment and Plan: Patient with past medical history of metastatic colon cancer presents with complaint of progressively enlarging liver masses.  IR consulted for liver mass biopsy at the request of Dr. Marin Olp. Case reviewed by Dr. Kathlene Cote who approves patient for procedure.  Patient presents today in their usual state of health.  He has been NPO and is not currently on blood thinners.   Risks and  benefits discussed with the patient including, but not limited to bleeding, infection, damage to adjacent structures or low yield requiring additional tests.  All of the patient's questions were answered, patient is agreeable to proceed. Consent signed and in chart.  Thank you for this interesting consult.  I greatly enjoyed meeting Ryan Maynard and look forward to participating in their care.  A copy of this report was sent to the requesting provider on this date.  Electronically Signed: Docia Barrier, PA 09/11/2017, 6:35 AM   I spent a total of    25 Minutes in face to face in clinical consultation, greater than 50% of which was counseling/coordinating care for metastatic colon cancer.

## 2017-09-11 NOTE — Procedures (Signed)
[place] and CT guided liver lesion biopsy.  3 cores from a left hepatic lesion.  Minimal blood loss and no immediate complication.  Plan for 3 hours bedrest.

## 2017-09-11 NOTE — Discharge Instructions (Signed)
Liver Biopsy, Care After °These instructions give you information on caring for yourself after your procedure. Your doctor may also give you more specific instructions. Call your doctor if you have any problems or questions after your procedure. °Follow these instructions at home: °· Rest at home for 1-2 days or as told by your doctor. °· Have someone stay with you for at least 24 hours. °· Do not do these things in the first 24 hours: °? Drive. °? Use machinery. °? Take care of other people. °? Sign legal documents. °? Take a bath or shower. °· There are many different ways to close and cover a cut (incision). For example, a cut can be closed with stitches, skin glue, or adhesive strips. Follow your doctor's instructions on: °? Taking care of your cut. °? Changing and removing your bandage (dressing). °? Removing whatever was used to close your cut. °· Do not drink alcohol in the first week. °· Do not lift more than 5 pounds or play contact sports for the first 2 weeks. °· Take medicines only as told by your doctor. For 1 week, do not take medicine that has aspirin in it or medicines like ibuprofen. °· Get your test results. °Contact a doctor if: °· A cut bleeds and leaves more than just a small spot of blood. °· A cut is red, puffs up (swells), or hurts more than before. °· Fluid or something else comes from a cut. °· A cut smells bad. °· You have a fever or chills. °Get help right away if: °· You have swelling, bloating, or pain in your belly (abdomen). °· You get dizzy or faint. °· You have a rash. °· You feel sick to your stomach (nauseous) or throw up (vomit). °· You have trouble breathing, feel short of breath, or feel faint. °· Your chest hurts. °· You have problems talking or seeing. °· You have trouble balancing or moving your arms or legs. °This information is not intended to replace advice given to you by your health care provider. Make sure you discuss any questions you have with your health care  provider. °Document Released: 02/27/2008 Document Revised: 10/26/2015 Document Reviewed: 07/16/2013 °Elsevier Interactive Patient Education © 2018 Else[employer] Inc[employer] ° °

## 2017-09-11 NOTE — Sedation Documentation (Signed)
Pt transferred to CT via stretcher.  

## 2017-09-11 NOTE — Sedation Documentation (Signed)
Called short stay for bed nurse will call me back

## 2017-09-11 NOTE — Discharge Instructions (Signed)
Moderate Conscious Sedation, Adult, Care After °These instructions provide you with information about caring for yourself after your procedure. Your health care provider may also give you more specific instructions. Your treatment has been planned according to current medical practices, but problems sometimes occur. Call your health care provider if you have any problems or questions after your procedure. °What can I expect after the procedure? °After your procedure, it is common: °· To feel sleepy for several hours. °· To feel clumsy and have poor balance for several hours. °· To have poor judgment for several hours. °· To vomit if you eat too soon. ° °Follow these instructions at home: °For at least 24 hours after the procedure: ° °· Do not: °? Participate in activities where you could fall or become injured. °? Drive. °? Use heavy machinery. °? Drink alcohol. °? Take sleeping pills or medicines that cause drowsiness. °? Make important decisions or sign legal documents. °? Take care of children on your own. °· Rest. °Eating and drinking °· Follow the diet recommended by your health care provider. °· If you vomit: °? Drink water, juice, or soup when you can drink without vomiting. °? Make sure you have little or no nausea before eating solid foods. °General instructions °· Have a responsible adult stay with you until you are awake and alert. °· Take over-the-counter and prescription medicines only as told by your health care provider. °· If you smoke, do not smoke without supervision. °· Keep all follow-up visits as told by your health care provider. This is important. °Contact a health care provider if: °· You keep feeling nauseous or you keep vomiting. °· You feel light-headed. °· You develop a rash. °· You have a fever. °Get help right away if: °· You have trouble breathing. °This information is not intended to replace advice given to you by your health care provider. Make sure you discuss any questions you have  with your health care provider. °Document Released: 03/10/2013 Document Revised: 10/23/2015 Document Reviewed: 09/09/2015 °Elsevier Interactive Patient Education © 2018 [employer] Inc[employer] ° ° °Liver Biopsy, Care After °These instructions give you information on caring for yourself after your procedure. Your doctor may also give you more specific instructions. Call your doctor if you have any problems or questions after your procedure. °Follow these instructions at home: °· Rest at home for 1-2 days or as told by your doctor. °· Have someone stay with you for at least 24 hours. °· Do not do these things in the first 24 hours: °? Drive. °? Use machinery. °? Take care of other people. °? Sign legal documents. °? Take a bath or shower. °· There are many different ways to close and cover a cut (incision). For example, a cut can be closed with stitches, skin glue, or adhesive strips. Follow your doctor's instructions on: °? Taking care of your cut. °? Changing and removing your bandage (dressing). °? Removing whatever was used to close your cut. °· Do not drink alcohol in the first week. °· Do not lift more than 5 pounds or play contact sports for the first 2 weeks. °· Take medicines only as told by your doctor. For 1 week, do not take medicine that has aspirin in it or medicines like ibuprofen. °· Get your test results. °Contact a doctor if: °· A cut bleeds and leaves more than just a small spot of blood. °· A cut is red, puffs up (swells), or hurts more than before. °· Fluid or something else   comes from a cut. °· A cut smells bad. °· You have a fever or chills. °Get help right away if: °· You have swelling, bloating, or pain in your belly (abdomen). °· You get dizzy or faint. °· You have a rash. °· You feel sick to your stomach (nauseous) or throw up (vomit). °· You have trouble breathing, feel short of breath, or feel faint. °· Your chest hurts. °· You have problems talking or seeing. °· You have trouble balancing or moving  your arms or legs. °This information is not intended to replace advice given to you by your health care provider. Make sure you discuss any questions you have with your health care provider. °Document Released: 02/27/2008 Document Revised: 10/26/2015 Document Reviewed: 07/16/2013 °Elsevier Interactive Patient Education © 2018 Elsevier Inc. ° ° °

## 2017-09-15 ENCOUNTER — Telehealth: Payer: Self-pay | Admitting: *Deleted

## 2017-09-15 NOTE — Telephone Encounter (Signed)
Per Dr Merrily Pew One testing added to specimen [phone].  Spoke to [place] at [facility], In Pathology

## 2017-09-23 ENCOUNTER — Other Ambulatory Visit: Payer: Medicare Other

## 2017-09-23 ENCOUNTER — Ambulatory Visit: Payer: Medicare Other

## 2017-09-23 ENCOUNTER — Ambulatory Visit: Payer: Medicare Other | Admitting: Hematology & Oncology

## 2017-09-24 DIAGNOSIS — C189 Malignant neoplasm of colon, unspecified: Secondary | ICD-10-CM | POA: Diagnosis not present

## 2017-09-24 DIAGNOSIS — C787 Secondary malignant neoplasm of liver and intrahepatic bile duct: Secondary | ICD-10-CM | POA: Diagnosis not present

## 2017-09-29 ENCOUNTER — Inpatient Hospital Stay: Payer: Medicare Other

## 2017-09-29 DIAGNOSIS — R970 Elevated carcinoembryonic antigen [CEA]: Secondary | ICD-10-CM | POA: Diagnosis not present

## 2017-09-29 DIAGNOSIS — C787 Secondary malignant neoplasm of liver and intrahepatic bile duct: Secondary | ICD-10-CM | POA: Diagnosis not present

## 2017-09-29 DIAGNOSIS — C189 Malignant neoplasm of colon, unspecified: Secondary | ICD-10-CM | POA: Diagnosis not present

## 2017-09-29 DIAGNOSIS — Z5111 Encounter for antineoplastic chemotherapy: Secondary | ICD-10-CM | POA: Diagnosis not present

## 2017-09-29 DIAGNOSIS — Z5112 Encounter for antineoplastic immunotherapy: Secondary | ICD-10-CM | POA: Diagnosis not present

## 2017-09-29 DIAGNOSIS — Z9221 Personal history of antineoplastic chemotherapy: Secondary | ICD-10-CM | POA: Diagnosis not present

## 2017-09-29 LAB — CEA (IN HOUSE-CHCC): CEA (CHCC-IN HOUSE): 2355 ng/mL — AB (ref 0.00–5.00)

## 2017-09-29 LAB — CBC WITH DIFFERENTIAL (CANCER CENTER ONLY)
BASOS ABS: 0 10*3/uL (ref 0.0–0.1)
Basophils Relative: 1 %
EOS ABS: 0 10*3/uL (ref 0.0–0.5)
EOS PCT: 1 %
HEMATOCRIT: 47 % (ref 38.7–49.9)
HEMOGLOBIN: 15.7 g/dL (ref 13.0–17.1)
LYMPHS ABS: 0.5 10*3/uL — AB (ref 0.9–3.3)
LYMPHS PCT: 16 %
MCH: 31.1 pg (ref 28.0–33.4)
MCHC: 33.4 g/dL (ref 32.0–35.9)
MCV: 93.1 fL (ref 82.0–98.0)
MONO ABS: 0.5 10*3/uL (ref 0.1–0.9)
Monocytes Relative: 14 %
Neutro Abs: 2.1 10*3/uL (ref 1.5–6.5)
Neutrophils Relative %: 68 %
Platelet Count: 69 10*3/uL — ABNORMAL LOW (ref 145–400)
RBC: 5.05 MIL/uL (ref 4.20–5.70)
RDW: 14 % (ref 11.1–15.7)
WBC Count: 3.1 10*3/uL — ABNORMAL LOW (ref 4.0–10.0)

## 2017-09-29 LAB — CMP (CANCER CENTER ONLY)
ALBUMIN: 3.1 g/dL — AB (ref 3.5–5.0)
ALT: 63 U/L — AB (ref 10–47)
AST: 96 U/L — ABNORMAL HIGH (ref 11–38)
Alkaline Phosphatase: 385 U/L — ABNORMAL HIGH (ref 26–84)
Anion gap: 3 — ABNORMAL LOW (ref 5–15)
BUN: 8 mg/dL (ref 7–22)
CHLORIDE: 105 mmol/L (ref 98–108)
CO2: 31 mmol/L (ref 18–33)
CREATININE: 0.8 mg/dL (ref 0.60–1.20)
Calcium: 9.3 mg/dL (ref 8.0–10.3)
GLUCOSE: 133 mg/dL — AB (ref 73–118)
POTASSIUM: 3.6 mmol/L (ref 3.3–4.7)
Sodium: 139 mmol/L (ref 128–145)
Total Bilirubin: 1.5 mg/dL (ref 0.2–1.6)
Total Protein: 7.2 g/dL (ref 6.4–8.1)

## 2017-09-29 LAB — IRON AND TIBC
IRON: 48 ug/dL (ref 42–163)
SATURATION RATIOS: 22 % — AB (ref 42–163)
TIBC: 221 ug/dL (ref 202–409)
UIBC: 173 ug/dL

## 2017-09-29 LAB — FERRITIN: FERRITIN: 625 ng/mL — AB (ref 22–316)

## 2017-09-29 LAB — LACTATE DEHYDROGENASE: LDH: 365 U/L — AB (ref 125–245)

## 2017-09-30 ENCOUNTER — Inpatient Hospital Stay (HOSPITAL_BASED_OUTPATIENT_CLINIC_OR_DEPARTMENT_OTHER): Payer: Medicare Other | Admitting: Hematology & Oncology

## 2017-09-30 ENCOUNTER — Inpatient Hospital Stay: Payer: Medicare Other

## 2017-09-30 ENCOUNTER — Telehealth: Payer: Self-pay | Admitting: *Deleted

## 2017-09-30 DIAGNOSIS — R5383 Other fatigue: Secondary | ICD-10-CM

## 2017-09-30 DIAGNOSIS — R634 Abnormal weight loss: Secondary | ICD-10-CM

## 2017-09-30 DIAGNOSIS — C189 Malignant neoplasm of colon, unspecified: Secondary | ICD-10-CM

## 2017-09-30 DIAGNOSIS — R1011 Right upper quadrant pain: Secondary | ICD-10-CM | POA: Diagnosis not present

## 2017-09-30 DIAGNOSIS — C787 Secondary malignant neoplasm of liver and intrahepatic bile duct: Secondary | ICD-10-CM | POA: Diagnosis not present

## 2017-09-30 DIAGNOSIS — Z7982 Long term (current) use of aspirin: Secondary | ICD-10-CM

## 2017-09-30 DIAGNOSIS — K5900 Constipation, unspecified: Secondary | ICD-10-CM

## 2017-09-30 DIAGNOSIS — M549 Dorsalgia, unspecified: Secondary | ICD-10-CM

## 2017-09-30 DIAGNOSIS — Z9221 Personal history of antineoplastic chemotherapy: Secondary | ICD-10-CM | POA: Diagnosis not present

## 2017-09-30 DIAGNOSIS — Z5112 Encounter for antineoplastic immunotherapy: Secondary | ICD-10-CM | POA: Diagnosis not present

## 2017-09-30 DIAGNOSIS — R970 Elevated carcinoembryonic antigen [CEA]: Secondary | ICD-10-CM

## 2017-09-30 DIAGNOSIS — G893 Neoplasm related pain (acute) (chronic): Secondary | ICD-10-CM | POA: Diagnosis not present

## 2017-09-30 DIAGNOSIS — R110 Nausea: Secondary | ICD-10-CM | POA: Diagnosis not present

## 2017-09-30 DIAGNOSIS — R630 Anorexia: Secondary | ICD-10-CM | POA: Diagnosis not present

## 2017-09-30 DIAGNOSIS — Z79899 Other long term (current) drug therapy: Secondary | ICD-10-CM

## 2017-09-30 DIAGNOSIS — Z5111 Encounter for antineoplastic chemotherapy: Secondary | ICD-10-CM | POA: Diagnosis not present

## 2017-09-30 DIAGNOSIS — Z66 Do not resuscitate: Secondary | ICD-10-CM

## 2017-09-30 DIAGNOSIS — R5381 Other malaise: Secondary | ICD-10-CM | POA: Diagnosis not present

## 2017-09-30 MED ORDER — OXYCODONE HCL 5 MG PO TABS: 5.0000 mg | ORAL_TABLET | Freq: Four times a day (QID) | ORAL | 0 refills | Status: AC | PRN

## 2017-09-30 NOTE — Progress Notes (Signed)
Hematology and Oncology Follow Up Visit  Ryan Maynard [id number] June 30, 1950 67 y.o. 09/30/2017   Principle Diagnosis:  Metastatic colon cancer - K-RAS wild-type - progressive  Current Therapy:   XELOX/Avastin s/p cycle 5 - stopped on 06/30/2015 XELIRI/Erbitux - s/p c#12  (28 day cycles) - omit the Irinotecan;  Status post RFA of liver metastases Clinical Trial at [facility] - Perjeta/Herceptin/Exp chemo XELOX/Avastin - s/p cycle #9 -oxaliplatin restarted on cycle 8.  D/C'ed on 09/30/2017 due to progression    Interim History:  Ryan Maynard is here today for follow-up.  As hard as he is trying, his cancer is now progressing more quickly.  We did go ahead and get another biopsy of his tumor hoping that we might be able to find a genetic alteration that we can target.  Unfortunately, we did not find anything different with his tumor.  He is microsatellite stable.  He has a low TMB.  He has wild-type for BRAF, K-ras, and NRAS.  His CEA is going up.  It is now 2355.  Had a very long talk with he and his wife.  I think that we are now at the point that we have to consider comfort care and his quality of life.  I just do not think that giving him further chemotherapy is going to benefit him.  He really has been on most lines of chemotherapy.  I just do not believe that he would do well with further chemotherapy.  He does not want to have a 5-FU pump.  We talked about end-of-life issues.  He wants to try to stay at home.  He does not feel that he needs hospice yet.  I told him that I would still call hospice so that there would at least know him and be prepared for the time comes.  He is having more pain.  His weight is going down.  His appetite is decreasing.  These are all indicators of his disease progression.  The pain that he is having is in the right upper quadrant.  I am sure that this is from his malignancy.  He has had no bleeding.  He has had no diarrhea.  He is incredibly  upset that a dog killed his cat.  This really upset him.  It was on the day that he has liver biopsy.  He really has been dejected by this.  He has had no leg swelling.  He has had no cough or shortness of breath.  He has had no fever.  Overall, his performance status is ECOG 1. . Medications: ( Allergies as of 09/30/2017      Reactions   Bactrim [sulfamethoxazole-trimethoprim] Diarrhea   Has taken 2 different times and both times frequent diarrhea   Ondansetron Hcl Other (See Comments)   Hiccups Hiccups   Pregabalin Other (See Comments)   "thought I was going to die" "thought I was going to die"      Medication List        Accurate as of 09/30/17 10:49 AM. Always use your most recent med list.          amLODipine-benazepril 10-40 MG capsule Commonly known as:  LOTREL Take 1 capsule by mouth daily.   aspirin 325 MG EC tablet Take 325 mg by mouth daily as needed (Headaches).   Coenzyme Q10 10 MG capsule Take 10 mg by mouth daily.   diphenhydrAMINE 25 mg capsule Commonly known as:  BENADRYL Take 25 mg by mouth daily.  gabapentin 100 MG capsule Commonly known as:  NEURONTIN Take 2 capsules (200 mg total) by mouth 3 (three) times daily. Pt okay to take only once a day   ibuprofen 200 MG tablet Commonly known as:  ADVIL,MOTRIN Take 200-400 mg by mouth every 6 (six) hours as needed.   Lactulose 20 GM/30ML Soln Take 15 mLs (10 g total) by mouth every 6 (six) hours as needed.   lidocaine-prilocaine cream Commonly known as:  EMLA Apply 1 application topically as needed. Apply quarter sized amount to portacath site 1-2 hours prior to chemotherapy appt.  Cover with saran wrap.   loperamide 2 MG capsule Commonly known as:  IMODIUM Take 1 capsule (2 mg total) by mouth as needed for diarrhea or loose stools.   LORazepam 1 MG tablet Commonly known as:  ATIVAN Take 1 tablet (1 mg total) by mouth every 6 (six) hours as needed (NAUSEA).   oxyCODONE 5 MG immediate release  tablet Commonly known as:  Oxy IR/ROXICODONE Take 1 tablet (5 mg total) by mouth every 6 (six) hours as needed for severe pain.   oxymetazoline 0.05 % nasal spray Commonly known as:  AFRIN Place 1 spray into both nostrils 2 (two) times daily as needed for congestion.   traZODone 100 MG tablet Commonly known as:  DESYREL Take 1 tablet (100 mg total) by mouth at bedtime as needed for sleep.       Allergies:  Allergies  Allergen Reactions  . Bactrim [Sulfamethoxazole-Trimethoprim] Diarrhea    Has taken 2 different times and both times frequent diarrhea  . Ondansetron Hcl Other (See Comments)    Hiccups Hiccups  . Pregabalin Other (See Comments)    "thought I was going to die" "thought I was going to die"    Past Medical History, Surgical history, Social history, and Family History were reviewed and updated.  Review of Systems: .Review of Systems  Constitutional: Positive for malaise/fatigue and weight loss.  HENT: Negative.   Eyes: Negative.   Respiratory: Negative.   Cardiovascular: Negative.   Gastrointestinal: Positive for constipation and nausea.  Genitourinary: Negative.   Musculoskeletal: Positive for back pain.  Skin: Negative.   Neurological: Negative.   Endo/Heme/Allergies: Negative.   Psychiatric/Behavioral: Negative.     Physical Exam:  vitals were not taken for this visit.   Wt Readings from Last 3 Encounters:  09/11/17 175 lb (79.4 kg)  09/02/17 179 lb 4 oz (81.3 kg)  08/12/17 178 lb (80.7 kg)    Physical Exam  Constitutional: He is oriented to person, place, and time.  HENT:  Head: Normocephalic and atraumatic.  Mouth/Throat: Oropharynx is clear and moist.  Eyes: Pupils are equal, round, and reactive to light. EOM are normal.  Neck: Normal range of motion.  Cardiovascular: Normal rate, regular rhythm and normal heart sounds.  Pulmonary/Chest: Effort normal and breath sounds normal.  Abdominal: Soft. Bowel sounds are normal.  Musculoskeletal:  Normal range of motion. He exhibits no edema, tenderness or deformity.  Lymphadenopathy:    He has no cervical adenopathy.  Neurological: He is alert and oriented to person, place, and time.  Skin: Skin is warm and dry. No rash noted. No erythema.  Psychiatric: He has a normal mood and affect. His behavior is normal. Judgment and thought content normal.  Vitals reviewed.    Lab Results  Component Value Date   WBC 3.1 (L) 09/29/2017   HGB 15.7 09/29/2017   HCT 47.0 09/29/2017   MCV 93.1 09/29/2017   PLT 69 (  L) 09/29/2017   Lab Results  Component Value Date   FERRITIN 625 (H) 09/29/2017   IRON 48 09/29/2017   TIBC 221 09/29/2017   UIBC 173 09/29/2017   IRONPCTSAT 22 (L) 09/29/2017   Lab Results  Component Value Date   RBC 5.05 09/29/2017   No results found for: KPAFRELGTCHN, LAMBDASER, KAPLAMBRATIO No results found for: IGGSERUM, IGA, IGMSERUM No results found for: Odetta Pink, SPEI   Chemistry      Component Value Date/Time   NA 139 09/29/2017 1104   NA 142 05/20/2017 1224   NA 140 12/26/2015 1013   K 3.6 09/29/2017 1104   K 3.6 05/20/2017 1224   K 4.1 12/26/2015 1013   CL 105 09/29/2017 1104   CL 104 05/20/2017 1224   CO2 31 09/29/2017 1104   CO2 27 05/20/2017 1224   CO2 24 12/26/2015 1013   BUN 8 09/29/2017 1104   BUN 10 05/20/2017 1224   BUN 16.8 12/26/2015 1013   CREATININE 0.80 09/29/2017 1104   CREATININE 1.0 05/20/2017 1224   CREATININE 0.8 12/26/2015 1013      Component Value Date/Time   CALCIUM 9.3 09/29/2017 1104   CALCIUM 9.3 05/20/2017 1224   CALCIUM 9.3 12/26/2015 1013   ALKPHOS 385 (H) 09/29/2017 1104   ALKPHOS 129 (H) 05/20/2017 1224   ALKPHOS 109 12/26/2015 1013   AST 96 (H) 09/29/2017 1104   AST 28 12/26/2015 1013   ALT 63 (H) 09/29/2017 1104   ALT 28 05/20/2017 1224   ALT 30 12/26/2015 1013   BILITOT 1.5 09/29/2017 1104   BILITOT 0.77 12/26/2015 1013     Impression and Plan:  Ryan Maynard is 67 year old gentleman with metastatic colon cancer.   We now are "shifting gears" and focusing on his comfort and quality of life.  We did talk about his wishes for life support.  He and his wife have talked about this already.  He does not want to be kept alive on machines.  I totally understand this.  As such, we will make sure that he is a DO NOT RESUSCITATE.  I totally agree with this.  I asked him if he wants to know what I thought his life expectancy was.  He said he would like to know.  I was brutally honest with he and his wife and told him that I thought that if he made it to the end of May, I would be surprised.  He was not really not surprised by this.  I would like to see him back in 3 weeks.  I just want to make sure that we are still focusing on his quality of life.  I spent close to an hour with he and his wife today.  This was a very difficult conversation for me personally as I really have enjoyed helping him and giving him the best quality is that he could have over the past 4 years.    Volanda Napoleon, MD 4/30/201910:49 AM

## 2017-09-30 NOTE — Telephone Encounter (Signed)
Hospice referral per Dr Marin Olp.  Spoke with Delsa Sale at [facility]. Referral made. Requested that they make contact with the patient next week.

## 2017-10-02 ENCOUNTER — Encounter (HOSPITAL_COMMUNITY): Payer: Self-pay | Admitting: Hematology & Oncology

## 2017-10-06 ENCOUNTER — Telehealth: Payer: Self-pay | Admitting: *Deleted

## 2017-10-06 NOTE — Telephone Encounter (Signed)
Message received from patient's wife requesting Hospice be from [facility].  Call placed to [facility] with pt referral.  Call received back from [facility] stating that pt would like [facility].  Call and referral placed to [facility]. [facility] states that they will contact pt today.

## 2017-10-08 DIAGNOSIS — C187 Malignant neoplasm of sigmoid colon: Secondary | ICD-10-CM | POA: Diagnosis not present

## 2017-10-08 DIAGNOSIS — I10 Essential (primary) hypertension: Secondary | ICD-10-CM | POA: Diagnosis not present

## 2017-10-08 DIAGNOSIS — C787 Secondary malignant neoplasm of liver and intrahepatic bile duct: Secondary | ICD-10-CM | POA: Diagnosis not present

## 2017-10-08 DIAGNOSIS — Z452 Encounter for adjustment and management of vascular access device: Secondary | ICD-10-CM | POA: Diagnosis not present

## 2017-10-13 ENCOUNTER — Telehealth: Payer: Self-pay | Admitting: *Deleted

## 2017-10-13 ENCOUNTER — Other Ambulatory Visit: Payer: Self-pay | Admitting: *Deleted

## 2017-10-13 DIAGNOSIS — C787 Secondary malignant neoplasm of liver and intrahepatic bile duct: Secondary | ICD-10-CM | POA: Diagnosis not present

## 2017-10-13 DIAGNOSIS — Z452 Encounter for adjustment and management of vascular access device: Secondary | ICD-10-CM | POA: Diagnosis not present

## 2017-10-13 DIAGNOSIS — I10 Essential (primary) hypertension: Secondary | ICD-10-CM | POA: Diagnosis not present

## 2017-10-13 DIAGNOSIS — C187 Malignant neoplasm of sigmoid colon: Secondary | ICD-10-CM | POA: Diagnosis not present

## 2017-10-13 MED ORDER — PANTOPRAZOLE SODIUM 40 MG PO TBEC: 40.0000 mg | DELAYED_RELEASE_TABLET | Freq: Two times a day (BID) | ORAL | 3 refills | Status: AC

## 2017-10-13 NOTE — Telephone Encounter (Signed)
Received a call from Ryan Maynard about her husband Ryan Maynard.  This morning patient experienced nausea and subsequently threw up a good amount of bright red blood.  They just wanted Dr. Marin Olp to know.  Hospice nurse called and will be coming out to see patient today.  Dr. Marin Olp notified.  Rx sent to [facility] for Protonix .  [facility] notified.

## 2017-10-14 ENCOUNTER — Ambulatory Visit: Payer: Medicare Other

## 2017-10-14 ENCOUNTER — Other Ambulatory Visit: Payer: Medicare Other

## 2017-10-14 ENCOUNTER — Ambulatory Visit: Payer: Medicare Other | Admitting: Hematology & Oncology

## 2017-10-14 DIAGNOSIS — C187 Malignant neoplasm of sigmoid colon: Secondary | ICD-10-CM | POA: Diagnosis not present

## 2017-10-14 DIAGNOSIS — I10 Essential (primary) hypertension: Secondary | ICD-10-CM | POA: Diagnosis not present

## 2017-10-14 DIAGNOSIS — C787 Secondary malignant neoplasm of liver and intrahepatic bile duct: Secondary | ICD-10-CM | POA: Diagnosis not present

## 2017-10-14 DIAGNOSIS — Z452 Encounter for adjustment and management of vascular access device: Secondary | ICD-10-CM | POA: Diagnosis not present

## 2017-10-15 DIAGNOSIS — I10 Essential (primary) hypertension: Secondary | ICD-10-CM | POA: Diagnosis not present

## 2017-10-15 DIAGNOSIS — Z452 Encounter for adjustment and management of vascular access device: Secondary | ICD-10-CM | POA: Diagnosis not present

## 2017-10-15 DIAGNOSIS — C787 Secondary malignant neoplasm of liver and intrahepatic bile duct: Secondary | ICD-10-CM | POA: Diagnosis not present

## 2017-10-15 DIAGNOSIS — C187 Malignant neoplasm of sigmoid colon: Secondary | ICD-10-CM | POA: Diagnosis not present

## 2017-10-20 DIAGNOSIS — I10 Essential (primary) hypertension: Secondary | ICD-10-CM | POA: Diagnosis not present

## 2017-10-20 DIAGNOSIS — C787 Secondary malignant neoplasm of liver and intrahepatic bile duct: Secondary | ICD-10-CM | POA: Diagnosis not present

## 2017-10-20 DIAGNOSIS — C187 Malignant neoplasm of sigmoid colon: Secondary | ICD-10-CM | POA: Diagnosis not present

## 2017-10-20 DIAGNOSIS — Z452 Encounter for adjustment and management of vascular access device: Secondary | ICD-10-CM | POA: Diagnosis not present

## 2017-10-21 ENCOUNTER — Ambulatory Visit: Payer: Medicare Other | Admitting: Family

## 2017-10-21 ENCOUNTER — Ambulatory Visit: Payer: Medicare Other

## 2017-10-21 ENCOUNTER — Other Ambulatory Visit: Payer: Medicare Other

## 2017-10-23 ENCOUNTER — Other Ambulatory Visit: Payer: Self-pay | Admitting: *Deleted

## 2017-10-24 ENCOUNTER — Encounter: Payer: Self-pay | Admitting: Hematology & Oncology

## 2017-10-24 ENCOUNTER — Inpatient Hospital Stay (HOSPITAL_BASED_OUTPATIENT_CLINIC_OR_DEPARTMENT_OTHER): Admitting: Hematology & Oncology

## 2017-10-24 ENCOUNTER — Ambulatory Visit: Payer: Medicare Other

## 2017-10-24 ENCOUNTER — Inpatient Hospital Stay: Attending: Hematology & Oncology

## 2017-10-24 ENCOUNTER — Other Ambulatory Visit: Payer: Self-pay

## 2017-10-24 VITALS — BP 107/65 | HR 102 | Temp 98.7°F | Resp 20 | Wt 160.0 lb

## 2017-10-24 DIAGNOSIS — R110 Nausea: Secondary | ICD-10-CM | POA: Diagnosis not present

## 2017-10-24 DIAGNOSIS — R5381 Other malaise: Secondary | ICD-10-CM | POA: Insufficient documentation

## 2017-10-24 DIAGNOSIS — C787 Secondary malignant neoplasm of liver and intrahepatic bile duct: Secondary | ICD-10-CM

## 2017-10-24 DIAGNOSIS — M549 Dorsalgia, unspecified: Secondary | ICD-10-CM | POA: Diagnosis not present

## 2017-10-24 DIAGNOSIS — Z7982 Long term (current) use of aspirin: Secondary | ICD-10-CM | POA: Insufficient documentation

## 2017-10-24 DIAGNOSIS — R634 Abnormal weight loss: Secondary | ICD-10-CM

## 2017-10-24 DIAGNOSIS — K5900 Constipation, unspecified: Secondary | ICD-10-CM | POA: Diagnosis not present

## 2017-10-24 DIAGNOSIS — R042 Hemoptysis: Secondary | ICD-10-CM | POA: Diagnosis not present

## 2017-10-24 DIAGNOSIS — R5383 Other fatigue: Secondary | ICD-10-CM | POA: Insufficient documentation

## 2017-10-24 DIAGNOSIS — Z79899 Other long term (current) drug therapy: Secondary | ICD-10-CM | POA: Diagnosis not present

## 2017-10-24 DIAGNOSIS — C189 Malignant neoplasm of colon, unspecified: Secondary | ICD-10-CM | POA: Diagnosis not present

## 2017-10-24 LAB — CMP (CANCER CENTER ONLY)
ALBUMIN: 2.9 g/dL — AB (ref 3.5–5.0)
ALK PHOS: 415 U/L — AB (ref 26–84)
ALT: 39 U/L (ref 10–47)
AST: 59 U/L — AB (ref 11–38)
Anion gap: 8 (ref 5–15)
BILIRUBIN TOTAL: 1 mg/dL (ref 0.2–1.6)
BUN: 10 mg/dL (ref 7–22)
CALCIUM: 9.4 mg/dL (ref 8.0–10.3)
CO2: 28 mmol/L (ref 18–33)
CREATININE: 0.8 mg/dL (ref 0.60–1.20)
Chloride: 105 mmol/L (ref 98–108)
Glucose, Bld: 148 mg/dL — ABNORMAL HIGH (ref 73–118)
Potassium: 3.7 mmol/L (ref 3.3–4.7)
Sodium: 141 mmol/L (ref 128–145)
TOTAL PROTEIN: 6.9 g/dL (ref 6.4–8.1)

## 2017-10-24 LAB — CBC WITH DIFFERENTIAL (CANCER CENTER ONLY)
BASOS PCT: 1 %
Basophils Absolute: 0 10*3/uL (ref 0.0–0.1)
Eosinophils Absolute: 0 10*3/uL (ref 0.0–0.5)
Eosinophils Relative: 1 %
HEMATOCRIT: 34 % — AB (ref 38.7–49.9)
HEMOGLOBIN: 11.1 g/dL — AB (ref 13.0–17.1)
LYMPHS PCT: 17 %
Lymphs Abs: 0.8 10*3/uL — ABNORMAL LOW (ref 0.9–3.3)
MCH: 30.6 pg (ref 28.0–33.4)
MCHC: 32.6 g/dL (ref 32.0–35.9)
MCV: 93.7 fL (ref 82.0–98.0)
MONO ABS: 0.5 10*3/uL (ref 0.1–0.9)
Monocytes Relative: 10 %
NEUTROS ABS: 3.4 10*3/uL (ref 1.5–6.5)
NEUTROS PCT: 71 %
Platelet Count: 182 10*3/uL (ref 145–400)
RBC: 3.63 MIL/uL — AB (ref 4.20–5.70)
RDW: 15 % (ref 11.1–15.7)
WBC Count: 4.8 10*3/uL (ref 4.0–10.0)

## 2017-10-24 NOTE — Progress Notes (Signed)
Hematology and Oncology Follow Up Visit  Ryan Maynard [id number] May 26, 1951 68 y.o. 10/24/2017   Principle Diagnosis:  Metastatic colon cancer - K-RAS wild-type - progressive  Current Therapy:   XELOX/Avastin s/p cycle 5 - stopped on 06/30/2015 XELIRI/Erbitux - s/p c#12  (28 day cycles) - omit the Irinotecan;  Status post RFA of liver metastases Clinical Trial at [facility] - Perjeta/Herceptin/Exp chemo XELOX/Avastin - s/p cycle #9 -oxaliplatin restarted on cycle 8.  D/C'ed on 09/30/2017 due to progression    Interim History:  Ryan Maynard is here today for follow-up.  He comes in with his wife.  I am absolutely ecstatic over how well he is doing right now.  He is being followed by hospice.  A couple weeks ago, he was having some hemoptysis.  I thought for sure that he was having hepatic failure and having rapid development of varices.  However, this all seemed to stop.  I think some of this has to do with his blood pressure under much better control.  By then the main reason is that the Avastin is now out of his system.  His weight is down.  However, he is eating.  He is having no nausea or vomiting currently.  He has had no diarrhea.  He has had no rashes.  He has had no leg swelling.  They are really want to go to the [employer] Day weekend.  For the way he looks right now, I do not see any problem with him going.  I think the biggest issue for him is that he can no longer mow his yard.  He really enjoyed mowing his yard.  However, he just cannot put in the effort now.  Overall, his performance status is ECOG 3 0.81m 0.1. . Medications: ( Allergies as of 10/24/2017      Reactions   Bactrim [sulfamethoxazole-trimethoprim] Diarrhea   Has taken 2 different times and both times frequent diarrhea   Ondansetron Hcl Other (See Comments)   Hiccups Hiccups Hiccups   Pregabalin Other (See Comments)   "thought I was going to die" "thought I was going to die" "thought  I was going to die"      Medication List        Accurate as of 10/24/17 12:20 PM. Always use your most recent med list.          amLODipine-benazepril 10-40 MG capsule Commonly known as:  LOTREL Take 1 capsule by mouth daily.   aspirin 325 MG EC tablet Take 325 mg by mouth daily as needed (Headaches).   Coenzyme Q10 10 MG capsule Take 10 mg by mouth daily.   diphenhydrAMINE 25 mg capsule Commonly known as:  BENADRYL Take 25 mg by mouth daily.   gabapentin 100 MG capsule Commonly known as:  NEURONTIN Take 2 capsules (200 mg total) by mouth 3 (three) times daily. Pt okay to take only once a day   ibuprofen 200 MG tablet Commonly known as:  ADVIL,MOTRIN Take 200-400 mg by mouth every 6 (six) hours as needed.   Lactulose 20 GM/30ML Soln Take 15 mLs (10 g total) by mouth every 6 (six) hours as needed.   lidocaine-prilocaine cream Commonly known as:  EMLA Apply 1 application topically as needed. Apply quarter sized amount to portacath site 1-2 hours prior to chemotherapy appt.  Cover with saran wrap.   loperamide 2 MG capsule Commonly known as:  IMODIUM Take 1 capsule (2 mg total) by mouth as needed for diarrhea or  loose stools.   LORazepam 1 MG tablet Commonly known as:  ATIVAN Take 1 tablet (1 mg total) by mouth every 6 (six) hours as needed (NAUSEA).   oxyCODONE 5 MG immediate release tablet Commonly known as:  Oxy IR/ROXICODONE Take 1 tablet (5 mg total) by mouth every 6 (six) hours as needed for severe pain.   oxymetazoline 0.05 % nasal spray Commonly known as:  AFRIN Place 1 spray into both nostrils 2 (two) times daily as needed for congestion.   pantoprazole 40 MG tablet Commonly known as:  PROTONIX Take 1 tablet (40 mg total) by mouth 2 (two) times daily.   traZODone 100 MG tablet Commonly known as:  DESYREL Take 1 tablet (100 mg total) by mouth at bedtime as needed for sleep.       Allergies:  Allergies  Allergen Reactions  . Bactrim  [Sulfamethoxazole-Trimethoprim] Diarrhea    Has taken 2 different times and both times frequent diarrhea  . Ondansetron Hcl Other (See Comments)    Hiccups Hiccups Hiccups  . Pregabalin Other (See Comments)    "thought I was going to die" "thought I was going to die" "thought I was going to die"    Past Medical History, Surgical history, Social history, and Family History were reviewed and updated.  Review of Systems: .Review of Systems  Constitutional: Positive for malaise/fatigue and weight loss.  HENT: Negative.   Eyes: Negative.   Respiratory: Negative.   Cardiovascular: Negative.   Gastrointestinal: Positive for constipation and nausea.  Genitourinary: Negative.   Musculoskeletal: Positive for back pain.  Skin: Negative.   Neurological: Negative.   Endo/Heme/Allergies: Negative.   Psychiatric/Behavioral: Negative.     Physical Exam:  weight is 160 lb (72.6 kg). His oral temperature is 98.7 F (37.1 C). His blood pressure is 107/65 and his pulse is 102 (abnormal). His respiration is 20 and oxygen saturation is 100%.   Wt Readings from Last 3 Encounters:  10/24/17 160 lb (72.6 kg)  09/11/17 175 lb (79.4 kg)  09/02/17 179 lb 4 oz (81.3 kg)    Physical Exam  Constitutional: He is oriented to person, place, and time.  HENT:  Head: Normocephalic and atraumatic.  Mouth/Throat: Oropharynx is clear and moist.  Eyes: Pupils are equal, round, and reactive to light. EOM are normal.  Neck: Normal range of motion.  Cardiovascular: Normal rate, regular rhythm and normal heart sounds.  Pulmonary/Chest: Effort normal and breath sounds normal.  Abdominal: Soft. Bowel sounds are normal.  Musculoskeletal: Normal range of motion. He exhibits no edema, tenderness or deformity.  Lymphadenopathy:    He has no cervical adenopathy.  Neurological: He is alert and oriented to person, place, and time.  Skin: Skin is warm and dry. No rash noted. No erythema.  Psychiatric: He has a  normal mood and affect. His behavior is normal. Judgment and thought content normal.  Vitals reviewed.    Lab Results  Component Value Date   WBC 4.8 10/24/2017   HGB 11.1 (L) 10/24/2017   HCT 34.0 (L) 10/24/2017   MCV 93.7 10/24/2017   PLT 182 10/24/2017   Lab Results  Component Value Date   FERRITIN 625 (H) 09/29/2017   IRON 48 09/29/2017   TIBC 221 09/29/2017   UIBC 173 09/29/2017   IRONPCTSAT 22 (L) 09/29/2017   Lab Results  Component Value Date   RBC 3.63 (L) 10/24/2017   No results found for: KPAFRELGTCHN, LAMBDASER, KAPLAMBRATIO No results found for: IGGSERUM, IGA, IGMSERUM No results found for:  Odetta Pink, SPEI   Chemistry      Component Value Date/Time   NA 141 10/24/2017 1119   NA 142 05/20/2017 1224   NA 140 12/26/2015 1013   K 3.7 10/24/2017 1119   K 3.6 05/20/2017 1224   K 4.1 12/26/2015 1013   CL 105 10/24/2017 1119   CL 104 05/20/2017 1224   CO2 28 10/24/2017 1119   CO2 27 05/20/2017 1224   CO2 24 12/26/2015 1013   BUN 10 10/24/2017 1119   BUN 10 05/20/2017 1224   BUN 16.8 12/26/2015 1013   CREATININE 0.80 10/24/2017 1119   CREATININE 1.0 05/20/2017 1224   CREATININE 0.8 12/26/2015 1013      Component Value Date/Time   CALCIUM 9.4 10/24/2017 1119   CALCIUM 9.3 05/20/2017 1224   CALCIUM 9.3 12/26/2015 1013   ALKPHOS 415 (H) 10/24/2017 1119   ALKPHOS 129 (H) 05/20/2017 1224   ALKPHOS 109 12/26/2015 1013   AST 59 (H) 10/24/2017 1119   AST 28 12/26/2015 1013   ALT 39 10/24/2017 1119   ALT 28 05/20/2017 1224   ALT 30 12/26/2015 1013   BILITOT 1.0 10/24/2017 1119   BILITOT 0.77 12/26/2015 1013     Impression and Plan: Ryan Maynard is 67 year old gentleman with metastatic colon cancer.   He really is doing nicely right now.  Hospice is doing a fantastic job with him.  We will get him back in another 3 weeks.  I would like to see him before he goes to the mountains.  Hopefully, he will  continue to be doing well.  It is nice to see that he and his wife are still having a good quality of life together and will hopefully be able to enjoy the mountains where they go every summer.      Volanda Napoleon, MD 5/24/201912:20 PM

## 2017-10-27 DIAGNOSIS — Z452 Encounter for adjustment and management of vascular access device: Secondary | ICD-10-CM | POA: Diagnosis not present

## 2017-10-27 DIAGNOSIS — C787 Secondary malignant neoplasm of liver and intrahepatic bile duct: Secondary | ICD-10-CM | POA: Diagnosis not present

## 2017-10-27 DIAGNOSIS — I10 Essential (primary) hypertension: Secondary | ICD-10-CM | POA: Diagnosis not present

## 2017-10-27 DIAGNOSIS — C187 Malignant neoplasm of sigmoid colon: Secondary | ICD-10-CM | POA: Diagnosis not present

## 2017-11-01 DIAGNOSIS — Z452 Encounter for adjustment and management of vascular access device: Secondary | ICD-10-CM | POA: Diagnosis not present

## 2017-11-01 DIAGNOSIS — I10 Essential (primary) hypertension: Secondary | ICD-10-CM | POA: Diagnosis not present

## 2017-11-01 DIAGNOSIS — C787 Secondary malignant neoplasm of liver and intrahepatic bile duct: Secondary | ICD-10-CM | POA: Diagnosis not present

## 2017-11-01 DIAGNOSIS — C187 Malignant neoplasm of sigmoid colon: Secondary | ICD-10-CM | POA: Diagnosis not present

## 2017-11-03 DIAGNOSIS — I10 Essential (primary) hypertension: Secondary | ICD-10-CM | POA: Diagnosis not present

## 2017-11-03 DIAGNOSIS — C187 Malignant neoplasm of sigmoid colon: Secondary | ICD-10-CM | POA: Diagnosis not present

## 2017-11-03 DIAGNOSIS — Z452 Encounter for adjustment and management of vascular access device: Secondary | ICD-10-CM | POA: Diagnosis not present

## 2017-11-03 DIAGNOSIS — C787 Secondary malignant neoplasm of liver and intrahepatic bile duct: Secondary | ICD-10-CM | POA: Diagnosis not present

## 2017-11-05 DIAGNOSIS — I10 Essential (primary) hypertension: Secondary | ICD-10-CM | POA: Diagnosis not present

## 2017-11-05 DIAGNOSIS — Z452 Encounter for adjustment and management of vascular access device: Secondary | ICD-10-CM | POA: Diagnosis not present

## 2017-11-05 DIAGNOSIS — C187 Malignant neoplasm of sigmoid colon: Secondary | ICD-10-CM | POA: Diagnosis not present

## 2017-11-05 DIAGNOSIS — C787 Secondary malignant neoplasm of liver and intrahepatic bile duct: Secondary | ICD-10-CM | POA: Diagnosis not present

## 2017-11-10 DIAGNOSIS — Z452 Encounter for adjustment and management of vascular access device: Secondary | ICD-10-CM | POA: Diagnosis not present

## 2017-11-10 DIAGNOSIS — I10 Essential (primary) hypertension: Secondary | ICD-10-CM | POA: Diagnosis not present

## 2017-11-10 DIAGNOSIS — C787 Secondary malignant neoplasm of liver and intrahepatic bile duct: Secondary | ICD-10-CM | POA: Diagnosis not present

## 2017-11-10 DIAGNOSIS — C187 Malignant neoplasm of sigmoid colon: Secondary | ICD-10-CM | POA: Diagnosis not present

## 2017-11-11 ENCOUNTER — Ambulatory Visit: Payer: Medicare Other | Admitting: Hematology & Oncology

## 2017-11-11 ENCOUNTER — Ambulatory Visit: Payer: Medicare Other

## 2017-11-11 ENCOUNTER — Other Ambulatory Visit: Payer: Medicare Other

## 2017-11-14 ENCOUNTER — Inpatient Hospital Stay

## 2017-11-14 ENCOUNTER — Inpatient Hospital Stay (HOSPITAL_BASED_OUTPATIENT_CLINIC_OR_DEPARTMENT_OTHER): Admitting: Hematology & Oncology

## 2017-11-14 ENCOUNTER — Inpatient Hospital Stay: Attending: Hematology & Oncology

## 2017-11-14 ENCOUNTER — Encounter: Payer: Self-pay | Admitting: Hematology & Oncology

## 2017-11-14 ENCOUNTER — Other Ambulatory Visit: Payer: Self-pay

## 2017-11-14 ENCOUNTER — Other Ambulatory Visit: Payer: Self-pay | Admitting: *Deleted

## 2017-11-14 VITALS — BP 112/65 | HR 86 | Temp 98.2°F | Resp 19 | Wt 167.0 lb

## 2017-11-14 DIAGNOSIS — R5383 Other fatigue: Secondary | ICD-10-CM

## 2017-11-14 DIAGNOSIS — K5900 Constipation, unspecified: Secondary | ICD-10-CM

## 2017-11-14 DIAGNOSIS — Z7982 Long term (current) use of aspirin: Secondary | ICD-10-CM

## 2017-11-14 DIAGNOSIS — M549 Dorsalgia, unspecified: Secondary | ICD-10-CM | POA: Insufficient documentation

## 2017-11-14 DIAGNOSIS — C189 Malignant neoplasm of colon, unspecified: Secondary | ICD-10-CM | POA: Diagnosis not present

## 2017-11-14 DIAGNOSIS — R110 Nausea: Secondary | ICD-10-CM

## 2017-11-14 DIAGNOSIS — Z9221 Personal history of antineoplastic chemotherapy: Secondary | ICD-10-CM | POA: Diagnosis not present

## 2017-11-14 DIAGNOSIS — Z79899 Other long term (current) drug therapy: Secondary | ICD-10-CM | POA: Diagnosis not present

## 2017-11-14 DIAGNOSIS — C787 Secondary malignant neoplasm of liver and intrahepatic bile duct: Secondary | ICD-10-CM

## 2017-11-14 DIAGNOSIS — R5381 Other malaise: Secondary | ICD-10-CM | POA: Diagnosis not present

## 2017-11-14 LAB — CMP (CANCER CENTER ONLY)
ALT: 47 U/L (ref 10–47)
ANION GAP: 8 (ref 5–15)
AST: 71 U/L — AB (ref 11–38)
Albumin: 2.8 g/dL — ABNORMAL LOW (ref 3.5–5.0)
Alkaline Phosphatase: 492 U/L — ABNORMAL HIGH (ref 26–84)
BUN: 10 mg/dL (ref 7–22)
CHLORIDE: 104 mmol/L (ref 98–108)
CO2: 28 mmol/L (ref 18–33)
Calcium: 9.2 mg/dL (ref 8.0–10.3)
Creatinine: 0.7 mg/dL (ref 0.60–1.20)
GLUCOSE: 99 mg/dL (ref 73–118)
POTASSIUM: 3.9 mmol/L (ref 3.3–4.7)
Sodium: 140 mmol/L (ref 128–145)
Total Bilirubin: 1.1 mg/dL (ref 0.2–1.6)
Total Protein: 7.7 g/dL (ref 6.4–8.1)

## 2017-11-14 LAB — CBC WITH DIFFERENTIAL (CANCER CENTER ONLY)
BASOS ABS: 0 10*3/uL (ref 0.0–0.1)
Basophils Relative: 1 %
EOS PCT: 1 %
Eosinophils Absolute: 0.1 10*3/uL (ref 0.0–0.5)
HCT: 37.9 % — ABNORMAL LOW (ref 38.7–49.9)
Hemoglobin: 12 g/dL — ABNORMAL LOW (ref 13.0–17.1)
Lymphocytes Relative: 16 %
Lymphs Abs: 0.8 10*3/uL — ABNORMAL LOW (ref 0.9–3.3)
MCH: 28.6 pg (ref 28.0–33.4)
MCHC: 31.7 g/dL — AB (ref 32.0–35.9)
MCV: 90.2 fL (ref 82.0–98.0)
MONO ABS: 0.6 10*3/uL (ref 0.1–0.9)
MONOS PCT: 13 %
Neutro Abs: 3.5 10*3/uL (ref 1.5–6.5)
Neutrophils Relative %: 69 %
PLATELETS: 149 10*3/uL (ref 145–400)
RBC: 4.2 MIL/uL (ref 4.20–5.70)
RDW: 14.5 % (ref 11.1–15.7)
WBC Count: 5 10*3/uL (ref 4.0–10.0)

## 2017-11-14 NOTE — Progress Notes (Signed)
Hematology and Oncology Follow Up Visit  Ryan Maynard [id number] Jun 21, 1950 67 y.o. 11/14/2017   Principle Diagnosis:  Metastatic colon cancer - K-RAS wild-type - progressive  Current Therapy:   XELOX/Avastin s/p cycle 5 - stopped on 06/30/2015 XELIRI/Erbitux - s/p c#12  (28 day cycles) - omit the Irinotecan;  Status post RFA of liver metastases Clinical Trial at [facility] - Perjeta/Herceptin/Exp chemo XELOX/Avastin - s/p cycle #9 -oxaliplatin restarted on cycle 8.  D/C'ed on 09/30/2017 due to progression    Interim History:  Ryan Maynard is here today for follow-up.  He comes in with his wife.  I am absolutely ecstatic over how well he is doing right now.  I really am amazed as to his resilience.  He just looks so good.  His weight is actually up 7 pounds.  He is still fairly active.  Hospice is following him.  They come out every Monday.  He and his wife will plan and go to [employer] this weekend.  I am so happy that he is able to go.  He is not having pain.  Whenever he does have the occasional abdominal pain, he takes oxycodone which helps quite a bit.  He has had no bleeding.  There is no hemoptysis.  He has had no melena or hematochezia.  There is been no leg swelling.  He has had no rashes.  Overall, his performance status is ECOG 1.  . Medications: ( Allergies as of 11/14/2017      Reactions   Bactrim [sulfamethoxazole-trimethoprim] Diarrhea   Has taken 2 different times and both times frequent diarrhea   Ondansetron Hcl Other (See Comments)   Hiccups Hiccups Hiccups   Pregabalin Other (See Comments)   "thought I was going to die" "thought I was going to die" "thought I was going to die"      Medication List        Accurate as of 11/14/17 12:54 PM. Always use your most recent med list.          amLODipine-benazepril 10-40 MG capsule Commonly known as:  LOTREL Take 1 capsule by mouth daily.   aspirin 325 MG EC tablet Take 325 mg by mouth  daily as needed (Headaches).   Coenzyme Q10 10 MG capsule Take 10 mg by mouth daily.   diphenhydrAMINE 25 mg capsule Commonly known as:  BENADRYL Take 25 mg by mouth daily.   gabapentin 100 MG capsule Commonly known as:  NEURONTIN Take 2 capsules (200 mg total) by mouth 3 (three) times daily. Pt okay to take only once a day   ibuprofen 200 MG tablet Commonly known as:  ADVIL,MOTRIN Take 200-400 mg by mouth every 6 (six) hours as needed.   Lactulose 20 GM/30ML Soln Take 15 mLs (10 g total) by mouth every 6 (six) hours as needed.   lidocaine-prilocaine cream Commonly known as:  EMLA Apply 1 application topically as needed. Apply quarter sized amount to portacath site 1-2 hours prior to chemotherapy appt.  Cover with saran wrap.   loperamide 2 MG capsule Commonly known as:  IMODIUM Take 1 capsule (2 mg total) by mouth as needed for diarrhea or loose stools.   LORazepam 1 MG tablet Commonly known as:  ATIVAN Take 1 tablet (1 mg total) by mouth every 6 (six) hours as needed (NAUSEA).   oxyCODONE 5 MG immediate release tablet Commonly known as:  Oxy IR/ROXICODONE Take 1 tablet (5 mg total) by mouth every 6 (six) hours as needed for  severe pain.   oxymetazoline 0.05 % nasal spray Commonly known as:  AFRIN Place 1 spray into both nostrils 2 (two) times daily as needed for congestion.   pantoprazole 40 MG tablet Commonly known as:  PROTONIX Take 1 tablet (40 mg total) by mouth 2 (two) times daily.   traZODone 100 MG tablet Commonly known as:  DESYREL Take 1 tablet (100 mg total) by mouth at bedtime as needed for sleep.       Allergies:  Allergies  Allergen Reactions  . Bactrim [Sulfamethoxazole-Trimethoprim] Diarrhea    Has taken 2 different times and both times frequent diarrhea  . Ondansetron Hcl Other (See Comments)    Hiccups Hiccups Hiccups  . Pregabalin Other (See Comments)    "thought I was going to die" "thought I was going to die" "thought I was going to  die"    Past Medical History, Surgical history, Social history, and Family History were reviewed and updated.  Review of Systems: .Review of Systems  Constitutional: Positive for malaise/fatigue and weight loss.  HENT: Negative.   Eyes: Negative.   Respiratory: Negative.   Cardiovascular: Negative.   Gastrointestinal: Positive for constipation and nausea.  Genitourinary: Negative.   Musculoskeletal: Positive for back pain.  Skin: Negative.   Neurological: Negative.   Endo/Heme/Allergies: Negative.   Psychiatric/Behavioral: Negative.     Physical Exam:  weight is 167 lb (75.8 kg). His oral temperature is 98.2 F (36.8 C). His blood pressure is 112/65 and his pulse is 86. His respiration is 19 and oxygen saturation is 99%.   Wt Readings from Last 3 Encounters:  11/14/17 167 lb (75.8 kg)  10/24/17 160 lb (72.6 kg)  09/11/17 175 lb (79.4 kg)    Physical Exam  Constitutional: He is oriented to person, place, and time.  HENT:  Head: Normocephalic and atraumatic.  Mouth/Throat: Oropharynx is clear and moist.  Eyes: Pupils are equal, round, and reactive to light. EOM are normal.  Neck: Normal range of motion.  Cardiovascular: Normal rate, regular rhythm and normal heart sounds.  Pulmonary/Chest: Effort normal and breath sounds normal.  Abdominal: Soft. Bowel sounds are normal.  Musculoskeletal: Normal range of motion. He exhibits no edema, tenderness or deformity.  Lymphadenopathy:    He has no cervical adenopathy.  Neurological: He is alert and oriented to person, place, and time.  Skin: Skin is warm and dry. No rash noted. No erythema.  Psychiatric: He has a normal mood and affect. His behavior is normal. Judgment and thought content normal.  Vitals reviewed.    Lab Results  Component Value Date   WBC 5.0 11/14/2017   HGB 12.0 (L) 11/14/2017   HCT 37.9 (L) 11/14/2017   MCV 90.2 11/14/2017   PLT 149 11/14/2017   Lab Results  Component Value Date   FERRITIN 625  (H) 09/29/2017   IRON 48 09/29/2017   TIBC 221 09/29/2017   UIBC 173 09/29/2017   IRONPCTSAT 22 (L) 09/29/2017   Lab Results  Component Value Date   RBC 4.20 11/14/2017   No results found for: KPAFRELGTCHN, LAMBDASER, KAPLAMBRATIO No results found for: IGGSERUM, IGA, IGMSERUM No results found for: Ronnald Ramp, A1GS, A2GS, Violet Baldy, MSPIKE, SPEI   Chemistry      Component Value Date/Time   NA 140 11/14/2017 1144   NA 142 05/20/2017 1224   NA 140 12/26/2015 1013   K 3.9 11/14/2017 1144   K 3.6 05/20/2017 1224   K 4.1 12/26/2015 1013   CL 104 11/14/2017  1144   CL 104 05/20/2017 1224   CO2 28 11/14/2017 1144   CO2 27 05/20/2017 1224   CO2 24 12/26/2015 1013   BUN 10 11/14/2017 1144   BUN 10 05/20/2017 1224   BUN 16.8 12/26/2015 1013   CREATININE 0.70 11/14/2017 1144   CREATININE 1.0 05/20/2017 1224   CREATININE 0.8 12/26/2015 1013      Component Value Date/Time   CALCIUM 9.2 11/14/2017 1144   CALCIUM 9.3 05/20/2017 1224   CALCIUM 9.3 12/26/2015 1013   ALKPHOS 492 (H) 11/14/2017 1144   ALKPHOS 129 (H) 05/20/2017 1224   ALKPHOS 109 12/26/2015 1013   AST 71 (H) 11/14/2017 1144   AST 28 12/26/2015 1013   ALT 47 11/14/2017 1144   ALT 28 05/20/2017 1224   ALT 30 12/26/2015 1013   BILITOT 1.1 11/14/2017 1144   BILITOT 0.77 12/26/2015 1013     Impression and Plan: Ryan Maynard is 67 year old gentleman with metastatic colon cancer.   He really is doing nicely right now.  Hospice is doing a fantastic job with him.  We will get him back in another 4 weeks.  This will be a good test for him when he goes to the mountains.  If all goes well, that he and his wife will go to the beach in July.     Volanda Napoleon, MD 6/14/201912:54 PM

## 2017-11-19 DIAGNOSIS — Z452 Encounter for adjustment and management of vascular access device: Secondary | ICD-10-CM | POA: Diagnosis not present

## 2017-11-19 DIAGNOSIS — I10 Essential (primary) hypertension: Secondary | ICD-10-CM | POA: Diagnosis not present

## 2017-11-19 DIAGNOSIS — C787 Secondary malignant neoplasm of liver and intrahepatic bile duct: Secondary | ICD-10-CM | POA: Diagnosis not present

## 2017-11-19 DIAGNOSIS — C187 Malignant neoplasm of sigmoid colon: Secondary | ICD-10-CM | POA: Diagnosis not present

## 2017-11-24 DIAGNOSIS — C787 Secondary malignant neoplasm of liver and intrahepatic bile duct: Secondary | ICD-10-CM | POA: Diagnosis not present

## 2017-11-24 DIAGNOSIS — C187 Malignant neoplasm of sigmoid colon: Secondary | ICD-10-CM | POA: Diagnosis not present

## 2017-11-24 DIAGNOSIS — Z452 Encounter for adjustment and management of vascular access device: Secondary | ICD-10-CM | POA: Diagnosis not present

## 2017-11-24 DIAGNOSIS — I10 Essential (primary) hypertension: Secondary | ICD-10-CM | POA: Diagnosis not present

## 2017-12-01 DIAGNOSIS — C187 Malignant neoplasm of sigmoid colon: Secondary | ICD-10-CM | POA: Diagnosis not present

## 2017-12-01 DIAGNOSIS — C787 Secondary malignant neoplasm of liver and intrahepatic bile duct: Secondary | ICD-10-CM | POA: Diagnosis not present

## 2017-12-01 DIAGNOSIS — Z452 Encounter for adjustment and management of vascular access device: Secondary | ICD-10-CM | POA: Diagnosis not present

## 2017-12-01 DIAGNOSIS — I10 Essential (primary) hypertension: Secondary | ICD-10-CM | POA: Diagnosis not present

## 2017-12-02 DIAGNOSIS — C787 Secondary malignant neoplasm of liver and intrahepatic bile duct: Secondary | ICD-10-CM | POA: Diagnosis not present

## 2017-12-02 DIAGNOSIS — I10 Essential (primary) hypertension: Secondary | ICD-10-CM | POA: Diagnosis not present

## 2017-12-02 DIAGNOSIS — Z452 Encounter for adjustment and management of vascular access device: Secondary | ICD-10-CM | POA: Diagnosis not present

## 2017-12-02 DIAGNOSIS — C187 Malignant neoplasm of sigmoid colon: Secondary | ICD-10-CM | POA: Diagnosis not present

## 2017-12-08 DIAGNOSIS — Z452 Encounter for adjustment and management of vascular access device: Secondary | ICD-10-CM | POA: Diagnosis not present

## 2017-12-08 DIAGNOSIS — I10 Essential (primary) hypertension: Secondary | ICD-10-CM | POA: Diagnosis not present

## 2017-12-08 DIAGNOSIS — C187 Malignant neoplasm of sigmoid colon: Secondary | ICD-10-CM | POA: Diagnosis not present

## 2017-12-08 DIAGNOSIS — C787 Secondary malignant neoplasm of liver and intrahepatic bile duct: Secondary | ICD-10-CM | POA: Diagnosis not present

## 2017-12-15 DIAGNOSIS — C187 Malignant neoplasm of sigmoid colon: Secondary | ICD-10-CM | POA: Diagnosis not present

## 2017-12-15 DIAGNOSIS — C787 Secondary malignant neoplasm of liver and intrahepatic bile duct: Secondary | ICD-10-CM | POA: Diagnosis not present

## 2017-12-15 DIAGNOSIS — Z452 Encounter for adjustment and management of vascular access device: Secondary | ICD-10-CM | POA: Diagnosis not present

## 2017-12-15 DIAGNOSIS — I10 Essential (primary) hypertension: Secondary | ICD-10-CM | POA: Diagnosis not present

## 2017-12-17 ENCOUNTER — Inpatient Hospital Stay: Admitting: Hematology & Oncology

## 2017-12-17 ENCOUNTER — Inpatient Hospital Stay

## 2017-12-22 DIAGNOSIS — Z452 Encounter for adjustment and management of vascular access device: Secondary | ICD-10-CM | POA: Diagnosis not present

## 2017-12-22 DIAGNOSIS — I10 Essential (primary) hypertension: Secondary | ICD-10-CM | POA: Diagnosis not present

## 2017-12-22 DIAGNOSIS — C187 Malignant neoplasm of sigmoid colon: Secondary | ICD-10-CM | POA: Diagnosis not present

## 2017-12-22 DIAGNOSIS — C787 Secondary malignant neoplasm of liver and intrahepatic bile duct: Secondary | ICD-10-CM | POA: Diagnosis not present

## 2017-12-26 DIAGNOSIS — C787 Secondary malignant neoplasm of liver and intrahepatic bile duct: Secondary | ICD-10-CM | POA: Diagnosis not present

## 2017-12-26 DIAGNOSIS — Z452 Encounter for adjustment and management of vascular access device: Secondary | ICD-10-CM | POA: Diagnosis not present

## 2017-12-26 DIAGNOSIS — I10 Essential (primary) hypertension: Secondary | ICD-10-CM | POA: Diagnosis not present

## 2017-12-26 DIAGNOSIS — C187 Malignant neoplasm of sigmoid colon: Secondary | ICD-10-CM | POA: Diagnosis not present

## 2017-12-28 DIAGNOSIS — Z452 Encounter for adjustment and management of vascular access device: Secondary | ICD-10-CM | POA: Diagnosis not present

## 2017-12-28 DIAGNOSIS — C787 Secondary malignant neoplasm of liver and intrahepatic bile duct: Secondary | ICD-10-CM | POA: Diagnosis not present

## 2017-12-28 DIAGNOSIS — I10 Essential (primary) hypertension: Secondary | ICD-10-CM | POA: Diagnosis not present

## 2017-12-28 DIAGNOSIS — C187 Malignant neoplasm of sigmoid colon: Secondary | ICD-10-CM | POA: Diagnosis not present

## 2017-12-29 DIAGNOSIS — Z452 Encounter for adjustment and management of vascular access device: Secondary | ICD-10-CM | POA: Diagnosis not present

## 2017-12-29 DIAGNOSIS — C187 Malignant neoplasm of sigmoid colon: Secondary | ICD-10-CM | POA: Diagnosis not present

## 2017-12-29 DIAGNOSIS — C787 Secondary malignant neoplasm of liver and intrahepatic bile duct: Secondary | ICD-10-CM | POA: Diagnosis not present

## 2017-12-29 DIAGNOSIS — I10 Essential (primary) hypertension: Secondary | ICD-10-CM | POA: Diagnosis not present

## 2017-12-30 DIAGNOSIS — C787 Secondary malignant neoplasm of liver and intrahepatic bile duct: Secondary | ICD-10-CM | POA: Diagnosis not present

## 2017-12-30 DIAGNOSIS — I10 Essential (primary) hypertension: Secondary | ICD-10-CM | POA: Diagnosis not present

## 2017-12-30 DIAGNOSIS — C187 Malignant neoplasm of sigmoid colon: Secondary | ICD-10-CM | POA: Diagnosis not present

## 2017-12-30 DIAGNOSIS — Z452 Encounter for adjustment and management of vascular access device: Secondary | ICD-10-CM | POA: Diagnosis not present

## 2018-01-01 DIAGNOSIS — C787 Secondary malignant neoplasm of liver and intrahepatic bile duct: Secondary | ICD-10-CM | POA: Diagnosis not present

## 2018-01-01 DIAGNOSIS — I10 Essential (primary) hypertension: Secondary | ICD-10-CM | POA: Diagnosis not present

## 2018-01-01 DIAGNOSIS — C187 Malignant neoplasm of sigmoid colon: Secondary | ICD-10-CM | POA: Diagnosis not present

## 2018-01-01 DIAGNOSIS — Z452 Encounter for adjustment and management of vascular access device: Secondary | ICD-10-CM | POA: Diagnosis not present

## 2018-01-05 DIAGNOSIS — C787 Secondary malignant neoplasm of liver and intrahepatic bile duct: Secondary | ICD-10-CM | POA: Diagnosis not present

## 2018-01-05 DIAGNOSIS — Z452 Encounter for adjustment and management of vascular access device: Secondary | ICD-10-CM | POA: Diagnosis not present

## 2018-01-05 DIAGNOSIS — C187 Malignant neoplasm of sigmoid colon: Secondary | ICD-10-CM | POA: Diagnosis not present

## 2018-01-05 DIAGNOSIS — I10 Essential (primary) hypertension: Secondary | ICD-10-CM | POA: Diagnosis not present

## 2018-01-06 ENCOUNTER — Telehealth: Payer: Self-pay | Admitting: *Deleted

## 2018-01-06 ENCOUNTER — Other Ambulatory Visit (HOSPITAL_COMMUNITY): Payer: Self-pay | Admitting: Internal Medicine

## 2018-01-06 DIAGNOSIS — C187 Malignant neoplasm of sigmoid colon: Secondary | ICD-10-CM

## 2018-01-06 DIAGNOSIS — C787 Secondary malignant neoplasm of liver and intrahepatic bile duct: Secondary | ICD-10-CM

## 2018-01-06 NOTE — Telephone Encounter (Signed)
Received call from hospice RN, Juliann Pulse. Patient is experiencing ascites. She would like to know if we perform paracentesis here in the office. Explained that we do not, we refer patient's to [facility] to schedule. She stated that this is what they can do, she just wanted to make sure we didn't so them in the office, as the patient thought that we did.  They will place orders for paracentesis for the patient.

## 2018-01-08 ENCOUNTER — Ambulatory Visit (HOSPITAL_COMMUNITY)
Admission: RE | Admit: 2018-01-08 | Discharge: 2018-01-08 | Disposition: A | Discharge status: Home / Self Care | Source: Ambulatory Visit | Attending: Internal Medicine | Admitting: Internal Medicine

## 2018-01-08 DIAGNOSIS — C787 Secondary malignant neoplasm of liver and intrahepatic bile duct: Secondary | ICD-10-CM | POA: Insufficient documentation

## 2018-01-08 DIAGNOSIS — C189 Malignant neoplasm of colon, unspecified: Secondary | ICD-10-CM | POA: Insufficient documentation

## 2018-01-08 DIAGNOSIS — C187 Malignant neoplasm of sigmoid colon: Secondary | ICD-10-CM

## 2018-01-08 DIAGNOSIS — R188 Other ascites: Secondary | ICD-10-CM | POA: Diagnosis not present

## 2018-01-08 MED ORDER — LIDOCAINE HCL 1 % IJ SOLN
INTRAMUSCULAR | Status: AC
  Filled 2018-01-08: qty 20

## 2018-01-08 NOTE — Procedures (Signed)
Ultrasound-guided  therapeutic paracentesis performed yielding 6.1 liters of slightly hazy, yellow fluid. No immediate complications.

## 2018-01-09 DIAGNOSIS — Z452 Encounter for adjustment and management of vascular access device: Secondary | ICD-10-CM | POA: Diagnosis not present

## 2018-01-09 DIAGNOSIS — I10 Essential (primary) hypertension: Secondary | ICD-10-CM | POA: Diagnosis not present

## 2018-01-09 DIAGNOSIS — C787 Secondary malignant neoplasm of liver and intrahepatic bile duct: Secondary | ICD-10-CM | POA: Diagnosis not present

## 2018-01-09 DIAGNOSIS — C187 Malignant neoplasm of sigmoid colon: Secondary | ICD-10-CM | POA: Diagnosis not present

## 2018-01-12 DIAGNOSIS — Z452 Encounter for adjustment and management of vascular access device: Secondary | ICD-10-CM | POA: Diagnosis not present

## 2018-01-12 DIAGNOSIS — C787 Secondary malignant neoplasm of liver and intrahepatic bile duct: Secondary | ICD-10-CM | POA: Diagnosis not present

## 2018-01-12 DIAGNOSIS — C187 Malignant neoplasm of sigmoid colon: Secondary | ICD-10-CM | POA: Diagnosis not present

## 2018-01-12 DIAGNOSIS — I10 Essential (primary) hypertension: Secondary | ICD-10-CM | POA: Diagnosis not present

## 2018-01-15 DIAGNOSIS — I10 Essential (primary) hypertension: Secondary | ICD-10-CM | POA: Diagnosis not present

## 2018-01-15 DIAGNOSIS — Z452 Encounter for adjustment and management of vascular access device: Secondary | ICD-10-CM | POA: Diagnosis not present

## 2018-01-15 DIAGNOSIS — C787 Secondary malignant neoplasm of liver and intrahepatic bile duct: Secondary | ICD-10-CM | POA: Diagnosis not present

## 2018-01-15 DIAGNOSIS — C187 Malignant neoplasm of sigmoid colon: Secondary | ICD-10-CM | POA: Diagnosis not present

## 2018-01-19 ENCOUNTER — Other Ambulatory Visit (HOSPITAL_COMMUNITY): Payer: Self-pay | Admitting: Internal Medicine

## 2018-01-19 DIAGNOSIS — R188 Other ascites: Secondary | ICD-10-CM

## 2018-01-19 DIAGNOSIS — I10 Essential (primary) hypertension: Secondary | ICD-10-CM | POA: Diagnosis not present

## 2018-01-19 DIAGNOSIS — C187 Malignant neoplasm of sigmoid colon: Secondary | ICD-10-CM | POA: Diagnosis not present

## 2018-01-19 DIAGNOSIS — Z452 Encounter for adjustment and management of vascular access device: Secondary | ICD-10-CM | POA: Diagnosis not present

## 2018-01-19 DIAGNOSIS — C787 Secondary malignant neoplasm of liver and intrahepatic bile duct: Secondary | ICD-10-CM | POA: Diagnosis not present

## 2018-01-21 ENCOUNTER — Other Ambulatory Visit (HOSPITAL_COMMUNITY): Payer: Self-pay | Admitting: Intensive Care

## 2018-01-21 ENCOUNTER — Ambulatory Visit (HOSPITAL_COMMUNITY)
Admission: RE | Admit: 2018-01-21 | Discharge: 2018-01-21 | Disposition: A | Discharge status: Home / Self Care | Source: Ambulatory Visit | Attending: Internal Medicine | Admitting: Internal Medicine

## 2018-01-21 DIAGNOSIS — R188 Other ascites: Secondary | ICD-10-CM | POA: Insufficient documentation

## 2018-01-21 DIAGNOSIS — R180 Malignant ascites: Secondary | ICD-10-CM

## 2018-01-21 DIAGNOSIS — C189 Malignant neoplasm of colon, unspecified: Secondary | ICD-10-CM | POA: Insufficient documentation

## 2018-01-21 DIAGNOSIS — C787 Secondary malignant neoplasm of liver and intrahepatic bile duct: Secondary | ICD-10-CM

## 2018-01-21 MED ORDER — LIDOCAINE HCL 1 % IJ SOLN
INTRAMUSCULAR | Status: AC
  Filled 2018-01-21: qty 20

## 2018-01-21 NOTE — Procedures (Signed)
Ultrasound-guided  therapeutic paracentesis performed yielding 5.9 liters of slightly hazy, yellow  fluid. No immediate complications.

## 2018-01-26 ENCOUNTER — Other Ambulatory Visit (HOSPITAL_COMMUNITY): Payer: Self-pay | Admitting: Gerontology

## 2018-01-26 DIAGNOSIS — Z452 Encounter for adjustment and management of vascular access device: Secondary | ICD-10-CM | POA: Diagnosis not present

## 2018-01-26 DIAGNOSIS — C187 Malignant neoplasm of sigmoid colon: Secondary | ICD-10-CM | POA: Diagnosis not present

## 2018-01-26 DIAGNOSIS — C787 Secondary malignant neoplasm of liver and intrahepatic bile duct: Secondary | ICD-10-CM | POA: Diagnosis not present

## 2018-01-26 DIAGNOSIS — R0602 Shortness of breath: Secondary | ICD-10-CM

## 2018-01-26 DIAGNOSIS — R1084 Generalized abdominal pain: Secondary | ICD-10-CM

## 2018-01-26 DIAGNOSIS — R198 Other specified symptoms and signs involving the digestive system and abdomen: Secondary | ICD-10-CM

## 2018-01-26 DIAGNOSIS — I10 Essential (primary) hypertension: Secondary | ICD-10-CM | POA: Diagnosis not present

## 2018-01-29 ENCOUNTER — Ambulatory Visit (HOSPITAL_COMMUNITY)
Admission: RE | Admit: 2018-01-29 | Discharge: 2018-01-29 | Disposition: A | Discharge status: Home / Self Care | Source: Ambulatory Visit | Attending: Gerontology | Admitting: Gerontology

## 2018-01-29 DIAGNOSIS — R188 Other ascites: Secondary | ICD-10-CM | POA: Diagnosis not present

## 2018-01-29 DIAGNOSIS — C787 Secondary malignant neoplasm of liver and intrahepatic bile duct: Secondary | ICD-10-CM | POA: Diagnosis not present

## 2018-01-29 DIAGNOSIS — C801 Malignant (primary) neoplasm, unspecified: Secondary | ICD-10-CM | POA: Diagnosis not present

## 2018-01-29 DIAGNOSIS — R0602 Shortness of breath: Secondary | ICD-10-CM

## 2018-01-29 DIAGNOSIS — R198 Other specified symptoms and signs involving the digestive system and abdomen: Secondary | ICD-10-CM

## 2018-01-29 DIAGNOSIS — R1084 Generalized abdominal pain: Secondary | ICD-10-CM

## 2018-01-29 MED ORDER — LIDOCAINE HCL 1 % IJ SOLN
INTRAMUSCULAR | Status: AC
  Filled 2018-01-29: qty 20

## 2018-01-29 NOTE — Procedures (Signed)
PROCEDURE SUMMARY:  Successful image-guided paracentesis from the left lower abdomen.  Yielded 4.9 liters of clear yellow fluid.  No immediate complications.  Patient tolerated well.   Specimen was not sent for labs.  Claris Pong Leiana Rund PA-C 01/29/2018 2:40 PM

## 2018-02-01 DIAGNOSIS — C787 Secondary malignant neoplasm of liver and intrahepatic bile duct: Secondary | ICD-10-CM | POA: Diagnosis not present

## 2018-02-01 DIAGNOSIS — I10 Essential (primary) hypertension: Secondary | ICD-10-CM | POA: Diagnosis not present

## 2018-02-01 DIAGNOSIS — C187 Malignant neoplasm of sigmoid colon: Secondary | ICD-10-CM | POA: Diagnosis not present

## 2018-02-01 DIAGNOSIS — Z438 Encounter for attention to other artificial openings: Secondary | ICD-10-CM | POA: Diagnosis not present

## 2018-02-01 DIAGNOSIS — Z452 Encounter for adjustment and management of vascular access device: Secondary | ICD-10-CM | POA: Diagnosis not present

## 2018-02-02 DIAGNOSIS — C187 Malignant neoplasm of sigmoid colon: Secondary | ICD-10-CM | POA: Diagnosis not present

## 2018-02-02 DIAGNOSIS — I10 Essential (primary) hypertension: Secondary | ICD-10-CM | POA: Diagnosis not present

## 2018-02-02 DIAGNOSIS — C787 Secondary malignant neoplasm of liver and intrahepatic bile duct: Secondary | ICD-10-CM | POA: Diagnosis not present

## 2018-02-02 DIAGNOSIS — Z452 Encounter for adjustment and management of vascular access device: Secondary | ICD-10-CM | POA: Diagnosis not present

## 2018-02-02 DIAGNOSIS — Z438 Encounter for attention to other artificial openings: Secondary | ICD-10-CM | POA: Diagnosis not present

## 2018-02-03 ENCOUNTER — Other Ambulatory Visit (HOSPITAL_COMMUNITY): Payer: Self-pay | Admitting: Gerontology

## 2018-02-03 DIAGNOSIS — C787 Secondary malignant neoplasm of liver and intrahepatic bile duct: Secondary | ICD-10-CM

## 2018-02-04 DIAGNOSIS — I10 Essential (primary) hypertension: Secondary | ICD-10-CM | POA: Diagnosis not present

## 2018-02-04 DIAGNOSIS — C187 Malignant neoplasm of sigmoid colon: Secondary | ICD-10-CM | POA: Diagnosis not present

## 2018-02-04 DIAGNOSIS — Z438 Encounter for attention to other artificial openings: Secondary | ICD-10-CM | POA: Diagnosis not present

## 2018-02-04 DIAGNOSIS — Z452 Encounter for adjustment and management of vascular access device: Secondary | ICD-10-CM | POA: Diagnosis not present

## 2018-02-04 DIAGNOSIS — C787 Secondary malignant neoplasm of liver and intrahepatic bile duct: Secondary | ICD-10-CM | POA: Diagnosis not present

## 2018-02-05 ENCOUNTER — Ambulatory Visit (HOSPITAL_COMMUNITY)
Admission: RE | Admit: 2018-02-05 | Discharge: 2018-02-05 | Disposition: A | Discharge status: Home / Self Care | Source: Ambulatory Visit | Attending: Gerontology | Admitting: Gerontology

## 2018-02-05 ENCOUNTER — Other Ambulatory Visit: Payer: Self-pay | Admitting: Radiology

## 2018-02-05 ENCOUNTER — Encounter (HOSPITAL_COMMUNITY): Payer: Self-pay | Admitting: Radiology

## 2018-02-05 DIAGNOSIS — R188 Other ascites: Secondary | ICD-10-CM | POA: Diagnosis not present

## 2018-02-05 DIAGNOSIS — C187 Malignant neoplasm of sigmoid colon: Secondary | ICD-10-CM | POA: Diagnosis not present

## 2018-02-05 DIAGNOSIS — R180 Malignant ascites: Secondary | ICD-10-CM | POA: Insufficient documentation

## 2018-02-05 DIAGNOSIS — C787 Secondary malignant neoplasm of liver and intrahepatic bile duct: Secondary | ICD-10-CM | POA: Insufficient documentation

## 2018-02-05 HISTORY — PX: IR PARACENTESIS: IMG2679

## 2018-02-05 MED ORDER — LIDOCAINE HCL (PF) 2 % IJ SOLN
INTRAMUSCULAR | Status: DC | PRN
  Administered 2018-02-05: 10 mL

## 2018-02-05 MED ORDER — LIDOCAINE HCL (PF) 2 % IJ SOLN
INTRAMUSCULAR | Status: AC
  Filled 2018-02-05: qty 20

## 2018-02-05 NOTE — Procedures (Signed)
Ultrasound-guided  therapeutic paracentesis performed yielding 4.8 liters of slightly hazy, yellow  fluid. No immediate complications.

## 2018-02-06 DIAGNOSIS — C787 Secondary malignant neoplasm of liver and intrahepatic bile duct: Secondary | ICD-10-CM | POA: Diagnosis not present

## 2018-02-06 DIAGNOSIS — C187 Malignant neoplasm of sigmoid colon: Secondary | ICD-10-CM | POA: Diagnosis not present

## 2018-02-06 DIAGNOSIS — Z452 Encounter for adjustment and management of vascular access device: Secondary | ICD-10-CM | POA: Diagnosis not present

## 2018-02-06 DIAGNOSIS — Z438 Encounter for attention to other artificial openings: Secondary | ICD-10-CM | POA: Diagnosis not present

## 2018-02-06 DIAGNOSIS — I10 Essential (primary) hypertension: Secondary | ICD-10-CM | POA: Diagnosis not present

## 2018-02-09 ENCOUNTER — Encounter (HOSPITAL_COMMUNITY): Payer: Self-pay

## 2018-02-09 ENCOUNTER — Ambulatory Visit (HOSPITAL_COMMUNITY)
Admission: RE | Admit: 2018-02-09 | Discharge: 2018-02-09 | Disposition: A | Discharge status: Home / Self Care | Source: Ambulatory Visit | Attending: Geriatric Medicine | Admitting: Geriatric Medicine

## 2018-02-09 ENCOUNTER — Other Ambulatory Visit: Payer: Self-pay

## 2018-02-09 DIAGNOSIS — Z7982 Long term (current) use of aspirin: Secondary | ICD-10-CM | POA: Insufficient documentation

## 2018-02-09 DIAGNOSIS — Z438 Encounter for attention to other artificial openings: Secondary | ICD-10-CM | POA: Diagnosis not present

## 2018-02-09 DIAGNOSIS — C189 Malignant neoplasm of colon, unspecified: Secondary | ICD-10-CM | POA: Insufficient documentation

## 2018-02-09 DIAGNOSIS — R180 Malignant ascites: Secondary | ICD-10-CM | POA: Insufficient documentation

## 2018-02-09 DIAGNOSIS — I10 Essential (primary) hypertension: Secondary | ICD-10-CM | POA: Insufficient documentation

## 2018-02-09 DIAGNOSIS — Z882 Allergy status to sulfonamides status: Secondary | ICD-10-CM | POA: Diagnosis not present

## 2018-02-09 DIAGNOSIS — C187 Malignant neoplasm of sigmoid colon: Secondary | ICD-10-CM | POA: Diagnosis not present

## 2018-02-09 DIAGNOSIS — Z452 Encounter for adjustment and management of vascular access device: Secondary | ICD-10-CM | POA: Diagnosis not present

## 2018-02-09 DIAGNOSIS — C787 Secondary malignant neoplasm of liver and intrahepatic bile duct: Secondary | ICD-10-CM | POA: Insufficient documentation

## 2018-02-09 HISTORY — PX: IR PERC TUN PERIT CATH W/PORT S&I /IMAG: IMG2328

## 2018-02-09 LAB — BASIC METABOLIC PANEL
Anion gap: 9 (ref 5–15)
BUN: 14 mg/dL (ref 8–23)
CALCIUM: 8.4 mg/dL — AB (ref 8.9–10.3)
CO2: 26 mmol/L (ref 22–32)
CREATININE: 0.89 mg/dL (ref 0.61–1.24)
Chloride: 98 mmol/L (ref 98–111)
GFR calc non Af Amer: >60 mL/min (ref >60)
Glucose, Bld: 93 mg/dL (ref 70–99)
Potassium: 4 mmol/L (ref 3.5–5.1)
SODIUM: 133 mmol/L — AB (ref 135–145)

## 2018-02-09 LAB — CBC
HCT: 40.3 % (ref 39.0–52.0)
Hemoglobin: 12.6 g/dL — ABNORMAL LOW (ref 13.0–17.0)
MCH: 26.4 pg (ref 26.0–34.0)
MCHC: 31.3 g/dL (ref 30.0–36.0)
MCV: 84.5 fL (ref 78.0–100.0)
Platelets: 146 10*3/uL — ABNORMAL LOW (ref 150–400)
RBC: 4.77 MIL/uL (ref 4.22–5.81)
RDW: 18.2 % — ABNORMAL HIGH (ref 11.5–15.5)
WBC: 6.1 10*3/uL (ref 4.0–10.5)

## 2018-02-09 LAB — PROTIME-INR
INR: 1.15
Prothrombin Time: 14.6 seconds (ref 11.4–15.2)

## 2018-02-09 MED ORDER — MIDAZOLAM HCL 2 MG/2ML IJ SOLN
INTRAMUSCULAR | Status: AC
  Filled 2018-02-09: qty 6

## 2018-02-09 MED ORDER — LIDOCAINE HCL (PF) 1 % IJ SOLN
INTRAMUSCULAR | Status: AC | PRN
  Administered 2018-02-09: 10 mL

## 2018-02-09 MED ORDER — FENTANYL CITRATE (PF) 100 MCG/2ML IJ SOLN
INTRAMUSCULAR | Status: AC
  Filled 2018-02-09: qty 4

## 2018-02-09 MED ORDER — HYDROCODONE-ACETAMINOPHEN 5-325 MG PO TABS: 1.0000 | ORAL_TABLET | ORAL | Status: DC | PRN

## 2018-02-09 MED ORDER — FENTANYL CITRATE (PF) 100 MCG/2ML IJ SOLN
INTRAMUSCULAR | Status: AC | PRN
  Administered 2018-02-09 (×3): 50 ug via INTRAVENOUS

## 2018-02-09 MED ORDER — CEFAZOLIN SODIUM-DEXTROSE 2-4 GM/100ML-% IV SOLN
2.0000 g | INTRAVENOUS | Status: AC
  Administered 2018-02-09: 2 g via INTRAVENOUS

## 2018-02-09 MED ORDER — SODIUM CHLORIDE 0.9 % IV SOLN: INTRAVENOUS | Status: DC

## 2018-02-09 MED ORDER — CEFAZOLIN SODIUM-DEXTROSE 2-4 GM/100ML-% IV SOLN
INTRAVENOUS | Status: AC
  Filled 2018-02-09: qty 100

## 2018-02-09 MED ORDER — LIDOCAINE HCL 1 % IJ SOLN
INTRAMUSCULAR | Status: AC
  Filled 2018-02-09: qty 20

## 2018-02-09 MED ORDER — MIDAZOLAM HCL 2 MG/2ML IJ SOLN
INTRAMUSCULAR | Status: AC | PRN
  Administered 2018-02-09 (×3): 1 mg via INTRAVENOUS

## 2018-02-09 NOTE — H&P (Signed)
Chief Complaint: Patient was seen in consultation today for peritoneal pleurx catheter placement at the request of Wroblewski,Genevieve  Referring Physician(s): Wroblewski,Genevieve  Supervising Physician: Arne Cleveland  Patient Status: [facility] - Out-pt  History of Present Illness: Ryan Maynard is a 67 y.o. male   Colon Ca Liver lesion- metastatic colorectal adenocarcinoma per Bx 09/11/2017 Recurrent ascites Requiring large volume paracentesis every 10 days or so Has had 4 paras since 8/8--- all 4-5 L  Hospice MD has requested Tunneled peritoneal Pleurx catheter placement Last para 02/05/18: 4.8L Pt does feel he is reaccumulating rapidly   Past Medical History:  Diagnosis Date  . Colon cancer metastasized to liver ([place]) 06/15/2013  . Counseling regarding goals of care 11/07/2016  . History of chemotherapy jan 2016  . Hypertension   . Nasal congestion   . PONV (postoperative nausea and vomiting) age 4    Past Surgical History:  Procedure Laterality Date  . IR GENERIC HISTORICAL  07/14/2014   IR RADIOLOGIST EVAL & MGMT 07/14/2014 Markus Daft, MD [facility] INTERV RAD  . IR GENERIC HISTORICAL  08/11/2014   IR RADIOLOGIST EVAL & MGMT 08/11/2014 Markus Daft, MD [facility] INTERV RAD  . IR PARACENTESIS  02/05/2018  . port a cath insertion  14 months ago   right chest  . right hip growth removed     . skin grafts  age 80    Allergies: Bactrim [sulfamethoxazole-trimethoprim]; Pregabalin; and Ondansetron hcl  Medications: Prior to Admission medications   Medication Sig Start Date End Date Taking? Authorizing Provider  amLODipine-benazepril (LOTREL) 5-20 MG capsule Take 1 capsule by mouth daily.   Yes [provider]  bisacodyl (DULCOLAX) 5 MG EC tablet Take 5 mg by mouth 2 (two) times daily as needed for moderate constipation.   Yes [provider]  methadone (DOLOPHINE) 5 MG tablet Take 5 mg by mouth at bedtime.   Yes [provider]  metoCLOPramide (REGLAN) 5  MG tablet Take 5 mg by mouth every 8 (eight) hours as needed for nausea.   Yes [provider]  oxyCODONE (OXY IR/ROXICODONE) 5 MG immediate release tablet Take 1 tablet (5 mg total) by mouth every 6 (six) hours as needed for severe pain. 09/30/17  Yes Ennever, Rudell Cobb, MD  oxymetazoline (AFRIN) 0.05 % nasal spray Place 1 spray into both nostrils 2 (two) times daily as needed for congestion.   Yes [provider]  pantoprazole (PROTONIX) 40 MG tablet Take 1 tablet (40 mg total) by mouth 2 (two) times daily. 10/13/17  Yes Ennever, Rudell Cobb, MD  sucralfate (CARAFATE) 1 GM/10ML suspension Take 1 g by mouth 2 (two) times daily.   Yes [provider]  traZODone (DESYREL) 100 MG tablet Take 1 tablet (100 mg total) by mouth at bedtime as needed for sleep. Patient taking differently: Take 100 mg by mouth at bedtime.  07/01/17  Yes Volanda Napoleon, MD  aspirin 325 MG EC tablet Take 325 mg by mouth daily as needed (Headaches).    [provider]  diphenhydrAMINE (BENADRYL) 25 mg capsule Take 25 mg by mouth daily as needed for allergies.     [provider]  ibuprofen (ADVIL,MOTRIN) 200 MG tablet Take 200-400 mg by mouth every 6 (six) hours as needed (pain).     [provider]  Lactulose 20 GM/30ML SOLN Take 15 mLs (10 g total) by mouth every 6 (six) hours as needed. 07/01/17   Volanda Napoleon, MD  loperamide (IMODIUM) 2 MG capsule Take  1 capsule (2 mg total) by mouth as needed for diarrhea or loose stools. 01/14/17   Volanda Napoleon, MD  LORazepam (ATIVAN) 1 MG tablet Take 1 tablet (1 mg total) by mouth every 6 (six) hours as needed (NAUSEA). 01/04/16   Volanda Napoleon, MD     History reviewed. No pertinent family history.  Social History   Socioeconomic History  . Marital status: Single    Spouse name: Not on file  . Number of children: Not on file  . Years of education: Not on file  . Highest education level: Not on file  Occupational History  .  Not on file  Social Needs  . Financial resource strain: Not on file  . Food insecurity:    Worry: Not on file    Inability: Not on file  . Transportation needs:    Medical: Not on file    Non-medical: Not on file  Tobacco Use  . Smoking status: Never Smoker  . Smokeless tobacco: Never Used  . Tobacco comment: never used tobacco  Substance and Sexual Activity  . Alcohol use: Yes    Alcohol/week: 0.0 standard drinks    Comment: occasional wine  . Drug use: No  . Sexual activity: Not on file  Lifestyle  . Physical activity:    Days per week: Not on file    Minutes per session: Not on file  . Stress: Not on file  Relationships  . Social connections:    Talks on phone: Not on file    Gets together: Not on file    Attends religious service: Not on file    Active member of club or organization: Not on file    Attends meetings of clubs or organizations: Not on file    Relationship status: Not on file  Other Topics Concern  . Not on file  Social History Narrative  . Not on file    Review of Systems: A 12 point ROS discussed and pertinent positives are indicated in the HPI above.  All other systems are negative.  Review of Systems  Constitutional: Positive for fatigue and unexpected weight change.  Respiratory: Positive for shortness of breath.   Cardiovascular: Negative for chest pain.  Gastrointestinal: Positive for abdominal distention and nausea.  Neurological: Positive for weakness.  Psychiatric/Behavioral: Negative for behavioral problems and confusion.    Vital Signs: BP 120/74 (BP Location: Right Arm)   Pulse 93   Temp 98.2 F (36.8 C) (Oral)   Ht 5\' 5"  (1.651 m)   Wt 156 lb (70.8 kg)   SpO2 99%   BMI 25.96 kg/m   Physical Exam  Constitutional: He is oriented to person, place, and time.  Cardiovascular: Normal rate and regular rhythm.  Pulmonary/Chest: Effort normal and breath sounds normal. He has no wheezes.  Abdominal: Soft. Bowel sounds are normal. He  exhibits distension.  Musculoskeletal: Normal range of motion.  Neurological: He is alert and oriented to person, place, and time.  Skin: Skin is warm and dry.  Psychiatric: He has a normal mood and affect. His behavior is normal. Judgment and thought content normal.  Nursing note and vitals reviewed.   Imaging: US Paracentesis  Result Date: 01/29/2018 INDICATION: Patient with history of metastatic colon cancer and recurrent ascites. Request is made for therapeutic paracentesis. EXAM: ULTRASOUND GUIDED THERAPEUTIC PARACENTESIS MEDICATIONS: 10 mL 1% lidocaine COMPLICATIONS: None immediate. PROCEDURE: Informed written consent was obtained from the patient after a discussion of the risks, benefits and alternatives to treatment.  A timeout was performed prior to the initiation of the procedure. Initial ultrasound scanning demonstrates a large amount of ascites within the left lower abdominal quadrant. The left lower abdomen was prepped and draped in the usual sterile fashion. 1% lidocaine was used for local anesthesia. Following this, a 19 gauge, 7-cm, Yueh catheter was introduced. An ultrasound image was saved for documentation purposes. The paracentesis was performed. The catheter was removed and a dressing was applied. The patient tolerated the procedure well without immediate post procedural complication. FINDINGS: A total of approximately 4.9 L of clear yellow fluid was removed. IMPRESSION: Successful ultrasound-guided paracentesis yielding 4.9 L of peritoneal fluid. Read by: Earley Abide, PA-C Electronically Signed   By: Marybelle Killings M.D.   On: 01/29/2018 15:04   US Paracentesis  Result Date: 01/21/2018 INDICATION: Patient with history of metastatic colon cancer, recurrent ascites. Request made for therapeutic paracentesis. EXAM: ULTRASOUND GUIDED THERAPEUTIC PARACENTESIS MEDICATIONS: None COMPLICATIONS: None immediate. PROCEDURE: Informed written consent was obtained from the patient after a  discussion of the risks, benefits and alternatives to treatment. A timeout was performed prior to the initiation of the procedure. Initial ultrasound scanning demonstrates a large amount of ascites within the right lower abdominal quadrant. The right lower abdomen was prepped and draped in the usual sterile fashion. 1% lidocaine was used for local anesthesia. Following this, a 19 gauge, 7-cm, Yueh catheter was introduced. An ultrasound image was saved for documentation purposes. The paracentesis was performed. The catheter was removed and a dressing was applied. The patient tolerated the procedure well without immediate post procedural complication. FINDINGS: A total of approximately 5.9 liters of slightly hazy, yellow fluid was removed. IMPRESSION: Successful ultrasound-guided therapeutic paracentesis yielding 5.9 liters of peritoneal fluid. Read by: Rowe Robert, PA-C Electronically Signed   By: Jerilynn Mages.  Shick M.D.   On: 01/21/2018 12:13   Ir Paracentesis  Result Date: 02/05/2018 INDICATION: Metastatic colon cancer, recurrent ascites. Request made for therapeutic paracentesis. EXAM: ULTRASOUND GUIDED THERAPEUTIC PARACENTESIS MEDICATIONS: None COMPLICATIONS: None immediate. PROCEDURE: Informed written consent was obtained from the patient after a discussion of the risks, benefits and alternatives to treatment. A timeout was performed prior to the initiation of the procedure. Initial ultrasound scanning demonstrates a moderate to large amount of ascites within the right lower abdominal quadrant. The right lower abdomen was prepped and draped in the usual sterile fashion. 2% lidocaine was used for local anesthesia. Following this, a 19 gauge, 7-cm, Yueh catheter was introduced. An ultrasound image was saved for documentation purposes. The paracentesis was performed. The catheter was removed and a dressing was applied. The patient tolerated the procedure well without immediate post procedural complication. FINDINGS: A  total of approximately 4.8 liters of slightly hazy, yellow fluid was removed. IMPRESSION: Successful ultrasound-guided therapeutic paracentesis yielding 4.8 liters of peritoneal fluid. Read by: Rowe Robert, PA-C Electronically Signed   By: Markus Daft M.D.   On: 02/05/2018 09:42    Labs:  CBC: Recent Labs    09/29/17 1104 10/24/17 1119 11/14/17 1144 02/09/18 0809  WBC 3.1* 4.8 5.0 6.1  HGB 15.7 11.1* 12.0* 12.6*  HCT 47.0 34.0* 37.9* 40.3  PLT 69* 182 149 146*    COAGS: Recent Labs    09/11/17 0710 02/09/18 0809  INR 1.03 1.15    BMP: Recent Labs    06/10/17 0755  09/01/17 0959 09/29/17 1104 10/24/17 1119 11/14/17 1144  NA 139   < > 140 139 141 140  K 3.4*   < > 3.8 3.6  3.7 3.9  CL 101   < > 105 105 105 104  CO2 27   < > 28 31 28 28   GLUCOSE 146*   < > 107 133* 148* 99  BUN 8   < > 9 8 10 10   CALCIUM 9.2   < > 9.2 9.3 9.4 9.2  CREATININE 0.90   < > 0.84 0.80 0.80 0.70  GFRNONAA >60  --  >60  --   --   --   GFRAA >60  --  >60  --   --   --    < > = values in this interval not displayed.    LIVER FUNCTION TESTS: Recent Labs    09/01/17 0959 09/29/17 1104 10/24/17 1119 11/14/17 1144  BILITOT 1.0 1.5 1.0 1.1  AST 71* 96* 59* 71*  ALT 48 63* 39 47  ALKPHOS 348* 385* 415* 492*  PROT 7.1 7.2 6.9 7.7  ALBUMIN 3.0* 3.1* 2.9* 2.8*    TUMOR MARKERS: No results for input(s): AFPTM, CEA, CA199, CHROMGRNA in the last 8760 hours.  Assessment and Plan:  Colon Ca Liver lesion: + metastatic colorectal adenocarcinoma Recurrent ascites 4 large volume paracentesis since 01/08/18-- all 4-5 L Hospice MD asking for Peritoneal PleurX catheter placement Scheduled now for same Pt and wife are aware of procedure benefits and risks Including but not limited to Infection; bleeding; damage to surrounding structures Agreeable to proceed Consent signed andin chart  Thank you for this interesting consult.  I greatly enjoyed meeting Ryan Maynard and look forward to  participating in their care.  A copy of this report was sent to the requesting provider on this date.  Electronically Signed: Lavonia Drafts, PA-C 02/09/2018, 9:07 AM   I spent a total of    40 Minutes in face to face in clinical consultation, greater than 50% of which was counseling/coordinating care for peritoneal pleurx catheter placement

## 2018-02-09 NOTE — Discharge Instructions (Signed)
Indwelling Pleural Catheter Home Guide  An indwelling pleural catheter is a thin, flexible tube that is inserted under your skin and into your chest. The catheter drains excess fluid that collects in the area between the chest wall and the lungs (pleural space).After the catheter is inserted, it can be attached to a bottle that collects fluid.  The pleural catheter will allow you to drain fluid from your chest at home on a regular basis (sometimes daily). This will eliminate the need for frequent visits to the hospital or clinic to drain the fluid. The catheter may be removed after the excess fluid problem is resolved, usually after 2-3 months. It is important to follow instructions from your health care provider about how to drain and care for your catheter.  What are the risks?  Generally, this is a safe procedure. However, problems may occur, including:  · Infection.  · Skin damage around the catheter.  · Lung damage.  · Failure of the chest tube to work properly.  · Spreading of cancer cells along the catheter, if you have cancer.    Supplies needed:  · Vacuum-sealed drainage bottle with attached drainage line.  · Sterile dressing.  · Sterile alcohol pads.  · Sterile gloves.  · Valve cap.  · Sterile gauze pads, 4 × 4 inch (10 cm × 10 cm).  · Tape.  · Adhesive dressing.  · Sterile foam catheter pad.  How to care for your catheter and insertion site  · Wash your hands with soap and warm water before and after touching the catheter or insertion site. If soap and water are not available, use hand sanitizer.  · Check your bandage (dressing) daily to make sure it is clean and dry.  · Keep the skin around the catheter clean and dry.  · Check the catheter regularly for any cracks or kinks in the tubing.  · Check your catheter insertion site every day for signs of infection. Check for:  ? Skin breakdown.  ? Redness, swelling, or pain.  ? Fluid or blood.  ? Warmth.  ? Pus or a bad smell.  How to drain your catheter  You  may need to drain your catheter every day, or more or less often as told by your health care provider. Follow instructions from your health care provider about how to drain your catheter. You may also refer to instructions that come with the drainage system. To drain the catheter:  1. Wash your hands with soap and warm water. If soap and water are not available, use hand sanitizer.  2. Carefully remove the dressing from around the catheter.  3. Wash your hands again.  4. Put on the gloves provided.  5. Prepare the vacuum-sealed drainage bottle and drainage line. Close the drainage line of the vacuum-sealed drainage bottle by squeezing the pinch clamp or rolling the wheel of the roller clamp toward the bottle. The vacuum in the bottle will be lost if the line is not closed completely.  6. Remove the access tip cover from the drainage line. Do not touch the end. Set it on a sterile surface.  7. Remove the catheter valve cap and throw it away.  8. Use an alcohol pad to clean the end of the catheter.  9. Insert the access tip into the catheter valve. Make sure the valve and access tip are securely connected. Listen for a click to confirm that they are connected.  10. Insert the T plunger to break the vacuum   seal on the drainage bottle.  11. Open the clamp on the drainage line.  12. Allow the catheter to drain. Keep the catheter and the drainage bottle below the level of your chest. There may be a one-way valve on the end of the tubing that will allow liquid and air to flow out of the catheter without letting air inside.  13. Drain the amount of fluid as told by your health care provider. It usually takes 5-15 minutes. Do not drain more than 1000 mL of fluid. You may feel a little discomfort while you are draining. If the pain is severe, stop draining and contact your health care provider.  14. After you finish draining the catheter, remove the drainage bottle tubing from the catheter.  15. Use a clean alcohol pad to  wipe the catheter tip.  16. Place a clean cap on the end of the catheter.  17. Use an alcohol pad to clean the skin around the catheter.  18. Allow the skin to air-dry.  19. Put the catheter pad on your skin. Curl the catheter into loops and place it on the pad. Do not place the catheter on your skin.  20. Replace the dressing over the catheter.  21. Discard the drainage bottle as instructed by your health care provider. Do not reuse the drainage bottle.    How to change your dressing  Change your dressing at least once a week, or more often if needed to keep the dressing dry. Be sure to change the dressing whenever it becomes moist. Your health care provider will tell you how often to change your dressing.  1. Wash your hands with soap and warm water. If soap and water are not available, use hand sanitizer.  2. Gently remove the old dressing. Avoid using scissors to remove the dressing. Sharp objects may damage the catheter.  3. Wash the skin around the insertion site with mild, fragrance-free soap and warm water. Rinse well, then pat the area dry with a clean cloth.  4. Check the skin around the catheter for signs of infection. Check for:  ? Skin breakdown.  ? Redness, swelling, or pain.  ? Fluid or blood.  ? Warmth.  ? Pus or a bad smell.  5. If your catheter was stitched (sutured) to your skin, look at the suture to make sure it is still anchored in your skin.  6. Do not apply creams, ointments, or alcohol to the area. Let your skin air-dry completely before you apply a new dressing.  7. Curl the catheter into loops and place it on the sterile catheter pad. Do not place the catheter on your skin.  8. If you do not have a pad, use a clean dressing. Slide the dressing under the disk that holds the drainage catheter in place.  9. Use gauze to cover the catheter and the catheter pad. The catheter should rest on the pad or dressing, not on your skin.  10. Tape the dressing to your skin. You may be instructed to use  an adhesive dressing covering instead of gauze and tape.  11. Wash your hands with soap and warm water. If soap and water are not available, use hand sanitizer.    General recommendations  · Always wash your hands with soap and warm water before and after caring for your catheter and drainage bottle. Use a mild, fragrance-free soap. If soap and water are not available, use hand sanitizer.  · Always make sure there are no   leaks in the catheter or drainage bottle.  · Each time you drain the catheter, note the color and amount of fluid.  · Do not touch the tip of the catheter or the drainage bottle tubing.  · Do not reuse drainage bottles.  · Do not take baths, swim, or use a hot tub until your health care provider approves. Ask your health care provider if you may take showers. You may only be allowed to take sponge baths.  · Take deep breaths regularly, followed by a cough. Doing this can help to prevent lung infection.  Contact a health care provider if:  · You have any questions about caring for your catheter or drainage bottle.  · You still have pain at the catheter insertion site more than 2 days after your procedure.  · You have pain while draining your catheter.  · Your catheter becomes bent, twisted, or cracked.  · The connection between the catheter and the collection bottle becomes loose.  · You have any of these around your catheter insertion site or coming from it:  ? Skin breakdown.  ? Redness, swelling, or pain.  ? Fluid or blood.  ? Warmth.  ? Pus or a bad smell.  Get help right away if:  · You have a fever or chills.  · You have chest pain.  · You have dizziness or shortness of breath.  · You have severe redness, swelling, or pain at your catheter insertion site.  · The catheter comes out.  · The catheter is blocked or clogged.  Summary  · An indwelling pleural catheter is a thin, flexible tube that is inserted under your skin and into your chest. The catheter drains excess fluid that collects in the  area between the chest wall and the lungs (pleural space).  · It is important to follow instructions from your health care provider about how to drain and care for your catheter.  · Do not touch the tip of the catheter or the drainage bottle tubing.  · Always wash your hands with soap and water before and after caring for your catheter and drainage bottle. If soap and water are not available, use hand sanitizer.  This information is not intended to replace advice given to you by your health care provider. Make sure you discuss any questions you have with your health care provider.  Document Released: 09/12/2016 Document Revised: 09/12/2016 Document Reviewed: 09/12/2016  [employer]vier Interactive Patient Education © 2018 [employer]

## 2018-02-09 NOTE — Sedation Documentation (Signed)
Patient is resting comfortably. 

## 2018-02-09 NOTE — Procedures (Signed)
  Procedure: RLQ tunneled PleurX peritoneal drain , paracentesis EBL:   minimal Complications:  none immediate  See full dictation in [facility].  Dillard Cannon MD Main # [phone] Pager  [phone]

## 2018-02-12 DIAGNOSIS — I10 Essential (primary) hypertension: Secondary | ICD-10-CM | POA: Diagnosis not present

## 2018-02-12 DIAGNOSIS — C787 Secondary malignant neoplasm of liver and intrahepatic bile duct: Secondary | ICD-10-CM | POA: Diagnosis not present

## 2018-02-12 DIAGNOSIS — C187 Malignant neoplasm of sigmoid colon: Secondary | ICD-10-CM | POA: Diagnosis not present

## 2018-02-12 DIAGNOSIS — Z452 Encounter for adjustment and management of vascular access device: Secondary | ICD-10-CM | POA: Diagnosis not present

## 2018-02-12 DIAGNOSIS — Z438 Encounter for attention to other artificial openings: Secondary | ICD-10-CM | POA: Diagnosis not present

## 2018-02-15 DIAGNOSIS — Z438 Encounter for attention to other artificial openings: Secondary | ICD-10-CM | POA: Diagnosis not present

## 2018-02-15 DIAGNOSIS — I10 Essential (primary) hypertension: Secondary | ICD-10-CM | POA: Diagnosis not present

## 2018-02-15 DIAGNOSIS — C187 Malignant neoplasm of sigmoid colon: Secondary | ICD-10-CM | POA: Diagnosis not present

## 2018-02-15 DIAGNOSIS — Z452 Encounter for adjustment and management of vascular access device: Secondary | ICD-10-CM | POA: Diagnosis not present

## 2018-02-15 DIAGNOSIS — C787 Secondary malignant neoplasm of liver and intrahepatic bile duct: Secondary | ICD-10-CM | POA: Diagnosis not present

## 2018-02-16 DIAGNOSIS — C187 Malignant neoplasm of sigmoid colon: Secondary | ICD-10-CM | POA: Diagnosis not present

## 2018-02-16 DIAGNOSIS — Z438 Encounter for attention to other artificial openings: Secondary | ICD-10-CM | POA: Diagnosis not present

## 2018-02-16 DIAGNOSIS — I10 Essential (primary) hypertension: Secondary | ICD-10-CM | POA: Diagnosis not present

## 2018-02-16 DIAGNOSIS — C787 Secondary malignant neoplasm of liver and intrahepatic bile duct: Secondary | ICD-10-CM | POA: Diagnosis not present

## 2018-02-16 DIAGNOSIS — Z452 Encounter for adjustment and management of vascular access device: Secondary | ICD-10-CM | POA: Diagnosis not present

## 2018-02-19 DIAGNOSIS — Z452 Encounter for adjustment and management of vascular access device: Secondary | ICD-10-CM | POA: Diagnosis not present

## 2018-02-19 DIAGNOSIS — Z438 Encounter for attention to other artificial openings: Secondary | ICD-10-CM | POA: Diagnosis not present

## 2018-02-19 DIAGNOSIS — C187 Malignant neoplasm of sigmoid colon: Secondary | ICD-10-CM | POA: Diagnosis not present

## 2018-02-19 DIAGNOSIS — C787 Secondary malignant neoplasm of liver and intrahepatic bile duct: Secondary | ICD-10-CM | POA: Diagnosis not present

## 2018-02-19 DIAGNOSIS — I10 Essential (primary) hypertension: Secondary | ICD-10-CM | POA: Diagnosis not present

## 2018-02-23 DIAGNOSIS — C787 Secondary malignant neoplasm of liver and intrahepatic bile duct: Secondary | ICD-10-CM | POA: Diagnosis not present

## 2018-02-23 DIAGNOSIS — Z452 Encounter for adjustment and management of vascular access device: Secondary | ICD-10-CM | POA: Diagnosis not present

## 2018-02-23 DIAGNOSIS — C187 Malignant neoplasm of sigmoid colon: Secondary | ICD-10-CM | POA: Diagnosis not present

## 2018-02-23 DIAGNOSIS — I10 Essential (primary) hypertension: Secondary | ICD-10-CM | POA: Diagnosis not present

## 2018-02-23 DIAGNOSIS — Z438 Encounter for attention to other artificial openings: Secondary | ICD-10-CM | POA: Diagnosis not present

## 2018-02-26 DIAGNOSIS — Z452 Encounter for adjustment and management of vascular access device: Secondary | ICD-10-CM | POA: Diagnosis not present

## 2018-02-26 DIAGNOSIS — Z438 Encounter for attention to other artificial openings: Secondary | ICD-10-CM | POA: Diagnosis not present

## 2018-02-26 DIAGNOSIS — C187 Malignant neoplasm of sigmoid colon: Secondary | ICD-10-CM | POA: Diagnosis not present

## 2018-02-26 DIAGNOSIS — I10 Essential (primary) hypertension: Secondary | ICD-10-CM | POA: Diagnosis not present

## 2018-02-26 DIAGNOSIS — C787 Secondary malignant neoplasm of liver and intrahepatic bile duct: Secondary | ICD-10-CM | POA: Diagnosis not present

## 2018-02-27 DIAGNOSIS — Z452 Encounter for adjustment and management of vascular access device: Secondary | ICD-10-CM | POA: Diagnosis not present

## 2018-02-27 DIAGNOSIS — Z438 Encounter for attention to other artificial openings: Secondary | ICD-10-CM | POA: Diagnosis not present

## 2018-02-27 DIAGNOSIS — I10 Essential (primary) hypertension: Secondary | ICD-10-CM | POA: Diagnosis not present

## 2018-02-27 DIAGNOSIS — C787 Secondary malignant neoplasm of liver and intrahepatic bile duct: Secondary | ICD-10-CM | POA: Diagnosis not present

## 2018-02-27 DIAGNOSIS — C187 Malignant neoplasm of sigmoid colon: Secondary | ICD-10-CM | POA: Diagnosis not present

## 2018-03-02 DIAGNOSIS — Z438 Encounter for attention to other artificial openings: Secondary | ICD-10-CM | POA: Diagnosis not present

## 2018-03-02 DIAGNOSIS — I10 Essential (primary) hypertension: Secondary | ICD-10-CM | POA: Diagnosis not present

## 2018-03-02 DIAGNOSIS — C187 Malignant neoplasm of sigmoid colon: Secondary | ICD-10-CM | POA: Diagnosis not present

## 2018-03-02 DIAGNOSIS — C787 Secondary malignant neoplasm of liver and intrahepatic bile duct: Secondary | ICD-10-CM | POA: Diagnosis not present

## 2018-03-02 DIAGNOSIS — Z452 Encounter for adjustment and management of vascular access device: Secondary | ICD-10-CM | POA: Diagnosis not present

## 2018-03-03 DIAGNOSIS — I10 Essential (primary) hypertension: Secondary | ICD-10-CM | POA: Diagnosis not present

## 2018-03-03 DIAGNOSIS — C187 Malignant neoplasm of sigmoid colon: Secondary | ICD-10-CM | POA: Diagnosis not present

## 2018-03-03 DIAGNOSIS — C787 Secondary malignant neoplasm of liver and intrahepatic bile duct: Secondary | ICD-10-CM | POA: Diagnosis not present

## 2018-03-03 DIAGNOSIS — Z438 Encounter for attention to other artificial openings: Secondary | ICD-10-CM | POA: Diagnosis not present

## 2018-03-03 DIAGNOSIS — Z452 Encounter for adjustment and management of vascular access device: Secondary | ICD-10-CM | POA: Diagnosis not present

## 2018-03-05 DIAGNOSIS — Z452 Encounter for adjustment and management of vascular access device: Secondary | ICD-10-CM | POA: Diagnosis not present

## 2018-03-05 DIAGNOSIS — C787 Secondary malignant neoplasm of liver and intrahepatic bile duct: Secondary | ICD-10-CM | POA: Diagnosis not present

## 2018-03-05 DIAGNOSIS — I10 Essential (primary) hypertension: Secondary | ICD-10-CM | POA: Diagnosis not present

## 2018-03-05 DIAGNOSIS — Z438 Encounter for attention to other artificial openings: Secondary | ICD-10-CM | POA: Diagnosis not present

## 2018-03-05 DIAGNOSIS — C187 Malignant neoplasm of sigmoid colon: Secondary | ICD-10-CM | POA: Diagnosis not present

## 2018-03-08 DIAGNOSIS — C787 Secondary malignant neoplasm of liver and intrahepatic bile duct: Secondary | ICD-10-CM | POA: Diagnosis not present

## 2018-03-08 DIAGNOSIS — Z438 Encounter for attention to other artificial openings: Secondary | ICD-10-CM | POA: Diagnosis not present

## 2018-03-08 DIAGNOSIS — I10 Essential (primary) hypertension: Secondary | ICD-10-CM | POA: Diagnosis not present

## 2018-03-08 DIAGNOSIS — C187 Malignant neoplasm of sigmoid colon: Secondary | ICD-10-CM | POA: Diagnosis not present

## 2018-03-08 DIAGNOSIS — Z452 Encounter for adjustment and management of vascular access device: Secondary | ICD-10-CM | POA: Diagnosis not present

## 2018-03-09 DIAGNOSIS — C187 Malignant neoplasm of sigmoid colon: Secondary | ICD-10-CM | POA: Diagnosis not present

## 2018-03-09 DIAGNOSIS — I10 Essential (primary) hypertension: Secondary | ICD-10-CM | POA: Diagnosis not present

## 2018-03-09 DIAGNOSIS — Z452 Encounter for adjustment and management of vascular access device: Secondary | ICD-10-CM | POA: Diagnosis not present

## 2018-03-09 DIAGNOSIS — Z438 Encounter for attention to other artificial openings: Secondary | ICD-10-CM | POA: Diagnosis not present

## 2018-03-09 DIAGNOSIS — C787 Secondary malignant neoplasm of liver and intrahepatic bile duct: Secondary | ICD-10-CM | POA: Diagnosis not present

## 2018-03-12 DIAGNOSIS — C787 Secondary malignant neoplasm of liver and intrahepatic bile duct: Secondary | ICD-10-CM | POA: Diagnosis not present

## 2018-03-12 DIAGNOSIS — Z452 Encounter for adjustment and management of vascular access device: Secondary | ICD-10-CM | POA: Diagnosis not present

## 2018-03-12 DIAGNOSIS — I10 Essential (primary) hypertension: Secondary | ICD-10-CM | POA: Diagnosis not present

## 2018-03-12 DIAGNOSIS — Z438 Encounter for attention to other artificial openings: Secondary | ICD-10-CM | POA: Diagnosis not present

## 2018-03-12 DIAGNOSIS — C187 Malignant neoplasm of sigmoid colon: Secondary | ICD-10-CM | POA: Diagnosis not present

## 2018-03-16 DIAGNOSIS — C787 Secondary malignant neoplasm of liver and intrahepatic bile duct: Secondary | ICD-10-CM | POA: Diagnosis not present

## 2018-03-16 DIAGNOSIS — Z452 Encounter for adjustment and management of vascular access device: Secondary | ICD-10-CM | POA: Diagnosis not present

## 2018-03-16 DIAGNOSIS — C187 Malignant neoplasm of sigmoid colon: Secondary | ICD-10-CM | POA: Diagnosis not present

## 2018-03-16 DIAGNOSIS — Z438 Encounter for attention to other artificial openings: Secondary | ICD-10-CM | POA: Diagnosis not present

## 2018-03-16 DIAGNOSIS — I10 Essential (primary) hypertension: Secondary | ICD-10-CM | POA: Diagnosis not present

## 2018-03-18 DIAGNOSIS — I10 Essential (primary) hypertension: Secondary | ICD-10-CM | POA: Diagnosis not present

## 2018-03-18 DIAGNOSIS — Z452 Encounter for adjustment and management of vascular access device: Secondary | ICD-10-CM | POA: Diagnosis not present

## 2018-03-18 DIAGNOSIS — C187 Malignant neoplasm of sigmoid colon: Secondary | ICD-10-CM | POA: Diagnosis not present

## 2018-03-18 DIAGNOSIS — Z438 Encounter for attention to other artificial openings: Secondary | ICD-10-CM | POA: Diagnosis not present

## 2018-03-18 DIAGNOSIS — C787 Secondary malignant neoplasm of liver and intrahepatic bile duct: Secondary | ICD-10-CM | POA: Diagnosis not present

## 2018-03-19 DIAGNOSIS — Z452 Encounter for adjustment and management of vascular access device: Secondary | ICD-10-CM | POA: Diagnosis not present

## 2018-03-19 DIAGNOSIS — C187 Malignant neoplasm of sigmoid colon: Secondary | ICD-10-CM | POA: Diagnosis not present

## 2018-03-19 DIAGNOSIS — I10 Essential (primary) hypertension: Secondary | ICD-10-CM | POA: Diagnosis not present

## 2018-03-19 DIAGNOSIS — C787 Secondary malignant neoplasm of liver and intrahepatic bile duct: Secondary | ICD-10-CM | POA: Diagnosis not present

## 2018-03-19 DIAGNOSIS — Z438 Encounter for attention to other artificial openings: Secondary | ICD-10-CM | POA: Diagnosis not present

## 2018-03-23 DIAGNOSIS — C787 Secondary malignant neoplasm of liver and intrahepatic bile duct: Secondary | ICD-10-CM | POA: Diagnosis not present

## 2018-03-23 DIAGNOSIS — Z452 Encounter for adjustment and management of vascular access device: Secondary | ICD-10-CM | POA: Diagnosis not present

## 2018-03-23 DIAGNOSIS — Z438 Encounter for attention to other artificial openings: Secondary | ICD-10-CM | POA: Diagnosis not present

## 2018-03-23 DIAGNOSIS — C187 Malignant neoplasm of sigmoid colon: Secondary | ICD-10-CM | POA: Diagnosis not present

## 2018-03-23 DIAGNOSIS — I10 Essential (primary) hypertension: Secondary | ICD-10-CM | POA: Diagnosis not present

## 2018-03-26 DIAGNOSIS — Z452 Encounter for adjustment and management of vascular access device: Secondary | ICD-10-CM | POA: Diagnosis not present

## 2018-03-26 DIAGNOSIS — C187 Malignant neoplasm of sigmoid colon: Secondary | ICD-10-CM | POA: Diagnosis not present

## 2018-03-26 DIAGNOSIS — C787 Secondary malignant neoplasm of liver and intrahepatic bile duct: Secondary | ICD-10-CM | POA: Diagnosis not present

## 2018-03-26 DIAGNOSIS — I10 Essential (primary) hypertension: Secondary | ICD-10-CM | POA: Diagnosis not present

## 2018-03-26 DIAGNOSIS — Z438 Encounter for attention to other artificial openings: Secondary | ICD-10-CM | POA: Diagnosis not present

## 2018-03-30 DIAGNOSIS — I10 Essential (primary) hypertension: Secondary | ICD-10-CM | POA: Diagnosis not present

## 2018-03-30 DIAGNOSIS — C187 Malignant neoplasm of sigmoid colon: Secondary | ICD-10-CM | POA: Diagnosis not present

## 2018-03-30 DIAGNOSIS — C787 Secondary malignant neoplasm of liver and intrahepatic bile duct: Secondary | ICD-10-CM | POA: Diagnosis not present

## 2018-03-30 DIAGNOSIS — Z438 Encounter for attention to other artificial openings: Secondary | ICD-10-CM | POA: Diagnosis not present

## 2018-03-30 DIAGNOSIS — Z452 Encounter for adjustment and management of vascular access device: Secondary | ICD-10-CM | POA: Diagnosis not present

## 2018-04-02 DIAGNOSIS — I10 Essential (primary) hypertension: Secondary | ICD-10-CM | POA: Diagnosis not present

## 2018-04-02 DIAGNOSIS — C787 Secondary malignant neoplasm of liver and intrahepatic bile duct: Secondary | ICD-10-CM | POA: Diagnosis not present

## 2018-04-02 DIAGNOSIS — Z452 Encounter for adjustment and management of vascular access device: Secondary | ICD-10-CM | POA: Diagnosis not present

## 2018-04-02 DIAGNOSIS — Z438 Encounter for attention to other artificial openings: Secondary | ICD-10-CM | POA: Diagnosis not present

## 2018-04-02 DIAGNOSIS — C187 Malignant neoplasm of sigmoid colon: Secondary | ICD-10-CM | POA: Diagnosis not present

## 2018-05-03 DEATH — deceased
